# Patient Record
Sex: Male | Born: 1950
Health system: Southern US, Community
[De-identification: ages and names within clinical notes are randomized; demographics above are authoritative.]

## PROBLEM LIST (undated history)

## (undated) DIAGNOSIS — R001 Bradycardia, unspecified: Secondary | ICD-10-CM

## (undated) DIAGNOSIS — Z973 Presence of spectacles and contact lenses: Secondary | ICD-10-CM

## (undated) DIAGNOSIS — I10 Essential (primary) hypertension: Secondary | ICD-10-CM

## (undated) DIAGNOSIS — Z87828 Personal history of other (healed) physical injury and trauma: Secondary | ICD-10-CM

## (undated) DIAGNOSIS — Z951 Presence of aortocoronary bypass graft: Secondary | ICD-10-CM

## (undated) DIAGNOSIS — Z8249 Family history of ischemic heart disease and other diseases of the circulatory system: Secondary | ICD-10-CM

## (undated) DIAGNOSIS — I499 Cardiac arrhythmia, unspecified: Secondary | ICD-10-CM

## (undated) DIAGNOSIS — E7800 Pure hypercholesterolemia, unspecified: Secondary | ICD-10-CM

## (undated) DIAGNOSIS — Z974 Presence of external hearing-aid: Secondary | ICD-10-CM

## (undated) DIAGNOSIS — N201 Calculus of ureter: Secondary | ICD-10-CM

## (undated) DIAGNOSIS — N200 Calculus of kidney: Secondary | ICD-10-CM

## (undated) DIAGNOSIS — I2510 Atherosclerotic heart disease of native coronary artery without angina pectoris: Secondary | ICD-10-CM

## (undated) DIAGNOSIS — E119 Type 2 diabetes mellitus without complications: Secondary | ICD-10-CM

## (undated) DIAGNOSIS — Z87442 Personal history of urinary calculi: Secondary | ICD-10-CM

## (undated) HISTORY — DX: Atherosclerotic heart disease of native coronary artery without angina pectoris: I25.10

## (undated) HISTORY — PX: LAPAROSCOPIC APPENDECTOMY: SUR753

## (undated) HISTORY — DX: Family history of ischemic heart disease and other diseases of the circulatory system: Z82.49

## (undated) NOTE — *Deleted (*Deleted)
Called by central tele at 2003 becaseu PT had a run of about 20 VTAC. RN assessed pt.  Pt stated he felt no different.  Pt laying in bed comfortable.  Pt stated he had just rolled over a few seconds ago.

---
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
## 1999-10-20 ENCOUNTER — Ambulatory Visit (HOSPITAL_COMMUNITY): Admission: RE | Admit: 1999-10-20 | Discharge: 1999-10-20 | Payer: Self-pay | Admitting: Internal Medicine

## 2000-04-02 ENCOUNTER — Encounter: Payer: Self-pay | Admitting: *Deleted

## 2000-04-02 ENCOUNTER — Encounter: Admission: RE | Admit: 2000-04-02 | Discharge: 2000-04-02 | Payer: Self-pay | Admitting: *Deleted

## 2000-08-07 ENCOUNTER — Encounter: Admission: RE | Admit: 2000-08-07 | Discharge: 2000-08-07 | Payer: Self-pay

## 2001-06-19 ENCOUNTER — Ambulatory Visit (HOSPITAL_COMMUNITY): Admission: RE | Admit: 2001-06-19 | Discharge: 2001-06-19 | Payer: Self-pay | Admitting: Gastroenterology

## 2004-02-14 HISTORY — PX: UMBILICAL HERNIA REPAIR: SHX196

## 2005-02-13 HISTORY — PX: VARICOSE VEIN SURGERY: SHX832

## 2005-02-15 ENCOUNTER — Encounter: Admission: RE | Admit: 2005-02-15 | Discharge: 2005-02-15 | Payer: Self-pay | Admitting: Internal Medicine

## 2005-02-21 ENCOUNTER — Encounter: Admission: RE | Admit: 2005-02-21 | Discharge: 2005-02-21 | Payer: Self-pay | Admitting: Internal Medicine

## 2005-02-28 ENCOUNTER — Encounter: Admission: RE | Admit: 2005-02-28 | Discharge: 2005-02-28 | Payer: Self-pay | Admitting: Internal Medicine

## 2005-03-21 ENCOUNTER — Encounter: Admission: RE | Admit: 2005-03-21 | Discharge: 2005-03-21 | Payer: Self-pay | Admitting: Internal Medicine

## 2005-08-31 ENCOUNTER — Encounter: Admission: RE | Admit: 2005-08-31 | Discharge: 2005-08-31 | Payer: Self-pay | Admitting: Internal Medicine

## 2005-11-18 ENCOUNTER — Observation Stay (HOSPITAL_COMMUNITY): Admission: EM | Admit: 2005-11-18 | Discharge: 2005-11-18 | Payer: Self-pay | Admitting: Emergency Medicine

## 2005-11-18 ENCOUNTER — Encounter (INDEPENDENT_AMBULATORY_CARE_PROVIDER_SITE_OTHER): Payer: Self-pay | Admitting: Specialist

## 2007-03-16 ENCOUNTER — Emergency Department (HOSPITAL_COMMUNITY): Admission: EM | Admit: 2007-03-16 | Discharge: 2007-03-16 | Payer: Self-pay | Admitting: Emergency Medicine

## 2009-10-18 ENCOUNTER — Emergency Department (HOSPITAL_COMMUNITY): Admission: EM | Admit: 2009-10-18 | Discharge: 2009-10-18 | Payer: Self-pay | Admitting: Emergency Medicine
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
## 2010-07-01 NOTE — Procedures (Signed)
[place]  Patient:    JAXTON, CASALE Visit Number: [id number] MRN: [id number]          Service Type: END Location: ENDO Attending Physician:  Rich Brave Dictated by:   Florencia Reasons, M.D. Proc. Date: 06/19/01 Admit Date:  06/19/2001   CC:         Gaspar Garbe, M.D.   Procedure Report  PROCEDURE:  Colonoscopy.  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  FINDINGS:  Normal exam to the cecum.  DESCRIPTION OF PROCEDURE:  The nature, purpose, and risks of the procedure had been discussed with the patient, who provided written consent.  Sedation was fentanyl 100 mcg and Versed 10 mg IV without arrhythmias or desaturation. Digital exam showed a prolapsed internal hemorrhoid or external hemorrhoid, and a normal prostate.  The Olympus adult video colonoscope was advanced with some looping to the cecum, and pullback was then performed.  The quality of the prep was excellent, so it was felt that all areas were well-seen.  This was a normal examination.  No polyps, cancer, colitis, vascular malformations, or diverticulosis were noted.  No biopsies were obtained.  The patient tolerated the procedure well, and there were no apparent complications.  IMPRESSION:  Normal screening colonoscopy.  PLAN:  Consider follow-up colonoscopy in five years for screening purposes, versus sigmoidoscopic evaluation. Dictated by:   Florencia Reasons, M.D. Attending Physician:  Rich Brave DD:  06/19/01 TD:  06/20/01 Job: [id number]

## 2010-07-01 NOTE — Op Note (Signed)
Taylor [name], Taylor [name]              ACCOUNT NO.:  [id number]   MEDICAL RECORD NO.:  [id number]          PATIENT TYPE:  INP   LOCATION:  2550                         FACILITY:  [facility]   PHYSICIAN:  Cherylynn Ridges, M.D.    DATE OF BIRTH:  [birth year]   DATE OF PROCEDURE:  11/18/2005  DATE OF DISCHARGE:                                 OPERATIVE REPORT   PREOPERATIVE DIAGNOSIS:  Acute appendicitis.   POSTOPERATIVE DIAGNOSIS:  Acute appendicitis.   PROCEDURE:  Laparoscopic appendectomy.   SURGEON:  Marta Lamas. Lindie Spruce, M.D.   ANESTHESIA:  General endotracheal.   ESTIMATED BLOOD LOSS:  Less than 20 cubic centimeters.   COMPLICATIONS:  None.   CONDITION:  Stable.   FINDINGS:  Acute appendicitis without perforation and minimal separation.   INDICATIONS FOR OPERATION:  The patient is a 60 year old gentleman with  abdominal pain localized to the right lower quadrant and a CT showing acute  appendicitis.   OPERATION:  The patient was taken to the operating room and placed on the  table in the supine position.  After an adequate endotracheal anesthetic was  administered, he was prepped and draped in usual sterile manner, exposing  the midline and right upper quadrant.   The patient was noted to have a supraumbilical umbilical hernia.  A  longitudinal incision was made at the upper portion of the umbilicus and  taken down to the subcutaneous where the umbilical hernia defect was noted.  We dissected out this defect and then subsequently reduced back into the  preperitoneal plane.  We placed Kocher clamps upon the open area edges of  the hernia defect then bluntly dissected into the peritoneal cavity using  Kelly clamp.  We placed a pursestring suture of 0-Vicryl around the hernia  defect and then subsequently passed the Hassan cannula into the peritoneal  cavity.  Once the Roseanne Reno was freely in the peritoneal cavity, we insufflated  carbon dioxide gas into the peritoneal cavity up to maximal  pressure of 15  mmHg.  Once this was done, a right costal margin 5 mm cannula and a  suprapubic 12 mm cannula passed under direct vision into the peritoneal  cavity.  The patient was placed in Trendelenburg and the left-side was  tilted down and dissection begun.   The acutely inflamed and enlarged appendix was noted in the right lower  quadrant.  We were able to dissect it out at the base the cecum and free at  a window between the mesoappendix and the appendix using a dissector and  electrocautery.  We subsequently passed an Endo-GIA 3.5 mm closure stapler  along the base of the cecum and the appendix, transecting it.  We then used  a 2.5 mm closure Endo-GIA stapler across the mesoappendix, stapling it with  minimal bleeding.  We retrieved the appendix using an EndoCatch bag from the  suprapubic site with minimal difficulty.  We irrigated with less than a  liter of saline solution and found there to be adequate hemostasis at the  staple line and good apposition of tissues.  Once irrigated, we aspirated  all  fluid and gas from above the liver and pelvis, removed all cannulas, and  then closed the supraumbilical fascia site at the site hernia using the  pursestring suture of 0-Vicryl which was in place to secure the [facility]  cannula.  Once this was done, 0.25% Marcaine was injected all sites and the  skin was closed using running subcuticular stitch of 5-0 Vicryl.  A sterile  dressing was applied.  All needle counts, sponge counts, and instrument  counts were correct.      Cherylynn Ridges, M.D.  Electronically Signed     JOW/MEDQ  D:  11/18/2005  T:  11/19/2005  Job:  [id number]   cc:   Gaspar Garbe, M.D.

## 2010-11-03 LAB — I-STAT 8, (EC8 V) (CONVERTED LAB)
Acid-base deficit: 1
BUN: 24 — ABNORMAL HIGH
Bicarbonate: 24.9 — ABNORMAL HIGH
Chloride: 106
Glucose, Bld: 144 — ABNORMAL HIGH
HCT: 43
Hemoglobin: 14.6
Operator id: 151321
Potassium: 4.3
Sodium: 137
TCO2: 26
pCO2, Ven: 42.9 — ABNORMAL LOW
pH, Ven: 7.372 — ABNORMAL HIGH

## 2010-11-03 LAB — URINALYSIS, ROUTINE W REFLEX MICROSCOPIC
Bilirubin Urine: NEGATIVE
Glucose, UA: 100 — AB
Ketones, ur: NEGATIVE
Leukocytes, UA: NEGATIVE
Nitrite: NEGATIVE
Protein, ur: 30 — AB
Specific Gravity, Urine: 1.028
Urobilinogen, UA: 1
pH: 5

## 2010-11-03 LAB — URINE MICROSCOPIC-ADD ON

## 2010-11-03 LAB — POCT I-STAT CREATININE
Creatinine, Ser: 1.3
Operator id: 151321

## 2011-05-08 ENCOUNTER — Other Ambulatory Visit: Payer: Self-pay | Admitting: Internal Medicine

## 2011-05-08 DIAGNOSIS — I83811 Varicose veins of right lower extremities with pain: Secondary | ICD-10-CM

## 2011-05-23 ENCOUNTER — Ambulatory Visit
Admission: RE | Admit: 2011-05-23 | Discharge: 2011-05-23 | Disposition: A | Discharge status: Home / Self Care | Payer: BC Managed Care – PPO | Source: Ambulatory Visit | Attending: Internal Medicine | Admitting: Internal Medicine

## 2011-05-23 DIAGNOSIS — I83811 Varicose veins of right lower extremities with pain: Secondary | ICD-10-CM

## 2011-05-23 HISTORY — DX: Essential (primary) hypertension: I10

## 2011-05-23 HISTORY — DX: Pure hypercholesterolemia, unspecified: E78.00

## 2011-05-23 NOTE — Consult Note (Signed)
Reason for Consult:Right lower extremity swelling. Referring Physician: Rilee, Taylor [name] is an 61 y.o. male.  HPI: Known to our service from previous endovascular laser ablation of the refluxing left greater saphenous vein to treat symptomatic varicose veins, good response.  Now with increasing right lower extremity calf and ankle edema which increases over the course of the day.  No history of venous ulceration, DVT, thrombophlebitis, or PE.  No family history of varicose or spider veins.  Taylor [name] is using compression hose.  Taylor [name] works at [employer] full time, 8 hours day.  Not using pain medications.  There is minimal relief with leg elevation.  No current restrictions on activities of daily living or exercise.  Past Medical History  Diagnosis Date  . Diabetes mellitus   . Hypertension   . Hypercholesterolemia     Past Surgical History  Procedure Date  . Orchiectomy 1965  . Appendectomy   . Hernia repair   . Varicose vein surgery 2007    left endovascular laser ablation    Family History  Problem Relation Age of Onset  . Heart disease Other   . Stroke Other     Social History:  reports that Taylor [name] has never smoked. Taylor [name] has never used smokeless tobacco. Taylor [name] reports that Taylor [name] does not drink alcohol or use illicit drugs.  Allergies: No Known Allergies  Medications: I have reviewed the patient's current medications.  No results found for this or any previous visit (from the past 48 hour(s)).  US Venous Img Lower Unilateral Right  05/23/2011  *RADIOLOGY REPORT*  Clinical data:  Previous endovascular laser ablation of symptomatic left lower extremity varicose veins.  New right leg swelling.  RIGHTLOWER EXTREMITY VENOUS DOPPLER ULTRASOUND  Technique:  Gray-scale sonography with compression as well as color and duplex Doppler ultrasound were performed to evaluate the deep venous system from the level of the common femoral vein through the popliteal and proximal calf  veins. Standing gray-scale sonography and duplex Doppler evaluation of the superficial venous system with augmentation was performed.  Findings:  The lower extremity deep venous system demonstrates normal compressibility, phasicity, and augmentation. No evidence of DVT.  There is valvular incompetence and reflux in the greater saphenous vein   from the saphenofemoral junction to the distal thigh.  The vein is not particularly dilated however.  The short saphenous vein is mildly dilated but demonstrates no valvular incompetence or reflux.  There are a few prominent tributaries from the short saphenous vein.  Subcutaneous edema is noted in the lower calf.  IMPRESSION  1.  Valvular incompetence and reflux in the right greater saphenous vein above the knee. 2.  Normal deep venous system.  Original Report Authenticated By: Osa Craver, M.D.    Review of Systems  HENT: Positive for tinnitus.   Respiratory: Negative.   Gastrointestinal: Negative.   Neurological: Negative.    5'11" 210lbs   Temp   Min: 98.1 F (36.7 C)   Max: 98.1 F (36.7 C)   Pulse Rate   Min: 62    Max: 62    Resp   Min: 15    Max: 15    BP: Systolic   Min: ! 149 mmHg   Max: ! 149 mmHg   BP: Diastolic   Min: 75 mmHg   Max: 75 mmHg   SpO2   Min: 96 %   Max: 96 %       Physical Exam  Constitutional: Taylor [name] is oriented  to person, place, and time. Taylor [name] appears well-developed and well-nourished. No distress.  HENT:  Head: Normocephalic.  Neck: No thyromegaly present.  Cardiovascular: Intact distal pulses.   Pulses:      Dorsalis pedis pulses are 3+ on the right side.       Posterior tibial pulses are 3+ on the right side.  Respiratory: Effort normal. No stridor. No respiratory distress.  GI: Taylor [name] exhibits no distension.  Musculoskeletal:       Right ankle: Taylor [name] exhibits swelling. Taylor [name] exhibits no ecchymosis, no deformity and normal pulse. no tenderness.  Neurological: Taylor [name] is alert and oriented to person, place, and  time. Coordination normal.  Skin: Skin is warm and dry. No ecchymosis, no lesion and no rash noted. Taylor [name] is not diaphoretic. No cyanosis or erythema. No pallor. Nails show no clubbing.  Psychiatric: Taylor [name] has a normal mood and affect. His behavior is normal. Judgment and thought content normal.    Assessment/Plan: My impression is that the reflux in the right greater saphenous vein  represents early presentation of the same sort of issues Taylor [name] had on the left, mainly progressive valvular incompetence, reflux, and symptomatic varicose veins with activity restrictions. As Taylor [name] is having early symptomatology I think it would be medically appropriate to go ahead and   approach this with endovascular ablation to prevent further exacerbation of symptoms.  We reviewed the endovascular laser ablation technique, anticipated benefits, possible risks and side effects, and alternatives.  Taylor [name] seemed to understand, and was motivated to proceed.  Therefore, we can set this up at his convenience pending carrier approval.  In the meantime, I have given him a prescription for new thigh-high graduated compression hose.  Taylor [name],Taylor [name] 05/23/2011, 9:49 AM

## 2011-05-24 ENCOUNTER — Telehealth: Payer: Self-pay | Admitting: Emergency Medicine

## 2011-05-24 NOTE — Telephone Encounter (Signed)
LMOVM FOR PT TO CALL BACK RE: INS. RESPONSE ON VARICOSE VEINS TREATMENT.   05-25-11- PT CALLED BACK AT 9:12AM AND WE WILL SET UP F/U APPT FOR EARLY AUG. FOR F/U CONSERVATIVE TX FOR VEINS THEN FILE W/ INS.

## 2011-08-29 ENCOUNTER — Other Ambulatory Visit: Payer: Self-pay | Admitting: Interventional Radiology

## 2011-08-29 DIAGNOSIS — I83899 Varicose veins of unspecified lower extremities with other complications: Secondary | ICD-10-CM

## 2011-09-13 ENCOUNTER — Other Ambulatory Visit: Payer: Self-pay | Admitting: Gastroenterology

## 2011-09-27 ENCOUNTER — Telehealth: Payer: Self-pay | Admitting: Emergency Medicine

## 2011-09-27 NOTE — Telephone Encounter (Signed)
CALLED PT TO SET UP F/U CONSERVATIVE TX FOR VARICOSE VEINS AND PT STATED THAT HIS LEG HAS BEEN DOING A LOT BETTER SINCE HE HAS BEEN WEARING THE COMPRESSION STOCKING AND HAS LESS SWELLING.  HE WILL CONTACT us IN THE FUTURE IF NEEDED OR IF SYMPTOMS ARISE.

## 2012-06-16 ENCOUNTER — Ambulatory Visit (INDEPENDENT_AMBULATORY_CARE_PROVIDER_SITE_OTHER): Payer: BC Managed Care – PPO | Admitting: Family Medicine

## 2012-06-16 ENCOUNTER — Ambulatory Visit: Payer: BC Managed Care – PPO

## 2012-06-16 VITALS — BP 169/80 | HR 54 | Temp 97.9°F | Resp 18 | Ht 71.0 in | Wt 216.0 lb

## 2012-06-16 DIAGNOSIS — M25475 Effusion, left foot: Secondary | ICD-10-CM

## 2012-06-16 DIAGNOSIS — M25519 Pain in unspecified shoulder: Secondary | ICD-10-CM

## 2012-06-16 DIAGNOSIS — M25473 Effusion, unspecified ankle: Secondary | ICD-10-CM

## 2012-06-16 DIAGNOSIS — S9032XA Contusion of left foot, initial encounter: Secondary | ICD-10-CM

## 2012-06-16 DIAGNOSIS — M25572 Pain in left ankle and joints of left foot: Secondary | ICD-10-CM

## 2012-06-16 DIAGNOSIS — I10 Essential (primary) hypertension: Secondary | ICD-10-CM

## 2012-06-16 DIAGNOSIS — M25476 Effusion, unspecified foot: Secondary | ICD-10-CM

## 2012-06-16 NOTE — Addendum Note (Signed)
Addended by: Neva Seat, Kiera Hussey R on: 06/16/2012 11:11 AM   Modules accepted: Level of Service

## 2012-06-16 NOTE — Progress Notes (Signed)
  Subjective:    Patient ID: Taylor [name], male    DOB: 28-Sep-1950, 62 y.o.   MRN: [id number]  HPI Taylor [name] is a 62 y.o. male Hx of HTN, DM2 - BP usually controlled.   L foot pain - top of L foot, unknown injury.  May have bumped it or dropped something on it.  No hx of gout. More sore. No f/c, min redness on top. Able to WB, no attempted tx's.   SH: works at [employer] - walk 8 hrs per day on concrete.   Review of Systems  Constitutional: Negative for fever and chills.  Respiratory: Negative for chest tightness and shortness of breath.   Cardiovascular: Negative for chest pain.  Musculoskeletal: Positive for myalgias, joint swelling and arthralgias.  Skin: Positive for color change (bruising into 2-3rd toes 1 week ago. ).  Neurological: Negative for weakness.       Objective:   Physical Exam  Vitals reviewed. Constitutional: He appears well-developed and well-nourished. No distress.  Cardiovascular: Normal rate, regular rhythm, normal heart sounds and intact distal pulses.   Pulmonary/Chest: Effort normal and breath sounds normal.  Musculoskeletal:       Left foot: He exhibits tenderness and swelling. He exhibits normal range of motion and normal capillary refill.       Feet:  Skin: Skin is warm, dry and intact.     [facility] reading (PRIMARY) by  Dr. Neva Seat - dorsal foot slight sts, no apparent fx.      Assessment & Plan:  Taylor [name] is a 62 y.o. male  Pain in joint, ankle and foot, left - Plan: DG Foot Complete Left  Swelling of foot joint, left - Plan: DG Foot Complete Left  Contusion, foot, left, initial encounter  HTN (hypertension)  L foot pain, swelling over proximal 3-4th metatarsals for past week. DDX contusion, vs. Stress injury, but in discussion has not changed activity. tylenol otc prn, or low dose advil if needed.   Post op shoe for 10-14 days, then recheck if pain not resolved. Relative rest, oow today (scheduled out next 2 days).  rtc  precautions.   HTN - prior controlled.   See below.   Patient Instructions  Post op shoe (or stiff hiking boot/work boot) for next 2 weeks, then recheck if not resolved.  Limit activity on this foot as able next 2 weeks. Return to the clinic or go to the nearest emergency room if any of your symptoms worsen or new symptoms occur.  Keep a record of your blood pressures outside of the office and follow up with your primary provider if remaining above 140/90.

## 2012-06-16 NOTE — Patient Instructions (Addendum)
Post op shoe (or stiff hiking boot/work boot) for next 2 weeks, then recheck if not resolved.  Limit activity on this foot as able next 2 weeks. Return to the clinic or go to the nearest emergency room if any of your symptoms worsen or new symptoms occur.  Keep a record of your blood pressures outside of the office and follow up with your primary provider if remaining above 140/90.

## 2012-07-02 ENCOUNTER — Other Ambulatory Visit: Payer: Self-pay | Admitting: Podiatry

## 2012-07-02 DIAGNOSIS — D492 Neoplasm of unspecified behavior of bone, soft tissue, and skin: Secondary | ICD-10-CM

## 2012-07-03 LAB — CREATININE, SERUM: Creat: 1.05 mg/dL (ref 0.50–1.35)

## 2012-07-05 ENCOUNTER — Ambulatory Visit
Admission: RE | Admit: 2012-07-05 | Discharge: 2012-07-05 | Disposition: A | Discharge status: Home / Self Care | Payer: BC Managed Care – PPO | Source: Ambulatory Visit | Attending: Podiatry | Admitting: Podiatry

## 2012-07-05 DIAGNOSIS — D492 Neoplasm of unspecified behavior of bone, soft tissue, and skin: Secondary | ICD-10-CM

## 2012-07-05 MED ORDER — GADOBENATE DIMEGLUMINE 529 MG/ML IV SOLN
10.0000 mL | Freq: Once | INTRAVENOUS | Status: AC | PRN
  Administered 2012-07-05: 10 mL via INTRAVENOUS

## 2013-08-04 ENCOUNTER — Emergency Department (HOSPITAL_COMMUNITY)
Admission: EM | Admit: 2013-08-04 | Discharge: 2013-08-04 | Disposition: A | Discharge status: Home / Self Care | Payer: Worker's Compensation | Attending: Emergency Medicine | Admitting: Emergency Medicine

## 2013-08-04 ENCOUNTER — Encounter (HOSPITAL_COMMUNITY): Payer: Self-pay | Admitting: Emergency Medicine

## 2013-08-04 ENCOUNTER — Emergency Department (HOSPITAL_COMMUNITY): Payer: Worker's Compensation

## 2013-08-04 DIAGNOSIS — Z7982 Long term (current) use of aspirin: Secondary | ICD-10-CM | POA: Diagnosis not present

## 2013-08-04 DIAGNOSIS — E119 Type 2 diabetes mellitus without complications: Secondary | ICD-10-CM | POA: Diagnosis not present

## 2013-08-04 DIAGNOSIS — E7800 Pure hypercholesterolemia, unspecified: Secondary | ICD-10-CM | POA: Insufficient documentation

## 2013-08-04 DIAGNOSIS — S0003XA Contusion of scalp, initial encounter: Secondary | ICD-10-CM | POA: Insufficient documentation

## 2013-08-04 DIAGNOSIS — Y9289 Other specified places as the place of occurrence of the external cause: Secondary | ICD-10-CM | POA: Diagnosis not present

## 2013-08-04 DIAGNOSIS — Z79899 Other long term (current) drug therapy: Secondary | ICD-10-CM | POA: Diagnosis not present

## 2013-08-04 DIAGNOSIS — S1093XA Contusion of unspecified part of neck, initial encounter: Secondary | ICD-10-CM

## 2013-08-04 DIAGNOSIS — W1809XA Striking against other object with subsequent fall, initial encounter: Secondary | ICD-10-CM | POA: Diagnosis not present

## 2013-08-04 DIAGNOSIS — S06339A Contusion and laceration of cerebrum, unspecified, with loss of consciousness of unspecified duration, initial encounter: Secondary | ICD-10-CM | POA: Diagnosis not present

## 2013-08-04 DIAGNOSIS — S0083XA Contusion of other part of head, initial encounter: Secondary | ICD-10-CM | POA: Diagnosis not present

## 2013-08-04 DIAGNOSIS — S0990XA Unspecified injury of head, initial encounter: Secondary | ICD-10-CM | POA: Diagnosis present

## 2013-08-04 DIAGNOSIS — S06331A Contusion and laceration of cerebrum, unspecified, with loss of consciousness of 30 minutes or less, initial encounter: Secondary | ICD-10-CM

## 2013-08-04 DIAGNOSIS — I10 Essential (primary) hypertension: Secondary | ICD-10-CM | POA: Diagnosis not present

## 2013-08-04 DIAGNOSIS — Y990 Civilian activity done for income or pay: Secondary | ICD-10-CM | POA: Diagnosis not present

## 2013-08-04 LAB — CBC WITH DIFFERENTIAL/PLATELET
Basophils Absolute: 0 10*3/uL (ref 0.0–0.1)
Basophils Relative: 0 % (ref 0–1)
Eosinophils Absolute: 0.1 10*3/uL (ref 0.0–0.7)
Eosinophils Relative: 1 % (ref 0–5)
HCT: 38.9 % — ABNORMAL LOW (ref 39.0–52.0)
Hemoglobin: 13.5 g/dL (ref 13.0–17.0)
Lymphocytes Relative: 25 % (ref 12–46)
Lymphs Abs: 1.8 10*3/uL (ref 0.7–4.0)
MCH: 29.2 pg (ref 26.0–34.0)
MCHC: 34.7 g/dL (ref 30.0–36.0)
MCV: 84.2 fL (ref 78.0–100.0)
Monocytes Absolute: 0.6 10*3/uL (ref 0.1–1.0)
Monocytes Relative: 8 % (ref 3–12)
Neutro Abs: 4.7 10*3/uL (ref 1.7–7.7)
Neutrophils Relative %: 66 % (ref 43–77)
Platelets: 236 10*3/uL (ref 150–400)
RBC: 4.62 MIL/uL (ref 4.22–5.81)
RDW: 12.6 % (ref 11.5–15.5)
WBC: 7.2 10*3/uL (ref 4.0–10.5)

## 2013-08-04 LAB — BASIC METABOLIC PANEL
BUN: 25 mg/dL — ABNORMAL HIGH (ref 6–23)
CO2: 25 mEq/L (ref 19–32)
Calcium: 9.2 mg/dL (ref 8.4–10.5)
Chloride: 101 mEq/L (ref 96–112)
Creatinine, Ser: 1.08 mg/dL (ref 0.50–1.35)
GFR calc Af Amer: 82 mL/min — ABNORMAL LOW (ref >90)
GFR calc non Af Amer: 71 mL/min — ABNORMAL LOW (ref >90)
Glucose, Bld: 127 mg/dL — ABNORMAL HIGH (ref 70–99)
Potassium: 4.8 mEq/L (ref 3.7–5.3)
Sodium: 138 mEq/L (ref 137–147)

## 2013-08-04 MED ORDER — METHYLPREDNISOLONE 4 MG PO KIT: PACK | ORAL | Status: DC

## 2013-08-04 NOTE — ED Provider Notes (Signed)
CSN: [id number]     Arrival date & time 08/04/13  1700 History   First MD Initiated Contact with Patient 08/04/13 2030     Chief Complaint  Patient presents with  . Headache  . Concussion     (Consider location/radiation/quality/duration/timing/severity/associated sxs/prior Treatment) HPI Comments: Patient on baby aspirin daily -- presents 9 days after a head injury. Patient's was knocked to the floor after being struck with falling fencing panels. He struck the back of his head on a concrete floor and was knocked out for at least 10 minutes. Patient was evaluated by EMS and he refused transport. Patient had constant headache for several days after the injury. Headache became more intermittent and has not changed in severity. He has not had blurry vision, vomiting, weakness/numbness/tingling in his arms or his legs. No abdominal pain or chest pain. No treatments prior to arrival. Patient was seen by a doctor associated with his job today and was sent to the emergency department for imaging given persistent headaches. The onset of this condition was acute. The course is gradually improving but slowly. Aggravating factors: none. Alleviating factors: none.    The history is provided by the patient.    Past Medical History  Diagnosis Date  . Diabetes mellitus   . Hypertension   . Hypercholesterolemia    Past Surgical History  Procedure Laterality Date  . Orchiectomy  1965  . Appendectomy    . Hernia repair    . Varicose vein surgery  2007    left endovascular laser ablation   Family History  Problem Relation Age of Onset  . Heart disease Other   . Stroke Other   . Stroke Mother   . Heart disease Father   . Diabetes Daughter    History  Substance Use Topics  . Smoking status: Never Smoker   . Smokeless tobacco: Never Used  . Alcohol Use: No    Review of Systems  Constitutional: Negative for fatigue.  HENT: Negative for tinnitus.   Eyes: Negative for photophobia, pain and  visual disturbance.  Respiratory: Negative for shortness of breath.   Cardiovascular: Negative for chest pain.  Gastrointestinal: Negative for nausea and vomiting.  Musculoskeletal: Negative for back pain, gait problem and neck pain.  Skin: Negative for wound.  Neurological: Positive for headaches. Negative for dizziness, weakness, light-headedness and numbness.  Psychiatric/Behavioral: Negative for confusion and decreased concentration.      Allergies  Review of patient's allergies indicates no known allergies.  Home Medications   Prior to Admission medications   Medication Sig Start Date End Date Taking? Authorizing Provider  aspirin 81 MG tablet Take 81 mg by mouth every evening.    Yes Historical Provider, MD  atorvastatin (LIPITOR) 20 MG tablet Take 20 mg by mouth every evening.    Yes Historical Provider, MD  lisinopril (PRINIVIL,ZESTRIL) 20 MG tablet Take 20 mg by mouth every evening.    Yes Historical Provider, MD  sitaGLIPtan-metformin (JANUMET) 50-500 MG per tablet Take 1 tablet by mouth at bedtime.    Yes Historical Provider, MD   BP 154/70  Pulse 56  Temp(Src) 98 F (36.7 C)  Resp 14  SpO2 100%  Physical Exam  Nursing note and vitals reviewed. Constitutional: He is oriented to person, place, and time. He appears well-developed and well-nourished.  HENT:  Head: Normocephalic and atraumatic. Head is without raccoon's eyes and without Battle's sign.  Right Ear: Tympanic membrane, external ear and ear canal normal. No hemotympanum.  Left Ear: Tympanic  membrane, external ear and ear canal normal. No hemotympanum.  Nose: Nose normal. No nasal septal hematoma.  Mouth/Throat: Oropharynx is clear and moist.  Old periorbital contusion L face, no point tenderness.   Eyes: Conjunctivae, EOM and lids are normal. Pupils are equal, round, and reactive to light.  No visible hyphema  Neck: Normal range of motion. Neck supple.  Cardiovascular: Normal rate and regular rhythm.    Pulmonary/Chest: Effort normal and breath sounds normal.  Abdominal: Soft. There is no tenderness.  Musculoskeletal: Normal range of motion.       Cervical back: He exhibits normal range of motion, no tenderness and no bony tenderness.       Thoracic back: He exhibits no tenderness and no bony tenderness.       Lumbar back: He exhibits no tenderness and no bony tenderness.  Neurological: He is alert and oriented to person, place, and time. He has normal strength and normal reflexes. No cranial nerve deficit or sensory deficit. Coordination normal. GCS eye subscore is 4. GCS verbal subscore is 5. GCS motor subscore is 6.  Skin: Skin is warm and dry.  Psychiatric: He has a normal mood and affect.    ED Course  Procedures (including critical care time) Labs Review Labs Reviewed  CBC WITH DIFFERENTIAL - Abnormal; Notable for the following:    HCT 38.9 (*)    All other components within normal limits  BASIC METABOLIC PANEL - Abnormal; Notable for the following:    Glucose, Bld 127 (*)    BUN 25 (*)    GFR calc non Af Amer 71 (*)    GFR calc Af Amer 82 (*)    All other components within normal limits    Imaging Review Ct Head Wo Contrast  08/04/2013   CLINICAL DATA:  Fall 9 days ago.  Headache.  EXAM: CT HEAD WITHOUT CONTRAST  TECHNIQUE: Contiguous axial images were obtained from the base of the skull through the vertex without intravenous contrast.  COMPARISON:  None.  FINDINGS: Hemorrhagic contusions are present inferiorly in both frontal lobes. On the left, associated blood products measure 5.2 x 2.9 cm with surrounding vasogenic edema and effacement of sulci. On the right, the dominant hemorrhagic contusion measures approximately 1.8 x 1.1 cm, with a milder degree of vasogenic edema and sulcal effacement. This about 5 mm of right-to-left midline shift anteriorly.  The brainstem, cerebellum, cerebral peduncle is, thalami, basal ganglia appear unremarkable. Ventricular system unremarkable.   It is difficult to completely exclude the possibility of a small amount of extra-axial hemorrhage along the left frontal lobe given the degree of sulcal effacement.  On image 29 of series 2, a small hyperdensity along the left parietal lobe measures 0.6 by 0.4 cm, possibly a small cortical hemorrhagic contusion.  IMPRESSION: 1. Hemorrhagic contusions inferiorly in the frontal lobes, with the collection of left blood products measuring 5.2 x 2.9 cm. There is considerable associated effacement of the left frontal sulci and associated vasogenic edema. Difficulty to completely exclude a tiny amount of extra-axial hemorrhage along the left frontal lobe. 0.5 cm left-to-right midline shift anteriorly. 2. Small hyperdensity along the left parietal cortex on a single image, possibly representing a small hemorrhagic contusion.   Electronically Signed   By: Sherryl Barters M.D.   On: 08/04/2013 21:34     EKG Interpretation None      9:38 PM Patient seen and examined. Work-up initiated.    Vital signs reviewed and are as follows: Filed Vitals:  08/04/13 2002  BP: 154/70  Pulse: 56  Temp:   Resp: 14   Spoke with radiologist regarding CT.   Reviewed with Dr. Venora Maples. Reviewed with Dr. Saintclair Halsted who is going to review imaging. Patient stable in ED. He does not want any medication. Pt to call office tomorrow for appointment.   11:01 PM Dr. Saintclair Halsted agrees with discharge to home. Reccs Medrol dose pack. DM well-controlled. Pt informed and he agrees.  Discussed: no return to work until cleared by Dr. Saintclair Halsted, d/c medrol if CBG > 300, no ASA until cleared by Dr. Saintclair Halsted, PCP f/u as needed. No NSAIDs.   Patient counseled to return immediately if they have weakness in their arms or legs, slurred speech, trouble walking or talking, confusion, trouble with their balance, or if they have any other concerns. Patient verbalizes understanding and agrees with plan.   MDM   Final diagnoses:  Contusion of brain, unspecified  laterality, with loss of consciousness of 30 minutes or less, initial encounter   Patients with hemorrhagic contusion of brain, 9 days out from initial injury. Symptoms are not becoming acutely worse and are stable to slightly improving over this time frame. Patient has proven stability of bleed given this. Neurosurgery consultation obtained. Neurosurgery followup obtained. Patient counseled on signs and symptoms to return. He will have close neurosurgery followup as outpatient. Discussed recommendations as above.  No dangerous or life-threatening conditions suspected or identified by history, physical exam, and by work-up. No indications for hospitalization identified.     Carlisle Cater, PA-C 08/04/13 2305

## 2013-08-04 NOTE — ED Provider Notes (Signed)
Medical screening examination/treatment/procedure(s) were conducted as a shared visit with non-physician practitioner(s) and myself.  I personally evaluated the patient during the encounter.  Patient is 9 days out from his head injury.  His GCS is 15.  He is well appearing.  He has no weakness of his arms or legs.  He is alert and oriented x3.  No use of anticoagulants.  He is no longer taking his aspirin.  Patient has proven stability of his head injury.  Case was discussed with Dr. Saintclair Halsted by my physician assistant who personally reviewed the images    Ct Head Wo Contrast  08/04/2013   CLINICAL DATA:  Fall 9 days ago.  Headache.  EXAM: CT HEAD WITHOUT CONTRAST  TECHNIQUE: Contiguous axial images were obtained from the base of the skull through the vertex without intravenous contrast.  COMPARISON:  None.  FINDINGS: Hemorrhagic contusions are present inferiorly in both frontal lobes. On the left, associated blood products measure 5.2 x 2.9 cm with surrounding vasogenic edema and effacement of sulci. On the right, the dominant hemorrhagic contusion measures approximately 1.8 x 1.1 cm, with a milder degree of vasogenic edema and sulcal effacement. This about 5 mm of right-to-left midline shift anteriorly.  The brainstem, cerebellum, cerebral peduncle is, thalami, basal ganglia appear unremarkable. Ventricular system unremarkable.  It is difficult to completely exclude the possibility of a small amount of extra-axial hemorrhage along the left frontal lobe given the degree of sulcal effacement.  On image 29 of series 2, a small hyperdensity along the left parietal lobe measures 0.6 by 0.4 cm, possibly a small cortical hemorrhagic contusion.  IMPRESSION: 1. Hemorrhagic contusions inferiorly in the frontal lobes, with the collection of left blood products measuring 5.2 x 2.9 cm. There is considerable associated effacement of the left frontal sulci and associated vasogenic edema. Difficulty to completely exclude a tiny  amount of extra-axial hemorrhage along the left frontal lobe. 0.5 cm left-to-right midline shift anteriorly. 2. Small hyperdensity along the left parietal cortex on a single image, possibly representing a small hemorrhagic contusion.   Electronically Signed   By: Sherryl Barters M.D.   On: 08/04/2013 21:34  I personally reviewed the imaging tests through PACS system I reviewed available ER/hospitalization records through the [place], MD 08/04/13 2322

## 2013-08-04 NOTE — ED Notes (Signed)
Patient is alert and oriented x3.  He was given DC instructions and follow up visit instructions.  Patient gave verbal understanding.  He was DC ambulatory under his own power to home.  V/S stable.  He was not showing any signs of distress on DC 

## 2013-08-04 NOTE — ED Notes (Signed)
Pt has a small knot to the back of the head with a healed laceration. Pt has bilateral black eyes with the left being greater than the right.

## 2013-08-04 NOTE — ED Notes (Signed)
Pt was hit in head with concrete floor nine days ago, work related injury, told pt had to get CT of head in order to return. Pt c/o off and on headache since then. Denies blurred vision, n/v.

## 2013-08-04 NOTE — Discharge Instructions (Signed)
Please read and follow all provided instructions.  Your diagnoses today include:  1. Contusion of brain, unspecified laterality, with loss of consciousness of 30 minutes or less, initial encounter     Tests performed today include:  CT scan of head which shows significant bruising in the front of the brain  Blood counts and electrolytes  Vital signs. See below for your results today.   Medications prescribed:   Medrol dose pack - steroid medicine   It is best to take this medication in the morning to prevent sleeping problems. If you are diabetic, monitor your blood sugar closely and stop taking this medication if blood sugar is over 300. Take with food to prevent stomach upset.   Take any prescribed medications only as directed.  Home care instructions:  Follow any educational materials contained in this packet.  Do not take any medications containing aspirin until told to do so by Dr. Saintclair Halsted.  Do not return to work until you are cleared to do so by Dr. Saintclair Halsted.    Follow-up instructions: Please follow-up with Dr. Saintclair Halsted this week. Call tomorrow for an appointment.   Return instructions:  SEEK IMMEDIATE MEDICAL ATTENTION IF:  There is confusion or drowsiness (although children frequently become drowsy after injury).   You cannot awaken the injured person.   You have more than one episode of vomiting.   You notice dizziness or unsteadiness which is getting worse, or inability to walk.   You have convulsions or unconsciousness.   You experience severe, persistent headaches not relieved by Tylenol.  You cannot use arms or legs normally.   There are changes in pupil sizes. (This is the black center in the colored part of the eye)   There is clear or bloody discharge from the nose or ears.   You have change in speech, vision, swallowing, or understanding.   Localized weakness, numbness, tingling, or change in bowel or bladder control.  You have any other emergent  concerns.  Additional Information: You have had a head injury which is stable and does appear to require admission at this time, but will need close follow-up.   Your vital signs today were: BP 154/70   Pulse 56   Temp(Src) 98 F (36.7 C)   Resp 14   SpO2 100% If your blood pressure (BP) was elevated above 135/85 this visit, please have this repeated by your doctor within one month. --------------

## 2016-06-06 DIAGNOSIS — E7800 Pure hypercholesterolemia, unspecified: Secondary | ICD-10-CM | POA: Diagnosis not present

## 2016-06-06 DIAGNOSIS — E119 Type 2 diabetes mellitus without complications: Secondary | ICD-10-CM | POA: Diagnosis not present

## 2016-06-06 DIAGNOSIS — Z125 Encounter for screening for malignant neoplasm of prostate: Secondary | ICD-10-CM | POA: Diagnosis not present

## 2016-06-06 DIAGNOSIS — R8299 Other abnormal findings in urine: Secondary | ICD-10-CM | POA: Diagnosis not present

## 2016-06-06 DIAGNOSIS — I10 Essential (primary) hypertension: Secondary | ICD-10-CM | POA: Diagnosis not present

## 2016-06-13 DIAGNOSIS — N183 Chronic kidney disease, stage 3 (moderate): Secondary | ICD-10-CM | POA: Diagnosis not present

## 2016-06-13 DIAGNOSIS — E7800 Pure hypercholesterolemia, unspecified: Secondary | ICD-10-CM | POA: Diagnosis not present

## 2016-06-13 DIAGNOSIS — Z0000 Encounter for general adult medical examination without abnormal findings: Secondary | ICD-10-CM | POA: Diagnosis not present

## 2016-06-13 DIAGNOSIS — I10 Essential (primary) hypertension: Secondary | ICD-10-CM | POA: Diagnosis not present

## 2016-06-13 DIAGNOSIS — I8390 Asymptomatic varicose veins of unspecified lower extremity: Secondary | ICD-10-CM | POA: Diagnosis not present

## 2016-06-13 DIAGNOSIS — D692 Other nonthrombocytopenic purpura: Secondary | ICD-10-CM | POA: Diagnosis not present

## 2016-06-13 DIAGNOSIS — Z6830 Body mass index (BMI) 30.0-30.9, adult: Secondary | ICD-10-CM | POA: Diagnosis not present

## 2016-06-13 DIAGNOSIS — R808 Other proteinuria: Secondary | ICD-10-CM | POA: Diagnosis not present

## 2016-06-13 DIAGNOSIS — Z23 Encounter for immunization: Secondary | ICD-10-CM | POA: Diagnosis not present

## 2016-06-13 DIAGNOSIS — E1129 Type 2 diabetes mellitus with other diabetic kidney complication: Secondary | ICD-10-CM | POA: Diagnosis not present

## 2016-06-13 DIAGNOSIS — Z1389 Encounter for screening for other disorder: Secondary | ICD-10-CM | POA: Diagnosis not present

## 2016-06-13 DIAGNOSIS — D126 Benign neoplasm of colon, unspecified: Secondary | ICD-10-CM | POA: Diagnosis not present

## 2016-06-19 DIAGNOSIS — Z1212 Encounter for screening for malignant neoplasm of rectum: Secondary | ICD-10-CM | POA: Diagnosis not present

## 2016-06-26 DIAGNOSIS — M7662 Achilles tendinitis, left leg: Secondary | ICD-10-CM | POA: Diagnosis not present

## 2016-06-26 DIAGNOSIS — M25572 Pain in left ankle and joints of left foot: Secondary | ICD-10-CM | POA: Diagnosis not present

## 2016-09-05 DIAGNOSIS — Z86010 Personal history of colonic polyps: Secondary | ICD-10-CM | POA: Diagnosis not present

## 2016-11-24 DIAGNOSIS — E119 Type 2 diabetes mellitus without complications: Secondary | ICD-10-CM | POA: Diagnosis not present

## 2016-11-24 DIAGNOSIS — H5203 Hypermetropia, bilateral: Secondary | ICD-10-CM | POA: Diagnosis not present

## 2016-11-24 DIAGNOSIS — H2513 Age-related nuclear cataract, bilateral: Secondary | ICD-10-CM | POA: Diagnosis not present

## 2016-11-24 DIAGNOSIS — H524 Presbyopia: Secondary | ICD-10-CM | POA: Diagnosis not present

## 2016-12-13 DIAGNOSIS — N183 Chronic kidney disease, stage 3 (moderate): Secondary | ICD-10-CM | POA: Diagnosis not present

## 2016-12-13 DIAGNOSIS — E668 Other obesity: Secondary | ICD-10-CM | POA: Diagnosis not present

## 2016-12-13 DIAGNOSIS — D126 Benign neoplasm of colon, unspecified: Secondary | ICD-10-CM | POA: Diagnosis not present

## 2016-12-13 DIAGNOSIS — D692 Other nonthrombocytopenic purpura: Secondary | ICD-10-CM | POA: Diagnosis not present

## 2016-12-13 DIAGNOSIS — I10 Essential (primary) hypertension: Secondary | ICD-10-CM | POA: Diagnosis not present

## 2016-12-13 DIAGNOSIS — Z23 Encounter for immunization: Secondary | ICD-10-CM | POA: Diagnosis not present

## 2016-12-13 DIAGNOSIS — Z6830 Body mass index (BMI) 30.0-30.9, adult: Secondary | ICD-10-CM | POA: Diagnosis not present

## 2016-12-13 DIAGNOSIS — E7800 Pure hypercholesterolemia, unspecified: Secondary | ICD-10-CM | POA: Diagnosis not present

## 2016-12-13 DIAGNOSIS — E1129 Type 2 diabetes mellitus with other diabetic kidney complication: Secondary | ICD-10-CM | POA: Diagnosis not present

## 2016-12-13 DIAGNOSIS — R808 Other proteinuria: Secondary | ICD-10-CM | POA: Diagnosis not present

## 2016-12-13 DIAGNOSIS — I8390 Asymptomatic varicose veins of unspecified lower extremity: Secondary | ICD-10-CM | POA: Diagnosis not present

## 2017-01-19 DIAGNOSIS — R05 Cough: Secondary | ICD-10-CM | POA: Diagnosis not present

## 2017-01-19 DIAGNOSIS — R52 Pain, unspecified: Secondary | ICD-10-CM | POA: Diagnosis not present

## 2017-01-19 DIAGNOSIS — Z6831 Body mass index (BMI) 31.0-31.9, adult: Secondary | ICD-10-CM | POA: Diagnosis not present

## 2017-01-19 DIAGNOSIS — J069 Acute upper respiratory infection, unspecified: Secondary | ICD-10-CM | POA: Diagnosis not present

## 2017-01-25 DIAGNOSIS — L814 Other melanin hyperpigmentation: Secondary | ICD-10-CM | POA: Diagnosis not present

## 2017-01-25 DIAGNOSIS — L570 Actinic keratosis: Secondary | ICD-10-CM | POA: Diagnosis not present

## 2017-01-25 DIAGNOSIS — L821 Other seborrheic keratosis: Secondary | ICD-10-CM | POA: Diagnosis not present

## 2017-01-25 DIAGNOSIS — D225 Melanocytic nevi of trunk: Secondary | ICD-10-CM | POA: Diagnosis not present

## 2017-05-29 DIAGNOSIS — L570 Actinic keratosis: Secondary | ICD-10-CM | POA: Diagnosis not present

## 2017-05-29 DIAGNOSIS — M71342 Other bursal cyst, left hand: Secondary | ICD-10-CM | POA: Diagnosis not present

## 2017-06-13 DIAGNOSIS — R82998 Other abnormal findings in urine: Secondary | ICD-10-CM | POA: Diagnosis not present

## 2017-06-13 DIAGNOSIS — N183 Chronic kidney disease, stage 3 (moderate): Secondary | ICD-10-CM | POA: Diagnosis not present

## 2017-06-13 DIAGNOSIS — Z125 Encounter for screening for malignant neoplasm of prostate: Secondary | ICD-10-CM | POA: Diagnosis not present

## 2017-06-13 DIAGNOSIS — E1129 Type 2 diabetes mellitus with other diabetic kidney complication: Secondary | ICD-10-CM | POA: Diagnosis not present

## 2017-06-13 DIAGNOSIS — E7800 Pure hypercholesterolemia, unspecified: Secondary | ICD-10-CM | POA: Diagnosis not present

## 2017-06-20 DIAGNOSIS — Z0000 Encounter for general adult medical examination without abnormal findings: Secondary | ICD-10-CM | POA: Diagnosis not present

## 2017-06-20 DIAGNOSIS — Z6831 Body mass index (BMI) 31.0-31.9, adult: Secondary | ICD-10-CM | POA: Diagnosis not present

## 2017-06-20 DIAGNOSIS — Z1389 Encounter for screening for other disorder: Secondary | ICD-10-CM | POA: Diagnosis not present

## 2017-06-20 DIAGNOSIS — E1129 Type 2 diabetes mellitus with other diabetic kidney complication: Secondary | ICD-10-CM | POA: Diagnosis not present

## 2017-06-20 DIAGNOSIS — D126 Benign neoplasm of colon, unspecified: Secondary | ICD-10-CM | POA: Diagnosis not present

## 2017-06-20 DIAGNOSIS — N183 Chronic kidney disease, stage 3 (moderate): Secondary | ICD-10-CM | POA: Diagnosis not present

## 2017-06-20 DIAGNOSIS — E668 Other obesity: Secondary | ICD-10-CM | POA: Diagnosis not present

## 2017-06-20 DIAGNOSIS — I8390 Asymptomatic varicose veins of unspecified lower extremity: Secondary | ICD-10-CM | POA: Diagnosis not present

## 2017-06-20 DIAGNOSIS — E7800 Pure hypercholesterolemia, unspecified: Secondary | ICD-10-CM | POA: Diagnosis not present

## 2017-06-20 DIAGNOSIS — R809 Proteinuria, unspecified: Secondary | ICD-10-CM | POA: Diagnosis not present

## 2017-06-20 DIAGNOSIS — E1165 Type 2 diabetes mellitus with hyperglycemia: Secondary | ICD-10-CM | POA: Diagnosis not present

## 2017-06-20 DIAGNOSIS — I10 Essential (primary) hypertension: Secondary | ICD-10-CM | POA: Diagnosis not present

## 2017-06-27 DIAGNOSIS — Z1212 Encounter for screening for malignant neoplasm of rectum: Secondary | ICD-10-CM | POA: Diagnosis not present

## 2017-07-11 DIAGNOSIS — R2232 Localized swelling, mass and lump, left upper limb: Secondary | ICD-10-CM | POA: Diagnosis not present

## 2017-07-11 DIAGNOSIS — M79642 Pain in left hand: Secondary | ICD-10-CM | POA: Diagnosis not present

## 2017-07-23 DIAGNOSIS — R3129 Other microscopic hematuria: Secondary | ICD-10-CM | POA: Diagnosis not present

## 2017-07-23 DIAGNOSIS — R1084 Generalized abdominal pain: Secondary | ICD-10-CM | POA: Diagnosis not present

## 2017-07-24 ENCOUNTER — Encounter (HOSPITAL_COMMUNITY): Payer: Self-pay | Admitting: Emergency Medicine

## 2017-07-24 ENCOUNTER — Emergency Department (HOSPITAL_COMMUNITY): Payer: PPO

## 2017-07-24 ENCOUNTER — Emergency Department (HOSPITAL_COMMUNITY)
Admission: EM | Admit: 2017-07-24 | Discharge: 2017-07-24 | Disposition: A | Discharge status: Home / Self Care | Payer: PPO | Attending: Emergency Medicine | Admitting: Emergency Medicine

## 2017-07-24 DIAGNOSIS — Z7984 Long term (current) use of oral hypoglycemic drugs: Secondary | ICD-10-CM | POA: Insufficient documentation

## 2017-07-24 DIAGNOSIS — I493 Ventricular premature depolarization: Secondary | ICD-10-CM | POA: Insufficient documentation

## 2017-07-24 DIAGNOSIS — N132 Hydronephrosis with renal and ureteral calculous obstruction: Secondary | ICD-10-CM | POA: Diagnosis not present

## 2017-07-24 DIAGNOSIS — Z7982 Long term (current) use of aspirin: Secondary | ICD-10-CM | POA: Diagnosis not present

## 2017-07-24 DIAGNOSIS — R001 Bradycardia, unspecified: Secondary | ICD-10-CM | POA: Diagnosis not present

## 2017-07-24 DIAGNOSIS — Z79899 Other long term (current) drug therapy: Secondary | ICD-10-CM | POA: Diagnosis not present

## 2017-07-24 DIAGNOSIS — E119 Type 2 diabetes mellitus without complications: Secondary | ICD-10-CM | POA: Insufficient documentation

## 2017-07-24 DIAGNOSIS — N23 Unspecified renal colic: Secondary | ICD-10-CM | POA: Insufficient documentation

## 2017-07-24 DIAGNOSIS — R109 Unspecified abdominal pain: Secondary | ICD-10-CM | POA: Diagnosis not present

## 2017-07-24 DIAGNOSIS — I10 Essential (primary) hypertension: Secondary | ICD-10-CM | POA: Diagnosis not present

## 2017-07-24 DIAGNOSIS — R1032 Left lower quadrant pain: Secondary | ICD-10-CM | POA: Diagnosis present

## 2017-07-24 DIAGNOSIS — N368 Other specified disorders of urethra: Secondary | ICD-10-CM | POA: Diagnosis not present

## 2017-07-24 LAB — I-STAT CHEM 8, ED
BUN: 23 mg/dL — ABNORMAL HIGH (ref 6–20)
Calcium, Ion: 1.16 mmol/L (ref 1.15–1.40)
Chloride: 101 mmol/L (ref 101–111)
Creatinine, Ser: 1.3 mg/dL — ABNORMAL HIGH (ref 0.61–1.24)
Glucose, Bld: 198 mg/dL — ABNORMAL HIGH (ref 65–99)
HCT: 43 % (ref 39.0–52.0)
Hemoglobin: 14.6 g/dL (ref 13.0–17.0)
Potassium: 4.1 mmol/L (ref 3.5–5.1)
Sodium: 139 mmol/L (ref 135–145)
TCO2: 25 mmol/L (ref 22–32)

## 2017-07-24 LAB — URINALYSIS, ROUTINE W REFLEX MICROSCOPIC
Bacteria, UA: NONE SEEN
Bilirubin Urine: NEGATIVE
Glucose, UA: >=500 mg/dL — AB
Ketones, ur: NEGATIVE mg/dL
Leukocytes, UA: NEGATIVE
Nitrite: NEGATIVE
Protein, ur: NEGATIVE mg/dL
Specific Gravity, Urine: 1.01 (ref 1.005–1.030)
pH: 5 (ref 5.0–8.0)

## 2017-07-24 MED ORDER — OXYCODONE-ACETAMINOPHEN 5-325 MG PO TABS
1.0000 | ORAL_TABLET | Freq: Once | ORAL | Status: AC
  Administered 2017-07-24: 1 via ORAL
  Filled 2017-07-24: qty 1

## 2017-07-24 MED ORDER — TAMSULOSIN HCL 0.4 MG PO CAPS: 0.4000 mg | ORAL_CAPSULE | Freq: Every day | ORAL | 0 refills | Status: DC

## 2017-07-24 MED ORDER — HYDROMORPHONE HCL 2 MG PO TABS
2.0000 mg | ORAL_TABLET | ORAL | 0 refills | Status: DC | PRN
Start: 2017-07-24 — End: 2017-08-31

## 2017-07-24 MED ORDER — KETOROLAC TROMETHAMINE 60 MG/2ML IM SOLN
30.0000 mg | Freq: Once | INTRAMUSCULAR | Status: AC
  Administered 2017-07-24: 30 mg via INTRAMUSCULAR
  Filled 2017-07-24: qty 2

## 2017-07-24 NOTE — ED Provider Notes (Signed)
[facility] EMERGENCY DEPARTMENT Provider Note   CSN: [id number] Arrival date & time: 07/24/17  0235     History   Chief Complaint Chief Complaint  Patient presents with  . Flank Pain    HPI Taylor [name] is a 67 y.o. male.  His prior history of diabetes hypertension and kidney stones here with acute left flank pain radiating to his left lower quadrant since noon yesterday.  He passes multiple stones and usually can pass medicine but the pain was more severe 9/10 associated with nausea.  There is been no fever.  He is tried lemonade to see if he can home passing stones.  He saw Dr. Gilford Rile yesterday.  Patient is also bradycardic here and he states he was found to have that and had an EKG 2 weeks ago and has a follow-up appoint with cardiology knowing he has a lot of PVCs.  Denies any chest pain shortness of breath lightheadedness syncope.  The history is provided by the patient.  Flank Pain  This is a recurrent problem. The current episode started yesterday. The problem occurs constantly. The problem has not changed since onset.Associated symptoms include abdominal pain. Pertinent negatives include no chest pain, no headaches and no shortness of breath. Nothing aggravates the symptoms. Nothing relieves the symptoms. He has tried rest for the symptoms. The treatment provided no relief.    Past Medical History:  Diagnosis Date  . Diabetes mellitus   . Hypercholesterolemia   . Hypertension   . Renal disorder    Kidney Stones    There are no active problems to display for this patient.   Past Surgical History:  Procedure Laterality Date  . APPENDECTOMY    . HERNIA REPAIR    . ORCHIECTOMY  1965  . VARICOSE VEIN SURGERY  2007   left endovascular laser ablation        Home Medications    Prior to Admission medications   Medication Sig Start Date End Date Taking? Authorizing Provider  aspirin 81 MG tablet Take 81 mg by mouth every evening.      [provider]  atorvastatin (LIPITOR) 20 MG tablet Take 20 mg by mouth every evening.     [provider]  lisinopril (PRINIVIL,ZESTRIL) 20 MG tablet Take 20 mg by mouth every evening.     [provider]  methylPREDNISolone (MEDROL DOSEPAK) 4 MG tablet Medrol dose pack 08/04/13   Carlisle Cater, PA-C  sitaGLIPtan-metformin (JANUMET) 50-500 MG per tablet Take 1 tablet by mouth at bedtime.     [provider]    Family History Family History  Problem Relation Age of Onset  . Stroke Mother   . Heart disease Father   . Diabetes Daughter   . Heart disease Other   . Stroke Other     Social History Social History   Tobacco Use  . Smoking status: Never Smoker  . Smokeless tobacco: Never Used  Substance Use Topics  . Alcohol use: No  . Drug use: No     Allergies   Patient has no known allergies.   Review of Systems Review of Systems  Constitutional: Negative for fever.  HENT: Negative for sore throat.   Eyes: Negative for visual disturbance.  Respiratory: Negative for shortness of breath.   Cardiovascular: Negative for chest pain.  Gastrointestinal: Positive for abdominal pain.  Genitourinary: Positive for flank pain and hematuria. Negative for dysuria.  Musculoskeletal: Positive for back pain. Negative for neck pain.  Skin:  Negative for rash.  Neurological: Negative for dizziness, syncope, weakness and headaches.     Physical Exam Updated Vital Signs BP (!) 173/64 (BP Location: Right Arm)   Pulse (!) 36   Temp 97.9 F (36.6 C)   Resp 18   SpO2 98%   Physical Exam  Constitutional: He is oriented to person, place, and time. He appears well-developed and well-nourished.  HENT:  Head: Normocephalic and atraumatic.  Eyes: Conjunctivae are normal.  Neck: Neck supple.  Cardiovascular: Regular rhythm and normal pulses. Bradycardia present.  No murmur heard. Pulmonary/Chest: Effort normal and breath sounds normal. No respiratory  distress.  Abdominal: Soft. There is no tenderness.  Musculoskeletal: He exhibits no edema or deformity.  Neurological: He is alert and oriented to person, place, and time. He has normal strength. No sensory deficit. GCS eye subscore is 4. GCS verbal subscore is 5. GCS motor subscore is 6.  Skin: Skin is warm and dry.  Psychiatric: He has a normal mood and affect.  Nursing note and vitals reviewed.    ED Treatments / Results  Labs (all labs ordered are listed, but only abnormal results are displayed) Labs Reviewed  URINALYSIS, ROUTINE W REFLEX MICROSCOPIC - Abnormal; Notable for the following components:      Result Value   Color, Urine STRAW (*)    Glucose, UA >=500 (*)    Hgb urine dipstick MODERATE (*)    All other components within normal limits  I-STAT CHEM 8, ED - Abnormal; Notable for the following components:   BUN 23 (*)    Creatinine, Ser 1.30 (*)    Glucose, Bld 198 (*)    All other components within normal limits    EKG EKG Interpretation  Date/Time:  Tuesday July 24 2017 02:56:34 EDT Ventricular Rate:  77 PR Interval:  136 QRS Duration: 88 QT Interval:  410 QTC Calculation: 463 R Axis:   -7 Text Interpretation:  Sinus rhythm with frequent Premature ventricular complexes in a pattern of bigeminy Possible Left atrial enlargement Left ventricular hypertrophy with repolarization abnormality Abnormal ECG new PVCs from prior ecg 10/07 Confirmed by Aletta Edouard [id number]) on 07/24/2017 6:54:10 AM   Radiology Ct Renal Stone Study  Result Date: 07/24/2017 CLINICAL DATA:  Left back pain with nausea since yesterday morning. History of renal stones. EXAM: CT ABDOMEN AND PELVIS WITHOUT CONTRAST TECHNIQUE: Multidetector CT imaging of the abdomen and pelvis was performed following the standard protocol without IV contrast. COMPARISON:  06/26/2013 FINDINGS: Lower chest: Borderline cardiomegaly without pericardial effusion or thickening. No active pulmonary disease.  Hepatobiliary: No focal liver abnormality is seen. No gallstones, gallbladder wall thickening, or biliary dilatation. Pancreas: Unremarkable. No pancreatic ductal dilatation or surrounding inflammatory changes. Spleen: Normal in size without focal abnormality. Adrenals/Urinary Tract: Normal bilateral adrenal glands. Two nonobstructing left lower pole renal calculi are identified the larger of which measures 7 mm and the smaller more medial calculus measuring 3 mm. A 7 mm left pelvic brim ureteral calculus is identified causing mild to moderate left-sided hydroureteronephrosis. Additional distal left ureteral calculus is noted measuring 4 mm. Parapelvic cysts noted of the right renal collecting system. No hydroureteronephrosis or calculus is identified within the right ureter. Subtle calcification is seen in the lower pole the right kidney measuring up to 6 mm. Simple and complex renal cysts are identified in the upper pole the right kidney the larger of which is more simple in appearance measuring up to 3.9 cm. A homogeneous hyperdense exophytic cyst arising from the upper  pole the right kidney is also noted measuring 0.7 cm in diameter likely proteinaceous or hemorrhagic in etiology. Stomach/Bowel: Appendectomy.  No bowel obstruction or inflammation. Vascular/Lymphatic: Mild aortic atherosclerosis. No enlarged abdominal or pelvic lymph nodes. Reproductive: Prostate is unremarkable. Other: Right herniorrhaphy.  Left fat containing inguinal hernia. Musculoskeletal: Multilevel lumbar spondylosis with marked degenerative disc flattening L4-5 and L5-S1. No acute osseous abnormality. IMPRESSION: 1. Tandem mid and distal left ureteral calculi as above measuring 4 mm in the distal left ureter and 7 mm at the pelvic brim. This is causing mild to moderate left-sided hydroureteronephrosis. 2. Bilateral nonobstructing renal calculi. 3. Simple and complex renal cysts in the upper pole the right kidney, with interval increase in  size of simple cyst since the 2015 exam. The simple cyst previously measured 2.7 cm and currently measures 3.9 cm. The presumed hemorrhagic or proteinaceous cyst measured 0.8 cm and is stable. Parapelvic cysts re-demonstrated of the right kidney. Electronically Signed   By: Ashley Royalty M.D.   On: 07/24/2017 03:46    Procedures Procedures (including critical care time)  Medications Ordered in ED Medications  oxyCODONE-acetaminophen (PERCOCET/ROXICET) 5-325 MG per tablet 1 tablet (1 tablet Oral Given 07/24/17 0249)  ketorolac (TORADOL) injection 30 mg (30 mg Intramuscular Given 07/24/17 0539)     Initial Impression / Assessment and Plan / ED Course  I have reviewed the triage vital signs and the nursing notes.  Pertinent labs & imaging results that were available during my care of the patient were reviewed by me and considered in my medical decision making (see chart for details).  Clinical Course as of Jul 26 1530  Tue Jul 24, 2017  0706 Patient had lab work concerning for a slightly elevated creatinine and some hematuria.  His CAT scan showed 2 stones on the left side 7 mm distal and 4 mm mid ureter.  Showed some associated hydro-.  Patient was evaluated after an the triage area and states he was pain-free after receiving a shot of Toradol.  He would like to be discharged and has appropriate follow-up with urology.  He is asking for a prescription for some Flomax and some Dilaudid pills and will follow up with urology and cardiology is scheduled.  He understands indications for return.  I offered him to stay and get a more thorough work-up he would like to be discharged.   [MB]    Clinical Course User Index [MB] Hayden Rasmussen, MD     Final Clinical Impressions(s) / ED Diagnoses   Final diagnoses:  Ureteral colic  PVC's (premature ventricular contractions)  Bradycardia    ED Discharge Orders        Ordered    tamsulosin (FLOMAX) 0.4 MG CAPS capsule  Daily     07/24/17 0710     HYDROmorphone (DILAUDID) 2 MG tablet  Every 4 hours PRN     07/24/17 0710       Hayden Rasmussen, MD 07/25/17 1533

## 2017-07-24 NOTE — ED Notes (Signed)
Dr Butler at bedside.  

## 2017-07-24 NOTE — Discharge Instructions (Addendum)
Your evaluated in the emergency department for kidney stone pain.  You have 2 kidney stones on the left side.  You are feeling better and wished to be discharged so it would be important for you to follow-up with urology.  Please watch out for any fever or worsening pain that is not controlled by the medication.  You were also noted to have a slow heart rate with a lot of extra beats.  This was noted by your primary care doctor and it is important for you to follow-up with cardiology.  If you have any chest pain lightheadedness or fainting spells you should return to the emergency department

## 2017-07-24 NOTE — ED Triage Notes (Signed)
Pt reports L flank pain onset yesterday, pt states he thinks he has a kidney stone. Pain eased off but came back this AM. Denies N/V/D.

## 2017-07-24 NOTE — ED Provider Notes (Signed)
Patient placed in Quick Look pathway, seen and evaluated   Chief Complaint: Flank pain  HPI:   Patient complains of left flank pain.  He has a history of kidney stones.  States that this feels similar.  Was seen by his urologist earlier this morning, but states that his pain had been relieved, and so the urologist thought that the stone had passed.  He denies any associated fevers, chills, or vomiting.  He rates his pain as 9 out of 10.  It is located on the left flank.   ROS: Flank pain  Physical Exam:   Gen: No distress  Neuro: Awake and Alert  Skin: Warm    Focused Exam: no abdominal tenderness  Patient with flank pain.  Known history of kidney stones.  States that this feels similar.  Seen by urology today for the same.  Will check CT.  Will also get Chem-8 and urinalysis.  Of note, patient is noted to be bradycardic in the low 40s.  He denies any symptoms of dizziness or shortness of breath or syncope.  He states that he has been running in the high 30s and low 40s for the past several months.  His primary care doctor has referred him to a cardiologist.  Additionally, patient is noted to be significantly hypertensive, patient states that he took his blood pressure medicine at 10 PM this evening.  He states that this is abnormally high for him.  We will continue to monitor this as we treat the patient's pain.   Initiation of care has begun. The patient has been counseled on the process, plan, and necessity for staying for the completion/evaluation, and the remainder of the medical screening examination    Montine Circle, Hershal Coria 07/24/17 0255    Ezequiel Essex, MD 07/24/17 [phone]

## 2017-07-26 DIAGNOSIS — N201 Calculus of ureter: Secondary | ICD-10-CM | POA: Diagnosis not present

## 2017-07-31 ENCOUNTER — Other Ambulatory Visit: Payer: Self-pay | Admitting: Urology

## 2017-08-01 ENCOUNTER — Encounter (HOSPITAL_BASED_OUTPATIENT_CLINIC_OR_DEPARTMENT_OTHER): Payer: Self-pay | Admitting: *Deleted

## 2017-08-01 ENCOUNTER — Other Ambulatory Visit: Payer: Self-pay

## 2017-08-01 DIAGNOSIS — I498 Other specified cardiac arrhythmias: Secondary | ICD-10-CM

## 2017-08-01 HISTORY — DX: Other specified cardiac arrhythmias: I49.8

## 2017-08-01 NOTE — Progress Notes (Addendum)
Spoke w/ pt via phone for pre-op interview.  Npo after mn w/ exception clear liquids until 0900 (no cream/mlk products).  Arrive at 1300.  Current istat 8 and ekg , dated 07-24-2017, in chart and epic.  Will take flomax am dos w/ sips of water and if needed take dilaudid.  ADDENDUM:  Reviewed chart w/ dr Dossie Arbour, due to ekg dated 07-24-2017 with bigeminy, stated ok to proceed.

## 2017-08-06 ENCOUNTER — Ambulatory Visit (HOSPITAL_BASED_OUTPATIENT_CLINIC_OR_DEPARTMENT_OTHER): Payer: PPO | Admitting: Anesthesiology

## 2017-08-06 ENCOUNTER — Encounter (HOSPITAL_BASED_OUTPATIENT_CLINIC_OR_DEPARTMENT_OTHER): Payer: Self-pay | Admitting: *Deleted

## 2017-08-06 ENCOUNTER — Other Ambulatory Visit: Payer: Self-pay

## 2017-08-06 ENCOUNTER — Encounter (HOSPITAL_BASED_OUTPATIENT_CLINIC_OR_DEPARTMENT_OTHER)
Admission: RE | Disposition: A | Discharge status: Home / Self Care | Payer: Self-pay | Source: Ambulatory Visit | Attending: Urology

## 2017-08-06 ENCOUNTER — Ambulatory Visit (HOSPITAL_BASED_OUTPATIENT_CLINIC_OR_DEPARTMENT_OTHER)
Admission: RE | Admit: 2017-08-06 | Discharge: 2017-08-06 | Disposition: A | Discharge status: Home / Self Care | Payer: PPO | Source: Ambulatory Visit | Attending: Urology | Admitting: Urology

## 2017-08-06 DIAGNOSIS — Z7984 Long term (current) use of oral hypoglycemic drugs: Secondary | ICD-10-CM | POA: Diagnosis not present

## 2017-08-06 DIAGNOSIS — N201 Calculus of ureter: Secondary | ICD-10-CM | POA: Diagnosis not present

## 2017-08-06 DIAGNOSIS — E119 Type 2 diabetes mellitus without complications: Secondary | ICD-10-CM | POA: Diagnosis not present

## 2017-08-06 DIAGNOSIS — R001 Bradycardia, unspecified: Secondary | ICD-10-CM | POA: Insufficient documentation

## 2017-08-06 DIAGNOSIS — E7800 Pure hypercholesterolemia, unspecified: Secondary | ICD-10-CM | POA: Diagnosis not present

## 2017-08-06 DIAGNOSIS — N132 Hydronephrosis with renal and ureteral calculous obstruction: Secondary | ICD-10-CM | POA: Diagnosis not present

## 2017-08-06 DIAGNOSIS — Z8249 Family history of ischemic heart disease and other diseases of the circulatory system: Secondary | ICD-10-CM | POA: Diagnosis not present

## 2017-08-06 DIAGNOSIS — Z833 Family history of diabetes mellitus: Secondary | ICD-10-CM | POA: Diagnosis not present

## 2017-08-06 DIAGNOSIS — N1330 Unspecified hydronephrosis: Secondary | ICD-10-CM | POA: Diagnosis not present

## 2017-08-06 DIAGNOSIS — Z823 Family history of stroke: Secondary | ICD-10-CM | POA: Diagnosis not present

## 2017-08-06 DIAGNOSIS — Z9079 Acquired absence of other genital organ(s): Secondary | ICD-10-CM | POA: Insufficient documentation

## 2017-08-06 DIAGNOSIS — Z87442 Personal history of urinary calculi: Secondary | ICD-10-CM | POA: Insufficient documentation

## 2017-08-06 DIAGNOSIS — I10 Essential (primary) hypertension: Secondary | ICD-10-CM | POA: Diagnosis not present

## 2017-08-06 HISTORY — DX: Bradycardia, unspecified: R00.1

## 2017-08-06 HISTORY — DX: Calculus of ureter: N20.1

## 2017-08-06 HISTORY — DX: Calculus of kidney: N20.0

## 2017-08-06 HISTORY — DX: Cardiac arrhythmia, unspecified: I49.9

## 2017-08-06 HISTORY — DX: Presence of spectacles and contact lenses: Z97.3

## 2017-08-06 HISTORY — DX: Personal history of other (healed) physical injury and trauma: Z87.828

## 2017-08-06 HISTORY — DX: Type 2 diabetes mellitus without complications: E11.9

## 2017-08-06 HISTORY — DX: Personal history of urinary calculi: Z87.442

## 2017-08-06 HISTORY — PX: CYSTOSCOPY/RETROGRADE/URETEROSCOPY: SHX5316

## 2017-08-06 HISTORY — DX: Presence of external hearing-aid: Z97.4

## 2017-08-06 LAB — POCT I-STAT, CHEM 8
BUN: 29 mg/dL — ABNORMAL HIGH (ref 6–20)
Calcium, Ion: 1.24 mmol/L (ref 1.15–1.40)
Chloride: 107 mmol/L (ref 101–111)
Creatinine, Ser: 1.8 mg/dL — ABNORMAL HIGH (ref 0.61–1.24)
Glucose, Bld: 173 mg/dL — ABNORMAL HIGH (ref 65–99)
HCT: 40 % (ref 39.0–52.0)
Hemoglobin: 13.6 g/dL (ref 13.0–17.0)
Potassium: 4.4 mmol/L (ref 3.5–5.1)
Sodium: 141 mmol/L (ref 135–145)
TCO2: 21 mmol/L — ABNORMAL LOW (ref 22–32)

## 2017-08-06 LAB — GLUCOSE, CAPILLARY: Glucose-Capillary: 173 mg/dL — ABNORMAL HIGH (ref 65–99)

## 2017-08-06 SURGERY — CYSTOSCOPY/RETROGRADE/URETEROSCOPY
Anesthesia: General | Laterality: Left

## 2017-08-06 MED ORDER — DEXAMETHASONE SODIUM PHOSPHATE 10 MG/ML IJ SOLN
INTRAMUSCULAR | Status: AC
  Filled 2017-08-06: qty 1

## 2017-08-06 MED ORDER — MIDAZOLAM HCL 2 MG/2ML IJ SOLN
INTRAMUSCULAR | Status: DC | PRN
  Administered 2017-08-06 (×2): 1 mg via INTRAVENOUS

## 2017-08-06 MED ORDER — PROPOFOL 10 MG/ML IV BOLUS
INTRAVENOUS | Status: DC | PRN
  Administered 2017-08-06: 200 mg via INTRAVENOUS

## 2017-08-06 MED ORDER — CEFAZOLIN SODIUM-DEXTROSE 2-4 GM/100ML-% IV SOLN
2.0000 g | INTRAVENOUS | Status: AC
  Administered 2017-08-06: 2 g via INTRAVENOUS
  Filled 2017-08-06: qty 100

## 2017-08-06 MED ORDER — IOPAMIDOL (ISOVUE-370) INJECTION 76%
INTRAVENOUS | Status: DC | PRN
  Administered 2017-08-06: 10 mL

## 2017-08-06 MED ORDER — EPHEDRINE 5 MG/ML INJ
INTRAVENOUS | Status: AC
  Filled 2017-08-06: qty 10

## 2017-08-06 MED ORDER — LIDOCAINE 2% (20 MG/ML) 5 ML SYRINGE
INTRAMUSCULAR | Status: AC
  Filled 2017-08-06: qty 5

## 2017-08-06 MED ORDER — FENTANYL CITRATE (PF) 100 MCG/2ML IJ SOLN
INTRAMUSCULAR | Status: AC
  Filled 2017-08-06: qty 2

## 2017-08-06 MED ORDER — LACTATED RINGERS IV SOLN
INTRAVENOUS | Status: DC
  Administered 2017-08-06 (×2): via INTRAVENOUS
  Filled 2017-08-06: qty 1000

## 2017-08-06 MED ORDER — PROMETHAZINE HCL 25 MG/ML IJ SOLN
6.2500 mg | INTRAMUSCULAR | Status: DC | PRN
  Filled 2017-08-06: qty 1

## 2017-08-06 MED ORDER — ONDANSETRON HCL 4 MG/2ML IJ SOLN
INTRAMUSCULAR | Status: DC | PRN
  Administered 2017-08-06: 4 mg via INTRAVENOUS

## 2017-08-06 MED ORDER — ONDANSETRON HCL 4 MG/2ML IJ SOLN
INTRAMUSCULAR | Status: AC
  Filled 2017-08-06: qty 2

## 2017-08-06 MED ORDER — OXYCODONE HCL 5 MG PO TABS
5.0000 mg | ORAL_TABLET | Freq: Once | ORAL | Status: DC | PRN
  Filled 2017-08-06: qty 1

## 2017-08-06 MED ORDER — PHENAZOPYRIDINE HCL 100 MG PO TABS: 100.0000 mg | ORAL_TABLET | Freq: Three times a day (TID) | ORAL | 0 refills | Status: DC | PRN

## 2017-08-06 MED ORDER — LIDOCAINE 2% (20 MG/ML) 5 ML SYRINGE
INTRAMUSCULAR | Status: DC | PRN
  Administered 2017-08-06: 100 mg via INTRAVENOUS

## 2017-08-06 MED ORDER — DEXAMETHASONE SODIUM PHOSPHATE 4 MG/ML IJ SOLN
INTRAMUSCULAR | Status: DC | PRN
  Administered 2017-08-06: 10 mg via INTRAVENOUS

## 2017-08-06 MED ORDER — FENTANYL CITRATE (PF) 100 MCG/2ML IJ SOLN
INTRAMUSCULAR | Status: DC | PRN
  Administered 2017-08-06: 50 ug via INTRAVENOUS
  Administered 2017-08-06 (×2): 25 ug via INTRAVENOUS
  Administered 2017-08-06: 50 ug via INTRAVENOUS

## 2017-08-06 MED ORDER — EPHEDRINE SULFATE-NACL 50-0.9 MG/10ML-% IV SOSY
PREFILLED_SYRINGE | INTRAVENOUS | Status: DC | PRN
  Administered 2017-08-06 (×2): 10 mg via INTRAVENOUS

## 2017-08-06 MED ORDER — TAMSULOSIN HCL 0.4 MG PO CAPS: 0.4000 mg | ORAL_CAPSULE | Freq: Every day | ORAL | 0 refills | Status: DC

## 2017-08-06 MED ORDER — CEFAZOLIN SODIUM-DEXTROSE 2-4 GM/100ML-% IV SOLN
INTRAVENOUS | Status: AC
  Filled 2017-08-06: qty 100

## 2017-08-06 MED ORDER — MIDAZOLAM HCL 2 MG/2ML IJ SOLN
INTRAMUSCULAR | Status: AC
  Filled 2017-08-06: qty 2

## 2017-08-06 MED ORDER — SODIUM CHLORIDE 0.9 % IR SOLN
Status: DC | PRN
  Administered 2017-08-06: 3000 mL via INTRAVESICAL

## 2017-08-06 MED ORDER — PHENYLEPHRINE 40 MCG/ML (10ML) SYRINGE FOR IV PUSH (FOR BLOOD PRESSURE SUPPORT)
PREFILLED_SYRINGE | INTRAVENOUS | Status: AC
  Filled 2017-08-06: qty 10

## 2017-08-06 MED ORDER — HYDROMORPHONE HCL 1 MG/ML IJ SOLN
0.2500 mg | INTRAMUSCULAR | Status: DC | PRN
  Filled 2017-08-06: qty 0.5

## 2017-08-06 MED ORDER — PROPOFOL 10 MG/ML IV BOLUS
INTRAVENOUS | Status: AC
  Filled 2017-08-06: qty 40

## 2017-08-06 MED ORDER — OXYCODONE HCL 5 MG/5ML PO SOLN
5.0000 mg | Freq: Once | ORAL | Status: DC | PRN
Start: 2017-08-06 — End: 2017-08-06
  Filled 2017-08-06: qty 5

## 2017-08-06 MED ORDER — PHENYLEPHRINE 40 MCG/ML (10ML) SYRINGE FOR IV PUSH (FOR BLOOD PRESSURE SUPPORT)
PREFILLED_SYRINGE | INTRAVENOUS | Status: DC | PRN
  Administered 2017-08-06: 120 ug via INTRAVENOUS

## 2017-08-06 SURGICAL SUPPLY — 19 items
BAG DRAIN URO-CYSTO SKYTR STRL (DRAIN) ×4 IMPLANT
BASKET STONE 1.7 NGAGE (UROLOGICAL SUPPLIES) ×2 IMPLANT
CATH INTERMIT  6FR 70CM (CATHETERS) IMPLANT
CLOTH BEACON ORANGE TIMEOUT ST (SAFETY) ×2 IMPLANT
FIBER LASER TRAC TIP (UROLOGICAL SUPPLIES) ×2 IMPLANT
GLOVE BIO SURGEON STRL SZ8 (GLOVE) ×2 IMPLANT
GOWN STRL REUS W/TWL XL LVL3 (GOWN DISPOSABLE) ×2 IMPLANT
GUIDEWIRE ANG ZIPWIRE 038X150 (WIRE) ×2 IMPLANT
GUIDEWIRE STR DUAL SENSOR (WIRE) IMPLANT
INFUSOR MANOMETER BAG 3000ML (MISCELLANEOUS) ×2 IMPLANT
IV NS IRRIG 3000ML ARTHROMATIC (IV SOLUTION) ×2 IMPLANT
KIT TURNOVER CYSTO (KITS) ×2 IMPLANT
MANIFOLD NEPTUNE II (INSTRUMENTS) IMPLANT
NS IRRIG 500ML POUR BTL (IV SOLUTION) ×2 IMPLANT
PACK CYSTO (CUSTOM PROCEDURE TRAY) ×2 IMPLANT
STENT URET 6FRX26 CONTOUR (STENTS) ×2 IMPLANT
SYR 10ML LL (SYRINGE) ×2 IMPLANT
TUBE CONNECTING 12X1/4 (SUCTIONS) IMPLANT
TUBING UROLOGY SET (TUBING) ×2 IMPLANT

## 2017-08-06 NOTE — Anesthesia Preprocedure Evaluation (Signed)
Anesthesia Evaluation  Patient identified by MRN, date of birth, ID band Patient awake    Reviewed: Allergy & Precautions, NPO status , Patient's Chart, lab work & pertinent test results  Airway Mallampati: II  TM Distance: >3 FB Neck ROM: Full    Dental no notable dental hx.    Pulmonary neg pulmonary ROS,    Pulmonary exam normal breath sounds clear to auscultation       Cardiovascular hypertension, Pt. on medications negative cardio ROS Normal cardiovascular exam Rhythm:Regular Rate:Normal     Neuro/Psych negative neurological ROS  negative psych ROS   GI/Hepatic negative GI ROS, Neg liver ROS,   Endo/Other  negative endocrine ROSdiabetes, Type 2, Oral Hypoglycemic Agents  Renal/GU negative Renal ROS  negative genitourinary   Musculoskeletal negative musculoskeletal ROS (+)   Abdominal   Peds negative pediatric ROS (+)  Hematology negative hematology ROS (+)   Anesthesia Other Findings   Reproductive/Obstetrics negative OB ROS                             Anesthesia Physical Anesthesia Plan  ASA: II  Anesthesia Plan: General   Post-op Pain Management:    Induction: Intravenous  PONV Risk Score and Plan: 2 and Ondansetron and Midazolam  Airway Management Planned: LMA  Additional Equipment:   Intra-op Plan:   Post-operative Plan: Extubation in OR  Informed Consent: I have reviewed the patients History and Physical, chart, labs and discussed the procedure including the risks, benefits and alternatives for the proposed anesthesia with the patient or authorized representative who has indicated his/her understanding and acceptance.   Dental advisory given  Plan Discussed with: CRNA  Anesthesia Plan Comments:         Anesthesia Quick Evaluation

## 2017-08-06 NOTE — H&P (Signed)
Urology Admission H&P  Chief Complaint: left flank pain  History of Present Illness: Taylor [name] is a 67yo with a hx of left ureteral calculus who has failed medical expulsive therapy. He has persistent left flank pain. He denies any worsening LUTS currently  Past Medical History:  Diagnosis Date  . Bigeminal rhythm 08-01-2017  per pt asymptomatic and pcp has scheduled him for cardiology consult, appointment is made w/ dr harding 08-29-2017   per EKG 07-24-2017 in epic (per confirmation new since last ecg in 2007)  in care everywhere in epic ecg dated 2012 shows sinus brady rate 53 no pvc's  . Bradycardia   . History of contusion    ED visit 08-04-2013 , 9 days past injury ,  dx concussion w/ LOC, per CT inferior frontal lobes contusion hemarrhage-- residual headache (08-01-2017  per pt residual resolved)  . History of kidney stones   . Hypercholesterolemia   . Hypertension   . Nephrolithiasis    bilateral nonobstructive per CT 07-24-2017  . Type 2 diabetes mellitus ([place])    followed by pcp  . Ureteral calculi    left  . Wears glasses   . Wears hearing aid in both ears    Past Surgical History:  Procedure Laterality Date  . LAPAROSCOPIC APPENDECTOMY  11-18-2005    dr wyatt  [facility]  . ORCHIECTOMY  1965   unilateral -- per pt ruptured  . UMBILICAL HERNIA REPAIR  2006  . VARICOSE VEIN SURGERY  2007   left endovascular laser ablation    Home Medications:  Current Facility-Administered Medications  Medication Dose Route Frequency Provider Last Rate Last Dose  . ceFAZolin (ANCEF) IVPB 2g/100 mL premix  2 g Intravenous 30 min Pre-Op Alyson Ingles Candee Furbish, MD      . lactated ringers infusion   Intravenous Continuous Myrtie Soman, MD 50 mL/hr at 08/06/17 1354     Allergies: No Known Allergies  Family History  Problem Relation Age of Onset  . Stroke Mother   . Heart disease Father   . Diabetes Daughter   . Heart disease Other   . Stroke Other    Social History:  reports that he  has never smoked. He has never used smokeless tobacco. He reports that he does not drink alcohol or use drugs.  Review of Systems  Genitourinary: Positive for flank pain.  All other systems reviewed and are negative.   Physical Exam:  Vital signs in last 24 hours: Temp:  [98 F (36.7 C)] 98 F (36.7 C) (06/24 1311) Pulse Rate:  [87] 87 (06/24 1311) Resp:  [18] 18 (06/24 1311) BP: (158)/(65) 158/65 (06/24 1311) SpO2:  [98 %] 98 % (06/24 1311) Weight:  [92.3 kg (203 lb 6.4 oz)] 92.3 kg (203 lb 6.4 oz) (06/24 1311) Physical Exam  Constitutional: He is oriented to person, place, and time. He appears well-developed and well-nourished.  HENT:  Head: Normocephalic and atraumatic.  Eyes: Pupils are equal, round, and reactive to light. EOM are normal.  Neck: Normal range of motion. No thyromegaly present.  Cardiovascular: Normal rate and regular rhythm.  Respiratory: Effort normal. No respiratory distress.  GI: Soft. He exhibits no distension.  Musculoskeletal: Normal range of motion. He exhibits no edema.  Neurological: He is alert and oriented to person, place, and time.  Skin: Skin is warm and dry.  Psychiatric: He has a normal mood and affect. His behavior is normal. Judgment and thought content normal.    Laboratory Data:  Results for orders  placed or performed during the hospital encounter of 08/06/17 (from the past 24 hour(s))  I-STAT, chem 8     Status: Abnormal   Collection Time: 08/06/17  1:56 PM  Result Value Ref Range   Sodium 141 135 - 145 mmol/L   Potassium 4.4 3.5 - 5.1 mmol/L   Chloride 107 101 - 111 mmol/L   BUN 29 (H) 6 - 20 mg/dL   Creatinine, Ser 1.80 (H) 0.61 - 1.24 mg/dL   Glucose, Bld 173 (H) 65 - 99 mg/dL   Calcium, Ion 1.24 1.15 - 1.40 mmol/L   TCO2 21 (L) 22 - 32 mmol/L   Hemoglobin 13.6 13.0 - 17.0 g/dL   HCT 40.0 39.0 - 52.0 %   No results found for this or any previous visit (from the past 240 hour(s)). Creatinine: Recent Labs    08/06/17 1356   CREATININE 1.80*   Baseline Creatinine: 1  Impression/Assessment:  67yo with left ureteral calculus  Plan:  The risks/benefits/alternatives to left ureteroscopic stone extraction was explained to the patient and he understands and wishes to proceed with surgery  Nicolette Bang 08/06/2017, 2:45 PM

## 2017-08-06 NOTE — Op Note (Signed)
.  Preoperative diagnosis: Left ureteral stones  Postoperative diagnosis: Same  Procedure: 1 cystoscopy 2. Left retrograde pyelography 3.  Intraoperative fluoroscopy, under one hour, with interpretation 4.  Left ureteroscopic stone manipulation with laser lithotripsy 5.  Left 6 x 26 JJ stent placement  Attending: Rosie Fate  Anesthesia: General  Estimated blood loss: None  Drains: Left 6 x 26 JJ ureteral stent without tether  Specimens: stone for analysis  Antibiotics: ancef  Findings: 3 left distal ureteral stones. Moderate hydronephrosis. No masses/lesions in the bladder. Ureteral orifices in normal anatomic location.  Indications: Patient is a 67 year old male with a history of left ureteral stones and who has persistent left flank pain.  After discussing treatment options, he decided proceed with left ureteroscopic stone manipulation.  Procedure her in detail: The patient was brought to the operating room and a brief timeout was done to ensure correct patient, correct procedure, correct site.  General anesthesia was administered patient was placed in dorsal lithotomy position.  His genitalia was then prepped and draped in usual sterile fashion.  A rigid 58 French cystoscope was passed in the urethra and the bladder.  Bladder was inspected free masses or lesions.  the ureteral orifices were in the normal orthotopic locations.  a 6 french ureteral catheter was then instilled into the left ureteral orifice.  a gentle retrograde was obtained and findings noted above.  we then placed a zip wire through the ureteral catheter and advanced up to the renal pelvis.  we then removed the cystoscope and cannulated the left ureteral orifice with a semirigid ureteroscope. We encountered multiple stones in the distal ureter. using a 200 nm laser fiber and fragmented the stone into smaller pieces.  the pieces were then removed with a Ngage basket. once all stone fragments were removed we then  placed a 6 x 26 double-j ureteral stent over the original zip wire.  We then removed the wire and good coil was noted in the the renal pelvis under fluoroscopy and the bladder under direct vision.    the stone fragments were then removed from the bladder and sent for analysis.   the bladder was then drained and this concluded the procedure which was well tolerated by patient.  Complications: None  Condition: Stable, extubated, transferred to PACU  Plan: Patient is to be discharged home as to follow-up in one week for stent removal.

## 2017-08-06 NOTE — Anesthesia Procedure Notes (Signed)
Procedure Name: LMA Insertion Date/Time: 08/06/2017 3:12 PM Performed by: Lynda Rainwater, MD Pre-anesthesia Checklist: Patient identified, Emergency Drugs available, Suction available and Patient being monitored Patient Re-evaluated:Patient Re-evaluated prior to induction Oxygen Delivery Method: Circle system utilized Preoxygenation: Pre-oxygenation with 100% oxygen Induction Type: IV induction Ventilation: Mask ventilation without difficulty LMA: LMA inserted LMA Size: 4.0 Number of attempts: 1 Airway Equipment and Method: Bite block Placement Confirmation: positive ETCO2 Tube secured with: Tape Dental Injury: Teeth and Oropharynx as per pre-operative assessment

## 2017-08-06 NOTE — Discharge Instructions (Signed)
Ureteral Stent Implantation, Care After °Refer to this sheet in the next few weeks. These instructions provide you with information about caring for yourself after your procedure. Your health care provider may also give you more specific instructions. Your treatment has been planned according to current medical practices, but problems sometimes occur. Call your health care provider if you have any problems or questions after your procedure. °What can I expect after the procedure? °After the procedure, it is common to have: °· Nausea. °· Mild pain when you urinate. You may feel this pain in your lower back or lower abdomen. Pain should stop within a few minutes after you urinate. This may last for up to 1 week. °· A small amount of blood in your urine for several days. ° °Follow these instructions at home: ° °Medicines °· Take over-the-counter and prescription medicines only as told by your health care provider. °· If you were prescribed an antibiotic medicine, take it as told by your health care provider. Do not stop taking the antibiotic even if you start to feel better. °· Do not drive for 24 hours if you received a sedative. °· Do not drive or operate heavy machinery while taking prescription pain medicines. °Activity °· Return to your normal activities as told by your health care provider. Ask your health care provider what activities are safe for you. °· Do not lift anything that is heavier than 10 lb (4.5 kg). Follow this limit for 1 week after your procedure, or for as long as told by your health care provider. °General instructions °· Watch for any blood in your urine. Call your health care provider if the amount of blood in your urine increases. °· If you have a catheter: °? Follow instructions from your health care provider about taking care of your catheter and collection bag. °? Do not take baths, swim, or use a hot tub until your health care provider approves. °· Drink enough fluid to keep your urine  clear or pale yellow. °· Keep all follow-up visits as told by your health care provider. This is important. °Contact a health care provider if: °· You have pain that gets worse or does not get better with medicine, especially pain when you urinate. °· You have difficulty urinating. °· You feel nauseous or you vomit repeatedly during a period of more than 2 days after the procedure. °Get help right away if: °· Your urine is dark red or has blood clots in it. °· You are leaking urine (have incontinence). °· The end of the stent comes out of your urethra. °· You cannot urinate. °· You have sudden, sharp, or severe pain in your abdomen or lower back. °· You have a fever. °This information is not intended to replace advice given to you by your health care provider. Make sure you discuss any questions you have with your health care provider. °Document Released: 10/02/2012 Document Revised: 07/08/2015 Document Reviewed: 08/14/2014 °Elsevier Interactive Patient Education © 2018 [employer] ° ° °Post Anesthesia Home Care Instructions ° °Activity: °Get plenty of rest for the remainder of the day. A responsible individual must stay with you for 24 hours following the procedure.  °For the next 24 hours, DO NOT: °-Drive a car °-Operate machinery °-Drink alcoholic beverages °-Take any medication unless instructed by your physician °-Make any legal decisions or sign important papers. ° °Meals: °Start with liquid foods such as gelatin or soup. Progress to regular foods as tolerated. Avoid greasy, spicy, heavy foods. If nausea   and/or vomiting occur, drink only clear liquids until the nausea and/or vomiting subsides. Call your physician if vomiting continues. ° °Special Instructions/Symptoms: °Your throat may feel dry or sore from the anesthesia or the breathing tube placed in your throat during surgery. If this causes discomfort, gargle with warm salt water. The discomfort should disappear within 24 hours. ° °If you had a  scopolamine patch placed behind your ear for the management of post- operative nausea and/or vomiting: ° °1. The medication in the patch is effective for 72 hours, after which it should be removed.  Wrap patch in a tissue and discard in the trash. Wash hands thoroughly with soap and water. °2. You may remove the patch earlier than 72 hours if you experience unpleasant side effects which may include dry mouth, dizziness or visual disturbances. °3. Avoid touching the patch. Wash your hands with soap and water after contact with the patch. °  ° °

## 2017-08-06 NOTE — Transfer of Care (Signed)
  Last Vitals:  Vitals Value Taken Time  BP 133/58 08/06/2017  4:00 PM  Temp 36.9 C 08/06/2017  4:00 PM  Pulse 88 08/06/2017  4:02 PM  Resp 11 08/06/2017  4:02 PM  SpO2 98 % 08/06/2017  4:02 PM  Vitals shown include unvalidated device data.  Last Pain:  Vitals:   08/06/17 1323  TempSrc:   PainSc: 4       Patients Stated Pain Goal: 9 (08/06/17 1323)  Immediate Anesthesia Transfer of Care Note  Patient: Taylor [name]  Procedure(s) Performed: Procedure(s) (LRB): CYSTOSCOPY/RETROGRADE/URETEROSCOPY laser and stone basket extrection (Left)  Patient Location: PACU  Anesthesia Type: General  Level of Consciousness: awake, alert  and oriented  Airway & Oxygen Therapy: Patient Spontanous Breathing and Patient connected to nasal cannula oxygen  Post-op Assessment: Report given to PACU RN and Post -op Vital signs reviewed and stable  Post vital signs: Reviewed and stable  Complications: No apparent anesthesia complications

## 2017-08-07 ENCOUNTER — Encounter (HOSPITAL_BASED_OUTPATIENT_CLINIC_OR_DEPARTMENT_OTHER): Payer: Self-pay | Admitting: Urology

## 2017-08-07 NOTE — Anesthesia Postprocedure Evaluation (Signed)
Anesthesia Post Note  Patient: WYNN ALLDREDGE  Procedure(s) Performed: CYSTOSCOPY/RETROGRADE/URETEROSCOPY laser and stone basket extrection (Left )     Patient location during evaluation: PACU Anesthesia Type: General Level of consciousness: awake and alert Pain management: pain level controlled Vital Signs Assessment: post-procedure vital signs reviewed and stable Respiratory status: spontaneous breathing, nonlabored ventilation and respiratory function stable Cardiovascular status: blood pressure returned to baseline and stable Postop Assessment: no apparent nausea or vomiting Anesthetic complications: no    Last Vitals:  Vitals:   08/06/17 1630 08/06/17 1715  BP: (!) 168/70 (!) 159/98  Pulse: 83 80  Resp: 16 16  Temp:  36.6 C  SpO2: 96% 98%    Last Pain:  Vitals:   08/06/17 1700  TempSrc:   PainSc: 0-No pain                 Lynda Rainwater

## 2017-08-13 DIAGNOSIS — N201 Calculus of ureter: Secondary | ICD-10-CM | POA: Diagnosis not present

## 2017-08-13 DIAGNOSIS — I2510 Atherosclerotic heart disease of native coronary artery without angina pectoris: Secondary | ICD-10-CM

## 2017-08-13 HISTORY — DX: Atherosclerotic heart disease of native coronary artery without angina pectoris: I25.10

## 2017-08-15 DIAGNOSIS — R2232 Localized swelling, mass and lump, left upper limb: Secondary | ICD-10-CM | POA: Diagnosis not present

## 2017-08-29 ENCOUNTER — Encounter: Payer: Self-pay | Admitting: Cardiology

## 2017-08-29 ENCOUNTER — Ambulatory Visit: Payer: PPO | Admitting: Cardiology

## 2017-08-29 DIAGNOSIS — I493 Ventricular premature depolarization: Secondary | ICD-10-CM | POA: Diagnosis not present

## 2017-08-29 DIAGNOSIS — E785 Hyperlipidemia, unspecified: Secondary | ICD-10-CM

## 2017-08-29 DIAGNOSIS — I498 Other specified cardiac arrhythmias: Secondary | ICD-10-CM | POA: Insufficient documentation

## 2017-08-29 DIAGNOSIS — I499 Cardiac arrhythmia, unspecified: Secondary | ICD-10-CM | POA: Diagnosis not present

## 2017-08-29 DIAGNOSIS — R079 Chest pain, unspecified: Secondary | ICD-10-CM | POA: Diagnosis not present

## 2017-08-29 DIAGNOSIS — E1169 Type 2 diabetes mellitus with other specified complication: Secondary | ICD-10-CM

## 2017-08-29 DIAGNOSIS — Z8249 Family history of ischemic heart disease and other diseases of the circulatory system: Secondary | ICD-10-CM | POA: Diagnosis not present

## 2017-08-29 DIAGNOSIS — I10 Essential (primary) hypertension: Secondary | ICD-10-CM

## 2017-08-29 HISTORY — DX: Family history of ischemic heart disease and other diseases of the circulatory system: Z82.49

## 2017-08-29 NOTE — Patient Instructions (Addendum)
Medication Instructions:  Your physician recommends that you continue on your current medications as directed. Please refer to the Current Medication list given to you today.   Labwork: none  Testing/Procedures: Your physician has requested that you have an echocardiogram. Echocardiography is a painless test that uses sound waves to create images of your heart. It provides your doctor with information about the size and shape of your heart and how well your heart's chambers and valves are working. This procedure takes approximately one hour. There are no restrictions for this procedure.  Your physician has requested that you have en exercise stress myoview. For further information please visit [email or web address]. Please follow instruction sheet, as given.  Your physician has recommended that you wear a holter monitor. Holter monitors are medical devices that record the heart's electrical activity. Doctors most often use these monitors to diagnose arrhythmias. Arrhythmias are problems with the speed or rhythm of the heartbeat. The monitor is a small, portable device. You can wear one while you do your normal daily activities. This is usually used to diagnose what is causing palpitations/syncope (passing out).    Follow-Up: Your physician recommends that you schedule a follow-up appointment in: 2 weeks with Dr. Ellyn Hack - Scheduled 8/2 at 10:40am with Dr. Ellyn Hack.   Any Other Special Instructions Will Be Listed Below (If Applicable).     If you need a refill on your cardiac medications before your next appointment, please call your pharmacy.

## 2017-08-29 NOTE — Progress Notes (Signed)
PCP: Haywood Pao, MD  Clinic Note: Chief Complaint  Patient presents with  . New Patient (Initial Visit)    Bradycardia, PVCs    HPI: Taylor [name] is a 67 y.o. male who is being seen today for the evaluation of bradycardia with ventricular bigeminy at the request of Tisovec, Fransico Him, MD.  Taylor [name] was just seen last month by his PP - HR noted to be in 47s.  & has had times with HR in mid 54s.   He has a family history concerning for father had an MI and died at age 47.  Had 1 paternal uncle who died of CAD in his 31s and a second paternal uncle who also had CAD.  His mother had A. fib.  Recent Hospitalizations:   ER visit for ureteral colic July 25, 7339.  Presented with acute left-sided flank pain radiating to the lower quadrant.  Passed multiple stones.  EKG noted to have PVCs with bigeminy.  Denied any cardiac symptoms.  Studies Personally Reviewed - (if available, images/films reviewed: From Epic Chart or Care Everywhere)  EKG July 24, 2017: Sinus rhythm with frequent PVCs and bigeminy pattern rate 77 bpm.  Interval History: Taylor [name] presents here today to evaluate frequent PVCs and reportedly low heart rate.  He actually today has an EKG with a rate of 75 bpm but in ventricular bigeminy.  His pulse rate actually feels like it is in the high 30s.  He is usually very active works out of the gym doing the treadmill etc. for at least 2 miles and then does some upper body weights as well as some other calisthenics.  He does not have any feeling of chronotropic incompetence where he loses energy or lack of get up and go.  He tells me about 30 years ago he had an episode of near syncope where he fell backwards and was found to be bradycardic at that time.  As far as the PVCs, he says maybe feel his heart skipping every now and then but nothing is ever really bothered him.  The only cardiovascular issues at other than this is that he has had left leg venous ablation for  venous stasis/varicose veins.  We almost got to the end of the visit and then we talked about the last week or so he has been having an unusual symptom where he walked up the hill to a store just this past weekend and he felt a heavy burning sensation in his chest that nagged him for a few minutes.  He got to the store and stood around a little bit and then was able to walk around okay after that and was able to carry on.  But he never had that issue before and then since he has had at least 2 or 3 episodes not quite as dramatic but of this similar kind of chest discomfort with exertion.  Prior to this past week or so he is never had that.  He thought it may be related to his kidney stones.  He apparently is just lost 30 pounds dealing with kidney stones over the last several months.  He really otherwise has not noted exertional dyspnea except for these 3-4 episodes over the last couple weeks.  Otherwise he is been doing fine with his workouts.  He denies any PND, orthopnea and since he had a venous ablation and he did not really having any edema. No syncope or near syncope type symptoms.  No  TIA or amaurosis fugax.    No melena, hematochezia, epistaxis or  bruising.  He has not had any recent kidney stones since June and therefore has not had any further hematuria.   ROS: A comprehensive was performed. Review of Systems  Constitutional: Positive for weight loss (Poor p.o. intake for about 2 3 months dealing with nephrolithiasis.  Lost about 30 pounds.). Negative for chills, fever and malaise/fatigue.  HENT: Negative for congestion.   Respiratory: Negative for cough, shortness of breath and wheezing.   Gastrointestinal: Negative for abdominal pain, constipation and diarrhea.       Poor p.o. intake for about 2 3 months dealing with nephrolithiasis.  Lost about 30 pounds.  Genitourinary: Positive for flank pain (NotAny more flank pain since his lithotripsy kidney stone treatment).       He had flank  pain and lightheadedness dizziness, nausea with decreased appetite etc. as result of his recurrent nephrolithiasis.  Since he is finally had most of them removed, he is doing much better now.  Musculoskeletal: Negative for falls and joint pain.  Neurological: Positive for dizziness (A couple times and he is dealing with his kidney stones).  Psychiatric/Behavioral: Negative.   All other systems reviewed and are negative.   I have reviewed and (if needed) personally updated the patient's problem list, medications, allergies, past medical and surgical history, social and family history.   Past Medical History:  Diagnosis Date  . Bigeminal rhythm 08/01/2017   per EKG 07-24-2017 in epic (per confirmation new since last ecg in 2007)  in care everywhere in epic ecg dated 2012 shows sinus brady rate 53 no pvc's --per pt asymptomatic and   . Bradycardia   . Family history of premature coronary artery disease 08/29/2017   Father - MI 39. Paternal uncle x 2 -1 with MI & other with CABG in 40s  . History of contusion    ED visit 08-04-2013 , 9 days past injury ,  dx concussion w/ LOC, per CT inferior frontal lobes contusion hemarrhage-- residual headache (08-01-2017  per pt residual resolved)  . History of kidney stones   . Hypercholesterolemia   . Hypertension   . Nephrolithiasis    bilateral nonobstructive per CT 07-24-2017  . Type 2 diabetes mellitus ([place])    followed by pcp  . Ureteral calculi    left  . Wears glasses   . Wears hearing aid in both ears     Past Surgical History:  Procedure Laterality Date  . CYSTOSCOPY/RETROGRADE/URETEROSCOPY Left 08/06/2017   Procedure: CYSTOSCOPY/RETROGRADE/URETEROSCOPY laser and stone basket extrection;  Surgeon: Cleon Gustin, MD;  [facility];  Service: Urology;  Laterality: Left;  . LAPAROSCOPIC APPENDECTOMY  11-18-2005    dr wyatt  [facility]  . ORCHIECTOMY  1965   unilateral -- per pt ruptured  . UMBILICAL HERNIA REPAIR   2006  . VARICOSE VEIN SURGERY  2007   left endovascular laser ablation    No outpatient medications have been marked as taking for the 08/29/17 encounter (Office Visit) with Leonie Man, MD.    No Known Allergies  Social History   Tobacco Use  . Smoking status: Never Smoker  . Smokeless tobacco: Never Used  Substance Use Topics  . Alcohol use: No  . Drug use: No   Social History   Social History Narrative  . Not on file    family history includes Atrial fibrillation in his mother; CAD in his paternal uncle; Clotting disorder in his  father; Diabetes in his daughter; Heart attack in his paternal uncle; Heart attack (age of onset: 64) in his father; Heart disease in his other; Stroke in his mother and other.  Wt Readings from Last 3 Encounters:  08/31/17 209 lb (94.8 kg)  08/29/17 209 lb (94.8 kg)  08/06/17 203 lb 6.4 oz (92.3 kg)    PHYSICAL EXAM BP 140/70 (BP Location: Left Arm, Patient Position: Sitting, Cuff Size: Normal)   Pulse 75   Ht 5\' 11"  (1.803 m)   Wt 209 lb (94.8 kg)   BMI 29.15 kg/m  Physical Exam  Constitutional: He is oriented to person, place, and time. He appears well-developed and well-nourished. No distress.  Healthy-appearing.  Well-groomed.  HENT:  Head: Normocephalic and atraumatic.  Eyes: Pupils are equal, round, and reactive to light. Conjunctivae and EOM are normal.  Neck: Normal range of motion. Neck supple. No hepatojugular reflux and no JVD present. Carotid bruit is not present. No thyromegaly present.  Cardiovascular: S1 normal, S2 normal, intact distal pulses and normal pulses. A regularly irregular rhythm present. Frequent extrasystoles (He can actually palpate the PVC pulses, but heart rate would otherwise be judged as being high to mid 30s as opposed to what the EKG shows.) are present. Bradycardia present. Exam reveals no gallop and no friction rub.  No murmur heard. Pulmonary/Chest: Effort normal and breath sounds normal. No  respiratory distress. He has no wheezes. He has no rales.  Abdominal: Soft. Bowel sounds are normal. He exhibits no distension. There is no tenderness. There is no rebound.  Musculoskeletal: Normal range of motion. He exhibits no edema.  Lymphadenopathy:    He has no cervical adenopathy.  Neurological: He is alert and oriented to person, place, and time. No cranial nerve deficit.  Skin: Skin is warm and dry. No erythema.  No venous stasis changes  Psychiatric: He has a normal mood and affect. His behavior is normal. Judgment and thought content normal.  Vitals reviewed.    Adult ECG Report  Rate: 75 ;  Rhythm: sinus bradycardia, premature ventricular contractions (PVC) and Bigeminy pattern.  Left atrial enlargement.  LVH criteria.  Otherwise no ischemic ST and T wave changes.  Normal axis, intervals and durations.;   Narrative Interpretation: Actually this EKG is very similar to the EKG had in June with essentially bigeminy pattern.  Several EKGs have shown frequent PVCs.   Other studies Reviewed: Additional studies/ records that were reviewed today include:  Recent Labs:   usually followed by PCP Lab Results  Component Value Date   CREATININE 1.80 (H) 08/06/2017   BUN 29 (H) 08/06/2017   NA 141 08/06/2017   K 4.4 08/06/2017   CL 107 08/06/2017   CO2 25 08/04/2013    Jun 13, 2017: TC 127, TG 64, HDL 32, LDL 82.  A1c 7.4.  BUN/Cr 29/1.8.   ASSESSMENT / PLAN: Problem List Items Addressed This Visit    Ventricular bigeminy (Chronic)    He has lots of PVCs.  He was in ventricular bigeminy the entire time I saw him.  I am concerned about the amount of PVCs that he is having being somewhat pathologic.  This is also concerning and the fact that he has now had symptoms that are, concerning for angina.  Plan: For 48-hour Holter monitor to determine PVC burden, 2D echocardiogram to exclude structural normality, exercise Myoview stress test (treadmill portion to see PVC response to  exercise, imaging to evaluate for Ischemic CAD)  Relevant Orders   EKG 12-Lead (Completed)   ECHOCARDIOGRAM COMPLETE   HOLTER MONITOR - 24 HOUR   MYOCARDIAL PERFUSION IMAGING   Hyperlipidemia associated with type 2 diabetes mellitus (HCC) (Chronic)    He is on atorvastatin 20 mg daily.  Currently with an LDL of 82 he would be pretty much at goal for diabetes, but I am a bit concerned with his family history and now symptoms of exertional chest tightness that we need to be more aggressive.  We will await the results of stress test evaluation.      Relevant Orders   EKG 12-Lead (Completed)   ECHOCARDIOGRAM COMPLETE   HOLTER MONITOR - 24 HOUR   MYOCARDIAL PERFUSION IMAGING   Frequent unifocal PVCs (Chronic)    Frequent PVCs in a patient who has otherwise been pretty healthy but now having some concern of chest pain issues and a significant family history of CAD.  Plan per ventricular bigeminy.      Relevant Orders   EKG 12-Lead (Completed)   ECHOCARDIOGRAM COMPLETE   HOLTER MONITOR - 75 HOUR   MYOCARDIAL PERFUSION IMAGING   Family history of premature coronary artery disease (Chronic)    Pretty significant family history in addition to baseline risk factors of hypertension, hyperlipidemia, diabetes mellitus.  Heretofore he is done well with no notable symptoms, but now has had at least 3-4 episodes of chest pain. Plan: Exercise Myoview stress test.      Relevant Orders   EKG 12-Lead (Completed)   ECHOCARDIOGRAM COMPLETE   HOLTER MONITOR - 24 HOUR   MYOCARDIAL PERFUSION IMAGING   Essential hypertension (Chronic)    Borderline blood pressures a day.  Would like to have him on a beta-blocker but need to see his monitor in order to know the extent of PVCs first.  Continue ACE inhibitor for now.      Relevant Orders   EKG 12-Lead (Completed)   ECHOCARDIOGRAM COMPLETE   HOLTER MONITOR - 24 HOUR   MYOCARDIAL PERFUSION IMAGING   Chest pain with moderate risk for cardiac  etiology    He is usually very active with all his exercise, but the last 3 or 4 episodes of exertional chest discomfort is somewhat concerning in a diabetic with hypertension hyperlipidemia and significant family history.  At least moderate if not high risk for cardiac etiology.    Plan: Exercise Myoview Stress Test --> would like to see his exercise tolerance, symptoms and PVC response to exercise.  Would also like to see imaging as I am concerned that the amount of PVCs would lead to "abnormal treadmill read ".      Relevant Orders   EKG 12-Lead (Completed)   ECHOCARDIOGRAM COMPLETE   HOLTER MONITOR - 24 HOUR   MYOCARDIAL PERFUSION IMAGING      Current medicines are reviewed at length with the patient today.  (+/- concerns) none The following changes have been made:  See below  Patient Instructions  Medication Instructions:  Your physician recommends that you continue on your current medications as directed. Please refer to the Current Medication list given to you today.   Labwork: none  Testing/Procedures: Your physician has requested that you have an echocardiogram. Echocardiography is a painless test that uses sound waves to create images of your heart. It provides your doctor with information about the size and shape of your heart and how well your heart's chambers and valves are working. This procedure takes approximately one hour. There are no restrictions for this procedure.  Your physician has requested that you have en exercise stress myoview. For further information please visit [email or web address]. Please follow instruction sheet, as given.  Your physician has recommended that you wear a holter monitor. Holter monitors are medical devices that record the heart's electrical activity. Doctors most often use these monitors to diagnose arrhythmias. Arrhythmias are problems with the speed or rhythm of the heartbeat. The monitor is a small, portable device. You can wear one  while you do your normal daily activities. This is usually used to diagnose what is causing palpitations/syncope (passing out).    Follow-Up: Your physician recommends that you schedule a follow-up appointment in: 2 weeks with Dr. Ellyn Hack - Scheduled 8/2 at 10:40am with Dr. Ellyn Hack.   Any Other Special Instructions Will Be Listed Below (If Applicable).     If you need a refill on your cardiac medications before your next appointment, please call your pharmacy.    Studies Ordered:   Orders Placed This Encounter  Procedures  . HOLTER MONITOR - 24 HOUR  . MYOCARDIAL PERFUSION IMAGING  . EKG 12-Lead  . ECHOCARDIOGRAM COMPLETE      [name], M.D., M.S. Interventional Cardiologist   Pager # [phone] Phone # [phone] [address]. [place], [place] [address]   Thank you for choosing [facility] at [facility]!!

## 2017-08-30 ENCOUNTER — Telehealth (HOSPITAL_COMMUNITY): Payer: Self-pay

## 2017-08-30 DIAGNOSIS — Z4789 Encounter for other orthopedic aftercare: Secondary | ICD-10-CM | POA: Insufficient documentation

## 2017-08-30 NOTE — Telephone Encounter (Signed)
Encounter complete. 

## 2017-08-31 ENCOUNTER — Encounter: Payer: Self-pay | Admitting: Cardiology

## 2017-08-31 ENCOUNTER — Ambulatory Visit (HOSPITAL_BASED_OUTPATIENT_CLINIC_OR_DEPARTMENT_OTHER)
Admission: RE | Admit: 2017-08-31 | Discharge: 2017-08-31 | Disposition: A | Discharge status: Home / Self Care | Payer: PPO | Source: Ambulatory Visit | Attending: Cardiology | Admitting: Cardiology

## 2017-08-31 ENCOUNTER — Ambulatory Visit: Payer: Self-pay | Admitting: Cardiology

## 2017-08-31 ENCOUNTER — Ambulatory Visit (INDEPENDENT_AMBULATORY_CARE_PROVIDER_SITE_OTHER): Payer: PPO | Admitting: Cardiology

## 2017-08-31 VITALS — BP 158/62 | HR 46 | Ht 71.0 in | Wt 209.2 lb

## 2017-08-31 DIAGNOSIS — E8770 Fluid overload, unspecified: Secondary | ICD-10-CM | POA: Diagnosis not present

## 2017-08-31 DIAGNOSIS — I428 Other cardiomyopathies: Secondary | ICD-10-CM | POA: Diagnosis present

## 2017-08-31 DIAGNOSIS — R7989 Other specified abnormal findings of blood chemistry: Secondary | ICD-10-CM | POA: Diagnosis present

## 2017-08-31 DIAGNOSIS — E1169 Type 2 diabetes mellitus with other specified complication: Secondary | ICD-10-CM | POA: Diagnosis not present

## 2017-08-31 DIAGNOSIS — Z7982 Long term (current) use of aspirin: Secondary | ICD-10-CM | POA: Diagnosis not present

## 2017-08-31 DIAGNOSIS — I25110 Atherosclerotic heart disease of native coronary artery with unstable angina pectoris: Secondary | ICD-10-CM | POA: Diagnosis present

## 2017-08-31 DIAGNOSIS — I499 Cardiac arrhythmia, unspecified: Secondary | ICD-10-CM

## 2017-08-31 DIAGNOSIS — I10 Essential (primary) hypertension: Secondary | ICD-10-CM | POA: Diagnosis not present

## 2017-08-31 DIAGNOSIS — I493 Ventricular premature depolarization: Secondary | ICD-10-CM

## 2017-08-31 DIAGNOSIS — E1122 Type 2 diabetes mellitus with diabetic chronic kidney disease: Secondary | ICD-10-CM | POA: Diagnosis present

## 2017-08-31 DIAGNOSIS — E785 Hyperlipidemia, unspecified: Secondary | ICD-10-CM | POA: Diagnosis present

## 2017-08-31 DIAGNOSIS — I498 Other specified cardiac arrhythmias: Secondary | ICD-10-CM

## 2017-08-31 DIAGNOSIS — Z832 Family history of diseases of the blood and blood-forming organs and certain disorders involving the immune mechanism: Secondary | ICD-10-CM | POA: Diagnosis not present

## 2017-08-31 DIAGNOSIS — I472 Ventricular tachycardia: Secondary | ICD-10-CM | POA: Diagnosis present

## 2017-08-31 DIAGNOSIS — Z833 Family history of diabetes mellitus: Secondary | ICD-10-CM | POA: Diagnosis not present

## 2017-08-31 DIAGNOSIS — E119 Type 2 diabetes mellitus without complications: Secondary | ICD-10-CM | POA: Diagnosis not present

## 2017-08-31 DIAGNOSIS — Z974 Presence of external hearing-aid: Secondary | ICD-10-CM | POA: Diagnosis not present

## 2017-08-31 DIAGNOSIS — I371 Nonrheumatic pulmonary valve insufficiency: Secondary | ICD-10-CM | POA: Diagnosis not present

## 2017-08-31 DIAGNOSIS — Z973 Presence of spectacles and contact lenses: Secondary | ICD-10-CM | POA: Diagnosis not present

## 2017-08-31 DIAGNOSIS — Z79899 Other long term (current) drug therapy: Secondary | ICD-10-CM | POA: Diagnosis not present

## 2017-08-31 DIAGNOSIS — Z87442 Personal history of urinary calculi: Secondary | ICD-10-CM | POA: Diagnosis not present

## 2017-08-31 DIAGNOSIS — Z01810 Encounter for preprocedural cardiovascular examination: Secondary | ICD-10-CM | POA: Diagnosis not present

## 2017-08-31 DIAGNOSIS — I2510 Atherosclerotic heart disease of native coronary artery without angina pectoris: Secondary | ICD-10-CM | POA: Diagnosis not present

## 2017-08-31 DIAGNOSIS — J9811 Atelectasis: Secondary | ICD-10-CM | POA: Diagnosis not present

## 2017-08-31 DIAGNOSIS — Z9079 Acquired absence of other genital organ(s): Secondary | ICD-10-CM | POA: Diagnosis not present

## 2017-08-31 DIAGNOSIS — I129 Hypertensive chronic kidney disease with stage 1 through stage 4 chronic kidney disease, or unspecified chronic kidney disease: Secondary | ICD-10-CM | POA: Diagnosis present

## 2017-08-31 DIAGNOSIS — Z8249 Family history of ischemic heart disease and other diseases of the circulatory system: Secondary | ICD-10-CM | POA: Diagnosis not present

## 2017-08-31 DIAGNOSIS — R9439 Abnormal result of other cardiovascular function study: Secondary | ICD-10-CM | POA: Diagnosis present

## 2017-08-31 DIAGNOSIS — Z7984 Long term (current) use of oral hypoglycemic drugs: Secondary | ICD-10-CM | POA: Diagnosis not present

## 2017-08-31 DIAGNOSIS — R079 Chest pain, unspecified: Secondary | ICD-10-CM

## 2017-08-31 DIAGNOSIS — Z01812 Encounter for preprocedural laboratory examination: Secondary | ICD-10-CM | POA: Diagnosis not present

## 2017-08-31 DIAGNOSIS — R918 Other nonspecific abnormal finding of lung field: Secondary | ICD-10-CM | POA: Diagnosis not present

## 2017-08-31 DIAGNOSIS — I361 Nonrheumatic tricuspid (valve) insufficiency: Secondary | ICD-10-CM | POA: Diagnosis not present

## 2017-08-31 DIAGNOSIS — N182 Chronic kidney disease, stage 2 (mild): Secondary | ICD-10-CM | POA: Diagnosis present

## 2017-08-31 DIAGNOSIS — J984 Other disorders of lung: Secondary | ICD-10-CM | POA: Diagnosis not present

## 2017-08-31 DIAGNOSIS — Z823 Family history of stroke: Secondary | ICD-10-CM | POA: Diagnosis not present

## 2017-08-31 DIAGNOSIS — Z951 Presence of aortocoronary bypass graft: Secondary | ICD-10-CM | POA: Diagnosis not present

## 2017-08-31 DIAGNOSIS — I25119 Atherosclerotic heart disease of native coronary artery with unspecified angina pectoris: Secondary | ICD-10-CM | POA: Diagnosis not present

## 2017-08-31 DIAGNOSIS — E7800 Pure hypercholesterolemia, unspecified: Secondary | ICD-10-CM | POA: Diagnosis present

## 2017-08-31 DIAGNOSIS — N201 Calculus of ureter: Secondary | ICD-10-CM | POA: Diagnosis not present

## 2017-08-31 HISTORY — PX: NM MYOVIEW LTD: HXRAD82

## 2017-08-31 LAB — MYOCARDIAL PERFUSION IMAGING
Estimated workload: 7 METS
Exercise duration (min): 6 min
Exercise duration (sec): 1 s
MPHR: 153 {beats}/min
Peak HR: 142 {beats}/min
Percent HR: 92 %
RPE: 18
Rest HR: 53 {beats}/min
SDS: 7
SRS: 3
SSS: 10
TID: 1.33

## 2017-08-31 MED ORDER — METOPROLOL TARTRATE 25 MG PO TABS: 12.5000 mg | ORAL_TABLET | Freq: Two times a day (BID) | ORAL | 2 refills | Status: DC

## 2017-08-31 MED ORDER — TECHNETIUM TC 99M TETROFOSMIN IV KIT
10.8000 | PACK | Freq: Once | INTRAVENOUS | Status: AC | PRN
  Administered 2017-08-31: 10.8 via INTRAVENOUS
  Filled 2017-08-31: qty 11

## 2017-08-31 MED ORDER — TECHNETIUM TC 99M TETROFOSMIN IV KIT
31.2000 | PACK | Freq: Once | INTRAVENOUS | Status: AC | PRN
  Administered 2017-08-31: 31.2 via INTRAVENOUS
  Filled 2017-08-31: qty 32

## 2017-08-31 NOTE — Assessment & Plan Note (Signed)
He is on atorvastatin 20 mg daily.  Currently with an LDL of 82 he would be pretty much at goal for diabetes, but I am a bit concerned with his family history and now symptoms of exertional chest tightness that we need to be more aggressive.  We will await the results of stress test evaluation.

## 2017-08-31 NOTE — Progress Notes (Signed)
Cardiology Office Note:    Date:  08/31/2017   ID:  Taylor [name], DOB May 19, 1950, MRN [id number]  PCP:  Haywood Pao, MD  Cardiologist: Dr. Ellyn Hack  CC: abnormal stress test  History of Present Illness:    Taylor [name] is a 67 y.o. male who was recently seen on 7/17 by Dr. Ellyn Hack for ventricular bigeminy. He has a very significant FH of CAD (father died of MI at age 67, uncle died of CAD in his 44s, another uncle with premature CAD). He noted a concern of chest burning when walking up a hill, and Dr. Ellyn Hack ordered a treadmill nuclear test to be performed today to evaluate this.  Nuclear test today (noted below) high risk, both on ECG and imaging. He was brought back for an urgent visit today to review these findings. We discussed the results of his stress test and the recommendation for cardiac cath, and he is amenable to proceeding.  Denied any active symptoms. Understandably very concerned given his family history.  Past Medical History:  Diagnosis Date  . Bigeminal rhythm 08/01/2017   per EKG 07-24-2017 in epic (per confirmation new since last ecg in 2007)  in care everywhere in epic ecg dated 2012 shows sinus brady rate 53 no pvc's --per pt asymptomatic and   . Bradycardia   . Family history of premature coronary artery disease 08/29/2017   Father - MI 39. Paternal uncle x 2 -1 with MI & other with CABG in 31s  . History of contusion    ED visit 08-04-2013 , 9 days past injury ,  dx concussion w/ LOC, per CT inferior frontal lobes contusion hemarrhage-- residual headache (08-01-2017  per pt residual resolved)  . History of kidney stones   . Hypercholesterolemia   . Hypertension   . Nephrolithiasis    bilateral nonobstructive per CT 07-24-2017  . Type 2 diabetes mellitus ([place])    followed by pcp  . Ureteral calculi    left  . Wears glasses   . Wears hearing aid in both ears     Past Surgical History:  Procedure Laterality Date  .  CYSTOSCOPY/RETROGRADE/URETEROSCOPY Left 08/06/2017   Procedure: CYSTOSCOPY/RETROGRADE/URETEROSCOPY laser and stone basket extrection;  Surgeon: Cleon Gustin, MD;  Location: [facility];  Service: Urology;  Laterality: Left;  . LAPAROSCOPIC APPENDECTOMY  11-18-2005    dr wyatt  [facility]  . ORCHIECTOMY  1965   unilateral -- per pt ruptured  . UMBILICAL HERNIA REPAIR  2006  . VARICOSE VEIN SURGERY  2007   left endovascular laser ablation    Current Medications: Current Outpatient Medications on File Prior to Visit  Medication Sig  . aspirin 81 MG tablet Take 81 mg by mouth every evening.   Marland Kitchen atorvastatin (LIPITOR) 20 MG tablet Take 20 mg by mouth every evening.   Marland Kitchen lisinopril (PRINIVIL,ZESTRIL) 40 MG tablet Take 40 mg by mouth every evening.   . sitaGLIPtan-metformin (JANUMET) 50-500 MG per tablet Take 1 tablet by mouth at bedtime.    No current facility-administered medications on file prior to visit.      Allergies:   Patient has no known allergies.   Social History   Socioeconomic History  . Marital status: Married    Spouse name: Not on file  . Number of children: Not on file  . Years of education: Not on file  . Highest education level: Not on file  Occupational History  . Not on file  Social Needs  .  Financial resource strain: Not on file  . Food insecurity:    Worry: Not on file    Inability: Not on file  . Transportation needs:    Medical: Not on file    Non-medical: Not on file  Tobacco Use  . Smoking status: Never Smoker  . Smokeless tobacco: Never Used  Substance and Sexual Activity  . Alcohol use: No  . Drug use: No  . Sexual activity: Yes  Lifestyle  . Physical activity:    Days per week: Not on file    Minutes per session: Not on file  . Stress: Not on file  Relationships  . Social connections:    Talks on phone: Not on file    Gets together: Not on file    Attends religious service: Not on file    Active member of club or  organization: Not on file    Attends meetings of clubs or organizations: Not on file    Relationship status: Not on file  Other Topics Concern  . Not on file  Social History Narrative  . Not on file     Family History: The patient's family history includes Atrial fibrillation in his mother; CAD in his paternal uncle; Clotting disorder in his father; Diabetes in his daughter; Heart attack in his paternal uncle; Heart attack (age of onset: 33) in his father; Heart disease in his other; Stroke in his mother and other.  ROS:   Please see the history of present illness.  Additional pertinent ROS: Review of Systems  Constitutional: Positive for weight loss. Negative for chills and fever.       About 30 lbs with kidney stones  HENT: Negative for ear pain and hearing loss.   Eyes: Negative for double vision and pain.  Respiratory: Negative for shortness of breath.   Cardiovascular: Negative for chest pain, palpitations, orthopnea, claudication, leg swelling and PND.  Gastrointestinal: Negative for abdominal pain, blood in stool and melena.  Genitourinary: Negative for hematuria.       Resolving kidney stones  Musculoskeletal: Negative for falls.  Skin: Negative for rash.  Neurological: Negative for loss of consciousness.  Endo/Heme/Allergies: Does not bruise/bleed easily.   EKGs/Labs/Other Studies Reviewed:    The following studies were reviewed today: Stress test today  Blood pressure demonstrated a normal response to exercise.  Horizontal ST segment depression ST segment depression of 3 mm was noted during stress in the II, III, V5 and V6 leads, beginning at 2 minutes of stress, and returning to baseline after 5-9 minutes of recovery.  Defect 1: There is a medium defect of moderate severity present in the mid anterior, apical anterior, apical lateral and apex location.  This is a high risk study.  Findings consistent with ischemia.  The study was not gated therefore LVEF and  wall motion can't be evaluated.   This is a high risk study with medium size, moderate severity reversible defect in the mid and apical anterior, apical lateral walls and in the true apex consistent with ischemia in the LAD territory, however given significant arrhythmias, severe ST depressions and TID (transient ischemic dilatation) a multivessel disease is highly likely (SDS =7). The study was not gated therefore LVEF and wall motion can't be evaluated. Stress ECG showed significant arrhythmias including frequent PVCs in a pattern of bigeminy and one run of nsVT consisting of three beats. The results were sent to the referring doctor on 08/31/2017 at 11 am and the patient will be seen and evaluated  by a cardiologist this afternoon.   EKG:  EKG is not ordered today, as it was done with stress test (above)  Recent Labs: 08/06/2017: BUN 29; Creatinine, Ser 1.80; Hemoglobin 13.6; Potassium 4.4; Sodium 141  Recent Lipid Panel No results found for: CHOL, TRIG, HDL, CHOLHDL, VLDL, LDLCALC, LDLDIRECT  Physical Exam:    VS:  BP (!) 158/62   Pulse (!) 46   Ht 5\' 11"  (1.803 m)   Wt 209 lb 3.2 oz (94.9 kg)   SpO2 97%   BMI 29.18 kg/m     Wt Readings from Last 3 Encounters:  08/31/17 209 lb 3.2 oz (94.9 kg)  08/31/17 209 lb (94.8 kg)  08/29/17 209 lb (94.8 kg)     GEN: Well nourished, well developed in no acute distress HEENT: Normal NECK: No JVD; No carotid bruits LYMPHATICS: No lymphadenopathy CARDIAC: regular ventricular bigeminy, no murmurs, rubs, gallops. Radial and DP pulses 2+ bilaterally. RESPIRATORY:  Clear to auscultation without rales, wheezing or rhonchi  ABDOMEN: Soft, non-tender, non-distended MUSCULOSKELETAL:  No edema; No deformity  SKIN: Warm and dry NEUROLOGIC:  Alert and oriented x 3 PSYCHIATRIC:  Normal affect   ASSESSMENT:    1. Abnormal nuclear stress test   2. Pre-operative laboratory examination   3. Ventricular bigeminy    PLAN:    1. Abnormal treadmill  nuclear stress test: high risk study Will pursue cardiac cath. Risks and benefits of cardiac catheterization have been discussed with the patient.  These include bleeding, infection, kidney damage, stroke, heart attack, death.  The patient understands these risks and is willing to proceed.  Scheduled cath for Monday, 7/22 with Dr. Ellyn Hack. Instructions reviewed. Avoid strenuous activity given stress test findings. No planned surgeries or procedures, no contraindication to DAPT.  2. Ventricular bigeminy and frequent PVCs: stress test strips reviewed. Patient asymptomatic but had high burden of PVCs with occasional several beat runs of NSVT. Patient was asymptomatic. While his palpable pulse is slow in the 30s, this is due to his bigeminy. He augments appropriately with exercise and does not otherwise appear to have conduction disease. Will try to suppress PVCs with metoprolol BID, especially given concern that these may be ischemia driven.  Plan for follow up: cath on Monday 7/22. Already had echo, monitor, and follow up with Dr. Ellyn Hack scheduled.  Medication Adjustments/Labs and Tests Ordered: Current medicines are reviewed at length with the patient today.  Concerns regarding medicines are outlined above.  Orders Placed This Encounter  Procedures  . Basic metabolic panel  . CBC   Meds ordered this encounter  Medications  . metoprolol tartrate (LOPRESSOR) 25 MG tablet    Sig: Take 0.5 tablets (12.5 mg total) by mouth 2 (two) times daily.    Dispense:  60 tablet    Refill:  2    Patient Instructions  Medication Instructions: Start: Metoprolol 12.5 mg two times a day   If you need a refill on your cardiac medications before your next appointment, please call your pharmacy.   Labwork: Your physician recommends that you return for lab work in: Today CBC, BMP   Procedures/Testing: Your physician has requested that you have a cardiac catheterization. Cardiac catheterization is used to  diagnose and/or treat various heart conditions. Doctors may recommend this procedure for a number of different reasons. The most common reason is to evaluate chest pain. Chest pain can be a symptom of coronary artery disease (CAD), and cardiac catheterization can show whether plaque is narrowing or blocking your heart's  arteries. This procedure is also used to evaluate the valves, as well as measure the blood flow and oxygen levels in different parts of your heart. For further information please visit HugeFiesta.[email or web address]. Please follow instruction sheet, as given. [facility]  Follow-Up: Your physician wants you to keep your follow up appointment with Dr. Ellyn Hack   Special Instructions:      [facility] GROUP [facility] CARDIOVASCULAR DIVISION [facility] [address] [place] [address] Dept: [phone] Loc: [place]  08/31/2017  You are scheduled for a Cardiac Catheterization on Monday, July 22 with Dr. [name].  1. Please arrive at the [facility] (Main Entrance A) at [facility]: [address] [place], [place] [address] at 8:00 AM (This time is two hours before your procedure to ensure your preparation). Free valet parking service is available.   Special note: Every effort is made to have your procedure done on time. Please understand that emergencies sometimes delay scheduled procedures.  2. Diet: Do not eat solid foods after midnight.  The patient may have clear liquids until 5am upon the day of the procedure.  3. Labs:Today at [facility]  4. Medication instructions in preparation for your procedure:  Stop taking Janumet the morning of procedure and 48 hours after  On the morning of your procedure, take your Aspirin and any morning medicines NOT listed above.  You may use sips of water.  5. Plan for one night stay--bring personal belongings. 6. Bring a current list of your medications  and current insurance cards. 7. You MUST have a responsible person to drive you home. 8. Someone MUST be with you the first 24 hours after you arrive home or your discharge will be delayed. 9. Please wear clothes that are easy to get on and off and wear slip-on shoes.  Thank you for allowing [place] to care for you!   -- [place] [facility]    Thank you for choosing Heartcare at [facility]!!       Signed, Buford Dresser, MD PhD 08/31/2017 5:05 PM    [place]

## 2017-08-31 NOTE — Assessment & Plan Note (Signed)
He has lots of PVCs.  He was in ventricular bigeminy the entire time I saw him.  I am concerned about the amount of PVCs that he is having being somewhat pathologic.  This is also concerning and the fact that he has now had symptoms that are, concerning for angina.  Plan: For 48-hour Holter monitor to determine PVC burden, 2D echocardiogram to exclude structural normality, exercise Myoview stress test (treadmill portion to see PVC response to exercise, imaging to evaluate for Ischemic CAD)

## 2017-08-31 NOTE — Assessment & Plan Note (Signed)
Frequent PVCs in a patient who has otherwise been pretty healthy but now having some concern of chest pain issues and a significant family history of CAD.  Plan per ventricular bigeminy.

## 2017-08-31 NOTE — Assessment & Plan Note (Signed)
He is usually very active with all his exercise, but the last 3 or 4 episodes of exertional chest discomfort is somewhat concerning in a diabetic with hypertension hyperlipidemia and significant family history.  At least moderate if not high risk for cardiac etiology.    Plan: Exercise Myoview Stress Test --> would like to see his exercise tolerance, symptoms and PVC response to exercise.  Would also like to see imaging as I am concerned that the amount of PVCs would lead to "abnormal treadmill read ".

## 2017-08-31 NOTE — Patient Instructions (Addendum)
Medication Instructions: Start: Metoprolol 12.5 mg two times a day   If you need a refill on your cardiac medications before your next appointment, please call your pharmacy.   Labwork: Your physician recommends that you return for lab work in: Today CBC, BMP   Procedures/Testing: Your physician has requested that you have a cardiac catheterization. Cardiac catheterization is used to diagnose and/or treat various heart conditions. Doctors may recommend this procedure for a number of different reasons. The most common reason is to evaluate chest pain. Chest pain can be a symptom of coronary artery disease (CAD), and cardiac catheterization can show whether plaque is narrowing or blocking your heart's arteries. This procedure is also used to evaluate the valves, as well as measure the blood flow and oxygen levels in different parts of your heart. For further information please visit HugeFiesta.[email or web address]. Please follow instruction sheet, as given. [facility]  Follow-Up: Your physician wants you to keep your follow up appointment with Dr. Ellyn Hack   Special Instructions:      [facility] GROUP [facility] CARDIOVASCULAR DIVISION [facility] [address] [place] [address] Dept: [phone] Loc: [place]  08/31/2017  You are scheduled for a Cardiac Catheterization on Monday, July 22 with Dr. [name].  1. Please arrive at the [facility] (Main Entrance A) at [facility]: [address] [place], [place] [address] at 8:00 AM (This time is two hours before your procedure to ensure your preparation). Free valet parking service is available.   Special note: Every effort is made to have your procedure done on time. Please understand that emergencies sometimes delay scheduled procedures.  2. Diet: Do not eat solid foods after midnight.  The patient may have clear liquids until 5am upon the day of the  procedure.  3. Labs:Today at [facility]  4. Medication instructions in preparation for your procedure:  Stop taking Janumet the morning of procedure and 48 hours after  On the morning of your procedure, take your Aspirin and any morning medicines NOT listed above.  You may use sips of water.  5. Plan for one night stay--bring personal belongings. 6. Bring a current list of your medications and current insurance cards. 7. You MUST have a responsible person to drive you home. 8. Someone MUST be with you the first 24 hours after you arrive home or your discharge will be delayed. 9. Please wear clothes that are easy to get on and off and wear slip-on shoes.  Thank you for allowing [place] to care for you!   -- [place] [facility]    Thank you for choosing Heartcare at [facility]!!

## 2017-08-31 NOTE — H&P (View-Only) (Signed)
Cardiology Office Note:    Date:  08/31/2017   ID:  Taylor [name], DOB 1950/11/23, MRN [id number]  PCP:  Taylor Pao, MD  Cardiologist: Dr. Ellyn [name]  CC: abnormal stress test  History of Present Illness:    Taylor [name] is a 67 y.o. male who was recently seen on 7/17 by Dr. Ellyn [name] for ventricular bigeminy. He has a very significant FH of CAD (father died of MI at age 60, uncle died of CAD in his 95s, another uncle with premature CAD). He noted a concern of chest burning when walking up a hill, and Dr. Ellyn [name] ordered a treadmill nuclear test to be performed today to evaluate this.  Nuclear test today (noted below) high risk, both on ECG and imaging. He was brought back for an urgent visit today to review these findings. We discussed the results of his stress test and the recommendation for cardiac cath, and he is amenable to proceeding.  Denied any active symptoms. Understandably very concerned given his family history.  Past Medical History:  Diagnosis Date  . Bigeminal rhythm 08/01/2017   per EKG 07-24-2017 in epic (per confirmation new since last ecg in 2007)  in care everywhere in epic ecg dated 2012 shows sinus brady rate 53 no pvc's --per pt asymptomatic and   . Bradycardia   . Family history of premature coronary artery disease 08/29/2017   Father - MI 70. Paternal uncle x 2 -1 with MI & other with CABG in 49s  . History of contusion    ED visit 08-04-2013 , 9 days past injury ,  dx concussion w/ LOC, per CT inferior frontal lobes contusion hemarrhage-- residual headache (08-01-2017  per pt residual resolved)  . History of kidney stones   . Hypercholesterolemia   . Hypertension   . Nephrolithiasis    bilateral nonobstructive per CT 07-24-2017  . Type 2 diabetes mellitus ([place])    followed by pcp  . Ureteral calculi    left  . Wears glasses   . Wears hearing aid in both ears     Past Surgical History:  Procedure Laterality Date  .  CYSTOSCOPY/RETROGRADE/URETEROSCOPY Left 08/06/2017   Procedure: CYSTOSCOPY/RETROGRADE/URETEROSCOPY laser and stone basket extrection;  Surgeon: Taylor Gustin, MD;  [facility];  Service: Urology;  Laterality: Left;  . LAPAROSCOPIC APPENDECTOMY  11-18-2005    dr wyatt  [facility]  . ORCHIECTOMY  1965   unilateral -- per pt ruptured  . UMBILICAL HERNIA REPAIR  2006  . VARICOSE VEIN SURGERY  2007   left endovascular laser ablation    Current Medications: Current Outpatient Medications on File Prior to Visit  Medication Sig  . aspirin 81 MG tablet Take 81 mg by mouth every evening.   Marland Kitchen atorvastatin (LIPITOR) 20 MG tablet Take 20 mg by mouth every evening.   Marland Kitchen lisinopril (PRINIVIL,ZESTRIL) 40 MG tablet Take 40 mg by mouth every evening.   . sitaGLIPtan-metformin (JANUMET) 50-500 MG per tablet Take 1 tablet by mouth at bedtime.    No current facility-administered medications on file prior to visit.      Allergies:   Patient has no known allergies.   Social History   Socioeconomic History  . Marital status: Married    Spouse name: Not on file  . Number of children: Not on file  . Years of education: Not on file  . Highest education level: Not on file  Occupational History  . Not on file  Social Needs  .  Financial resource strain: Not on file  . Food insecurity:    Worry: Not on file    Inability: Not on file  . Transportation needs:    Medical: Not on file    Non-medical: Not on file  Tobacco Use  . Smoking status: Never Smoker  . Smokeless tobacco: Never Used  Substance and Sexual Activity  . Alcohol use: No  . Drug use: No  . Sexual activity: Yes  Lifestyle  . Physical activity:    Days per week: Not on file    Minutes per session: Not on file  . Stress: Not on file  Relationships  . Social connections:    Talks on phone: Not on file    Gets together: Not on file    Attends religious service: Not on file    Active member of club or  organization: Not on file    Attends meetings of clubs or organizations: Not on file    Relationship status: Not on file  Other Topics Concern  . Not on file  Social History Narrative  . Not on file     Family History: The patient's family history includes Atrial fibrillation in his mother; CAD in his paternal uncle; Clotting disorder in his father; Diabetes in his daughter; Heart attack in his paternal uncle; Heart attack (age of onset: 75) in his father; Heart disease in his other; Stroke in his mother and other.  ROS:   Please see the history of present illness.  Additional pertinent ROS: Review of Systems  Constitutional: Positive for weight loss. Negative for chills and fever.       About 30 lbs with kidney stones  HENT: Negative for ear pain and hearing loss.   Eyes: Negative for double vision and pain.  Respiratory: Negative for shortness of breath.   Cardiovascular: Negative for chest pain, palpitations, orthopnea, claudication, leg swelling and PND.  Gastrointestinal: Negative for abdominal pain, blood in stool and melena.  Genitourinary: Negative for hematuria.       Resolving kidney stones  Musculoskeletal: Negative for falls.  Skin: Negative for rash.  Neurological: Negative for loss of consciousness.  Endo/Heme/Allergies: Does not bruise/bleed easily.   EKGs/Labs/Other Studies Reviewed:    The following studies were reviewed today: Stress test today  Blood pressure demonstrated a normal response to exercise.  Horizontal ST segment depression ST segment depression of 3 mm was noted during stress in the II, III, V5 and V6 leads, beginning at 2 minutes of stress, and returning to baseline after 5-9 minutes of recovery.  Defect 1: There is a medium defect of moderate severity present in the mid anterior, apical anterior, apical lateral and apex location.  This is a high risk study.  Findings consistent with ischemia.  The study was not gated therefore LVEF and  wall motion can't be evaluated.   This is a high risk study with medium size, moderate severity reversible defect in the mid and apical anterior, apical lateral walls and in the true apex consistent with ischemia in the LAD territory, however given significant arrhythmias, severe ST depressions and TID (transient ischemic dilatation) a multivessel disease is highly likely (SDS =7). The study was not gated therefore LVEF and wall motion can't be evaluated. Stress ECG showed significant arrhythmias including frequent PVCs in a pattern of bigeminy and one run of nsVT consisting of three beats. The results were sent to the referring doctor on 08/31/2017 at 11 am and the patient will be seen and evaluated  by a cardiologist this afternoon.   EKG:  EKG is not ordered today, as it was done with stress test (above)  Recent Labs: 08/06/2017: BUN 29; Creatinine, Ser 1.80; Hemoglobin 13.6; Potassium 4.4; Sodium 141  Recent Lipid Panel No results found for: CHOL, TRIG, HDL, CHOLHDL, VLDL, LDLCALC, LDLDIRECT  Physical Exam:    VS:  BP (!) 158/62   Pulse (!) 46   Ht 5\' 11"  (1.803 m)   Wt 209 lb 3.2 oz (94.9 kg)   SpO2 97%   BMI 29.18 kg/m     Wt Readings from Last 3 Encounters:  08/31/17 209 lb 3.2 oz (94.9 kg)  08/31/17 209 lb (94.8 kg)  08/29/17 209 lb (94.8 kg)     GEN: Well nourished, well developed in no acute distress HEENT: Normal NECK: No JVD; No carotid bruits LYMPHATICS: No lymphadenopathy CARDIAC: regular ventricular bigeminy, no murmurs, rubs, gallops. Radial and DP pulses 2+ bilaterally. RESPIRATORY:  Clear to auscultation without rales, wheezing or rhonchi  ABDOMEN: Soft, non-tender, non-distended MUSCULOSKELETAL:  No edema; No deformity  SKIN: Warm and dry NEUROLOGIC:  Alert and oriented x 3 PSYCHIATRIC:  Normal affect   ASSESSMENT:    1. Abnormal nuclear stress test   2. Pre-operative laboratory examination   3. Ventricular bigeminy    PLAN:    1. Abnormal treadmill  nuclear stress test: high risk study Will pursue cardiac cath. Risks and benefits of cardiac catheterization have been discussed with the patient.  These include bleeding, infection, kidney damage, stroke, heart attack, death.  The patient understands these risks and is willing to proceed.  Scheduled cath for Monday, 7/22 with Dr. Ellyn [name]. Instructions reviewed. Avoid strenuous activity given stress test findings. No planned surgeries or procedures, no contraindication to DAPT.  2. Ventricular bigeminy and frequent PVCs: stress test strips reviewed. Patient asymptomatic but had high burden of PVCs with occasional several beat runs of NSVT. Patient was asymptomatic. While his palpable pulse is slow in the 30s, this is due to his bigeminy. He augments appropriately with exercise and does not otherwise appear to have conduction disease. Will try to suppress PVCs with metoprolol BID, especially given concern that these may be ischemia driven.  Plan for follow up: cath on Monday 7/22. Already had echo, monitor, and follow up with Dr. Ellyn [name] scheduled.  Medication Adjustments/Labs and Tests Ordered: Current medicines are reviewed at length with the patient today.  Concerns regarding medicines are outlined above.  Orders Placed This Encounter  Procedures  . Basic metabolic panel  . CBC   Meds ordered this encounter  Medications  . metoprolol tartrate (LOPRESSOR) 25 MG tablet    Sig: Take 0.5 tablets (12.5 mg total) by mouth 2 (two) times daily.    Dispense:  60 tablet    Refill:  2    Patient Instructions  Medication Instructions: Start: Metoprolol 12.5 mg two times a day   If you need a refill on your cardiac medications before your next appointment, please call your pharmacy.   Labwork: Your physician recommends that you return for lab work in: Today CBC, BMP   Procedures/Testing: Your physician has requested that you have a cardiac catheterization. Cardiac catheterization is used to  diagnose and/or treat various heart conditions. Doctors may recommend this procedure for a number of different reasons. The most common reason is to evaluate chest pain. Chest pain can be a symptom of coronary artery disease (CAD), and cardiac catheterization can show whether plaque is narrowing or blocking your heart's  arteries. This procedure is also used to evaluate the valves, as well as measure the blood flow and oxygen levels in different parts of your heart. For further information please visit [email or web address]. Please follow instruction sheet, as given. [facility]  Follow-Up: Your physician wants you to keep your follow up appointment with Dr. Ellyn [name]   Special Instructions:      [facility] CARDIOVASCULAR DIVISION [facility] [address] [place] [address] Dept: [phone] Loc: [place]  08/31/2017  You are scheduled for a Cardiac Catheterization on Monday, July 22 with Dr. [name].  1. Please arrive at the [facility] (Main Entrance A) at [facility]: [address] [place], [place] [address] at 8:00 AM (This time is two hours before your procedure to ensure your preparation). Free valet parking service is available.   Special note: Every effort is made to have your procedure done on time. Please understand that emergencies sometimes delay scheduled procedures.  2. Diet: Do not eat solid foods after midnight.  The patient may have clear liquids until 5am upon the day of the procedure.  3. Labs:Today at [facility]  4. Medication instructions in preparation for your procedure:  Stop taking Janumet the morning of procedure and 48 hours after  On the morning of your procedure, take your Aspirin and any morning medicines NOT listed above.  You may use sips of water.  5. Plan for one night stay--bring personal belongings. 6. Bring a current list of your medications  and current insurance cards. 7. You MUST have a responsible person to drive you home. 8. Someone MUST be with you the first 24 hours after you arrive home or your discharge will be delayed. 9. Please wear clothes that are easy to get on and off and wear slip-on shoes.  Thank you for allowing [place] to care for you!   -- [facility]    Thank you for choosing [facility] at [facility]!!       Signed, Buford Dresser, MD PhD 08/31/2017 5:05 PM    [place]

## 2017-08-31 NOTE — Assessment & Plan Note (Signed)
Borderline blood pressures a day.  Would like to have him on a beta-blocker but need to see his monitor in order to know the extent of PVCs first.  Continue ACE inhibitor for now.

## 2017-08-31 NOTE — Assessment & Plan Note (Addendum)
Pretty significant family history in addition to baseline risk factors of hypertension, hyperlipidemia, diabetes mellitus.  Heretofore he is done well with no notable symptoms, but now has had at least 3-4 episodes of chest pain. Plan: Exercise Myoview stress test.

## 2017-09-01 LAB — BASIC METABOLIC PANEL
BUN/Creatinine Ratio: 11 (ref 10–24)
BUN: 21 mg/dL (ref 8–27)
CO2: 23 mmol/L (ref 20–29)
Calcium: 9.4 mg/dL (ref 8.6–10.2)
Chloride: 104 mmol/L (ref 96–106)
Creatinine, Ser: 1.91 mg/dL — ABNORMAL HIGH (ref 0.76–1.27)
GFR calc Af Amer: 41 mL/min/{1.73_m2} — ABNORMAL LOW (ref >59)
GFR calc non Af Amer: 35 mL/min/{1.73_m2} — ABNORMAL LOW (ref >59)
Glucose: 110 mg/dL — ABNORMAL HIGH (ref 65–99)
Potassium: 5 mmol/L (ref 3.5–5.2)
Sodium: 142 mmol/L (ref 134–144)

## 2017-09-01 LAB — CBC
Hematocrit: 37.5 % (ref 37.5–51.0)
Hemoglobin: 12.5 g/dL — ABNORMAL LOW (ref 13.0–17.7)
MCH: 28.5 pg (ref 26.6–33.0)
MCHC: 33.3 g/dL (ref 31.5–35.7)
MCV: 86 fL (ref 79–97)
Platelets: 227 10*3/uL (ref 150–450)
RBC: 4.38 x10E6/uL (ref 4.14–5.80)
RDW: 13.2 % (ref 12.3–15.4)
WBC: 5.9 10*3/uL (ref 3.4–10.8)

## 2017-09-03 ENCOUNTER — Other Ambulatory Visit: Payer: Self-pay

## 2017-09-03 ENCOUNTER — Inpatient Hospital Stay (HOSPITAL_COMMUNITY)
Admission: RE | Admit: 2017-09-03 | Discharge: 2017-09-11 | DRG: 234 | Disposition: A | Discharge status: Home / Self Care | Payer: PPO | Source: Ambulatory Visit | Attending: Thoracic Surgery (Cardiothoracic Vascular Surgery) | Admitting: Thoracic Surgery (Cardiothoracic Vascular Surgery)

## 2017-09-03 ENCOUNTER — Encounter (HOSPITAL_COMMUNITY): Payer: Self-pay | Admitting: *Deleted

## 2017-09-03 ENCOUNTER — Inpatient Hospital Stay (HOSPITAL_COMMUNITY)
Admission: RE | Disposition: A | Discharge status: Home / Self Care | Payer: Self-pay | Source: Ambulatory Visit | Attending: Thoracic Surgery (Cardiothoracic Vascular Surgery)

## 2017-09-03 DIAGNOSIS — E785 Hyperlipidemia, unspecified: Secondary | ICD-10-CM | POA: Diagnosis present

## 2017-09-03 DIAGNOSIS — Z9079 Acquired absence of other genital organ(s): Secondary | ICD-10-CM | POA: Diagnosis not present

## 2017-09-03 DIAGNOSIS — E1122 Type 2 diabetes mellitus with diabetic chronic kidney disease: Secondary | ICD-10-CM | POA: Diagnosis present

## 2017-09-03 DIAGNOSIS — E8770 Fluid overload, unspecified: Secondary | ICD-10-CM | POA: Diagnosis not present

## 2017-09-03 DIAGNOSIS — Z823 Family history of stroke: Secondary | ICD-10-CM | POA: Diagnosis not present

## 2017-09-03 DIAGNOSIS — I361 Nonrheumatic tricuspid (valve) insufficiency: Secondary | ICD-10-CM | POA: Diagnosis not present

## 2017-09-03 DIAGNOSIS — Z951 Presence of aortocoronary bypass graft: Secondary | ICD-10-CM | POA: Diagnosis not present

## 2017-09-03 DIAGNOSIS — I472 Ventricular tachycardia: Secondary | ICD-10-CM | POA: Diagnosis present

## 2017-09-03 DIAGNOSIS — R7989 Other specified abnormal findings of blood chemistry: Secondary | ICD-10-CM | POA: Diagnosis present

## 2017-09-03 DIAGNOSIS — J9811 Atelectasis: Secondary | ICD-10-CM

## 2017-09-03 DIAGNOSIS — Z832 Family history of diseases of the blood and blood-forming organs and certain disorders involving the immune mechanism: Secondary | ICD-10-CM

## 2017-09-03 DIAGNOSIS — Z974 Presence of external hearing-aid: Secondary | ICD-10-CM | POA: Diagnosis not present

## 2017-09-03 DIAGNOSIS — I25110 Atherosclerotic heart disease of native coronary artery with unstable angina pectoris: Secondary | ICD-10-CM | POA: Diagnosis present

## 2017-09-03 DIAGNOSIS — Z79899 Other long term (current) drug therapy: Secondary | ICD-10-CM | POA: Diagnosis not present

## 2017-09-03 DIAGNOSIS — I25119 Atherosclerotic heart disease of native coronary artery with unspecified angina pectoris: Secondary | ICD-10-CM

## 2017-09-03 DIAGNOSIS — R9439 Abnormal result of other cardiovascular function study: Secondary | ICD-10-CM | POA: Diagnosis not present

## 2017-09-03 DIAGNOSIS — I2510 Atherosclerotic heart disease of native coronary artery without angina pectoris: Secondary | ICD-10-CM | POA: Diagnosis present

## 2017-09-03 DIAGNOSIS — Z7982 Long term (current) use of aspirin: Secondary | ICD-10-CM | POA: Diagnosis not present

## 2017-09-03 DIAGNOSIS — E7800 Pure hypercholesterolemia, unspecified: Secondary | ICD-10-CM | POA: Diagnosis present

## 2017-09-03 DIAGNOSIS — N182 Chronic kidney disease, stage 2 (mild): Secondary | ICD-10-CM | POA: Diagnosis present

## 2017-09-03 DIAGNOSIS — Z8249 Family history of ischemic heart disease and other diseases of the circulatory system: Secondary | ICD-10-CM

## 2017-09-03 DIAGNOSIS — Z973 Presence of spectacles and contact lenses: Secondary | ICD-10-CM

## 2017-09-03 DIAGNOSIS — Z7984 Long term (current) use of oral hypoglycemic drugs: Secondary | ICD-10-CM | POA: Diagnosis not present

## 2017-09-03 DIAGNOSIS — I493 Ventricular premature depolarization: Secondary | ICD-10-CM | POA: Diagnosis present

## 2017-09-03 DIAGNOSIS — Z87442 Personal history of urinary calculi: Secondary | ICD-10-CM | POA: Diagnosis not present

## 2017-09-03 DIAGNOSIS — I428 Other cardiomyopathies: Secondary | ICD-10-CM | POA: Diagnosis present

## 2017-09-03 DIAGNOSIS — Z833 Family history of diabetes mellitus: Secondary | ICD-10-CM | POA: Diagnosis not present

## 2017-09-03 DIAGNOSIS — I129 Hypertensive chronic kidney disease with stage 1 through stage 4 chronic kidney disease, or unspecified chronic kidney disease: Secondary | ICD-10-CM | POA: Diagnosis present

## 2017-09-03 DIAGNOSIS — E119 Type 2 diabetes mellitus without complications: Secondary | ICD-10-CM | POA: Diagnosis not present

## 2017-09-03 DIAGNOSIS — R079 Chest pain, unspecified: Secondary | ICD-10-CM

## 2017-09-03 DIAGNOSIS — Z01810 Encounter for preprocedural cardiovascular examination: Secondary | ICD-10-CM | POA: Diagnosis not present

## 2017-09-03 DIAGNOSIS — I10 Essential (primary) hypertension: Secondary | ICD-10-CM | POA: Diagnosis not present

## 2017-09-03 HISTORY — PX: LEFT HEART CATH AND CORONARY ANGIOGRAPHY: CATH118249

## 2017-09-03 HISTORY — DX: Presence of aortocoronary bypass graft: Z95.1

## 2017-09-03 LAB — BASIC METABOLIC PANEL
Anion gap: 8 (ref 5–15)
BUN: 24 mg/dL — ABNORMAL HIGH (ref 8–23)
CO2: 23 mmol/L (ref 22–32)
Calcium: 8.9 mg/dL (ref 8.9–10.3)
Chloride: 110 mmol/L (ref 98–111)
Creatinine, Ser: 1.71 mg/dL — ABNORMAL HIGH (ref 0.61–1.24)
GFR calc Af Amer: 46 mL/min — ABNORMAL LOW (ref >60)
GFR calc non Af Amer: 40 mL/min — ABNORMAL LOW (ref >60)
Glucose, Bld: 117 mg/dL — ABNORMAL HIGH (ref 70–99)
Potassium: 4.8 mmol/L (ref 3.5–5.1)
Sodium: 141 mmol/L (ref 135–145)

## 2017-09-03 LAB — GLUCOSE, CAPILLARY
Glucose-Capillary: 124 mg/dL — ABNORMAL HIGH (ref 70–99)
Glucose-Capillary: 111 mg/dL — ABNORMAL HIGH (ref 70–99)
Glucose-Capillary: 189 mg/dL — ABNORMAL HIGH (ref 70–99)
Glucose-Capillary: 154 mg/dL — ABNORMAL HIGH (ref 70–99)

## 2017-09-03 LAB — HEMOGLOBIN A1C
Hgb A1c MFr Bld: 7.7 % — ABNORMAL HIGH (ref 4.8–5.6)
Mean Plasma Glucose: 174.29 mg/dL

## 2017-09-03 SURGERY — LEFT HEART CATH AND CORONARY ANGIOGRAPHY
Anesthesia: LOCAL

## 2017-09-03 MED ORDER — ASPIRIN 81 MG PO CHEW: 81.0000 mg | CHEWABLE_TABLET | ORAL | Status: DC

## 2017-09-03 MED ORDER — LIDOCAINE HCL (PF) 1 % IJ SOLN
INTRAMUSCULAR | Status: DC | PRN
  Administered 2017-09-03: 2 mL via SUBCUTANEOUS

## 2017-09-03 MED ORDER — AMLODIPINE BESYLATE 5 MG PO TABS
5.0000 mg | ORAL_TABLET | Freq: Every day | ORAL | Status: DC
  Administered 2017-09-03 – 2017-09-05 (×3): 5 mg via ORAL
  Filled 2017-09-03 (×3): qty 1

## 2017-09-03 MED ORDER — LABETALOL HCL 5 MG/ML IV SOLN
INTRAVENOUS | Status: DC | PRN
  Administered 2017-09-03: 10 mg via INTRAVENOUS

## 2017-09-03 MED ORDER — ATORVASTATIN CALCIUM 80 MG PO TABS
80.0000 mg | ORAL_TABLET | Freq: Every day | ORAL | Status: DC
  Administered 2017-09-03 – 2017-09-10 (×7): 80 mg via ORAL
  Filled 2017-09-03 (×7): qty 1

## 2017-09-03 MED ORDER — INSULIN ASPART 100 UNIT/ML ~~LOC~~ SOLN: 0.0000 [IU] | Freq: Every day | SUBCUTANEOUS | Status: DC

## 2017-09-03 MED ORDER — SODIUM CHLORIDE 0.9% FLUSH: 3.0000 mL | INTRAVENOUS | Status: DC | PRN

## 2017-09-03 MED ORDER — MIDAZOLAM HCL 2 MG/2ML IJ SOLN
INTRAMUSCULAR | Status: DC | PRN
  Administered 2017-09-03: 2 mg via INTRAVENOUS

## 2017-09-03 MED ORDER — IOHEXOL 350 MG/ML SOLN
INTRAVENOUS | Status: DC | PRN
  Administered 2017-09-03: 66 mL via INTRA_ARTERIAL

## 2017-09-03 MED ORDER — SODIUM CHLORIDE 0.9 % IV SOLN
INTRAVENOUS | Status: AC
  Administered 2017-09-03: 15:00:00 via INTRAVENOUS

## 2017-09-03 MED ORDER — LABETALOL HCL 5 MG/ML IV SOLN
INTRAVENOUS | Status: AC
  Filled 2017-09-03: qty 4

## 2017-09-03 MED ORDER — SODIUM CHLORIDE 0.9 % WEIGHT BASED INFUSION: 1.0000 mL/kg/h | INTRAVENOUS | Status: DC

## 2017-09-03 MED ORDER — HEPARIN SODIUM (PORCINE) 1000 UNIT/ML IJ SOLN
INTRAMUSCULAR | Status: AC
  Filled 2017-09-03: qty 1

## 2017-09-03 MED ORDER — METOPROLOL TARTRATE 25 MG PO TABS
25.0000 mg | ORAL_TABLET | Freq: Two times a day (BID) | ORAL | Status: DC
  Administered 2017-09-03 – 2017-09-05 (×5): 25 mg via ORAL
  Filled 2017-09-03 (×6): qty 1

## 2017-09-03 MED ORDER — LABETALOL HCL 5 MG/ML IV SOLN
10.0000 mg | INTRAVENOUS | Status: DC | PRN
  Administered 2017-09-03: 10 mg via INTRAVENOUS

## 2017-09-03 MED ORDER — VERAPAMIL HCL 2.5 MG/ML IV SOLN
INTRAVENOUS | Status: DC | PRN
  Administered 2017-09-03: 10 mL via INTRA_ARTERIAL

## 2017-09-03 MED ORDER — HEPARIN (PORCINE) IN NACL 1000-0.9 UT/500ML-% IV SOLN
INTRAVENOUS | Status: AC
  Filled 2017-09-03: qty 1000

## 2017-09-03 MED ORDER — LIDOCAINE HCL (PF) 1 % IJ SOLN
INTRAMUSCULAR | Status: AC
  Filled 2017-09-03: qty 30

## 2017-09-03 MED ORDER — SODIUM CHLORIDE 0.9% FLUSH: 3.0000 mL | Freq: Two times a day (BID) | INTRAVENOUS | Status: DC

## 2017-09-03 MED ORDER — FENTANYL CITRATE (PF) 100 MCG/2ML IJ SOLN
INTRAMUSCULAR | Status: DC | PRN
  Administered 2017-09-03: 50 ug via INTRAVENOUS

## 2017-09-03 MED ORDER — HEPARIN (PORCINE) IN NACL 1000-0.9 UT/500ML-% IV SOLN
INTRAVENOUS | Status: DC | PRN
  Administered 2017-09-03: 500 mL

## 2017-09-03 MED ORDER — MIDAZOLAM HCL 2 MG/2ML IJ SOLN
INTRAMUSCULAR | Status: AC
  Filled 2017-09-03: qty 2

## 2017-09-03 MED ORDER — SODIUM CHLORIDE 0.9 % IV SOLN
250.0000 mL | INTRAVENOUS | Status: DC | PRN
  Administered 2017-09-06: 12:00:00 via INTRAVENOUS

## 2017-09-03 MED ORDER — VERAPAMIL HCL 2.5 MG/ML IV SOLN
INTRAVENOUS | Status: AC
  Filled 2017-09-03: qty 2

## 2017-09-03 MED ORDER — ONDANSETRON HCL 4 MG/2ML IJ SOLN: 4.0000 mg | Freq: Four times a day (QID) | INTRAMUSCULAR | Status: DC | PRN

## 2017-09-03 MED ORDER — HEPARIN (PORCINE) IN NACL 100-0.45 UNIT/ML-% IJ SOLN
1150.0000 [IU]/h | INTRAMUSCULAR | Status: AC
  Administered 2017-09-03: 1100 [IU]/h via INTRAVENOUS
  Administered 2017-09-04 – 2017-09-05 (×2): 1150 [IU]/h via INTRAVENOUS
  Filled 2017-09-03 (×5): qty 250

## 2017-09-03 MED ORDER — FENTANYL CITRATE (PF) 100 MCG/2ML IJ SOLN
INTRAMUSCULAR | Status: AC
  Filled 2017-09-03: qty 2

## 2017-09-03 MED ORDER — INSULIN ASPART 100 UNIT/ML ~~LOC~~ SOLN
0.0000 [IU] | Freq: Three times a day (TID) | SUBCUTANEOUS | Status: DC
  Administered 2017-09-03: 3 [IU] via SUBCUTANEOUS
  Administered 2017-09-04 (×2): 2 [IU] via SUBCUTANEOUS
  Administered 2017-09-04: 3 [IU] via SUBCUTANEOUS
  Administered 2017-09-05: 2 [IU] via SUBCUTANEOUS

## 2017-09-03 MED ORDER — SODIUM CHLORIDE 0.9 % WEIGHT BASED INFUSION
3.0000 mL/kg/h | INTRAVENOUS | Status: DC
  Administered 2017-09-03: 3 mL/kg/h via INTRAVENOUS

## 2017-09-03 MED ORDER — SODIUM CHLORIDE 0.9 % IV SOLN: 250.0000 mL | INTRAVENOUS | Status: DC | PRN

## 2017-09-03 MED ORDER — HEPARIN SODIUM (PORCINE) 1000 UNIT/ML IJ SOLN
INTRAMUSCULAR | Status: DC | PRN
  Administered 2017-09-03: 5000 [IU] via INTRAVENOUS

## 2017-09-03 MED ORDER — ASPIRIN EC 81 MG PO TBEC
81.0000 mg | DELAYED_RELEASE_TABLET | Freq: Every evening | ORAL | Status: DC
  Administered 2017-09-04 – 2017-09-05 (×2): 81 mg via ORAL
  Filled 2017-09-03 (×2): qty 1

## 2017-09-03 MED ORDER — SODIUM CHLORIDE 0.9% FLUSH
3.0000 mL | Freq: Two times a day (BID) | INTRAVENOUS | Status: DC
  Administered 2017-09-03 – 2017-09-05 (×3): 3 mL via INTRAVENOUS

## 2017-09-03 MED ORDER — METOPROLOL TARTRATE 25 MG PO TABS: 25.0000 mg | ORAL_TABLET | Freq: Two times a day (BID) | ORAL | Status: DC

## 2017-09-03 SURGICAL SUPPLY — 9 items
CATH OPTITORQUE TIG 4.0 5F (CATHETERS) ×2 IMPLANT
DEVICE RAD COMP TR BAND LRG (VASCULAR PRODUCTS) ×2 IMPLANT
GLIDESHEATH SLEND A-KIT 6F 22G (SHEATH) ×2 IMPLANT
GUIDEWIRE INQWIRE 1.5J.035X260 (WIRE) ×1 IMPLANT
INQWIRE 1.5J .035X260CM (WIRE) ×2
KIT HEART LEFT (KITS) ×2 IMPLANT
PACK CARDIAC CATHETERIZATION (CUSTOM PROCEDURE TRAY) ×2 IMPLANT
TRANSDUCER W/STOPCOCK (MISCELLANEOUS) ×2 IMPLANT
TUBING CIL FLEX 10 FLL-RA (TUBING) ×2 IMPLANT

## 2017-09-03 NOTE — Progress Notes (Signed)
ANTICOAGULATION CONSULT NOTE - Initial Consult  Pharmacy Consult for heparin Indication: chest pain/ACS  No Known Allergies  Patient Measurements: Height: 5\' 11"  (180.3 cm) Weight: 206 lb (93.4 kg) IBW/kg (Calculated) : 75.3 Heparin Dosing Weight: 93kg  Vital Signs: Temp: 97.7 F (36.5 C) (07/22 0750) BP: 172/73 (07/22 1310) Pulse Rate: 67 (07/22 1310)  Labs: Recent Labs    08/31/17 1558 09/03/17 1115  HGB 12.5*  --   HCT 37.5  --   PLT 227  --   CREATININE 1.91* 1.71*    Estimated Creatinine Clearance: 48.9 mL/min (A) (by C-G formula based on SCr of 1.71 mg/dL (H)).   Medical History: Past Medical History:  Diagnosis Date  . Bigeminal rhythm 08/01/2017   per EKG 07-24-2017 in epic (per confirmation new since last ecg in 2007)  in care everywhere in epic ecg dated 2012 shows sinus brady rate 53 no pvc's --per pt asymptomatic and   . Bradycardia   . Family history of premature coronary artery disease 08/29/2017   Father - MI 39. Paternal uncle x 2 -1 with MI & other with CABG in 32s  . History of contusion    ED visit 08-04-2013 , 9 days past injury ,  dx concussion w/ LOC, per CT inferior frontal lobes contusion hemarrhage-- residual headache (08-01-2017  per pt residual resolved)  . History of kidney stones   . Hypercholesterolemia   . Hypertension   . Nephrolithiasis    bilateral nonobstructive per CT 07-24-2017  . Type 2 diabetes mellitus ([place])    followed by pcp  . Ureteral calculi    left  . Wears glasses   . Wears hearing aid in both ears      Assessment: 89 yoM s/p cath lab for positive stress test found to have severe 3vCAD. Pharmacy consulted to start IV heparin while awaiting TCTS recommendations 8hr after sheath pull (~1250 per cath log).   Goal of Therapy:  Heparin level 0.3-0.7 units/ml Monitor platelets by anticoagulation protocol: Yes   Plan:  -Start IV heparin 1100 units/hr with no bolus at 2100 tonight -Check 6hr heparin level -Daily  heparin level, CBC -F/U TCTS plans  Arrie Senate, PharmD, BCPS Clinical Pharmacist [phone] Please check AMION for all [place] numbers 09/03/2017

## 2017-09-03 NOTE — Consult Note (Signed)
[address]       [place] [address]             [phone]          CARDIOTHORACIC SURGERY CONSULTATION REPORT  PCP is [name], Taylor Him, MD Referring Provider is Leonie Man, MD  Reason for consultation:  Severe 3-vessel CAD  HPI:  Patient is a 67 year old male with no previous history of coronary artery disease but risk factors notable for history of type 2 diabetes mellitus, hypertension, hyperlipidemia, and a strong family history of premature coronary artery disease who has been referred for surgical consultation to discuss treatment options for management of newly discovered severe three-vessel coronary artery disease.  The patient was recently seen by his primary care physician and pulse rate noted to be bradycardic.  Given his past medical history and strong family history he was referred for elective cardiology consultation.  EKGs revealed PVCs and ventricular bigeminy with normal heart rate that made his pulse feel bradycardic.  He denied any history of exertional chest pain or shortness of breath until recently when he experienced a single episode of substernal chest discomfort that occurred while he was walking up a hill to a store.  He was evaluated by Dr. Ellyn Hack and referred for stress test which was performed August 31, 2017 and felt to be high risk study with medium size defect of moderate severity in the mid anterior apical and apical lateral views consistent with ischemia.  There was h horizontal ST segment depression noted during stress.  The patient was brought in for elective diagnostic cardiac catheterization today by Dr. Ellyn Hack and found to have severe three-vessel coronary artery disease with preserved left ventricular systolic function.  Cardiothoracic surgical consultation was requested.  The patient is married and lives locally in [place] with his family.  He has 2 adult children and several grandchildren.  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  He reports no physical limitations and states that he has remained physically active until presently.  He reports only a single episode of substernal chest discomfort as noted previously which resolved within 2 to 3 minutes of rest.  He otherwise denies any symptoms of chest pain or chest discomfort either with activity or at rest.  He denies any history of exertional shortness of breath, PND, orthopnea, or lower extremity edema.  He reports occasional palpitation without dizzy spells or syncope.  Past Medical History:  Diagnosis Date  . Bigeminal rhythm 08/01/2017   per EKG 07-24-2017 in epic (per confirmation new since last ecg in 2007)  in care everywhere in epic ecg dated 2012 shows sinus brady rate 53 no pvc's --per pt asymptomatic and   . Bradycardia   . Family history of premature coronary artery disease    Father - MI 28. Paternal uncle x 2 -1 with MI & other with CABG in 71s  . History of contusion    ED visit 08-04-2013 , 9 days past injury ,  dx concussion w/ LOC, per CT inferior frontal lobes contusion hemarrhage-- residual headache (08-01-2017  per pt residual resolved)  . History of kidney stones   . Hypercholesterolemia   . Hypertension   . Nephrolithiasis    bilateral nonobstructive per CT 07-24-2017  . Type 2 diabetes mellitus ([place])    followed by pcp  . Ureteral calculi    left  . Wears glasses   . Wears  hearing aid in both ears     Past Surgical History:  Procedure Laterality Date  . CYSTOSCOPY/RETROGRADE/URETEROSCOPY Left 08/06/2017   Procedure: CYSTOSCOPY/RETROGRADE/URETEROSCOPY laser and stone basket extrection;  Surgeon: Cleon Gustin, MD;  Location: [facility];  Service: Urology;  Laterality: Left;  . LAPAROSCOPIC APPENDECTOMY  11-18-2005    dr wyatt  [facility]  . LEFT HEART CATH AND CORONARY ANGIOGRAPHY N/A 09/03/2017   Procedure: LEFT HEART CATH AND CORONARY ANGIOGRAPHY;   Surgeon: Leonie Man, MD;  Location: [facility];  Service: Cardiovascular;  Laterality: N/A;  . ORCHIECTOMY  1965   unilateral -- per pt ruptured  . UMBILICAL HERNIA REPAIR  2006  . VARICOSE VEIN SURGERY  2007   left endovascular laser ablation    Family History  Problem Relation Age of Onset  . Stroke Mother   . Atrial fibrillation Mother   . Clotting disorder Father   . Heart attack Father 33       Died from complications  . Diabetes Daughter   . Heart disease Other   . Stroke Other   . Heart attack Paternal Uncle        In his 31s  . CAD Paternal Uncle        Has had either bypass surgery or stents    Social History   Socioeconomic History  . Marital status: Married    Spouse name: Not on file  . Number of children: Not on file  . Years of education: Not on file  . Highest education level: Not on file  Occupational History  . Not on file  Social Needs  . Financial resource strain: Not on file  . Food insecurity:    Worry: Not on file    Inability: Not on file  . Transportation needs:    Medical: Not on file    Non-medical: Not on file  Tobacco Use  . Smoking status: Never Smoker  . Smokeless tobacco: Never Used  Substance and Sexual Activity  . Alcohol use: No  . Drug use: No  . Sexual activity: Yes  Lifestyle  . Physical activity:    Days per week: Not on file    Minutes per session: Not on file  . Stress: Not on file  Relationships  . Social connections:    Talks on phone: Not on file    Gets together: Not on file    Attends religious service: Not on file    Active member of club or organization: Not on file    Attends meetings of clubs or organizations: Not on file    Relationship status: Not on file  . Intimate partner violence:    Fear of current or ex partner: Not on file    Emotionally abused: Not on file    Physically abused: Not on file    Forced sexual activity: Not on file  Other Topics Concern  . Not on file  Social  History Narrative  . Not on file    Prior to Admission medications   Medication Sig Start Date End Date Taking? Authorizing Provider  aspirin 81 MG tablet Take 81 mg by mouth every evening.    Yes [provider]  atorvastatin (LIPITOR) 20 MG tablet Take 20 mg by mouth every evening.    Yes [provider]  lisinopril (PRINIVIL,ZESTRIL) 40 MG tablet Take 40 mg by mouth every evening.    Yes [provider]  metoprolol tartrate (LOPRESSOR) 25 MG tablet  Take 0.5 tablets (12.5 mg total) by mouth 2 (two) times daily. 08/31/17 11/29/17 Yes Buford Dresser, MD  Multiple Vitamin (MULTIVITAMIN) tablet Take 1 tablet by mouth daily.   Yes [provider]  sitaGLIPtan-metformin (JANUMET) 50-500 MG per tablet Take 1 tablet by mouth at bedtime.    Yes [provider]    Current Facility-Administered Medications  Medication Dose Route Frequency Provider Last Rate Last Dose  . 0.9 %  sodium chloride infusion   Intravenous Continuous Leonie Man, MD 125 mL/hr at 09/03/17 1438    . 0.9 %  sodium chloride infusion  250 mL Intravenous PRN Leonie Man, MD      . amLODipine (NORVASC) tablet 5 mg  5 mg Oral Daily Leonie Man, MD   5 mg at 09/03/17 1458  . [START ON 09/04/2017] aspirin EC tablet 81 mg  81 mg Oral QPM Leonie Man, MD      . atorvastatin (LIPITOR) tablet 80 mg  80 mg Oral q1800 Leonie Man, MD   80 mg at 09/03/17 1726  . heparin ADULT infusion 100 units/mL (25000 units/281m sodium chloride 0.45%)  1,100 Units/hr Intravenous Continuous BEinar Grad RPH      . insulin aspart (novoLOG) injection 0-15 Units  0-15 Units Subcutaneous TID WC HLeonie Man MD   3 Units at 09/03/17 1725  . insulin aspart (novoLOG) injection 0-5 Units  0-5 Units Subcutaneous QHS HLeonie Man MD      . metoprolol tartrate (LOPRESSOR) tablet 25 mg  25 mg Oral BID HLeonie Man MD   25 mg at 09/03/17 1726  . ondansetron (ZOFRAN)  injection 4 mg  4 mg Intravenous Q6H PRN HLeonie Man MD      . sodium chloride flush (NS) 0.9 % injection 3 mL  3 mL Intravenous Q12H HLeonie Man MD   3 mL at 09/03/17 1731  . sodium chloride flush (NS) 0.9 % injection 3 mL  3 mL Intravenous PRN HLeonie Man MD        No Known Allergies    Review of Systems:   General:  normal appetite, normal energy, no weight gain, no weight loss, no fever  Cardiac:  + single episode chest pain with exertion, no chest pain at rest, noSOB with no exertion, no resting SOB, no PND, no orthopnea, occasional palpitations, no arrhythmia, no atrial fibrillation, no LE edema, no dizzy spells, no syncope  Respiratory:  no shortness of breath, no home oxygen, no productive cough, no dry cough, n bronchitis, no wheezing, no hemoptysis, no asthma, no pain with inspiration or cough, no sleep apnea, no CPAP at night  GI:   no difficulty swallowing, no reflux, no frequent heartburn, no hiatal hernia, no abdominal pain, no constipation, no diarrhea, no hematochezia, no hematemesis, no melena  GU:   no dysuria,  no frequency, no urinary tract infection, no hematuria, no enlarged prostate, + kidney stones, no kidney disease  Vascular:  no pain suggestive of claudication, no pain in feet, no leg cramps, no varicose veins, no DVT, no non-healing foot ulcer  Neuro:   no stroke, no TIA's, no seizures, no headaches, no temporary blindness one eye,  no slurred speech, no peripheral neuropathy, no chronic pain, no instability of gait, no memory/cognitive dysfunction  Musculoskeletal: no arthritis , no joint swelling, no myalgias, no difficulty walking, normal mobility   Skin:   no rash, no itching, no skin infections, no pressure sores or  ulcerations  Psych:   no anxiety, no depression, no nervousness, no unusual recent stress  Eyes:   no blurry vision, no floaters, no recent vision changes, + wears glasses or contacts  ENT:   no hearing loss, no loose or painful  teeth, no dentures, last saw dentist within the past year  Hematologic:  no easy bruising, no abnormal bleeding, no clotting disorder, no frequent epistaxis  Endocrine:  + diabetes, occasionally checks CBG's at home     Physical Exam:   BP (!) 148/78   Pulse 63   Temp 98 F (36.7 C) (Oral)   Resp 14   Ht _0  (1.803 m)   Wt 206 lb (93.4 kg)   SpO2 96%   BMI 28.73 kg/m   General:    well-appearing  HEENT:  Unremarkable   Neck:   no JVD, no bruits, no adenopathy   Chest:   clear to auscultation, symmetrical breath sounds, no wheezes, no rhonchi   CV:   RRR, no  murmur   Abdomen:  soft, non-tender, no masses   Extremities:  warm, well-perfused, pulses palpable, no lower extremity edema  Rectal/GU  Deferred  Neuro:   Grossly non-focal and symmetrical throughout  Skin:   Clean and dry, no rashes, no breakdown  Diagnostic Tests:  Lab Results: No results for input(s): WBC, HGB, HCT, PLT in the last 72 hours. BMET:  Recent Labs    09/03/17 1115  NA 141  K 4.8  CL 110  CO2 23  GLUCOSE 117*  BUN 24*  CREATININE 1.71*  CALCIUM 8.9    CBG (last 3)  Recent Labs    09/03/17 0756 09/03/17 1326 09/03/17 1617  GLUCAP 124* 111* 189*   PT/INR:  No results for input(s): LABPROT, INR in the last 72 hours.  CXR:  N/A   LEFT HEART CATH AND CORONARY ANGIOGRAPHY  Conclusion     Mid RCA lesion is 70% stenosed.  Dist RCA lesion is 99% stenosed with 95% stenosed side branch in Post Atrio. 1st RPLB lesion is 70% stenosed. Ost RPDA lesion is 90% stenosed.  Ost LAD to Prox LAD lesion is 40% stenosed. Prox LAD lesion is 60% stenosed with 90% stenosed side branch in Ost 1st Diag.  Prox LAD to Mid LAD lesion is 95% stenosed with 70% stenosed side branch in Ost 2nd Diag.  2nd Diag lesion is 70% stenosed.  Prox Cx lesion is 80% stenosed. Prox Cx to Mid Cx lesion is 90% stenosed. Mid Cx lesion is 85% stenosed.  Acute Mrg lesion is 80% stenosed.  There is mild left  ventricular systolic dysfunction.  LV end diastolic pressure is normal.  The left ventricular ejection fraction is 45-50% by visual estimate.  There is no aortic valve stenosis.   Severe multivessel CAD involving all major coronary arteries.  With severe multivessel disease and worsening symptoms with a highly positive stress test, I feel is probably prudent for him to be admitted and placed on IV heparin.  We will consult CVTS for CABG consult.  We will increase his beta-blocker dose, convert from ACE inhibitor to Norvasc based on renal insufficiency.  Would likely start a similar back post cath.   Sliding scale insulin. Increase statin to 80 mg atorvastatin.   Recommend Aspirin 17m daily for SEVERE MULTI-VESSEL CAD.     DGlenetta Hew M.D., M.S. Interventional Cardiologist   Pager # [phone] # [phone] [address] 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 male who was recently seen on 7/17 by Dr. Ellyn Hack for ventricular bigeminy. He has a very significant FH of CAD (father died of MI at age 41, uncle died of CAD in his 12s, another uncle with premature CAD). He noted a concern of chest burning when walking up a hill, and Dr. Ellyn Hack ordered a treadmill nuclear test to be performed today to evaluate this.  Nuclear test today (noted below) high risk, both on ECG and imaging. He was brought back for an urgent visit on Friday 7/19 (with Dr. Harrell Gave) to review these findings. They discussed the results of his stress test and the recommendation for cardiac cath scheduled for today.  Time Out: Verified patient identification, verified procedure, site/side was marked,  verified correct patient position, special equipment/implants available, medications/allergies/relevent history reviewed, required imaging and test results available. Performed. Consent Signed.   Access:  * RIGHT Radial Artery: 6 Fr sheath -- Seldinger technique using Angiocath Micropuncture Kit ** 10 mL radial cocktail IA; 5000 Units IV Heparin   Left Heart Catheterization: 5Fr TIG 4.0 Catheter was advanced or exchanged over a J-wire under direct fluoroscopic guidance into the ascending aorta; Aortic Valve was crossed for LV Hemodynamics & brief LV Gram..  * Left & Right Coronary Artery Cineangiography: TIG 4.0 Catheter  * LV Hemodynamics (LV Gram): TIG 4.0 Catheter   - After completion of angiography, the catheter was removed completely out of the body over wire without complication.  RADIAL Sheath(s) removed in the CATH LAB with TR Band placed for hemostasis.   TR Band: 1250 Hours; 14 mL air  MEDICATIONS * SQ Lidocaine 21m * Radial Cocktail: 3 mg Verapamil in 10 mL NS * Isovue Contrast: 66 * Heparin: 5000 Units  Fluoro time: 3.1 minutes. Dose Area Product: 19.6 Gycm2. Cumulative Air Kerma: 428.9 mGy.   Estimated blood loss <50 mL.  During this procedure the patient was administered the following to achieve and maintain moderate conscious sedation: Versed 2 mg, Fentanyl 50 mcg, while the patient's heart rate, blood pressure, and oxygen saturation were continuously monitored. The period of conscious sedation was 28 minutes, of which I was present face-to-face 100% of this time.  Coronary Findings   Diagnostic  Dominance: Right  Left Main  Vessel is large.  Left Anterior Descending  Vessel is normal in caliber.  Ost LAD to Prox LAD lesion 40% stenosed  Ost LAD to Prox LAD lesion is 40% stenosed. The lesion is eccentric. The lesion is calcified.  Prox LAD lesion 60% stenosed with side branch in Ost 1st Diag 90% stenosed  Prox LAD lesion is 60% stenosed with 90% stenosed side  branch in Ost 1st Diag. The lesion is segmental and eccentric. The lesion is calcified.  Prox LAD to Mid LAD lesion 95% stenosed with side branch in Ost 2nd Diag 70% stenosed  Prox LAD to Mid LAD lesion is 95% stenosed with 70% stenosed side branch in Ost 2nd Diag. The lesion is located at the bifurcation, irregular and ulcerative. The lesion is moderately calcified.  First Diagonal Branch  Vessel is small in size.  Second Diagonal Branch  2nd Diag lesion 70% stenosed  2nd Diag lesion is 70% stenosed. The lesion is eccentric and smooth.  Third DEngineer, production  Vessel is small in size.  Left Circumflex  Vessel is large.  Prox Cx lesion 80% stenosed  Prox Cx lesion is 80% stenosed. The lesion is eccentric and smooth.  Prox Cx to Mid Cx lesion 90% stenosed  Prox Cx to Mid Cx lesion is 90% stenosed. The lesion is tubular, eccentric and irregular.  Mid Cx lesion 85% stenosed  Mid Cx lesion is 85% stenosed. The lesion is distal to major branch and focal.  First Obtuse Marginal Branch  Vessel is small in size.  Second Obtuse Marginal Branch  Vessel is moderate in size. There is severe diffuse disease in the vessel.  Lateral Second Obtuse Marginal Branch  Vessel is small in size.  Left Atrioventricular Groove Continuation  Vessel is small in size.  Right Coronary Artery  Vessel is large. Large dominant vessel that gives rise to a large RV marginal branch that becomes the distal PDA. The actual PDA is relatively short and diffusely diseased. There is extensive disease at the location of the small PDA and PL system.  Mid RCA lesion 70% stenosed  Mid RCA lesion is 70% stenosed. The lesion is located proximal to the major branch and irregular.  Dist RCA lesion 99% stenosed with side branch in Post Atrio 95% stenosed  Dist RCA lesion is 99% stenosed with 95% stenosed side branch in Post Atrio. The lesion is located at the bifurcation, irregular and ulcerative.  Acute Marginal Branch  Vessel is  moderate in size.  Acute Mrg lesion 80% stenosed  Acute Mrg lesion is 80% stenosed. The lesion is discrete.  Right Posterior Descending Artery  Ost RPDA lesion 90% stenosed  Ost RPDA lesion is 90% stenosed. The lesion is located at the bifurcation.  First Right Posterolateral  Vessel is moderate in size.  1st RPLB lesion 70% stenosed  1st RPLB lesion is 70% stenosed. The lesion is tubular, eccentric and smooth.  Second Right Posterolateral  Vessel is small in size.  Intervention   No interventions have been documented.  Wall Motion   Resting               Left Heart   Left Ventricle The left ventricular size is in the upper limits of normal. There is mild left ventricular systolic dysfunction. LV end diastolic pressure is normal. The left ventricular ejection fraction is 45-50% by visual estimate. There are LV function abnormalities due to global hypokinesis.  Aortic Valve There is no aortic valve stenosis. There is normal aortic valve motion.  Coronary Diagrams   Diagnostic Diagram       Implants    No implant documentation for this case.  MERGE Images   Show images for CARDIAC CATHETERIZATION   Link to Procedure Log   Procedure Log    Hemo Data    Most Recent Value  AO Systolic Pressure 696 mmHg  AO Diastolic Pressure 73 mmHg  AO Mean 295 mmHg  LV Systolic Pressure 284 mmHg  LV Diastolic Pressure 6 mmHg  LV EDP 12 mmHg  AOp Systolic Pressure 132 mmHg  AOp Diastolic Pressure 66 mmHg  AOp Mean Pressure 97 mmHg  LVp Systolic Pressure 440 mmHg  LVp Diastolic Pressure 6 mmHg  LVp EDP Pressure 15 mmHg      Impression:  I have personally reviewed results of the patient's recent nuclear stress test and diagnostic cardiac catheterization.  He has severe three-vessel coronary artery disease with preserved left ventricular systolic function.  He reports a single episode of substernal chest discomfort  that occurred recently during exertion and resolved within 2  to 3 minutes of rest.  He otherwise reports no significant symptoms of chest discomfort or shortness of breath.  I agree the patient would best be treated with surgical revascularization.   Plan:  I have reviewed the indications, risks, and potential benefits of coronary artery bypass grafting with the patient and his family this evening.  Alternative treatment strategies have been discussed, including the relative risks, benefits and long term prognosis associated with medical therapy, percutaneous coronary intervention, and surgical revascularization.  The patient understands and accepts all potential associated risks of surgery including but not limited to risk of death, stroke or other neurologic complication, myocardial infarction, congestive heart failure, respiratory failure, renal failure, bleeding requiring blood transfusion and/or reexploration, aortic dissection or other major vascular complication, arrhythmia, heart block or bradycardia requiring permanent pacemaker, pneumonia, pleural effusion, wound infection, pulmonary embolus or other thromboembolic complication, chronic pain or other delayed complications related to median sternotomy, or the late recurrence of symptomatic ischemic heart disease and/or congestive heart failure.  The importance of long term risk modification have been emphasized.  All questions answered.  We tentatively plan to proceed with surgery on Thursday, September 06, 2017.   I spent in excess of 120 minutes during the conduct of this hospital consultation and >50% of this time involved direct face-to-face encounter for counseling and/or coordination of the patient's care.    Valentina Gu. Roxy Manns, MD 09/03/2017 6:55 PM

## 2017-09-03 NOTE — Interval H&P Note (Signed)
History and Physical Interval Note:  09/03/2017 12:13 PM  Taylor [name]  has presented today for surgery, with the diagnosis of abn stress WITH Class II Angina.    The various methods of treatment have been discussed with the patient and family. After consideration of risks, benefits and other options for treatment, the patient has consented to  Procedure(s): LEFT HEART CATH AND CORONARY ANGIOGRAPHY (N/A) with possible PERCUTANEOUS CORONARY INTERVENTION as a surgical intervention .  The patient's history has been reviewed, patient examined, no change in status, stable for surgery.  I have reviewed the patient's chart and labs.  Questions were answered to the patient's satisfaction.    Cath Lab Visit (complete for each Cath Lab visit)  Clinical Evaluation Leading to the Procedure:   ACS: No.  Non-ACS:    Anginal Classification: CCS II  Anti-ischemic medical therapy: Minimal Therapy (1 class of medications)  Non-Invasive Test Results: High-risk stress test findings: cardiac mortality >3%/year  Prior CABG: No previous CABG   [name]

## 2017-09-04 ENCOUNTER — Inpatient Hospital Stay (HOSPITAL_COMMUNITY): Payer: PPO

## 2017-09-04 ENCOUNTER — Other Ambulatory Visit: Payer: Self-pay | Admitting: *Deleted

## 2017-09-04 DIAGNOSIS — I10 Essential (primary) hypertension: Secondary | ICD-10-CM

## 2017-09-04 DIAGNOSIS — I361 Nonrheumatic tricuspid (valve) insufficiency: Secondary | ICD-10-CM

## 2017-09-04 DIAGNOSIS — I25119 Atherosclerotic heart disease of native coronary artery with unspecified angina pectoris: Secondary | ICD-10-CM

## 2017-09-04 DIAGNOSIS — R9439 Abnormal result of other cardiovascular function study: Secondary | ICD-10-CM

## 2017-09-04 DIAGNOSIS — E119 Type 2 diabetes mellitus without complications: Secondary | ICD-10-CM

## 2017-09-04 DIAGNOSIS — I25110 Atherosclerotic heart disease of native coronary artery with unstable angina pectoris: Principal | ICD-10-CM

## 2017-09-04 DIAGNOSIS — Z01810 Encounter for preprocedural cardiovascular examination: Secondary | ICD-10-CM

## 2017-09-04 HISTORY — PX: TRANSTHORACIC ECHOCARDIOGRAM: SHX275

## 2017-09-04 LAB — PULMONARY FUNCTION TEST
DL/VA % pred: 92 %
DL/VA: 4.29 ml/min/mmHg/L
DLCO cor % pred: 66 %
DLCO cor: 22.31 ml/min/mmHg
DLCO unc % pred: 63 %
DLCO unc: 21.45 ml/min/mmHg
FEF 25-75 Post: 4.33 L/sec
FEF 25-75 Pre: 4.2 L/sec
FEF2575-%Change-Post: 3 %
FEF2575-%Pred-Post: 160 %
FEF2575-%Pred-Pre: 155 %
FEV1-%Change-Post: 1 %
FEV1-%Pred-Post: 97 %
FEV1-%Pred-Pre: 96 %
FEV1-Post: 3.39 L
FEV1-Pre: 3.35 L
FEV1FVC-%Change-Post: 1 %
FEV1FVC-%Pred-Pre: 115 %
FEV6-%Change-Post: 0 %
FEV6-%Pred-Post: 87 %
FEV6-%Pred-Pre: 87 %
FEV6-Post: 3.91 L
FEV6-Pre: 3.9 L
FEV6FVC-%Change-Post: 0 %
FEV6FVC-%Pred-Post: 105 %
FEV6FVC-%Pred-Pre: 105 %
FVC-%Change-Post: 0 %
FVC-%Pred-Post: 83 %
FVC-%Pred-Pre: 83 %
FVC-Post: 3.91 L
FVC-Pre: 3.91 L
Post FEV1/FVC ratio: 87 %
Post FEV6/FVC ratio: 100 %
Pre FEV1/FVC ratio: 86 %
Pre FEV6/FVC Ratio: 100 %
RV % pred: 58 %
RV: 1.43 L
TLC % pred: 76 %
TLC: 5.55 L

## 2017-09-04 LAB — URINALYSIS, MICROSCOPIC (REFLEX): Squamous Epithelial / LPF: NONE SEEN (ref 0–5)

## 2017-09-04 LAB — ECHOCARDIOGRAM COMPLETE
Height: 71 in
Weight: 3278.4 oz

## 2017-09-04 LAB — CBC
HCT: 40.3 % (ref 39.0–52.0)
Hemoglobin: 13.3 g/dL (ref 13.0–17.0)
MCH: 28.9 pg (ref 26.0–34.0)
MCHC: 33 g/dL (ref 30.0–36.0)
MCV: 87.6 fL (ref 78.0–100.0)
Platelets: 220 10*3/uL (ref 150–400)
RBC: 4.6 MIL/uL (ref 4.22–5.81)
RDW: 13.2 % (ref 11.5–15.5)
WBC: 6.4 10*3/uL (ref 4.0–10.5)

## 2017-09-04 LAB — PROTIME-INR
INR: 1
Prothrombin Time: 13.1 seconds (ref 11.4–15.2)

## 2017-09-04 LAB — URINALYSIS, ROUTINE W REFLEX MICROSCOPIC
Bilirubin Urine: NEGATIVE
Glucose, UA: NEGATIVE mg/dL
Ketones, ur: NEGATIVE mg/dL
Nitrite: NEGATIVE
Protein, ur: 30 mg/dL — AB
Specific Gravity, Urine: 1.01 (ref 1.005–1.030)
pH: 7 (ref 5.0–8.0)

## 2017-09-04 LAB — COMPREHENSIVE METABOLIC PANEL
ALT: 14 U/L (ref 0–44)
AST: 16 U/L (ref 15–41)
Albumin: 3.8 g/dL (ref 3.5–5.0)
Alkaline Phosphatase: 83 U/L (ref 38–126)
Anion gap: 7 (ref 5–15)
BUN: 22 mg/dL (ref 8–23)
CO2: 26 mmol/L (ref 22–32)
Calcium: 9.4 mg/dL (ref 8.9–10.3)
Chloride: 107 mmol/L (ref 98–111)
Creatinine, Ser: 1.69 mg/dL — ABNORMAL HIGH (ref 0.61–1.24)
GFR calc Af Amer: 47 mL/min — ABNORMAL LOW (ref >60)
GFR calc non Af Amer: 40 mL/min — ABNORMAL LOW (ref >60)
Glucose, Bld: 140 mg/dL — ABNORMAL HIGH (ref 70–99)
Potassium: 4.4 mmol/L (ref 3.5–5.1)
Sodium: 140 mmol/L (ref 135–145)
Total Bilirubin: 1.1 mg/dL (ref 0.3–1.2)
Total Protein: 6.9 g/dL (ref 6.5–8.1)

## 2017-09-04 LAB — HEMOGLOBIN A1C
Hgb A1c MFr Bld: 7.6 % — ABNORMAL HIGH (ref 4.8–5.6)
Mean Plasma Glucose: 171.42 mg/dL

## 2017-09-04 LAB — GLUCOSE, CAPILLARY
Glucose-Capillary: 137 mg/dL — ABNORMAL HIGH (ref 70–99)
Glucose-Capillary: 132 mg/dL — ABNORMAL HIGH (ref 70–99)
Glucose-Capillary: 193 mg/dL — ABNORMAL HIGH (ref 70–99)
Glucose-Capillary: 122 mg/dL — ABNORMAL HIGH (ref 70–99)

## 2017-09-04 LAB — TYPE AND SCREEN
ABO/RH(D): O POS
Antibody Screen: NEGATIVE

## 2017-09-04 LAB — PREALBUMIN: Prealbumin: 32 mg/dL (ref 18–38)

## 2017-09-04 LAB — HEPARIN LEVEL (UNFRACTIONATED): Heparin Unfractionated: 0.3 IU/mL (ref 0.30–0.70)

## 2017-09-04 LAB — ABO/RH: ABO/RH(D): O POS

## 2017-09-04 LAB — APTT: aPTT: 45 seconds — ABNORMAL HIGH (ref 24–36)

## 2017-09-04 MED ORDER — ALBUTEROL SULFATE (2.5 MG/3ML) 0.083% IN NEBU
2.5000 mg | INHALATION_SOLUTION | Freq: Once | RESPIRATORY_TRACT | Status: AC
  Administered 2017-09-04: 2.5 mg via RESPIRATORY_TRACT

## 2017-09-04 MED FILL — Labetalol HCl IV Soln Prefilled Syringe 20 MG/4ML (5 MG/ML): INTRAVENOUS | Qty: 4 | Status: AC

## 2017-09-04 NOTE — Progress Notes (Signed)
[address]       [place] [address]             [phone]     CARDIOTHORACIC SURGERY PROGRESS NOTE  1 Day Post-Op  S/P Procedure(s) (LRB): LEFT HEART CATH AND CORONARY ANGIOGRAPHY (N/A)  Subjective: No complaints  Objective: Vital signs in last 24 hours: Temp:  [98 F (36.7 C)-98.4 F (36.9 C)] 98.4 F (36.9 C) (07/23 0500) Pulse Rate:  [66-70] 70 (07/23 0500) Cardiac Rhythm: Normal sinus rhythm (07/23 0943) Resp:  [18] 18 (07/23 0500) BP: (130-176)/(51-82) 176/82 (07/23 0819) SpO2:  [95 %-96 %] 96 % (07/23 0500) Weight:  [204 lb 14.4 oz (92.9 kg)] 204 lb 14.4 oz (92.9 kg) (07/23 0500)  Physical Exam:  Rhythm:   Sinus w/ PVC's  Breath sounds: clear  Heart sounds:  RRR  Incisions:  n/a  Abdomen:  soft  Extremities:  warm   Intake/Output from previous day: 07/22 0701 - 07/23 0700 In: 1373.8 [P.O.:480; I.V.:893.8] Out: 875 [Urine:875] Intake/Output this shift: Total I/O In: 240 [P.O.:240] Out: 200 [Urine:200]  Lab Results: Recent Labs    09/04/17 0844  WBC 6.4  HGB 13.3  HCT 40.3  PLT 220   BMET:  Recent Labs    09/03/17 1115 09/04/17 1117  NA 141 140  K 4.8 4.4  CL 110 107  CO2 23 26  GLUCOSE 117* 140*  BUN 24* 22  CREATININE 1.71* 1.69*  CALCIUM 8.9 9.4    CBG (last 3)  Recent Labs    09/03/17 2132 09/04/17 0748 09/04/17 1124  GLUCAP 154* 137* 132*   PT/INR:   Recent Labs    09/04/17 1117  LABPROT 13.1  INR 1.00    CXR:  CHEST - 2 VIEW  COMPARISON:  Chest x-ray of November 20, 2006  FINDINGS: The lungs are adequately inflated. There is no focal infiltrate. There is no pleural effusion. The heart and pulmonary vascularity are normal. There is calcification in the wall of the aortic arch. The bony thorax exhibits no acute abnormality.  IMPRESSION: There is no acute cardiopulmonary abnormality.  Thoracic aortic atherosclerosis.   Electronically Signed   By: David  Martinique M.D.   On:  09/04/2017 13:21   Transthoracic Echocardiography  Patient:    Taylor [name], Cada MR #:       [id number] Study Date: 09/04/2017 Gender:     M Age:        79 Height:     180.3 cm Weight:     92.9 kg BSA:        2.18 m^2 Pt. Status: Room:       6E29C   ADMITTING    [name], MD  ATTENDING    [name], MD  ORDERING     [name], MD  REFERRING    [name], MD  PERFORMING   [facility], Inpatient  SONOGRAPHER  Mikki Santee  cc:  ------------------------------------------------------------------- LV EF: 40% -   45%  ------------------------------------------------------------------- Indications:      CAD of native vessels 414.01.  ------------------------------------------------------------------- History:   Risk factors:  Hypertension. Diabetes mellitus.  ------------------------------------------------------------------- Study Conclusions  - Left ventricle: The cavity size was moderately dilated. Systolic   function was mildly to moderately reduced. The estimated ejection   fraction was in the range of 40% to 45%. Diffuse hypokinesis. The   study is not technically sufficient to allow evaluation of LV   diastolic function. - Aortic valve: Transvalvular velocity  was within the normal range.   There was no stenosis. There was no regurgitation. - Mitral valve: Transvalvular velocity was within the normal range.   There was no evidence for stenosis. There was trivial   regurgitation. - Left atrium: The atrium was severely dilated. - Right ventricle: The cavity size was normal. Wall thickness was   normal. Systolic function was normal. - Atrial septum: No defect or patent foramen ovale was identified   by color flow Doppler. - Tricuspid valve: There was mild regurgitation. - Pulmonary arteries: Systolic pressure was mildly increased. PA   peak pressure: 44 mm Hg (S).  Impressions:  - Frequent PVCs may lead to underestimation of left  ventricular   systolic function.  ------------------------------------------------------------------- Study data:  No prior study was available for comparison.  Study status:  Routine.  Procedure:  The patient reported no pain pre or post test. Transthoracic echocardiography. Image quality was adequate.  Study completion:  There were no complications. Transthoracic echocardiography.  M-mode, complete 2D, spectral Doppler, and color Doppler.  Birthdate:  Patient birthdate: 03-29-1950.  Age:  Patient is 67 yr old.  Sex:  Gender: male. BMI: 28.6 kg/m^2.  Blood pressure:     176/82  Patient status: Inpatient.  Study date:  Study date: 09/04/2017. Study time: 10:15 AM.  Location:  [facility] laboratory.  -------------------------------------------------------------------  ------------------------------------------------------------------- Left ventricle:  The cavity size was moderately dilated. Systolic function was mildly to moderately reduced. The estimated ejection fraction was in the range of 40% to 45%. Diffuse hypokinesis. The study is not technically sufficient to allow evaluation of LV diastolic function.  ------------------------------------------------------------------- Aortic valve:   Trileaflet; mildly thickened, mildly calcified leaflets. Mobility was not restricted.  Doppler:  Transvalvular velocity was within the normal range. There was no stenosis. There was no regurgitation.  ------------------------------------------------------------------- Aorta:  Aortic root: The aortic root was normal in size.  ------------------------------------------------------------------- Mitral valve:   Structurally normal valve.   Mobility was not restricted.  Doppler:  Transvalvular velocity was within the normal range. There was no evidence for stenosis. There was trivial regurgitation.  ------------------------------------------------------------------- Left atrium:  The atrium  was severely dilated.  ------------------------------------------------------------------- Atrial septum:  No defect or patent foramen ovale was identified by color flow Doppler.  ------------------------------------------------------------------- Right ventricle:  The cavity size was normal. Wall thickness was normal. Systolic function was normal.  ------------------------------------------------------------------- Pulmonic valve:    Structurally normal valve.   Cusp separation was normal.  Doppler:  Transvalvular velocity was within the normal range. There was no evidence for stenosis. There was no regurgitation.  ------------------------------------------------------------------- Tricuspid valve:   Structurally normal valve.    Doppler: Transvalvular velocity was within the normal range. There was mild regurgitation.  ------------------------------------------------------------------- Pulmonary artery:   The main pulmonary artery was normal-sized. Systolic pressure was mildly increased.  ------------------------------------------------------------------- Right atrium:  The atrium was normal in size.  ------------------------------------------------------------------- Pericardium:  There was no pericardial effusion.  ------------------------------------------------------------------- Systemic veins: Inferior vena cava: Poorly visualized. The vessel was dilated. The respirophasic diameter changes were in the normal range (>= 50%), consistent with elevated central venous pressure.  ------------------------------------------------------------------- Measurements   Left ventricle                         Value        Reference  LV ID, ED, PLAX chordal        (H)     64.2  mm     43 - 52  LV  ID, ES, PLAX chordal        (H)     52.3  mm     23 - 38  LV fx shortening, PLAX chordal (L)     19    %      >=29  LV PW thickness, ED                    11.1  mm      ----------  IVS/LV PW ratio, ED                    0.75         <=1.3  Stroke volume, 2D                      93    ml     ----------  Stroke volume/bsa, 2D                  43    ml/m^2 ----------  LV e&', medial                          7.18  cm/s   ----------    Ventricular septum                     Value        Reference  IVS thickness, ED                      8.35  mm     ----------    LVOT                                   Value        Reference  LVOT ID, S                             25    mm     ----------  LVOT area                              4.91  cm^2   ----------  LVOT peak velocity, S                  84.9  cm/s   ----------  LVOT mean velocity, S                  51.1  cm/s   ----------  LVOT VTI, S                            19    cm     ----------    Aorta                                  Value        Reference  Aortic root ID, ED                     34    mm     ----------    Left atrium  Value        Reference  LA ID, A-P, ES                         45    mm     ----------  LA ID/bsa, A-P                         2.07  cm/m^2 <=2.2  LA volume, S                           118   ml     ----------  LA volume/bsa, S                       54.2  ml/m^2 ----------  LA volume, ES, 1-p A4C                 109   ml     ----------  LA volume/bsa, ES, 1-p A4C             50.1  ml/m^2 ----------  LA volume, ES, 1-p A2C                 117   ml     ----------  LA volume/bsa, ES, 1-p A2C             53.8  ml/m^2 ----------    Pulmonary arteries                     Value        Reference  PA pressure, S, DP             (H)     44    mm Hg  <=30    Tricuspid valve                        Value        Reference  Tricuspid regurg peak velocity         299   cm/s   ----------  Tricuspid peak RV-RA gradient          36    mm Hg  ----------    Right atrium                           Value        Reference  RA ID, S-I, ES, A4C            (H)     62.8  mm      34 - 49  RA area, ES, A4C                       16.1  cm^2   8.3 - 19.5  RA volume, ES, A/L                     34.4  ml     ----------  RA volume/bsa, ES, A/L                 15.8  ml/m^2 ----------    Systemic veins                         Value  Reference  Estimated CVP                          8     mm Hg  ----------    Right ventricle                        Value        Reference  TAPSE                                  14.1  mm     ----------  RV pressure, S, DP             (H)     44    mm Hg  <=30  RV s&', lateral, S                      11.7  cm/s   ----------  Legend: (L)  and  (H)  mark values outside specified reference range.  ------------------------------------------------------------------- Prepared and Electronically Authenticated by  Skeet Latch, MD 2019-07-23T13:32:36  Assessment/Plan: S/P Procedure(s) (LRB): LEFT HEART CATH AND CORONARY ANGIOGRAPHY (N/A)  For CABG on Thursday 7/25  Rexene Alberts, MD 09/04/2017 2:38 PM

## 2017-09-04 NOTE — Progress Notes (Signed)
Progress Note  Patient Name: Taylor [name] Date of Encounter: 09/04/2017  Primary Cardiologist: [name], MD   Subjective   Currently chest pain-free, no shortness of breath.  Awaiting CABG Thursday  Inpatient Medications    Scheduled Meds: . amLODipine  5 mg Oral Daily  . aspirin EC  81 mg Oral QPM  . atorvastatin  80 mg Oral q1800  . insulin aspart  0-15 Units Subcutaneous TID WC  . insulin aspart  0-5 Units Subcutaneous QHS  . metoprolol tartrate  25 mg Oral BID  . sodium chloride flush  3 mL Intravenous Q12H   Continuous Infusions: . sodium chloride    . heparin 1,100 Units/hr (09/04/17 0606)   PRN Meds: sodium chloride, ondansetron (ZOFRAN) IV, sodium chloride flush   Vital Signs    Vitals:   09/03/17 1725 09/03/17 2007 09/04/17 0500 09/04/17 0819  BP: (!) 148/78 (!) 130/51 (!) 155/73 (!) 176/82  Pulse:  66 70   Resp:  18 18   Temp:  98 F (36.7 C) 98.4 F (36.9 C)   TempSrc:  Oral Oral   SpO2:  95% 96%   Weight:   204 lb 14.4 oz (92.9 kg)   Height:        Intake/Output Summary (Last 24 hours) at 09/04/2017 0847 Last data filed at 09/04/2017 0606 Gross per 24 hour  Intake 1373.82 ml  Output 875 ml  Net 498.82 ml   Filed Weights   09/03/17 0750 09/04/17 0500  Weight: 206 lb (93.4 kg) 204 lb 14.4 oz (92.9 kg)    Telemetry    No adverse arrhythmias- Personally Reviewed  ECG    No new- Personally Reviewed  Physical Exam   GEN: No acute distress.   Neck: No JVD Cardiac: RRR, no murmurs, rubs, or gallops.  Cardiac cath site normal Respiratory: Clear to auscultation bilaterally. GI: Soft, nontender, non-distended  MS: No edema; No deformity. Neuro:  Nonfocal  Psych: Normal affect   Labs    Chemistry Recent Labs  Lab 08/31/17 1558 09/03/17 1115  NA 142 141  K 5.0 4.8  CL 104 110  CO2 23 23  GLUCOSE 110* 117*  BUN 21 24*  CREATININE 1.91* 1.71*  CALCIUM 9.4 8.9  GFRNONAA 35* 40*  GFRAA 41* 27*  ANIONGAP  --  8      Hematology Recent Labs  Lab 08/31/17 1558  WBC 5.9  RBC 4.38  HGB 12.5*  HCT 37.5  MCV 86  MCH 28.5  MCHC 33.3  RDW 13.2  PLT 227    Cardiac EnzymesNo results for input(s): TROPONINI in the last 168 hours. No results for input(s): TROPIPOC in the last 168 hours.   BNPNo results for input(s): BNP, PROBNP in the last 168 hours.   DDimer No results for input(s): DDIMER in the last 168 hours.   Radiology    No results found.  Cardiac Studies   Cardiac catheterization 09/03/2017-severe multivessel coronary artery disease with highly positive stress test. Diagnostic Diagram          Patient Profile     67 y.o. male with severe multivessel coronary artery disease awaiting CABG, Dr. Roxy Manns  Assessment & Plan    Severe coronary artery disease - Reviewed Dr. Guy Sandifer consult note.  Awaiting CABG Thursday.  Cardiac catheterization report reviewed.  Highly positive stress test.  Diabetes with hypertension hyperlipidemia and strong family history of premature coronary artery disease - Continue with current medication strategy.  Prevention.  Now on  high intensity statin dose.  PVC, ventricular bigeminy, occasional non-sustained ventricular tachycardia.  For questions or updates, please contact [place] Please consult [email or web address] for contact info under Cardiology/STEMI.      Signed, Candee Furbish, MD  09/04/2017, 8:47 AM

## 2017-09-04 NOTE — Progress Notes (Addendum)
Pre CABG Doppler prelim:  Right Carotid:Velocities in the right ICA are consistent with a 1-39% stenosis.  Left Carotid: Velocities in the left ICA are consistent with a 40-59% stenosis.    Right ABI: Resting right ankle-brachial index is within normal range. No evidence of significant right lower extremity arterial disease.  Left ABI: Resting left ankle-brachial index is within normal range. No evidence of significant left lower extremity arterial disease.    Bilateral Extremity: Doppler waveforms remain within normal limits with compression bilaterally for the radial arteries. Doppler waveforms remain within normal limits with compression bilaterally for the ulnar arteries.    Landry Mellow, RDMS, RVT

## 2017-09-04 NOTE — Progress Notes (Signed)
Order received by Dr Roxy Manns for pre-op ABG. Order stated "tomorrow morning" but was dated for today and for now. RT called RN to clarify when pt was going to surgery and RN stated it was Thursday. Order modified to reflect original order for ABG on Wednesday AM

## 2017-09-04 NOTE — Progress Notes (Signed)
Pt declined ambulation, sts he has had a busy morning and wanted to rest. Encouraged him to ambulate later after he rested. Discussed sternal precautions, IS (1750 mL), mobility, and d/c planning. Good reception, wife will be with him at d/c. Gave OHS booklet, care guide and Move in the Tube information. Also gave instruction on watching preop video. Pt went into trigeminy while talking in bed (had been in NSR). He also had a short run of VT. [place], ACSM 2:19 PM 09/04/2017

## 2017-09-04 NOTE — Progress Notes (Signed)
  Echocardiogram 2D Echocardiogram has been performed.  Jennette Dubin 09/04/2017, 11:13 AM

## 2017-09-04 NOTE — Progress Notes (Signed)
ANTICOAGULATION CONSULT NOTE - Follow Up Consult  Pharmacy Consult for heparin Indication: chest pain/ACS for CABG  No Known Allergies  Patient Measurements: Height: 5\' 11"  (180.3 cm) Weight: 204 lb 14.4 oz (92.9 kg) IBW/kg (Calculated) : 75.3 Heparin Dosing Weight: 93kg  Vital Signs: Temp: 98.4 F (36.9 C) (07/23 0500) Temp Source: Oral (07/23 0500) BP: 176/82 (07/23 0819) Pulse Rate: 70 (07/23 0500)  Labs: Recent Labs    09/03/17 1115 09/04/17 0844  HGB  --  13.3  HCT  --  40.3  PLT  --  220  HEPARINUNFRC  --  0.30  CREATININE 1.71*  --     Estimated Creatinine Clearance: 48.8 mL/min (A) (by C-G formula based on SCr of 1.71 mg/dL (H)).   Medical History: Past Medical History:  Diagnosis Date  . Bigeminal rhythm 08/01/2017   per EKG 07-24-2017 in epic (per confirmation new since last ecg in 2007)  in care everywhere in epic ecg dated 2012 shows sinus brady rate 53 no pvc's --per pt asymptomatic and   . Bradycardia   . Family history of premature coronary artery disease    Father - MI 69. Paternal uncle x 2 -1 with MI & other with CABG in 57s  . History of contusion    ED visit 08-04-2013 , 9 days past injury ,  dx concussion w/ LOC, per CT inferior frontal lobes contusion hemarrhage-- residual headache (08-01-2017  per pt residual resolved)  . History of kidney stones   . Hypercholesterolemia   . Hypertension   . Nephrolithiasis    bilateral nonobstructive per CT 07-24-2017  . Type 2 diabetes mellitus ([place])    followed by pcp  . Ureteral calculi    left  . Wears glasses   . Wears hearing aid in both ears      Assessment: 37 yoM s/p cath lab for positive stress test found to have severe 3vCAD. Pharmacy dosing IV heparin while awaiting CABG. HL this AM is low therapeutic at 0.3. H/H and plt wnl. No bleeding noted per RN  Goal of Therapy:  Heparin level 0.3-0.7 units/ml Monitor platelets by anticoagulation protocol: Yes   Plan:  -Increase heparin  slightly to 1150 units/hr -Daily heparin level, CBC -For CABG on thursday  Albertina Parr, PharmD., BCPS Clinical Pharmacist Clinical phone for 09/04/17 until 3:30pm: [phone] If after 3:30pm, please refer to [facility] for unit-specific pharmacist

## 2017-09-05 LAB — CBC
HCT: 39.8 % (ref 39.0–52.0)
Hemoglobin: 12.9 g/dL — ABNORMAL LOW (ref 13.0–17.0)
MCH: 28.5 pg (ref 26.0–34.0)
MCHC: 32.4 g/dL (ref 30.0–36.0)
MCV: 87.9 fL (ref 78.0–100.0)
Platelets: 212 10*3/uL (ref 150–400)
RBC: 4.53 MIL/uL (ref 4.22–5.81)
RDW: 13.2 % (ref 11.5–15.5)
WBC: 5.4 10*3/uL (ref 4.0–10.5)

## 2017-09-05 LAB — MRSA PCR SCREENING: MRSA by PCR: NEGATIVE

## 2017-09-05 LAB — BLOOD GAS, ARTERIAL
Acid-Base Excess: 0.6 mmol/L (ref 0.0–2.0)
Bicarbonate: 24.3 mmol/L (ref 20.0–28.0)
Drawn by: 44135
FIO2: 21
O2 Saturation: 95.8 %
Patient temperature: 98.6
pCO2 arterial: 36.9 mmHg (ref 32.0–48.0)
pH, Arterial: 7.435 (ref 7.350–7.450)
pO2, Arterial: 80.8 mmHg — ABNORMAL LOW (ref 83.0–108.0)

## 2017-09-05 LAB — GLUCOSE, CAPILLARY
Glucose-Capillary: 138 mg/dL — ABNORMAL HIGH (ref 70–99)
Glucose-Capillary: 107 mg/dL — ABNORMAL HIGH (ref 70–99)
Glucose-Capillary: 120 mg/dL — ABNORMAL HIGH (ref 70–99)
Glucose-Capillary: 129 mg/dL — ABNORMAL HIGH (ref 70–99)

## 2017-09-05 LAB — HEPARIN LEVEL (UNFRACTIONATED): Heparin Unfractionated: 0.42 IU/mL (ref 0.30–0.70)

## 2017-09-05 MED ORDER — SODIUM CHLORIDE 0.9 % IV SOLN
INTRAVENOUS | Status: AC
  Administered 2017-09-06: 1 [IU]/h via INTRAVENOUS
  Filled 2017-09-05: qty 1

## 2017-09-05 MED ORDER — POTASSIUM CHLORIDE 2 MEQ/ML IV SOLN
80.0000 meq | INTRAVENOUS | Status: DC
  Filled 2017-09-05: qty 40

## 2017-09-05 MED ORDER — TRANEXAMIC ACID (OHS) PUMP PRIME SOLUTION
2.0000 mg/kg | INTRAVENOUS | Status: DC
  Filled 2017-09-05: qty 1.83

## 2017-09-05 MED ORDER — SODIUM CHLORIDE 0.9 % IV SOLN
750.0000 mg | INTRAVENOUS | Status: AC
  Administered 2017-09-06: 750 mg via INTRAVENOUS
  Filled 2017-09-05: qty 750

## 2017-09-05 MED ORDER — VANCOMYCIN HCL 1000 MG IV SOLR
INTRAVENOUS | Status: AC
  Administered 2017-09-06: 1000 mL
  Filled 2017-09-05: qty 1000

## 2017-09-05 MED ORDER — SODIUM CHLORIDE 0.9 % IV SOLN: 0.2500 ug/kg/min | INTRAVENOUS | Status: DC

## 2017-09-05 MED ORDER — TRANEXAMIC ACID (OHS) BOLUS VIA INFUSION
15.0000 mg/kg | INTRAVENOUS | Status: AC
  Administered 2017-09-06: 1371 mg via INTRAVENOUS
  Filled 2017-09-05: qty 1371

## 2017-09-05 MED ORDER — CEFUROXIME SODIUM 1.5 G IV SOLR
1.5000 g | INTRAVENOUS | Status: AC
  Administered 2017-09-06: 1.5 g via INTRAVENOUS
  Filled 2017-09-05: qty 1.5

## 2017-09-05 MED ORDER — TRANEXAMIC ACID 1000 MG/10ML IV SOLN
1.5000 mg/kg/h | INTRAVENOUS | Status: AC
  Administered 2017-09-06: 1.5 mg/kg/h via INTRAVENOUS
  Filled 2017-09-05: qty 25

## 2017-09-05 MED ORDER — NITROGLYCERIN IN D5W 200-5 MCG/ML-% IV SOLN
2.0000 ug/min | INTRAVENOUS | Status: DC
  Filled 2017-09-05: qty 250

## 2017-09-05 MED ORDER — MILRINONE LACTATE IN DEXTROSE 20-5 MG/100ML-% IV SOLN
0.1250 ug/kg/min | INTRAVENOUS | Status: DC
  Filled 2017-09-05: qty 100

## 2017-09-05 MED ORDER — SODIUM CHLORIDE 0.9 % IV SOLN
30.0000 ug/min | INTRAVENOUS | Status: AC
  Administered 2017-09-06: 25 ug/min via INTRAVENOUS
  Filled 2017-09-05: qty 2

## 2017-09-05 MED ORDER — EPINEPHRINE PF 1 MG/ML IJ SOLN
0.0000 ug/min | INTRAVENOUS | Status: DC
  Filled 2017-09-05: qty 4

## 2017-09-05 MED ORDER — SODIUM CHLORIDE 0.9 % IV SOLN
INTRAVENOUS | Status: DC
  Filled 2017-09-05: qty 30

## 2017-09-05 MED ORDER — MAGNESIUM SULFATE 50 % IJ SOLN
40.0000 meq | INTRAMUSCULAR | Status: DC
  Filled 2017-09-05: qty 9.85

## 2017-09-05 MED ORDER — PLASMA-LYTE 148 IV SOLN
INTRAVENOUS | Status: DC
  Filled 2017-09-05: qty 2.5

## 2017-09-05 MED ORDER — DOPAMINE-DEXTROSE 3.2-5 MG/ML-% IV SOLN
0.0000 ug/kg/min | INTRAVENOUS | Status: DC
  Filled 2017-09-05: qty 250

## 2017-09-05 MED ORDER — TEMAZEPAM 15 MG PO CAPS
15.0000 mg | ORAL_CAPSULE | Freq: Once | ORAL | Status: AC | PRN
  Administered 2017-09-05: 15 mg via ORAL
  Filled 2017-09-05 (×2): qty 1

## 2017-09-05 MED ORDER — BISACODYL 5 MG PO TBEC
5.0000 mg | DELAYED_RELEASE_TABLET | Freq: Once | ORAL | Status: AC
  Administered 2017-09-05: 5 mg via ORAL
  Filled 2017-09-05: qty 1

## 2017-09-05 MED ORDER — CHLORHEXIDINE GLUCONATE 0.12 % MT SOLN
15.0000 mL | Freq: Once | OROMUCOSAL | Status: AC
  Administered 2017-09-06: 15 mL via OROMUCOSAL
  Filled 2017-09-05: qty 15

## 2017-09-05 MED ORDER — CHLORHEXIDINE GLUCONATE 4 % EX LIQD
60.0000 mL | Freq: Once | CUTANEOUS | Status: AC
  Administered 2017-09-06: 4 via TOPICAL
  Filled 2017-09-05: qty 60

## 2017-09-05 MED ORDER — CHLORHEXIDINE GLUCONATE 4 % EX LIQD
60.0000 mL | Freq: Once | CUTANEOUS | Status: AC
  Administered 2017-09-05: 4 via TOPICAL
  Filled 2017-09-05: qty 60

## 2017-09-05 MED ORDER — VANCOMYCIN HCL 10 G IV SOLR
1500.0000 mg | INTRAVENOUS | Status: AC
  Administered 2017-09-06: 1500 mg via INTRAVENOUS
  Filled 2017-09-05: qty 1500

## 2017-09-05 MED ORDER — DEXMEDETOMIDINE HCL IN NACL 400 MCG/100ML IV SOLN
0.1000 ug/kg/h | INTRAVENOUS | Status: AC
  Administered 2017-09-06: .3 ug/kg/h via INTRAVENOUS
  Filled 2017-09-05: qty 100

## 2017-09-05 MED ORDER — METOPROLOL TARTRATE 12.5 MG HALF TABLET
12.5000 mg | ORAL_TABLET | Freq: Once | ORAL | Status: AC
  Administered 2017-09-06: 12.5 mg via ORAL
  Filled 2017-09-05: qty 1

## 2017-09-05 NOTE — Progress Notes (Addendum)
      [address]       [place] [address]             [phone]     CARDIOTHORACIC SURGERY PROGRESS NOTE  2 Days Post-Op  S/P Procedure(s) (LRB): LEFT HEART CATH AND CORONARY ANGIOGRAPHY (N/A)  Subjective: No complaints  Objective: Vital signs in last 24 hours: Temp:  [97.9 F (36.6 C)-98.3 F (36.8 C)] 98.3 F (36.8 C) (07/24 1962) Pulse Rate:  [37-63] 53 (07/24 2297) Cardiac Rhythm: Normal sinus rhythm (07/24 0800) Resp:  [18] 18 (07/24 0608) BP: (159-171)/(59-82) 171/82 (07/24 0608) SpO2:  [95 %-96 %] 96 % (07/24 0608) Weight:  [201 lb 9.6 oz (91.4 kg)] 201 lb 9.6 oz (91.4 kg) (07/24 9892)  Physical Exam:  Rhythm:   sinus  Breath sounds: clear  Heart sounds:  RRR  Incisions:  n/a  Abdomen:  soft  Extremities:  warm   Intake/Output from previous day: 07/23 0701 - 07/24 0700 In: 1195.7 [P.O.:960; I.V.:235.7] Out: 200 [Urine:200] Intake/Output this shift: Total I/O In: 192.3 [P.O.:120; I.V.:72.3] Out: -   Lab Results: Recent Labs    09/04/17 0844 09/05/17 0352  WBC 6.4 5.4  HGB 13.3 12.9*  HCT 40.3 39.8  PLT 220 212   BMET:  Recent Labs    09/03/17 1115 09/04/17 1117  NA 141 140  K 4.8 4.4  CL 110 107  CO2 23 26  GLUCOSE 117* 140*  BUN 24* 22  CREATININE 1.71* 1.69*  CALCIUM 8.9 9.4    CBG (last 3)  Recent Labs    09/04/17 2144 09/05/17 0737 09/05/17 1150  GLUCAP 122* 138* 107*   PT/INR:   Recent Labs    09/04/17 1117  LABPROT 13.1  INR 1.00    CXR:  N/A  Assessment/Plan: S/P Procedure(s) (LRB): LEFT HEART CATH AND CORONARY ANGIOGRAPHY (N/A)  Clinically stable For CABG tomorrow D/C heparin at midnight tonight All questions answered  Rexene Alberts, MD 09/05/2017 1:58 PM

## 2017-09-05 NOTE — Progress Notes (Signed)
Progress Note  Patient Name: Taylor [name] Date of Encounter: 09/05/2017  Primary Cardiologist: [name], MD   Subjective   Overall doing well, no chest pain, no shortness of breath.  CABG Thursday.  Inpatient Medications    Scheduled Meds: . amLODipine  5 mg Oral Daily  . aspirin EC  81 mg Oral QPM  . atorvastatin  80 mg Oral q1800  . insulin aspart  0-15 Units Subcutaneous TID WC  . insulin aspart  0-5 Units Subcutaneous QHS  . metoprolol tartrate  25 mg Oral BID  . sodium chloride flush  3 mL Intravenous Q12H   Continuous Infusions: . sodium chloride    . heparin 1,150 Units/hr (09/05/17 0342)   PRN Meds: sodium chloride, ondansetron (ZOFRAN) IV, sodium chloride flush   Vital Signs    Vitals:   09/04/17 1506 09/04/17 2048 09/04/17 2232 09/05/17 0608  BP:  (!) 170/59 (!) 159/75 (!) 171/82  Pulse: (!) 37 63 62 (!) 53  Resp:  18  18  Temp: 97.9 F (36.6 C) 98 F (36.7 C)  98.3 F (36.8 C)  TempSrc: Oral Oral    SpO2: 96% 95%  96%  Weight:    201 lb 9.6 oz (91.4 kg)  Height:        Intake/Output Summary (Last 24 hours) at 09/05/2017 0909 Last data filed at 09/05/2017 0800 Gross per 24 hour  Intake 1365.16 ml  Output -  Net 1365.16 ml   Filed Weights   09/03/17 0750 09/04/17 0500 09/05/17 0608  Weight: 206 lb (93.4 kg) 204 lb 14.4 oz (92.9 kg) 201 lb 9.6 oz (91.4 kg)    Telemetry    Sinus bradycardia, occasional PVCs, occasional ventricular trigeminy- Personally Reviewed  ECG    No new- Personally Reviewed  Physical Exam   GEN: No acute distress.   Neck: No JVD Neuro:  Nonfocal  Psych: Normal affect   Labs    Chemistry Recent Labs  Lab 08/31/17 1558 09/03/17 1115 09/04/17 1117  NA 142 141 140  K 5.0 4.8 4.4  CL 104 110 107  CO2 23 23 26   GLUCOSE 110* 117* 140*  BUN 21 24* 22  CREATININE 1.91* 1.71* 1.69*  CALCIUM 9.4 8.9 9.4  PROT  --   --  6.9  ALBUMIN  --   --  3.8  AST  --   --  16  ALT  --   --  14  ALKPHOS  --    --  83  BILITOT  --   --  1.1  GFRNONAA 35* 40* 40*  GFRAA 41* 46* 26*  ANIONGAP  --  8 7     Hematology Recent Labs  Lab 08/31/17 1558 09/04/17 0844 09/05/17 0352  WBC 5.9 6.4 5.4  RBC 4.38 4.60 4.53  HGB 12.5* 13.3 12.9*  HCT 37.5 40.3 39.8  MCV 86 87.6 87.9  MCH 28.5 28.9 28.5  MCHC 33.3 33.0 32.4  RDW 13.2 13.2 13.2  PLT 227 220 212    Cardiac EnzymesNo results for input(s): TROPONINI in the last 168 hours. No results for input(s): TROPIPOC in the last 168 hours.   BNPNo results for input(s): BNP, PROBNP in the last 168 hours.   DDimer No results for input(s): DDIMER in the last 168 hours.   Radiology    Dg Chest 2 View  Result Date: 09/04/2017 CLINICAL DATA:  Preoperative examination prior to CABG. EXAM: CHEST - 2 VIEW COMPARISON:  Chest x-ray of November 20, 2006 FINDINGS: The lungs are adequately inflated. There is no focal infiltrate. There is no pleural effusion. The heart and pulmonary vascularity are normal. There is calcification in the wall of the aortic arch. The bony thorax exhibits no acute abnormality. IMPRESSION: There is no acute cardiopulmonary abnormality. Thoracic aortic atherosclerosis. Electronically Signed   By: David  Martinique M.D.   On: 09/04/2017 13:21    Cardiac Studies   Cardiac catheterization 09/03/2017-severe multivessel coronary artery disease with highly positive stress test. Diagnostic Diagram        - Left ventricle: The cavity size was moderately dilated. Systolic   function was mildly to moderately reduced. The estimated ejection   fraction was in the range of 40% to 45%. Diffuse hypokinesis. The   study is not technically sufficient to allow evaluation of LV   diastolic function. - Aortic valve: Transvalvular velocity was within the normal range.   There was no stenosis. There was no regurgitation. - Mitral valve: Transvalvular velocity was within the normal range.   There was no evidence for stenosis. There was trivial    regurgitation. - Left atrium: The atrium was severely dilated. - Right ventricle: The cavity size was normal. Wall thickness was   normal. Systolic function was normal. - Atrial septum: No defect or patent foramen ovale was identified   by color flow Doppler. - Tricuspid valve: There was mild regurgitation. - Pulmonary arteries: Systolic pressure was mildly increased. PA   peak pressure: 44 mm Hg (S).  Impressions:  - Frequent PVCs may lead to underestimation of left ventricular   systolic function.  Patient Profile     67 y.o. male severe coronary artery disease multivessel awaiting CABG on Thursday, Dr. Zenia Resides  Assessment & Plan    Severe coronary artery disease - CABG Thursday  Diabetes with hypertension and hyperlipidemia with strong family history of premature coronary artery disease -Secondary prevention.  High intensity statin dose.  Blood pressure currently mildly elevated however he is about to undergo bypass.  We will not start any new antihypertensives at this time given possibility of hypotension postoperatively.  PVCs -Ventricular trigeminy noted on telemetry.  Benign.  Has had brief nonsustained ventricular tachycardia in the past.  Continue metoprolol.  Mildly reduced ejection fraction -40 to 45%, may have been falsely reduced by frequent PVCs noted during echocardiogram study.  Mildly elevated creatinine -Improving.  Avoiding ACE inhibitor at this time.  For questions or updates, please contact [place] Please consult [email or web address] for contact info under Cardiology/STEMI.      Signed, Candee Furbish, MD  09/05/2017, 9:09 AM

## 2017-09-05 NOTE — Progress Notes (Signed)
ANTICOAGULATION CONSULT NOTE - Follow Up Consult  Pharmacy Consult for heparin Indication: chest pain/ACS for CABG  No Known Allergies  Patient Measurements: Height: 5\' 11"  (180.3 cm) Weight: 201 lb 9.6 oz (91.4 kg) IBW/kg (Calculated) : 75.3 Heparin Dosing Weight: 93kg  Vital Signs: Temp: 98.3 F (36.8 C) (07/24 0608) BP: 171/82 (07/24 0608) Pulse Rate: 53 (07/24 0608)  Labs: Recent Labs    09/03/17 1115 09/04/17 0844 09/04/17 1117 09/05/17 0352  HGB  --  13.3  --  12.9*  HCT  --  40.3  --  39.8  PLT  --  220  --  212  APTT  --   --  45*  --   LABPROT  --   --  13.1  --   INR  --   --  1.00  --   HEPARINUNFRC  --  0.30  --  0.42  CREATININE 1.71*  --  1.69*  --     Estimated Creatinine Clearance: 49 mL/min (A) (by C-G formula based on SCr of 1.69 mg/dL (H)).   Medical History: Past Medical History:  Diagnosis Date  . Bigeminal rhythm 08/01/2017   per EKG 07-24-2017 in epic (per confirmation new since last ecg in 2007)  in care everywhere in epic ecg dated 2012 shows sinus brady rate 53 no pvc's --per pt asymptomatic and   . Bradycardia   . Family history of premature coronary artery disease    Father - MI 65. Paternal uncle x 2 -1 with MI & other with CABG in 85s  . History of contusion    ED visit 08-04-2013 , 9 days past injury ,  dx concussion w/ LOC, per CT inferior frontal lobes contusion hemarrhage-- residual headache (08-01-2017  per pt residual resolved)  . History of kidney stones   . Hypercholesterolemia   . Hypertension   . Nephrolithiasis    bilateral nonobstructive per CT 07-24-2017  . Type 2 diabetes mellitus ([place])    followed by pcp  . Ureteral calculi    left  . Wears glasses   . Wears hearing aid in both ears      Assessment: 86 yoM s/p cath lab for positive stress test found to have severe 3vCAD. Pharmacy dosing IV heparin while awaiting CABG. HL this AM remains therapeutic at 0.42. H/H and plt stable.   Goal of Therapy:  Heparin  level 0.3-0.7 units/ml Monitor platelets by anticoagulation protocol: Yes   Plan:  -Continue heparin slightly at 1150 units/hr -Daily heparin level, CBC -For CABG on thursday  Albertina Parr, PharmD., BCPS Clinical Pharmacist Clinical phone for 09/04/17 until 3:30pm: [phone] If after 3:30pm, please refer to [facility] for unit-specific pharmacist

## 2017-09-05 NOTE — Consult Note (Signed)
[facility] CM Primary Care Navigator  09/05/2017  JATHNIEL SMELTZER 1950-11-10 [id number]   Met with patient, wife Lovey Newcomer) and family at the bedside to identify possible discharge needs. Patient reports that he was referred to cardiology by primary care provider for "low heart rate". Per MD note, he was brought back for an urgent visit to discuss the results of his stress test and the recommendation for cardiac catheterization which had led to this admission and eventually surgery.  Patient endorses Dr.Richard Tisovec with [place]  as hisprimary care provider.   [facility] [place] [facility].  Patientverbalized managing hisownmedications at [employer] out of the containers.   Patientreports that he has been driving prior tothis hospitalization. Hiswife willbe able toprovide transportation to hisdoctors'appointmentsafter discharge.  Patient lives with wife who will serve as his primary caregiver at home when discharge.   Disposition for discharge is still pending- for surgical intervention(CABG- coronary artery bypass graft in am).He plans to go home when ready.  Patient voiced understanding to call primary care provider's office whenhe returns home, for a post discharge follow-up appointment within a week or sooner if needed.Patient letter (with PCP's contact number) was provided as a reminder.  Explained to patient about [facility] CM services available for health management at home but patientindicated that he is capable and knowledgeable of managing his health issues at home with wife's assistance.  Patient voiced understanding to seekreferral to [facility] care managementfrom primary care providerif deemed necessary andappropriate foranyservices in thefuture.  [facility] care management information provided for future needs that he may  have.   Primary care provider's office is listed as providing transition of care (TOC) follow-up.    For additional questions please contact:  Edwena Felty A. Oral Hallgren, BSN, RN-BC [facility] PRIMARY CARE Navigator Cell: [phone]

## 2017-09-06 ENCOUNTER — Inpatient Hospital Stay (HOSPITAL_COMMUNITY): Payer: PPO

## 2017-09-06 ENCOUNTER — Inpatient Hospital Stay (HOSPITAL_COMMUNITY): Payer: Self-pay

## 2017-09-06 ENCOUNTER — Inpatient Hospital Stay (HOSPITAL_COMMUNITY): Payer: PPO | Admitting: Anesthesiology

## 2017-09-06 ENCOUNTER — Other Ambulatory Visit: Payer: Self-pay

## 2017-09-06 ENCOUNTER — Encounter (HOSPITAL_COMMUNITY): Payer: Self-pay | Admitting: Certified Registered"

## 2017-09-06 ENCOUNTER — Inpatient Hospital Stay (HOSPITAL_COMMUNITY)
Admission: RE | Disposition: A | Discharge status: Home / Self Care | Payer: Self-pay | Source: Ambulatory Visit | Attending: Thoracic Surgery (Cardiothoracic Vascular Surgery)

## 2017-09-06 ENCOUNTER — Inpatient Hospital Stay: Payer: Self-pay

## 2017-09-06 DIAGNOSIS — Z951 Presence of aortocoronary bypass graft: Secondary | ICD-10-CM

## 2017-09-06 DIAGNOSIS — I25110 Atherosclerotic heart disease of native coronary artery with unstable angina pectoris: Secondary | ICD-10-CM

## 2017-09-06 HISTORY — DX: Presence of aortocoronary bypass graft: Z95.1

## 2017-09-06 HISTORY — PX: CORONARY ARTERY BYPASS GRAFT: SHX141

## 2017-09-06 HISTORY — PX: TEE WITHOUT CARDIOVERSION: SHX5443

## 2017-09-06 LAB — POCT I-STAT, CHEM 8
BUN: 25 mg/dL — ABNORMAL HIGH (ref 8–23)
BUN: 22 mg/dL (ref 8–23)
BUN: 23 mg/dL (ref 8–23)
BUN: 22 mg/dL (ref 8–23)
BUN: 21 mg/dL (ref 8–23)
BUN: 22 mg/dL (ref 8–23)
Calcium, Ion: 1.21 mmol/L (ref 1.15–1.40)
Calcium, Ion: 0.99 mmol/L — ABNORMAL LOW (ref 1.15–1.40)
Calcium, Ion: 1.17 mmol/L (ref 1.15–1.40)
Calcium, Ion: 1 mmol/L — ABNORMAL LOW (ref 1.15–1.40)
Calcium, Ion: 1.02 mmol/L — ABNORMAL LOW (ref 1.15–1.40)
Calcium, Ion: 1.16 mmol/L (ref 1.15–1.40)
Chloride: 105 mmol/L (ref 98–111)
Chloride: 102 mmol/L (ref 98–111)
Chloride: 106 mmol/L (ref 98–111)
Chloride: 103 mmol/L (ref 98–111)
Chloride: 103 mmol/L (ref 98–111)
Chloride: 106 mmol/L (ref 98–111)
Creatinine, Ser: 1.6 mg/dL — ABNORMAL HIGH (ref 0.61–1.24)
Creatinine, Ser: 1.3 mg/dL — ABNORMAL HIGH (ref 0.61–1.24)
Creatinine, Ser: 1.5 mg/dL — ABNORMAL HIGH (ref 0.61–1.24)
Creatinine, Ser: 1.4 mg/dL — ABNORMAL HIGH (ref 0.61–1.24)
Creatinine, Ser: 1.4 mg/dL — ABNORMAL HIGH (ref 0.61–1.24)
Creatinine, Ser: 1.5 mg/dL — ABNORMAL HIGH (ref 0.61–1.24)
Glucose, Bld: 150 mg/dL — ABNORMAL HIGH (ref 70–99)
Glucose, Bld: 117 mg/dL — ABNORMAL HIGH (ref 70–99)
Glucose, Bld: 131 mg/dL — ABNORMAL HIGH (ref 70–99)
Glucose, Bld: 119 mg/dL — ABNORMAL HIGH (ref 70–99)
Glucose, Bld: 132 mg/dL — ABNORMAL HIGH (ref 70–99)
Glucose, Bld: 139 mg/dL — ABNORMAL HIGH (ref 70–99)
HCT: 35 % — ABNORMAL LOW (ref 39.0–52.0)
HCT: 26 % — ABNORMAL LOW (ref 39.0–52.0)
HCT: 31 % — ABNORMAL LOW (ref 39.0–52.0)
HCT: 26 % — ABNORMAL LOW (ref 39.0–52.0)
HCT: 26 % — ABNORMAL LOW (ref 39.0–52.0)
HCT: 30 % — ABNORMAL LOW (ref 39.0–52.0)
Hemoglobin: 11.9 g/dL — ABNORMAL LOW (ref 13.0–17.0)
Hemoglobin: 10.5 g/dL — ABNORMAL LOW (ref 13.0–17.0)
Hemoglobin: 8.8 g/dL — ABNORMAL LOW (ref 13.0–17.0)
Hemoglobin: 8.8 g/dL — ABNORMAL LOW (ref 13.0–17.0)
Hemoglobin: 8.8 g/dL — ABNORMAL LOW (ref 13.0–17.0)
Hemoglobin: 10.2 g/dL — ABNORMAL LOW (ref 13.0–17.0)
Potassium: 4.2 mmol/L (ref 3.5–5.1)
Potassium: 4.2 mmol/L (ref 3.5–5.1)
Potassium: 5.1 mmol/L (ref 3.5–5.1)
Potassium: 5.1 mmol/L (ref 3.5–5.1)
Potassium: 4.8 mmol/L (ref 3.5–5.1)
Potassium: 4.6 mmol/L (ref 3.5–5.1)
Sodium: 139 mmol/L (ref 135–145)
Sodium: 139 mmol/L (ref 135–145)
Sodium: 141 mmol/L (ref 135–145)
Sodium: 139 mmol/L (ref 135–145)
Sodium: 139 mmol/L (ref 135–145)
Sodium: 140 mmol/L (ref 135–145)
TCO2: 26 mmol/L (ref 22–32)
TCO2: 24 mmol/L (ref 22–32)
TCO2: 26 mmol/L (ref 22–32)
TCO2: 27 mmol/L (ref 22–32)
TCO2: 25 mmol/L (ref 22–32)
TCO2: 23 mmol/L (ref 22–32)

## 2017-09-06 LAB — POCT I-STAT 3, ART BLOOD GAS (G3+)
Acid-Base Excess: 2 mmol/L (ref 0.0–2.0)
Acid-base deficit: 2 mmol/L (ref 0.0–2.0)
Acid-base deficit: 5 mmol/L — ABNORMAL HIGH (ref 0.0–2.0)
Acid-base deficit: 3 mmol/L — ABNORMAL HIGH (ref 0.0–2.0)
Bicarbonate: 26.8 mmol/L (ref 20.0–28.0)
Bicarbonate: 23.4 mmol/L (ref 20.0–28.0)
Bicarbonate: 20.4 mmol/L (ref 20.0–28.0)
Bicarbonate: 23.2 mmol/L (ref 20.0–28.0)
O2 Saturation: 100 %
O2 Saturation: 97 %
O2 Saturation: 98 %
O2 Saturation: 99 %
Patient temperature: 36
Patient temperature: 37.5
Patient temperature: 36.6
TCO2: 28 mmol/L (ref 22–32)
TCO2: 25 mmol/L (ref 22–32)
TCO2: 22 mmol/L (ref 22–32)
TCO2: 25 mmol/L (ref 22–32)
pCO2 arterial: 42.5 mmHg (ref 32.0–48.0)
pCO2 arterial: 41.5 mmHg (ref 32.0–48.0)
pCO2 arterial: 37.2 mmHg (ref 32.0–48.0)
pCO2 arterial: 43 mmHg (ref 32.0–48.0)
pH, Arterial: 7.407 (ref 7.350–7.450)
pH, Arterial: 7.354 (ref 7.350–7.450)
pH, Arterial: 7.351 (ref 7.350–7.450)
pH, Arterial: 7.339 — ABNORMAL LOW (ref 7.350–7.450)
pO2, Arterial: 331 mmHg — ABNORMAL HIGH (ref 83.0–108.0)
pO2, Arterial: 95 mmHg (ref 83.0–108.0)
pO2, Arterial: 116 mmHg — ABNORMAL HIGH (ref 83.0–108.0)
pO2, Arterial: 136 mmHg — ABNORMAL HIGH (ref 83.0–108.0)

## 2017-09-06 LAB — GLUCOSE, CAPILLARY
Glucose-Capillary: 121 mg/dL — ABNORMAL HIGH (ref 70–99)
Glucose-Capillary: 104 mg/dL — ABNORMAL HIGH (ref 70–99)
Glucose-Capillary: 82 mg/dL (ref 70–99)
Glucose-Capillary: 135 mg/dL — ABNORMAL HIGH (ref 70–99)
Glucose-Capillary: 127 mg/dL — ABNORMAL HIGH (ref 70–99)
Glucose-Capillary: 129 mg/dL — ABNORMAL HIGH (ref 70–99)
Glucose-Capillary: 131 mg/dL — ABNORMAL HIGH (ref 70–99)
Glucose-Capillary: 167 mg/dL — ABNORMAL HIGH (ref 70–99)
Glucose-Capillary: 154 mg/dL — ABNORMAL HIGH (ref 70–99)

## 2017-09-06 LAB — POCT I-STAT 4, (NA,K, GLUC, HGB,HCT)
Glucose, Bld: 132 mg/dL — ABNORMAL HIGH (ref 70–99)
HCT: 26 % — ABNORMAL LOW (ref 39.0–52.0)
Hemoglobin: 8.8 g/dL — ABNORMAL LOW (ref 13.0–17.0)
Potassium: 4.2 mmol/L (ref 3.5–5.1)
Sodium: 141 mmol/L (ref 135–145)

## 2017-09-06 LAB — CBC
HCT: 37.1 % — ABNORMAL LOW (ref 39.0–52.0)
HCT: 30.5 % — ABNORMAL LOW (ref 39.0–52.0)
HCT: 31.7 % — ABNORMAL LOW (ref 39.0–52.0)
Hemoglobin: 12.1 g/dL — ABNORMAL LOW (ref 13.0–17.0)
Hemoglobin: 10.1 g/dL — ABNORMAL LOW (ref 13.0–17.0)
Hemoglobin: 10.3 g/dL — ABNORMAL LOW (ref 13.0–17.0)
MCH: 28.5 pg (ref 26.0–34.0)
MCH: 28.9 pg (ref 26.0–34.0)
MCH: 28.6 pg (ref 26.0–34.0)
MCHC: 32.6 g/dL (ref 30.0–36.0)
MCHC: 33.1 g/dL (ref 30.0–36.0)
MCHC: 32.5 g/dL (ref 30.0–36.0)
MCV: 87.5 fL (ref 78.0–100.0)
MCV: 87.4 fL (ref 78.0–100.0)
MCV: 88.1 fL (ref 78.0–100.0)
Platelets: 197 10*3/uL (ref 150–400)
Platelets: 140 10*3/uL — ABNORMAL LOW (ref 150–400)
Platelets: 163 10*3/uL (ref 150–400)
RBC: 4.24 MIL/uL (ref 4.22–5.81)
RBC: 3.49 MIL/uL — ABNORMAL LOW (ref 4.22–5.81)
RBC: 3.6 MIL/uL — ABNORMAL LOW (ref 4.22–5.81)
RDW: 13.3 % (ref 11.5–15.5)
RDW: 13.1 % (ref 11.5–15.5)
RDW: 13.2 % (ref 11.5–15.5)
WBC: 5.6 10*3/uL (ref 4.0–10.5)
WBC: 7.3 10*3/uL (ref 4.0–10.5)
WBC: 9.1 10*3/uL (ref 4.0–10.5)

## 2017-09-06 LAB — PROTIME-INR
INR: 1.26
Prothrombin Time: 15.7 seconds — ABNORMAL HIGH (ref 11.4–15.2)

## 2017-09-06 LAB — CREATININE, SERUM
Creatinine, Ser: 1.6 mg/dL — ABNORMAL HIGH (ref 0.61–1.24)
GFR calc Af Amer: 50 mL/min — ABNORMAL LOW (ref >60)
GFR calc non Af Amer: 43 mL/min — ABNORMAL LOW (ref >60)

## 2017-09-06 LAB — PLATELET COUNT: Platelets: 129 10*3/uL — ABNORMAL LOW (ref 150–400)

## 2017-09-06 LAB — APTT: aPTT: 30 seconds (ref 24–36)

## 2017-09-06 LAB — MAGNESIUM: Magnesium: 2.9 mg/dL — ABNORMAL HIGH (ref 1.7–2.4)

## 2017-09-06 LAB — HEMOGLOBIN AND HEMATOCRIT, BLOOD
HCT: 26.4 % — ABNORMAL LOW (ref 39.0–52.0)
Hemoglobin: 8.9 g/dL — ABNORMAL LOW (ref 13.0–17.0)

## 2017-09-06 SURGERY — CORONARY ARTERY BYPASS GRAFTING (CABG)
Anesthesia: General | Site: Chest

## 2017-09-06 MED ORDER — PHENYLEPHRINE HCL-NACL 20-0.9 MG/250ML-% IV SOLN
0.0000 ug/min | INTRAVENOUS | Status: DC
  Filled 2017-09-06: qty 250

## 2017-09-06 MED ORDER — MORPHINE SULFATE (PF) 2 MG/ML IV SOLN: 1.0000 mg | INTRAVENOUS | Status: DC | PRN

## 2017-09-06 MED ORDER — BISACODYL 10 MG RE SUPP: 10.0000 mg | Freq: Every day | RECTAL | Status: DC

## 2017-09-06 MED ORDER — CHLORHEXIDINE GLUCONATE 0.12 % MT SOLN
15.0000 mL | OROMUCOSAL | Status: AC
  Administered 2017-09-06: 15 mL via OROMUCOSAL

## 2017-09-06 MED ORDER — POTASSIUM CHLORIDE 10 MEQ/50ML IV SOLN: 10.0000 meq | INTRAVENOUS | Status: AC

## 2017-09-06 MED ORDER — LACTATED RINGERS IV SOLN: INTRAVENOUS | Status: DC

## 2017-09-06 MED ORDER — FENTANYL CITRATE (PF) 250 MCG/5ML IJ SOLN
INTRAMUSCULAR | Status: AC
  Filled 2017-09-06: qty 25

## 2017-09-06 MED ORDER — ROCURONIUM BROMIDE 10 MG/ML (PF) SYRINGE
PREFILLED_SYRINGE | INTRAVENOUS | Status: DC | PRN
  Administered 2017-09-06 (×3): 50 mg via INTRAVENOUS

## 2017-09-06 MED ORDER — HEMOSTATIC AGENTS (NO CHARGE) OPTIME
TOPICAL | Status: DC | PRN
  Administered 2017-09-06 (×2): 1 via TOPICAL

## 2017-09-06 MED ORDER — CHLORHEXIDINE GLUCONATE 0.12% ORAL RINSE (MEDLINE KIT): 15.0000 mL | Freq: Two times a day (BID) | OROMUCOSAL | Status: DC

## 2017-09-06 MED ORDER — SODIUM CHLORIDE 0.9 % IJ SOLN
INTRAMUSCULAR | Status: AC
  Filled 2017-09-06: qty 10

## 2017-09-06 MED ORDER — PROPOFOL 10 MG/ML IV BOLUS
INTRAVENOUS | Status: DC | PRN
  Administered 2017-09-06: 70 mg via INTRAVENOUS

## 2017-09-06 MED ORDER — FAMOTIDINE IN NACL 20-0.9 MG/50ML-% IV SOLN
20.0000 mg | Freq: Two times a day (BID) | INTRAVENOUS | Status: DC
  Administered 2017-09-06: 20 mg via INTRAVENOUS

## 2017-09-06 MED ORDER — TRAMADOL HCL 50 MG PO TABS
50.0000 mg | ORAL_TABLET | ORAL | Status: DC | PRN
  Administered 2017-09-08: 100 mg via ORAL
  Filled 2017-09-06: qty 2

## 2017-09-06 MED ORDER — LACTATED RINGERS IV SOLN
INTRAVENOUS | Status: DC | PRN
  Administered 2017-09-06 (×2): via INTRAVENOUS

## 2017-09-06 MED ORDER — MIDAZOLAM HCL 10 MG/2ML IJ SOLN
INTRAMUSCULAR | Status: AC
  Filled 2017-09-06: qty 2

## 2017-09-06 MED ORDER — METOPROLOL TARTRATE 5 MG/5ML IV SOLN: 2.5000 mg | INTRAVENOUS | Status: DC | PRN

## 2017-09-06 MED ORDER — LACTATED RINGERS IV SOLN
INTRAVENOUS | Status: DC | PRN
  Administered 2017-09-06: 07:00:00 via INTRAVENOUS

## 2017-09-06 MED ORDER — INSULIN REGULAR BOLUS VIA INFUSION
0.0000 [IU] | Freq: Three times a day (TID) | INTRAVENOUS | Status: DC
  Filled 2017-09-06: qty 10

## 2017-09-06 MED ORDER — ACETAMINOPHEN 500 MG PO TABS
1000.0000 mg | ORAL_TABLET | Freq: Four times a day (QID) | ORAL | Status: DC
  Administered 2017-09-06 – 2017-09-10 (×14): 1000 mg via ORAL
  Filled 2017-09-06 (×17): qty 2

## 2017-09-06 MED ORDER — MAGNESIUM SULFATE 4 GM/100ML IV SOLN
4.0000 g | Freq: Once | INTRAVENOUS | Status: AC
  Administered 2017-09-06: 4 g via INTRAVENOUS
  Filled 2017-09-06: qty 100

## 2017-09-06 MED ORDER — ARTIFICIAL TEARS OPHTHALMIC OINT
TOPICAL_OINTMENT | OPHTHALMIC | Status: DC | PRN
  Administered 2017-09-06: 1 via OPHTHALMIC

## 2017-09-06 MED ORDER — ORAL CARE MOUTH RINSE
15.0000 mL | OROMUCOSAL | Status: DC
  Administered 2017-09-06 (×2): 15 mL via OROMUCOSAL

## 2017-09-06 MED ORDER — SODIUM CHLORIDE 0.9% FLUSH: 3.0000 mL | INTRAVENOUS | Status: DC | PRN

## 2017-09-06 MED ORDER — HEPARIN SODIUM (PORCINE) 1000 UNIT/ML IJ SOLN
INTRAMUSCULAR | Status: DC | PRN
  Administered 2017-09-06: 3000 [IU] via INTRAVENOUS
  Administered 2017-09-06: 25000 [IU] via INTRAVENOUS

## 2017-09-06 MED ORDER — SODIUM CHLORIDE 0.9% FLUSH: 3.0000 mL | Freq: Two times a day (BID) | INTRAVENOUS | Status: DC

## 2017-09-06 MED ORDER — SODIUM CHLORIDE 0.9 % IV SOLN
1.5000 g | Freq: Two times a day (BID) | INTRAVENOUS | Status: AC
  Administered 2017-09-06 – 2017-09-08 (×4): 1.5 g via INTRAVENOUS
  Filled 2017-09-06 (×4): qty 1.5

## 2017-09-06 MED ORDER — 0.9 % SODIUM CHLORIDE (POUR BTL) OPTIME
TOPICAL | Status: DC | PRN
  Administered 2017-09-06: 5000 mL

## 2017-09-06 MED ORDER — MIDAZOLAM HCL 5 MG/5ML IJ SOLN
INTRAMUSCULAR | Status: DC | PRN
  Administered 2017-09-06 (×3): 2 mg via INTRAVENOUS
  Administered 2017-09-06: 4 mg via INTRAVENOUS

## 2017-09-06 MED ORDER — ACETAMINOPHEN 160 MG/5ML PO SOLN: 1000.0000 mg | Freq: Four times a day (QID) | ORAL | Status: DC

## 2017-09-06 MED ORDER — PANTOPRAZOLE SODIUM 40 MG PO TBEC
40.0000 mg | DELAYED_RELEASE_TABLET | Freq: Every day | ORAL | Status: DC
  Administered 2017-09-08 – 2017-09-11 (×4): 40 mg via ORAL
  Filled 2017-09-06 (×4): qty 1

## 2017-09-06 MED ORDER — LACTATED RINGERS IV SOLN
500.0000 mL | Freq: Once | INTRAVENOUS | Status: AC | PRN
  Administered 2017-09-06: 500 mL via INTRAVENOUS

## 2017-09-06 MED ORDER — PLASMA-LYTE 148 IV SOLN
INTRAVENOUS | Status: DC | PRN
  Administered 2017-09-06: 09:00:00 via INTRAVASCULAR

## 2017-09-06 MED ORDER — MIDAZOLAM HCL 2 MG/2ML IJ SOLN: 2.0000 mg | INTRAMUSCULAR | Status: DC | PRN

## 2017-09-06 MED ORDER — VANCOMYCIN HCL IN DEXTROSE 1-5 GM/200ML-% IV SOLN
1000.0000 mg | Freq: Once | INTRAVENOUS | Status: AC
  Administered 2017-09-06: 1000 mg via INTRAVENOUS
  Filled 2017-09-06: qty 200

## 2017-09-06 MED ORDER — ROCURONIUM BROMIDE 10 MG/ML (PF) SYRINGE
PREFILLED_SYRINGE | INTRAVENOUS | Status: AC
  Filled 2017-09-06: qty 20

## 2017-09-06 MED ORDER — ALBUMIN HUMAN 5 % IV SOLN
INTRAVENOUS | Status: DC | PRN
  Administered 2017-09-06: 12:00:00 via INTRAVENOUS

## 2017-09-06 MED ORDER — ALBUMIN HUMAN 5 % IV SOLN
250.0000 mL | INTRAVENOUS | Status: AC | PRN
  Administered 2017-09-06 (×3): 250 mL via INTRAVENOUS
  Filled 2017-09-06: qty 250

## 2017-09-06 MED ORDER — SODIUM CHLORIDE 0.9% FLUSH: 10.0000 mL | INTRAVENOUS | Status: DC | PRN

## 2017-09-06 MED ORDER — METOPROLOL TARTRATE 25 MG/10 ML ORAL SUSPENSION: 12.5000 mg | Freq: Two times a day (BID) | ORAL | Status: DC

## 2017-09-06 MED ORDER — ACETAMINOPHEN 160 MG/5ML PO SOLN: 650.0000 mg | Freq: Once | ORAL | Status: AC

## 2017-09-06 MED ORDER — SODIUM CHLORIDE 0.9 % IV SOLN
INTRAVENOUS | Status: DC
  Administered 2017-09-06 (×2): via INTRAVENOUS

## 2017-09-06 MED ORDER — SODIUM CHLORIDE 0.45 % IV SOLN
INTRAVENOUS | Status: DC | PRN
  Administered 2017-09-06: 13:00:00 via INTRAVENOUS

## 2017-09-06 MED ORDER — OXYCODONE HCL 5 MG PO TABS: 5.0000 mg | ORAL_TABLET | ORAL | Status: DC | PRN

## 2017-09-06 MED ORDER — ASPIRIN EC 325 MG PO TBEC
325.0000 mg | DELAYED_RELEASE_TABLET | Freq: Every day | ORAL | Status: DC
  Administered 2017-09-07 – 2017-09-11 (×5): 325 mg via ORAL
  Filled 2017-09-06 (×5): qty 1

## 2017-09-06 MED ORDER — BISACODYL 5 MG PO TBEC
10.0000 mg | DELAYED_RELEASE_TABLET | Freq: Every day | ORAL | Status: DC
  Administered 2017-09-07 – 2017-09-09 (×3): 10 mg via ORAL
  Filled 2017-09-06 (×4): qty 2

## 2017-09-06 MED ORDER — SODIUM CHLORIDE 0.9 % IV SOLN
INTRAVENOUS | Status: DC
  Filled 2017-09-06: qty 1

## 2017-09-06 MED ORDER — PROTAMINE SULFATE 10 MG/ML IV SOLN
INTRAVENOUS | Status: DC | PRN
  Administered 2017-09-06: 280 mg via INTRAVENOUS

## 2017-09-06 MED ORDER — SODIUM CHLORIDE 0.9% FLUSH
10.0000 mL | Freq: Two times a day (BID) | INTRAVENOUS | Status: DC
  Administered 2017-09-06 – 2017-09-08 (×6): 10 mL

## 2017-09-06 MED ORDER — SODIUM CHLORIDE 0.9 % IV SOLN
INTRAVENOUS | Status: AC
  Administered 2017-09-06: 13:00:00 via INTRAVENOUS

## 2017-09-06 MED ORDER — ASPIRIN 81 MG PO CHEW: 324.0000 mg | CHEWABLE_TABLET | Freq: Every day | ORAL | Status: DC

## 2017-09-06 MED ORDER — SODIUM CHLORIDE 0.9 % IV SOLN: 250.0000 mL | INTRAVENOUS | Status: DC

## 2017-09-06 MED ORDER — ACETAMINOPHEN 650 MG RE SUPP
650.0000 mg | Freq: Once | RECTAL | Status: AC
  Administered 2017-09-06: 650 mg via RECTAL

## 2017-09-06 MED ORDER — SODIUM CHLORIDE 0.9 % IV SOLN
INTRAVENOUS | Status: DC | PRN
  Administered 2017-09-06: 25 ug/min via INTRAVENOUS

## 2017-09-06 MED ORDER — ORAL CARE MOUTH RINSE
15.0000 mL | Freq: Two times a day (BID) | OROMUCOSAL | Status: DC
  Administered 2017-09-06 – 2017-09-10 (×7): 15 mL via OROMUCOSAL

## 2017-09-06 MED ORDER — ONDANSETRON HCL 4 MG/2ML IJ SOLN: 4.0000 mg | Freq: Four times a day (QID) | INTRAMUSCULAR | Status: DC | PRN

## 2017-09-06 MED ORDER — DOCUSATE SODIUM 100 MG PO CAPS
200.0000 mg | ORAL_CAPSULE | Freq: Every day | ORAL | Status: DC
  Administered 2017-09-07 – 2017-09-09 (×3): 200 mg via ORAL
  Filled 2017-09-06 (×4): qty 2

## 2017-09-06 MED ORDER — PROPOFOL 10 MG/ML IV BOLUS
INTRAVENOUS | Status: AC
  Filled 2017-09-06: qty 20

## 2017-09-06 MED ORDER — PHENYLEPHRINE 40 MCG/ML (10ML) SYRINGE FOR IV PUSH (FOR BLOOD PRESSURE SUPPORT)
PREFILLED_SYRINGE | INTRAVENOUS | Status: DC | PRN
  Administered 2017-09-06: 40 ug via INTRAVENOUS

## 2017-09-06 MED ORDER — PROTAMINE SULFATE 10 MG/ML IV SOLN
INTRAVENOUS | Status: AC
  Filled 2017-09-06: qty 50

## 2017-09-06 MED ORDER — DEXMEDETOMIDINE HCL IN NACL 400 MCG/100ML IV SOLN
0.0000 ug/kg/h | INTRAVENOUS | Status: DC
  Administered 2017-09-06: 0.1 ug/kg/h via INTRAVENOUS
  Filled 2017-09-06: qty 100

## 2017-09-06 MED ORDER — METOPROLOL TARTRATE 12.5 MG HALF TABLET
12.5000 mg | ORAL_TABLET | Freq: Two times a day (BID) | ORAL | Status: DC
  Administered 2017-09-07 – 2017-09-09 (×5): 12.5 mg via ORAL
  Filled 2017-09-06 (×5): qty 1

## 2017-09-06 MED ORDER — NITROGLYCERIN IN D5W 200-5 MCG/ML-% IV SOLN: 0.0000 ug/min | INTRAVENOUS | Status: DC

## 2017-09-06 MED ORDER — FENTANYL CITRATE (PF) 250 MCG/5ML IJ SOLN
INTRAMUSCULAR | Status: DC | PRN
  Administered 2017-09-06 (×2): 100 ug via INTRAVENOUS
  Administered 2017-09-06 (×2): 150 ug via INTRAVENOUS
  Administered 2017-09-06: 50 ug via INTRAVENOUS
  Administered 2017-09-06: 150 ug via INTRAVENOUS
  Administered 2017-09-06: 100 ug via INTRAVENOUS
  Administered 2017-09-06: 50 ug via INTRAVENOUS

## 2017-09-06 MED ORDER — CHLORHEXIDINE GLUCONATE CLOTH 2 % EX PADS
6.0000 | MEDICATED_PAD | Freq: Every day | CUTANEOUS | Status: DC
  Administered 2017-09-06 – 2017-09-09 (×4): 6 via TOPICAL

## 2017-09-06 SURGICAL SUPPLY — 101 items
APPLIER CLIP 9.375 SM OPEN (CLIP) ×3
BAG DECANTER FOR FLEXI CONT (MISCELLANEOUS) ×6 IMPLANT
BANDAGE ACE 4X5 VEL STRL LF (GAUZE/BANDAGES/DRESSINGS) ×3 IMPLANT
BANDAGE ACE 6X5 VEL STRL LF (GAUZE/BANDAGES/DRESSINGS) ×3 IMPLANT
BASKET HEART (ORDER IN 25'S) (MISCELLANEOUS) ×1
BASKET HEART (ORDER IN 25S) (MISCELLANEOUS) ×2 IMPLANT
BLADE CLIPPER SURG (BLADE) IMPLANT
BLADE STERNUM SYSTEM 6 (BLADE) ×3 IMPLANT
BLADE SURG 11 STRL SS (BLADE) ×3 IMPLANT
BNDG GAUZE ELAST 4 BULKY (GAUZE/BANDAGES/DRESSINGS) ×3 IMPLANT
CANISTER SUCT 3000ML PPV (MISCELLANEOUS) ×3 IMPLANT
CANNULA EZ GLIDE AORTIC 21FR (CANNULA) ×6 IMPLANT
CATH CPB KIT OWEN (MISCELLANEOUS) ×3 IMPLANT
CATH THORACIC 36FR (CATHETERS) ×3 IMPLANT
CLIP APPLIE 9.375 SM OPEN (CLIP) ×2 IMPLANT
CLIP FOGARTY SPRING 6M (CLIP) ×3 IMPLANT
CLIP VESOCCLUDE MED 24/CT (CLIP) IMPLANT
CLIP VESOCCLUDE SM WIDE 24/CT (CLIP) IMPLANT
CONN ST 1/4X3/8  BEN (MISCELLANEOUS) ×1
CONN ST 1/4X3/8 BEN (MISCELLANEOUS) ×2 IMPLANT
CRADLE DONUT ADULT HEAD (MISCELLANEOUS) ×3 IMPLANT
DRAIN CHANNEL 32F RND 10.7 FF (WOUND CARE) ×6 IMPLANT
DRAPE CARDIOVASCULAR INCISE (DRAPES) ×1
DRAPE INCISE IOBAN 66X45 STRL (DRAPES) ×3 IMPLANT
DRAPE SLUSH/WARMER DISC (DRAPES) ×3 IMPLANT
DRAPE SRG 135X102X78XABS (DRAPES) ×2 IMPLANT
DRSG AQUACEL AG ADV 3.5X14 (GAUZE/BANDAGES/DRESSINGS) ×3 IMPLANT
DRSG COVADERM 4X14 (GAUZE/BANDAGES/DRESSINGS) ×3 IMPLANT
ELECT BLADE 4.0 EZ CLEAN MEGAD (MISCELLANEOUS) ×3
ELECT REM PT RETURN 9FT ADLT (ELECTROSURGICAL) ×6
ELECTRODE BLDE 4.0 EZ CLN MEGD (MISCELLANEOUS) ×2 IMPLANT
ELECTRODE REM PT RTRN 9FT ADLT (ELECTROSURGICAL) ×4 IMPLANT
FELT TEFLON 1X6 (MISCELLANEOUS) ×6 IMPLANT
GAUZE SPONGE 4X4 12PLY STRL (GAUZE/BANDAGES/DRESSINGS) ×3 IMPLANT
GAUZE SPONGE 4X4 12PLY STRL LF (GAUZE/BANDAGES/DRESSINGS) ×3 IMPLANT
GLOVE BIO SURGEON STRL SZ 6.5 (GLOVE) ×15 IMPLANT
GLOVE ORTHO TXT STRL SZ7.5 (GLOVE) ×6 IMPLANT
GLOVE SURG SS PI 6.0 STRL IVOR (GLOVE) ×3 IMPLANT
GOWN STRL REUS W/ TWL LRG LVL3 (GOWN DISPOSABLE) ×14 IMPLANT
GOWN STRL REUS W/TWL LRG LVL3 (GOWN DISPOSABLE) ×14
HEMOSTAT POWDER SURGIFOAM 1G (HEMOSTASIS) ×9 IMPLANT
INSERT FOGARTY XLG (MISCELLANEOUS) ×3 IMPLANT
KIT BASIN OR (CUSTOM PROCEDURE TRAY) ×3 IMPLANT
KIT SUCTION CATH 14FR (SUCTIONS) ×9 IMPLANT
KIT TURNOVER KIT B (KITS) ×3 IMPLANT
KIT VASOVIEW HEMOPRO VH 3000 (KITS) ×3 IMPLANT
LEAD PACING MYOCARDI (MISCELLANEOUS) ×3 IMPLANT
MARKER GRAFT CORONARY BYPASS (MISCELLANEOUS) ×9 IMPLANT
NS IRRIG 1000ML POUR BTL (IV SOLUTION) ×18 IMPLANT
PACK E OPEN HEART (SUTURE) ×3 IMPLANT
PACK OPEN HEART (CUSTOM PROCEDURE TRAY) ×3 IMPLANT
PAD ARMBOARD 7.5X6 YLW CONV (MISCELLANEOUS) ×6 IMPLANT
PAD ELECT DEFIB RADIOL ZOLL (MISCELLANEOUS) ×3 IMPLANT
PENCIL BUTTON HOLSTER BLD 10FT (ELECTRODE) ×3 IMPLANT
PUNCH AORTIC ROTATE 4.0MM (MISCELLANEOUS) IMPLANT
PUNCH AORTIC ROTATE 4.5MM 8IN (MISCELLANEOUS) ×3 IMPLANT
PUNCH AORTIC ROTATE 5MM 8IN (MISCELLANEOUS) IMPLANT
SET CARDIOPLEGIA MPS 5001102 (MISCELLANEOUS) ×3 IMPLANT
SOLUTION ANTI FOG 6CC (MISCELLANEOUS) ×3 IMPLANT
SPONGE LAP 18X18 X RAY DECT (DISPOSABLE) IMPLANT
SPONGE LAP 4X18 RFD (DISPOSABLE) ×3 IMPLANT
SUT BONE WAX W31G (SUTURE) ×3 IMPLANT
SUT ETHIBOND X763 2 0 SH 1 (SUTURE) ×6 IMPLANT
SUT MNCRL AB 3-0 PS2 18 (SUTURE) ×6 IMPLANT
SUT MNCRL AB 4-0 PS2 18 (SUTURE) ×3 IMPLANT
SUT PDS AB 1 CTX 36 (SUTURE) ×6 IMPLANT
SUT PROLENE 2 0 SH DA (SUTURE) IMPLANT
SUT PROLENE 3 0 SH DA (SUTURE) ×3 IMPLANT
SUT PROLENE 3 0 SH1 36 (SUTURE) IMPLANT
SUT PROLENE 4 0 RB 1 (SUTURE)
SUT PROLENE 4 0 SH DA (SUTURE) ×3 IMPLANT
SUT PROLENE 4-0 RB1 .5 CRCL 36 (SUTURE) IMPLANT
SUT PROLENE 5 0 C 1 36 (SUTURE) IMPLANT
SUT PROLENE 6 0 C 1 30 (SUTURE) IMPLANT
SUT PROLENE 7.0 RB 3 (SUTURE) ×12 IMPLANT
SUT PROLENE 8 0 BV175 6 (SUTURE) ×3 IMPLANT
SUT PROLENE BLUE 7 0 (SUTURE) ×6 IMPLANT
SUT PROLENE POLY MONO (SUTURE) IMPLANT
SUT SILK  1 MH (SUTURE) ×1
SUT SILK 1 MH (SUTURE) ×2 IMPLANT
SUT STEEL 6MS V (SUTURE) IMPLANT
SUT STEEL STERNAL CCS#1 18IN (SUTURE) IMPLANT
SUT STEEL SZ 6 DBL 3X14 BALL (SUTURE) IMPLANT
SUT VIC AB 1 CTX 36 (SUTURE)
SUT VIC AB 1 CTX36XBRD ANBCTR (SUTURE) IMPLANT
SUT VIC AB 2-0 CT1 27 (SUTURE) ×2
SUT VIC AB 2-0 CT1 TAPERPNT 27 (SUTURE) ×2 IMPLANT
SUT VIC AB 2-0 CTX 27 (SUTURE) ×3 IMPLANT
SUT VIC AB 3-0 SH 27 (SUTURE)
SUT VIC AB 3-0 SH 27X BRD (SUTURE) IMPLANT
SUT VIC AB 3-0 X1 27 (SUTURE) IMPLANT
SUT VICRYL 4-0 PS2 18IN ABS (SUTURE) IMPLANT
SYSTEM SAHARA CHEST DRAIN ATS (WOUND CARE) ×3 IMPLANT
TAPE CLOTH SURG 4X10 WHT LF (GAUZE/BANDAGES/DRESSINGS) ×3 IMPLANT
TAPE PAPER 2X10 WHT MICROPORE (GAUZE/BANDAGES/DRESSINGS) ×3 IMPLANT
TOWEL GREEN STERILE (TOWEL DISPOSABLE) ×3 IMPLANT
TOWEL GREEN STERILE FF (TOWEL DISPOSABLE) ×3 IMPLANT
TRAY FOLEY SLVR 16FR TEMP STAT (SET/KITS/TRAYS/PACK) ×3 IMPLANT
TUBING INSUFFLATION (TUBING) ×3 IMPLANT
UNDERPAD 30X30 (UNDERPADS AND DIAPERS) ×3 IMPLANT
WATER STERILE IRR 1000ML POUR (IV SOLUTION) ×6 IMPLANT

## 2017-09-06 NOTE — Anesthesia Procedure Notes (Signed)
Procedure Name: Intubation Date/Time: 09/06/2017 8:01 AM Performed by: Imagene Riches, CRNA Pre-anesthesia Checklist: Patient identified, Emergency Drugs available, Suction available and Patient being monitored Patient Re-evaluated:Patient Re-evaluated prior to induction Oxygen Delivery Method: Circle System Utilized Preoxygenation: Pre-oxygenation with 100% oxygen Induction Type: IV induction Ventilation: Mask ventilation without difficulty and Oral airway inserted - appropriate to patient size Laryngoscope Size: Miller and 3 Grade View: Grade II Tube type: Oral Tube size: 8.0 mm Number of attempts: 1 Airway Equipment and Method: Stylet and Oral airway Placement Confirmation: ETT inserted through vocal cords under direct vision,  positive ETCO2 and breath sounds checked- equal and bilateral Secured at: 22 cm Tube secured with: Tape Dental Injury: Teeth and Oropharynx as per pre-operative assessment  Comments: Anterior airway + full dentition. View of posterior arytenoids.

## 2017-09-06 NOTE — Plan of Care (Signed)
  Problem: Education: Goal: Knowledge of General Education information will improve Description Including pain rating scale, medication(s)/side effects and non-pharmacologic comfort measures Outcome: Progressing   Problem: Clinical Measurements: Goal: Respiratory complications will improve Outcome: Progressing   Problem: Activity: Goal: Risk for activity intolerance will decrease Outcome: Progressing   Problem: Coping: Goal: Level of anxiety will decrease Outcome: Progressing   Problem: Elimination: Goal: Will not experience complications related to urinary retention Outcome: Progressing   Problem: Pain Managment: Goal: General experience of comfort will improve Outcome: Progressing   Problem: Cardiac: Goal: Will achieve and/or maintain hemodynamic stability Outcome: Progressing   Problem: Respiratory: Goal: Respiratory status will improve Outcome: Progressing

## 2017-09-06 NOTE — Anesthesia Procedure Notes (Signed)
Central Venous Catheter Insertion Performed by: Roberts Gaudy, MD, anesthesiologist Start/End7/25/2019 7:05 AM, 09/06/2017 7:15 AM Patient location: Pre-op. Preanesthetic checklist: patient identified, IV checked, site marked, risks and benefits discussed, surgical consent, monitors and equipment checked, pre-op evaluation, timeout performed and anesthesia consent Hand hygiene performed  and maximum sterile barriers used  PA cath was placed.Swan type:thermodilution Procedure performed without using ultrasound guided technique. Attempts: 1 Following insertion, line sutured, dressing applied and Biopatch. Post procedure assessment: blood return through all ports and free fluid flow  Patient tolerated the procedure well with no immediate complications.

## 2017-09-06 NOTE — Progress Notes (Signed)
  Echocardiogram Echocardiogram Transesophageal has been performed.  Taylor [name] 09/06/2017, 9:09 AM

## 2017-09-06 NOTE — Procedures (Signed)
Extubation Procedure Note  Patient Details:   Name: Taylor [name] DOB: August 12, 1950 MRN: [id number]   Airway Documentation: Parameters NIF -30 cmh20, VC 1.7 L/min, audible cuff leak extubated to 4 lpm [name] pt had good productive cough stated his name and where he was.  IS 600-750 cc x 10.  Will continue to monitor pt.   Vent end date: 09/06/17 Vent end time: 1748   Evaluation  O2 sats: stable throughout Complications: No apparent complications Patient did tolerate procedure well. Bilateral Breath Sounds: Clear   Yes  Ned Grace 09/06/2017, 5:55 PM

## 2017-09-06 NOTE — Anesthesia Procedure Notes (Signed)
Central Venous Catheter Insertion Performed by: Roberts Gaudy, MD, anesthesiologist Start/End7/25/2019 7:05 AM, 09/06/2017 7:15 AM Patient location: Pre-op. Preanesthetic checklist: patient identified, IV checked, site marked, risks and benefits discussed, surgical consent, monitors and equipment checked, pre-op evaluation, timeout performed and anesthesia consent Lidocaine 1% used for infiltration and patient sedated Hand hygiene performed  and maximum sterile barriers used  Catheter size: 9 Fr Sheath introducer Procedure performed using ultrasound guided technique. Ultrasound Notes:anatomy identified, needle tip was noted to be adjacent to the nerve/plexus identified, no ultrasound evidence of intravascular and/or intraneural injection and image(s) printed for medical record Attempts: 1 Following insertion, line sutured and dressing applied. Post procedure assessment: blood return through all ports, free fluid flow and no air  Patient tolerated the procedure well with no immediate complications.

## 2017-09-06 NOTE — OR Nursing (Signed)
11:35 - 45 minute call to SICU charge nurse

## 2017-09-06 NOTE — Transfer of Care (Signed)
Immediate Anesthesia Transfer of Care Note  Patient: ARBER WIEMERS  Procedure(s) Performed: CORONARY ARTERY BYPASS GRAFTING (CABG) x four, using left internal mammary artery and right leg greater saphenous vein harvested endoscopically (N/A Chest) TRANSESOPHAGEAL ECHOCARDIOGRAM (TEE) (N/A )  Patient Location: PACU  Anesthesia Type:General  Level of Consciousness: sedated and Patient remains intubated per anesthesia plan  Airway & Oxygen Therapy: Patient remains intubated per anesthesia plan and Patient placed on Ventilator (see vital sign flow sheet for setting)  Post-op Assessment: Report given to RN and Post -op Vital signs reviewed and stable  Post vital signs: Reviewed and stable  Last Vitals:  Vitals Value Taken Time  BP    Temp    Pulse 80 09/06/2017 12:58 PM  Resp 0 09/06/2017 12:58 PM  SpO2 96 % 09/06/2017 12:58 PM  Vitals shown include unvalidated device data.  Last Pain:  Vitals:   09/06/17 0500  TempSrc: Oral  PainSc:       Patients Stated Pain Goal: 0 (00/51/10 2111)  Complications: No apparent anesthesia complications

## 2017-09-06 NOTE — Op Note (Signed)
CARDIOTHORACIC SURGERY OPERATIVE NOTE  Date of Procedure: 09/06/2017  Preoperative Diagnosis: Severe 3-vessel Coronary Artery Disease  Postoperative Diagnosis: Same  Procedure:    Coronary Artery Bypass Grafting x 4   Left Internal Mammary Artery to Distal Left Anterior Descending Coronary Artery  Saphenous Vein Graft to Posterior Descending Coronary Artery  Sequential Sapheonous Vein Graft to Second Right Posterolateral Branch Coronary Artery  Saphenous Vein Graft to Second Obtuse Marginal Branch of Left Circumflex Coronary Artery  Endoscopic Vein Harvest from Right Thigh and Lower Leg  Surgeon: Valentina Gu. Roxy Manns, MD  Assistant: Nicholes Rough, PA-C  Anesthesia: Roberts Gaudy, MD  Operative Findings:  Normal left ventricular systolic function  Good quality left internal mammary artery conduit  Good quality saphenous vein conduit  Fair quality target vessels for grafting    BRIEF CLINICAL NOTE AND INDICATIONS FOR SURGERY  Patient is a 67 year old male with no previous history of coronary artery disease but risk factors notable for history of type 2 diabetes mellitus, hypertension, hyperlipidemia, and a strong family history of premature coronary artery disease who has been referred for surgical consultation to discuss treatment options for management of newly discovered severe three-vessel coronary artery disease.  The patient was recently seen by his primary care physician and pulse rate noted to be bradycardic.  Given his past medical history and strong family history he was referred for elective cardiology consultation.  EKGs revealed PVCs and ventricular bigeminy with normal heart rate that made his pulse feel bradycardic.  He denied any history of exertional chest pain or shortness of breath until recently when he experienced a single episode of substernal chest discomfort that occurred while he was walking up a hill to a store.  He was evaluated by Dr. Ellyn Hack and referred  for stress test which was performed August 31, 2017 and felt to be high risk study with medium size defect of moderate severity in the mid anterior apical and apical lateral views consistent with ischemia.  There was h horizontal ST segment depression noted during stress.  The patient was brought in for elective diagnostic cardiac catheterization today by Dr. Ellyn Hack and found to have severe three-vessel coronary artery disease with preserved left ventricular systolic function.  Cardiothoracic surgical consultation was requested.  The patient has been seen in consultation and counseled at length regarding the indications, risks and potential benefits of surgery.  All questions have been answered, and the patient provides full informed consent for the operation as described.   DETAILS OF THE OPERATIVE PROCEDURE  Preparation:  The patient is brought to the operating room on the above mentioned date and central monitoring was established by the anesthesia team including placement of Swan-Ganz catheter and radial arterial line. The patient is placed in the supine position on the operating table.  Intravenous antibiotics are administered. General endotracheal anesthesia is induced uneventfully. A Foley catheter is placed.  Baseline transesophageal echocardiogram was performed.  Findings were notable for normal LV systolic function.  The patient's chest, abdomen, both groins, and both lower extremities are prepared and draped in a sterile manner. A time out procedure is performed.   Surgical Approach and Conduit Harvest:  A median sternotomy incision was performed and the left internal mammary artery is dissected from the chest wall and prepared for bypass grafting. The left internal mammary artery is notably good quality conduit. Simultaneously, the greater saphenous vein is obtained from the patient's right thigh and leg using endoscopic vein harvest technique. The saphenous vein is notably good quality  conduit. After removal of the saphenous vein, the small surgical incisions in the lower extremity are closed with absorbable suture. Following systemic heparinization, the left internal mammary artery was transected distally noted to have excellent flow.   Extracorporeal Cardiopulmonary Bypass and Myocardial Protection:  The pericardium is opened. The ascending aorta is normal in appearance. The ascending aorta and the right atrium are cannulated for cardiopulmonary bypass.  Adequate heparinization is verified.     The entire pre-bypass portion of the operation was notable for stable hemodynamics.  Cardiopulmonary bypass was begun and the surface of the heart is inspected. Distal target vessels are selected for coronary artery bypass grafting. A cardioplegia cannula is placed in the ascending aorta.  A temperature probe was placed in the interventricular septum.  The patient is allowed to cool passively to [facility] systemic temperature.  The aortic cross clamp is applied and cold blood cardioplegia is delivered initially in an antegrade fashion through the aortic root.  Iced saline slush is applied for topical hypothermia.  The initial cardioplegic arrest is rapid with early diastolic arrest.  Repeat doses of cardioplegia are administered intermittently throughout the entire cross clamp portion of the operation through the aortic root and through subsequently placed vein grafts in order to maintain completely flat electrocardiogram and septal myocardial temperature below 15C.  Myocardial protection was felt to be excellent.   Coronary Artery Bypass Grafting:   The posterior descending branch of the right coronary artery was grafted using a reversed saphenous vein graft in an side-to-side fashion.  At the site of distal anastomosis the target vessel was good quality and measured approximately 2.0 mm in diameter.  The second right posterolateral branch of the distal right coronary artery was grafted in  and end-to-side fashion using a sequential saphenous vein graft off of the distal segment of the vein graft placed to the posterior descending coronary artery.  At the site of distal anastomosis the target vessel was good quality and measured approximately 1.5 mm in diameter.  The second obtuse marginal branch of the left circumflex coronary artery was grafted using a reversed saphenous vein graft in an end-to-side fashion.  At the site of distal anastomosis the target vessel was good quality and measured approximately 1.8 mm in diameter.  The distal left anterior coronary artery was grafted with the left internal mammary artery in an end-to-side fashion.  At the site of distal anastomosis the target vessel was fair quality and measured approximately 1.5 mm in diameter.  There was diffuse disease in the distal vessel through which only a 1.0 mm probe would pass.  All proximal vein graft anastomoses were placed directly to the ascending aorta prior to removal of the aortic cross clamp.  The septal myocardial temperature rose rapidly after reperfusion of the left internal mammary artery graft.  The aortic cross clamp was removed after a total cross clamp time of 68 minutes.   Procedure Completion:  All proximal and distal coronary anastomoses were inspected for hemostasis and appropriate graft orientation. Epicardial pacing wires are fixed to the right ventricular outflow tract and to the right atrial appendage. The patient is rewarmed to 37C temperature. The patient is weaned and disconnected from cardiopulmonary bypass.  The patient's rhythm at separation from bypass was sinus.  The patient was weaned from cardiopulmonary bypass without any inotropic support. Total cardiopulmonary bypass time for the operation was 88 minutes.  Followup transesophageal echocardiogram performed after separation from bypass revealed no changes from the preoperative exam.  The aortic  and venous cannula were removed  uneventfully. Protamine was administered to reverse the anticoagulation. The mediastinum and pleural space were inspected for hemostasis and irrigated with saline solution. The mediastinum and the left pleural space were drained using 3 chest tubes placed through separate stab incisions inferiorly.  The soft tissues anterior to the aorta were reapproximated loosely. The sternum is closed with double strength sternal wire. The soft tissues anterior to the sternum were closed in multiple layers and the skin is closed with a running subcuticular skin closure.  The post-bypass portion of the operation was notable for stable rhythm and hemodynamics.  No blood products were administered during the operation.   Disposition:  The patient tolerated the procedure well and is transported to the surgical intensive care in stable condition. There are no intraoperative complications. All sponge instrument and needle counts are verified correct at completion of the operation.    Valentina Gu. Roxy Manns MD 09/06/2017 12:20 PM

## 2017-09-06 NOTE — Progress Notes (Signed)
CTS  extubated and stable post CABG Labs satisfactory

## 2017-09-06 NOTE — Progress Notes (Signed)
      [address]       [place] [address]             [phone]     CARDIOTHORACIC SURGERY PROGRESS NOTE  Subjective: Taylor [name] has been scheduled for Procedure(s): LEFT HEART CATH AND CORONARY ANGIOGRAPHY (N/A) today.   Objective: Vital signs in last 24 hours: Temp:  [97.4 F (36.3 C)-98.6 F (37 C)] 98.6 F (37 C) (07/25 0500) Pulse Rate:  [53-99] 99 (07/25 0500) Cardiac Rhythm: Junctional rhythm (07/25 0343) Resp:  [20-23] 23 (07/25 0500) BP: (124-141)/(61-68) 137/68 (07/25 0500) SpO2:  [94 %-97 %] 97 % (07/25 0500) Weight:  [208 lb (94.3 kg)] 208 lb (94.3 kg) (07/25 0500)  Physical Exam: Unchanged from previously   Intake/Output from previous day: 07/24 0701 - 07/25 0700 In: 764.3 [P.O.:600; I.V.:164.3] Out: -  Intake/Output this shift: No intake/output data recorded.  Lab Results: Recent Labs    09/05/17 0352 09/06/17 0423  WBC 5.4 5.6  HGB 12.9* 12.1*  HCT 39.8 37.1*  PLT 212 197   BMET:  Recent Labs    09/03/17 1115 09/04/17 1117  NA 141 140  K 4.8 4.4  CL 110 107  CO2 23 26  GLUCOSE 117* 140*  BUN 24* 22  CREATININE 1.71* 1.69*  CALCIUM 8.9 9.4    CBG (last 3)  Recent Labs    09/05/17 1150 09/05/17 1612 09/05/17 2113  GLUCAP 107* 120* 129*   PT/INR:   Recent Labs    09/04/17 1117  LABPROT 13.1  INR 1.00    Assessment/Plan:   The various methods of treatment have been discussed with the patient. After consideration of the risks, benefits and treatment options the patient has consented to the planned procedure.   The patient has been seen and labs reviewed. There are no changes in the patient's condition to prevent proceeding with the planned procedure today.   Rexene Alberts, MD 09/06/2017 6:15 AM

## 2017-09-06 NOTE — OR Nursing (Signed)
12:10 - 20 minute call to SICU charge

## 2017-09-06 NOTE — Brief Op Note (Signed)
09/03/2017 - 09/06/2017  12:16 PM  PATIENT:  [name]  67 y.o. male  PRE-OPERATIVE DIAGNOSIS:  CAD  POST-OPERATIVE DIAGNOSIS:  CAD  PROCEDURE:  Procedure(s): CORONARY ARTERY BYPASS GRAFTING TIMES FOUR (CABG) x 4, using left internal mammary artery and right leg greater saphenous vein harvested endoscopically (N/A) TRANSESOPHAGEAL ECHOCARDIOGRAM (TEE) (N/A) LIMA to LAD SVG to OM2 SVG to PDA and RPLB  SURGEON:  Surgeon(s) and Role:    * Rexene Alberts, MD - Primary  PHYSICIAN ASSISTANT:  Nicholes Rough, PA-C  ANESTHESIA:   general  EBL:  605  BLOOD ADMINISTERED:none  DRAINS: ROUTINE   LOCAL MEDICATIONS USED:  NONE  SPECIMEN:  No Specimen  DISPOSITION OF SPECIMEN:  N/A  COUNTS:  YES  DICTATION: .Dragon Dictation  PLAN OF CARE: Admit to inpatient   PATIENT DISPOSITION:  ICU - intubated and hemodynamically stable.   Delay start of Pharmacological VTE agent (>24hrs) due to surgical blood loss or risk of bleeding: yes

## 2017-09-06 NOTE — Anesthesia Preprocedure Evaluation (Signed)
Anesthesia Evaluation  Patient identified by MRN, date of birth, ID band Patient awake    Reviewed: Allergy & Precautions, NPO status , Patient's Chart, lab work & pertinent test results  Airway Mallampati: II  TM Distance: >3 FB Neck ROM: Full    Dental  (+) Teeth Intact   Pulmonary    breath sounds clear to auscultation       Cardiovascular hypertension,  Rhythm:Regular Rate:Normal     Neuro/Psych    GI/Hepatic   Endo/Other  diabetes  Renal/GU      Musculoskeletal   Abdominal   Peds  Hematology   Anesthesia Other Findings   Reproductive/Obstetrics                             Anesthesia Physical Anesthesia Plan  ASA: III  Anesthesia Plan: General   Post-op Pain Management:    Induction: Intravenous  PONV Risk Score and Plan: Ondansetron and Dexamethasone  Airway Management Planned: Oral ETT  Additional Equipment: Arterial line, PA Cath, 3D TEE and Ultrasound Guidance Line Placement  Intra-op Plan:   Post-operative Plan: Post-operative intubation/ventilation  Informed Consent: I have reviewed the patients History and Physical, chart, labs and discussed the procedure including the risks, benefits and alternatives for the proposed anesthesia with the patient or authorized representative who has indicated his/her understanding and acceptance.     Plan Discussed with: CRNA and Anesthesiologist  Anesthesia Plan Comments:         Anesthesia Quick Evaluation

## 2017-09-06 NOTE — Anesthesia Procedure Notes (Signed)
Arterial Line Insertion Start/End7/25/2019 7:00 AM, 09/06/2017 7:10 AM Performed by: CRNA  Patient location: Pre-op. Preanesthetic checklist: patient identified, IV checked, site marked, risks and benefits discussed, surgical consent, monitors and equipment checked, pre-op evaluation and anesthesia consent Patient sedated Left, radial was placed Catheter size: 20 G Hand hygiene performed , maximum sterile barriers used  and Seldinger technique used  Attempts: 1 Procedure performed without using ultrasound guided technique. Following insertion, dressing applied and Biopatch. Post procedure assessment: normal

## 2017-09-07 ENCOUNTER — Encounter (HOSPITAL_COMMUNITY): Payer: Self-pay | Admitting: Thoracic Surgery (Cardiothoracic Vascular Surgery)

## 2017-09-07 ENCOUNTER — Inpatient Hospital Stay (HOSPITAL_COMMUNITY): Payer: PPO

## 2017-09-07 LAB — CBC
HCT: 28.1 % — ABNORMAL LOW (ref 39.0–52.0)
HCT: 29.5 % — ABNORMAL LOW (ref 39.0–52.0)
Hemoglobin: 9.3 g/dL — ABNORMAL LOW (ref 13.0–17.0)
Hemoglobin: 9.5 g/dL — ABNORMAL LOW (ref 13.0–17.0)
MCH: 29 pg (ref 26.0–34.0)
MCH: 28.4 pg (ref 26.0–34.0)
MCHC: 33.1 g/dL (ref 30.0–36.0)
MCHC: 32.2 g/dL (ref 30.0–36.0)
MCV: 87.5 fL (ref 78.0–100.0)
MCV: 88.3 fL (ref 78.0–100.0)
Platelets: 158 10*3/uL (ref 150–400)
Platelets: 155 10*3/uL (ref 150–400)
RBC: 3.21 MIL/uL — ABNORMAL LOW (ref 4.22–5.81)
RBC: 3.34 MIL/uL — ABNORMAL LOW (ref 4.22–5.81)
RDW: 13.3 % (ref 11.5–15.5)
RDW: 13.8 % (ref 11.5–15.5)
WBC: 7.8 10*3/uL (ref 4.0–10.5)
WBC: 7.2 10*3/uL (ref 4.0–10.5)

## 2017-09-07 LAB — BASIC METABOLIC PANEL
Anion gap: 7 (ref 5–15)
BUN: 19 mg/dL (ref 8–23)
CO2: 23 mmol/L (ref 22–32)
Calcium: 7.7 mg/dL — ABNORMAL LOW (ref 8.9–10.3)
Chloride: 110 mmol/L (ref 98–111)
Creatinine, Ser: 1.53 mg/dL — ABNORMAL HIGH (ref 0.61–1.24)
GFR calc Af Amer: 53 mL/min — ABNORMAL LOW (ref >60)
GFR calc non Af Amer: 45 mL/min — ABNORMAL LOW (ref >60)
Glucose, Bld: 106 mg/dL — ABNORMAL HIGH (ref 70–99)
Potassium: 4.3 mmol/L (ref 3.5–5.1)
Sodium: 140 mmol/L (ref 135–145)

## 2017-09-07 LAB — POCT I-STAT, CHEM 8
BUN: 21 mg/dL (ref 8–23)
Calcium, Ion: 1.11 mmol/L — ABNORMAL LOW (ref 1.15–1.40)
Chloride: 105 mmol/L (ref 98–111)
Creatinine, Ser: 1.5 mg/dL — ABNORMAL HIGH (ref 0.61–1.24)
Glucose, Bld: 166 mg/dL — ABNORMAL HIGH (ref 70–99)
HCT: 28 % — ABNORMAL LOW (ref 39.0–52.0)
Hemoglobin: 9.5 g/dL — ABNORMAL LOW (ref 13.0–17.0)
Potassium: 4.1 mmol/L (ref 3.5–5.1)
Sodium: 140 mmol/L (ref 135–145)
TCO2: 22 mmol/L (ref 22–32)

## 2017-09-07 LAB — GLUCOSE, CAPILLARY
Glucose-Capillary: 101 mg/dL — ABNORMAL HIGH (ref 70–99)
Glucose-Capillary: 102 mg/dL — ABNORMAL HIGH (ref 70–99)
Glucose-Capillary: 102 mg/dL — ABNORMAL HIGH (ref 70–99)
Glucose-Capillary: 103 mg/dL — ABNORMAL HIGH (ref 70–99)
Glucose-Capillary: 104 mg/dL — ABNORMAL HIGH (ref 70–99)
Glucose-Capillary: 105 mg/dL — ABNORMAL HIGH (ref 70–99)
Glucose-Capillary: 106 mg/dL — ABNORMAL HIGH (ref 70–99)
Glucose-Capillary: 107 mg/dL — ABNORMAL HIGH (ref 70–99)
Glucose-Capillary: 109 mg/dL — ABNORMAL HIGH (ref 70–99)
Glucose-Capillary: 111 mg/dL — ABNORMAL HIGH (ref 70–99)
Glucose-Capillary: 116 mg/dL — ABNORMAL HIGH (ref 70–99)
Glucose-Capillary: 117 mg/dL — ABNORMAL HIGH (ref 70–99)
Glucose-Capillary: 121 mg/dL — ABNORMAL HIGH (ref 70–99)
Glucose-Capillary: 124 mg/dL — ABNORMAL HIGH (ref 70–99)
Glucose-Capillary: 138 mg/dL — ABNORMAL HIGH (ref 70–99)
Glucose-Capillary: 186 mg/dL — ABNORMAL HIGH (ref 70–99)
Glucose-Capillary: 96 mg/dL (ref 70–99)

## 2017-09-07 LAB — MAGNESIUM
Magnesium: 2.3 mg/dL (ref 1.7–2.4)
Magnesium: 2.2 mg/dL (ref 1.7–2.4)

## 2017-09-07 LAB — CREATININE, SERUM
Creatinine, Ser: 1.59 mg/dL — ABNORMAL HIGH (ref 0.61–1.24)
GFR calc Af Amer: 50 mL/min — ABNORMAL LOW (ref >60)
GFR calc non Af Amer: 43 mL/min — ABNORMAL LOW (ref >60)

## 2017-09-07 MED ORDER — ENOXAPARIN SODIUM 30 MG/0.3ML ~~LOC~~ SOLN
30.0000 mg | Freq: Every day | SUBCUTANEOUS | Status: DC
  Administered 2017-09-08 – 2017-09-10 (×3): 30 mg via SUBCUTANEOUS
  Filled 2017-09-07 (×3): qty 0.3

## 2017-09-07 MED ORDER — INSULIN DETEMIR 100 UNIT/ML ~~LOC~~ SOLN
10.0000 [IU] | Freq: Every day | SUBCUTANEOUS | Status: DC
  Administered 2017-09-07 – 2017-09-09 (×3): 10 [IU] via SUBCUTANEOUS
  Filled 2017-09-07 (×3): qty 0.1

## 2017-09-07 MED ORDER — INSULIN ASPART 100 UNIT/ML ~~LOC~~ SOLN
0.0000 [IU] | SUBCUTANEOUS | Status: DC
  Administered 2017-09-07: 4 [IU] via SUBCUTANEOUS
  Administered 2017-09-07 – 2017-09-08 (×2): 2 [IU] via SUBCUTANEOUS
  Administered 2017-09-08: 4 [IU] via SUBCUTANEOUS
  Administered 2017-09-08: 2 [IU] via SUBCUTANEOUS

## 2017-09-07 NOTE — Progress Notes (Signed)
      [address]       [place] [address]             [phone]      POD # 1 CABG  Resting comfortably  BP (!) 131/48   Pulse 87   Temp 98 F (36.7 C) (Oral)   Resp (!) 21   Ht 5\' 11"  (1.803 m)   Wt 216 lb 4.8 oz (98.1 kg)   SpO2 97%   BMI 30.17 kg/m    Intake/Output Summary (Last 24 hours) at 09/07/2017 1710 Last data filed at 09/07/2017 1200 Gross per 24 hour  Intake 2580.94 ml  Output 1485 ml  Net 1095.94 ml   CBG well controlled  PM labs pending  Doing well POD # 1  Steven C. Roxan Hockey, MD [facility] [phone]

## 2017-09-07 NOTE — Anesthesia Postprocedure Evaluation (Signed)
Anesthesia Post Note  Patient: Taylor [name]  Procedure(s) Performed: CORONARY ARTERY BYPASS GRAFTING (CABG) x four, using left internal mammary artery and right leg greater saphenous vein harvested endoscopically (N/A Chest) TRANSESOPHAGEAL ECHOCARDIOGRAM (TEE) (N/A )     Patient location during evaluation: SICU Anesthesia Type: General Level of consciousness: awake and alert Pain management: pain level controlled Vital Signs Assessment: post-procedure vital signs reviewed and stable Respiratory status: spontaneous breathing, nonlabored ventilation, respiratory function stable and patient connected to nasal cannula oxygen Cardiovascular status: blood pressure returned to baseline and stable Postop Assessment: no apparent nausea or vomiting Anesthetic complications: no    Last Vitals:  Vitals:   09/07/17 1300 09/07/17 1400  BP: 124/69 (!) 131/48  Pulse: 87 87  Resp: (!) 21 (!) 21  Temp:    SpO2: 100% 97%    Last Pain:  Vitals:   09/07/17 1152  TempSrc: Oral  PainSc:                  [name]

## 2017-09-07 NOTE — Discharge Instructions (Signed)

## 2017-09-07 NOTE — Care Management Note (Signed)
Case Management Note Marvetta Gibbons RN,BSN [facility]22 Case Manager  [phone]  Patient Details  Name: Taylor [name] MRN: [id number] Date of Birth: [birth year]  Subjective/Objective:  Pt admitted with CAD severe 3VD, s/p CABG x4 on 09/06/17                 Action/Plan: PTA pt lived at home with wife, anticipate return home, CM to follow for transition of care needs  Expected Discharge Date:                  Expected Discharge Plan:  Home/Self Care  In-House Referral:     Discharge planning Services  CM Consult  Post Acute Care Choice:    Choice offered to:     DME Arranged:    DME Agency:     HH Arranged:    HH Agency:     Status of Service:  In process, will continue to follow  If discussed at Long Length of Stay Meetings, dates discussed:    Additional Comments:  Dawayne Patricia, RN 09/07/2017, 2:58 PM

## 2017-09-07 NOTE — Progress Notes (Addendum)
TCTS DAILY ICU PROGRESS NOTE                   [address]            [place] [address]          [phone]   1 Day Post-Op Procedure(s) (LRB): CORONARY ARTERY BYPASS GRAFTING (CABG) x four, using left internal mammary artery and right leg greater saphenous vein harvested endoscopically (N/A) TRANSESOPHAGEAL ECHOCARDIOGRAM (TEE) (N/A)  Total Length of Stay:  LOS: 4 days   Subjective: Feels okay this morning. He is looking forward to getting to the chair today.   Objective: Vital signs in last 24 hours: Temp:  [96.6 F (35.9 C)-99.7 F (37.6 C)] 98.4 F (36.9 C) (07/26 0700) Pulse Rate:  [79-89] 87 (07/26 0700) Cardiac Rhythm: Atrial paced (07/26 0400) Resp:  [10-28] 22 (07/26 0700) BP: (91-128)/(46-68) 112/63 (07/26 0700) SpO2:  [96 %-100 %] 98 % (07/26 0700) Arterial Line BP: (88-291)/(45-282) 291/282 (07/26 0700) FiO2 (%):  [40 %-50 %] 40 % (07/25 1719) Weight:  [98.1 kg (216 lb 4.8 oz)] 98.1 kg (216 lb 4.8 oz) (07/26 0500)  Filed Weights   09/04/17 0500 09/05/17 0608 09/07/17 0500  Weight: 92.9 kg (204 lb 14.4 oz) 91.4 kg (201 lb 9.6 oz) 98.1 kg (216 lb 4.8 oz)    Weight change:    Hemodynamic parameters for last 24 hours: PAP: (20-38)/(6-15) 28/9 CO:  [4 L/min-6.7 L/min] 6.7 L/min CI:  [1.9 L/min/m2-3.2 L/min/m2] 3.2 L/min/m2  Intake/Output from previous day: 07/25 0701 - 07/26 0700 In: 5837.8 [I.V.:4303; Blood:235; IV Piggyback:1299.8] Out: 3590 [Urine:2605; Blood:605; Chest Tube:380]  Intake/Output this shift: No intake/output data recorded.  Current Meds: Scheduled Meds: . acetaminophen  1,000 mg Oral Q6H  . aspirin EC  325 mg Oral Daily  . atorvastatin  80 mg Oral q1800  . bisacodyl  10 mg Oral Daily   Or  . bisacodyl  10 mg Rectal Daily  . Chlorhexidine Gluconate Cloth  6 each Topical Daily  . docusate sodium  200 mg Oral Daily  . [START ON 09/08/2017] enoxaparin (LOVENOX) injection  30 mg Subcutaneous QHS  . insulin aspart   0-24 Units Subcutaneous Q4H  . mouth rinse  15 mL Mouth Rinse BID  . metoprolol tartrate  12.5 mg Oral BID  . [START ON 09/08/2017] pantoprazole  40 mg Oral Daily  . sodium chloride flush  10-40 mL Intracatheter Q12H   Continuous Infusions: . sodium chloride    . albumin human 250 mL (09/06/17 2039)  . cefUROXime (ZINACEF)  IV Stopped (09/07/17 0434)  . lactated ringers    . lactated ringers    . phenylephrine (NEO-SYNEPHRINE) Adult infusion 10 mcg/min (09/07/17 0600)   PRN Meds:.albumin human, metoprolol tartrate, morphine injection, ondansetron (ZOFRAN) IV, oxyCODONE, sodium chloride flush, traMADol  General appearance: alert, cooperative and no distress Heart: regular rate and rhythm, S1, S2 normal, no murmur, click, rub or gallop Lungs: clear to auscultation bilaterally, diminished in the lower lobes Abdomen: soft, non-tender, few bowel sounds Extremities: upper and lower extremity edema Wound: clean and dry dressed with a sterile dressing  Lab Results: CBC: Recent Labs    09/06/17 1907 09/06/17 1908 09/07/17 0405  WBC 9.1  --  7.8  HGB 10.3* 10.2* 9.3*  HCT 31.7* 30.0* 28.1*  PLT 163  --  158   BMET:  Recent Labs    09/04/17 1117  09/06/17 1908 09/07/17 0405  NA 140   < >  140 140  K 4.4   < > 4.6 4.3  CL 107   < > 106 110  CO2 26  --   --  23  GLUCOSE 140*   < > 139* 106*  BUN 22   < > 22 19  CREATININE 1.69*   < > 1.50* 1.53*  CALCIUM 9.4  --   --  7.7*   < > = values in this interval not displayed.    CMET: Lab Results  Component Value Date   WBC 7.8 09/07/2017   HGB 9.3 (L) 09/07/2017   HCT 28.1 (L) 09/07/2017   PLT 158 09/07/2017   GLUCOSE 106 (H) 09/07/2017   ALT 14 09/04/2017   AST 16 09/04/2017   NA 140 09/07/2017   K 4.3 09/07/2017   CL 110 09/07/2017   CREATININE 1.53 (H) 09/07/2017   BUN 19 09/07/2017   CO2 23 09/07/2017   INR 1.26 09/06/2017   HGBA1C 7.6 (H) 09/04/2017      PT/INR:  Recent Labs    09/06/17 1310  LABPROT 15.7*   INR 1.26   Radiology: Dg Chest Port 1 View  Result Date: 09/07/2017 CLINICAL DATA:  CABG EXAM: PORTABLE CHEST 1 VIEW COMPARISON:  09/06/2017 FINDINGS: Endotracheal tube removed. NG tube removed. Swan-Ganz catheter tip right pulmonary artery. Mediastinal drain in place. Left chest tube in place. Negative for pneumothorax. Hypoventilation with progression of bibasilar atelectasis left greater than right. Small bilateral pleural effusions. IMPRESSION: Endotracheal tube and NG tube removed. Hypoventilation with progression of bibasilar airspace disease left greater than right. No pneumothorax. Electronically Signed   By: Franchot Gallo M.D.   On: 09/07/2017 07:19   Dg Chest Port 1 View  Result Date: 09/06/2017 CLINICAL DATA:  Status post CABG.  Intubated. EXAM: PORTABLE CHEST 1 VIEW COMPARISON:  09/04/2017. FINDINGS: Interval post CABG changes with mediastinal and left chest tubes. No pneumothorax. Right jugular Swan-Ganz catheter tip in the proximal right main pulmonary artery. Endotracheal tube in satisfactory position. Nasogastric tube tip and side hole in the proximal stomach. Poor inspiration with stable enlargement of the cardiac silhouette. Interval linear and patchy opacity at the left lung base and minimal linear density in the right mid lung zone. Thoracic spine degenerative changes. IMPRESSION: 1. Status post CABG without pneumothorax. 2. Left lower lobe atelectasis and minimal atelectasis in the right mid lung zone. 3. Stable cardiomegaly. Electronically Signed   By: Claudie Revering M.D.   On: 09/06/2017 13:23     Assessment/Plan: S/P Procedure(s) (LRB): CORONARY ARTERY BYPASS GRAFTING (CABG) x four, using left internal mammary artery and right leg greater saphenous vein harvested endoscopically (N/A) TRANSESOPHAGEAL ECHOCARDIOGRAM (TEE) (N/A)  1. CV-Atrial paced at 88bpm, underlying rhythm in the 70s with PVCs. BP stable with MAPs in the 70s. Remains on Neo, weaning as tolerated. Continue  ASA and Statin therapy. 2. Pulm-tolerating 3L [place] with good oxygen saturation. Chest tubes put out 380cc since surgery-keep for now. CXR reviewed and stable 3. Renal-creatinine 1.53, baseline appears to be 1.6. Great urine output. Fluid overloaded by 12 lbs. 4. H and H 9.3/28.1, expected acute blood loss anemia. 5. Platelets 158k, expected acute thrombocytopenia 6. Blood glucose level well controlled. Transition from Insulin gtt to SSI and Levemir once tolerating more oral intake. On Janumet at home, Hemoglobin A1C is 7.6 7. Lovenox for DVT prophylaxis.   Plan: Discontinue swan ganz catheter. Weaning Neo and can discontinue A-line once off. OOB to chair. Keep chest tubes for now due to output.  Work on pain control. Encouraged incentive spirometer use.      Elgie Collard 09/07/2017 7:53 AM     I have seen and examined the patient and agree with the assessment and plan as outlined.  Mobilize.  D/C lines.  Diuresis once BP stable off Neo.  D/C tubes later today or tomorrow, depending on output.  Routine early postop.    Rexene Alberts, MD 09/07/2017 9:16 AM

## 2017-09-07 NOTE — Discharge Summary (Signed)
[address]       [place] [address]             [phone]      Physician Discharge Summary  Patient ID: Taylor [name] MRN: [id number] DOB/AGE: 1950/05/12 67 y.o.  Admit date: 09/03/2017 Discharge date: 09/11/2017  Admission Diagnoses: Patient Active Problem List   Diagnosis Date Noted  . Coronary artery disease involving native coronary artery of native heart with unstable angina pectoris ([place]) 09/03/2017  . Abnormal nuclear stress test 08/31/2017  . Ventricular bigeminy 08/29/2017  . Essential hypertension 08/29/2017  . Hyperlipidemia associated with type 2 diabetes mellitus ([place]) 08/29/2017  . Family history of premature coronary artery disease 08/29/2017  . Frequent unifocal PVCs 08/29/2017  . Chest pain with moderate risk for cardiac etiology 08/29/2017   Discharge Diagnoses:  Principal Problem:   S/P CABG x 4 Active Problems:   Abnormal nuclear stress test   Coronary artery disease involving native coronary artery of native heart with unstable angina pectoris [facility])   Discharged Condition: good  HPI:    Patient is a 67 year old male with no previous history of coronary artery disease but risk factors notable for history of type 2 diabetes mellitus, hypertension, hyperlipidemia, and a strong family history of premature coronary artery disease who has been referred for surgical consultation to discuss treatment options for management of newly discovered severe three-vessel coronary artery disease.  The patient was recently seen by his primary care physician and pulse rate noted to be bradycardic.  Given his past medical history and strong family history he was referred for elective cardiology consultation.  EKGs revealed PVCs and ventricular bigeminy with normal heart rate that made his pulse feel bradycardic.  He denied any history of exertional chest pain or shortness of breath until recently when he experienced a single episode of substernal  chest discomfort that occurred while he was walking up a hill to a store.  He was evaluated by Dr. Ellyn Hack and referred for stress test which was performed August 31, 2017 and felt to be high risk study with medium size defect of moderate severity in the mid anterior apical and apical lateral views consistent with ischemia.  There was h horizontal ST segment depression noted during stress.  The patient was brought in for elective diagnostic cardiac catheterization today by Dr. Ellyn Hack and found to have severe three-vessel coronary artery disease with preserved left ventricular systolic function.  Cardiothoracic surgical consultation was requested.  The patient is married and lives locally in [place] with his family.  He has 2 adult children and several grandchildren.  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  He reports no physical limitations and states that he has remained physically active until presently.  He reports only a single episode of substernal chest discomfort as noted previously which resolved within 2 to 3 minutes of rest.  He otherwise denies any symptoms of chest pain or chest discomfort either with activity or at rest.  He denies any history of exertional shortness of breath, PND, orthopnea, or lower extremity edema.  He reports occasional palpitation without dizzy spells or syncope.   Hospital Course:   On 09/06/2017 Mr. Denicola underwent a coronary bypass grafting x4 with Dr. Roxy Manns.  He tolerated the procedure well and was transferred to the surgical ICU.  He was extubated timely manner.  Postop day 1 he remained atrial paced at 88 bpm with an  underlying rhythm in the 70s.  He remained on Neo-Synephrine which was being weaned as tolerated.  He was tolerating 3 L of nasal cannula for oxygen support.  We continued his chest tubes due to output.  His creatinine was elevated at 1.53 but this appears to be his baseline.  He remained fluid  overloaded and we initiated a diuretic regimen once he was weaned from his neo.  He was started on Lovenox for DVT prophylaxis.  We initiated SSI and Levemir once he was tolerating more of a diet.  His Swan-Ganz catheter was removed.  We worked on mobilization and assisting him to the chair. He was transferred to the telemetry unit late on 7/28. He got his epicardial pacing wires removed on 7/29 without difficultly. Today, he is ambulating with limited assistance, tolerating room air, his incisions are healing well, and he is ready for discharge home.  Consults: None  Significant Diagnostic Studies:   CLINICAL DATA:  CABG  EXAM: PORTABLE CHEST 1 VIEW  COMPARISON:  09/06/2017  FINDINGS: Endotracheal tube removed. NG tube removed. Swan-Ganz catheter tip right pulmonary artery. Mediastinal drain in place. Left chest tube in place.  Negative for pneumothorax. Hypoventilation with progression of bibasilar atelectasis left greater than right. Small bilateral pleural effusions.  IMPRESSION: Endotracheal tube and NG tube removed.  Hypoventilation with progression of bibasilar airspace disease left greater than right. No pneumothorax.   Electronically Signed   By: Franchot Gallo M.D.   On: 09/07/2017 07:19   Treatments:   CARDIOTHORACIC SURGERY OPERATIVE NOTE  Date of Procedure:    09/06/2017  Preoperative Diagnosis:      Severe 3-vessel Coronary Artery Disease  Postoperative Diagnosis:    Same  Procedure:        Coronary Artery Bypass Grafting x 4              Left Internal Mammary Artery to Distal Left Anterior Descending Coronary Artery             Saphenous Vein Graft to Posterior Descending Coronary Artery             Sequential Sapheonous Vein Graft to Second Right Posterolateral Branch Coronary Artery             Saphenous Vein Graft to Second Obtuse Marginal Branch of Left Circumflex Coronary Artery             Endoscopic Vein Harvest from Right Thigh  and Lower Leg  Surgeon:        Valentina Gu. Roxy Manns, MD  Assistant:       Nicholes Rough, PA-C  Anesthesia:    Roberts Gaudy, MD  Operative Findings: ? Normal left ventricular systolic function ? Good quality left internal mammary artery conduit ? Good quality saphenous vein conduit ? Fair quality target vessels for grafting    Discharge Exam: Blood pressure (!) 158/66, pulse 93, temperature 98.4 F (36.9 C), temperature source Oral, resp. rate (!) 26, height 5\' 11"  (1.803 m), weight 91.9 kg (202 lb 9.6 oz), SpO2 96 %.    General appearance: alert, cooperative and no distress Heart: regular rate and rhythm, S1, S2 normal, no murmur, click, rub or gallop Lungs: clear to auscultation bilaterally Abdomen: soft, non-tender; bowel sounds normal; no masses,  no organomegaly Extremities: extremities normal, atraumatic, no cyanosis or edema Wound: clean and dry    Disposition: Discharge disposition: 01-Home or Self Care        Allergies as of 09/11/2017   No Known Allergies  Medication List    STOP taking these medications   aspirin 81 MG tablet Replaced by:  aspirin 325 MG EC tablet     TAKE these medications   acetaminophen 500 MG tablet Commonly known as:  TYLENOL Take 2 tablets (1,000 mg total) by mouth every 6 (six) hours.   aspirin 325 MG EC tablet Take 1 tablet (325 mg total) by mouth daily. Replaces:  aspirin 81 MG tablet   atorvastatin 80 MG tablet Commonly known as:  LIPITOR Take 1 tablet (80 mg total) by mouth daily at 6 PM. What changed:    medication strength  how much to take  when to take this   lisinopril 5 MG tablet Commonly known as:  PRINIVIL,ZESTRIL Take 1 tablet (5 mg total) by mouth daily. What changed:    medication strength  how much to take  when to take this   metoprolol tartrate 25 MG tablet Commonly known as:  LOPRESSOR Take 1 tablet (25 mg total) by mouth 2 (two) times daily. What changed:  how much to take     multivitamin tablet Take 1 tablet by mouth daily.   oxyCODONE 5 MG immediate release tablet Commonly known as:  Oxy IR/ROXICODONE Take 1 tablet (5 mg total) by mouth every 6 (six) hours as needed for severe pain.   sitaGLIPtin-metformin 50-500 MG tablet Commonly known as:  JANUMET Take 1 tablet by mouth at bedtime.      Follow-up Information    Tisovec, Fransico Him, MD. Call in 1 day(s).   Specialty:  Internal Medicine Contact information: [address] [place] [address] [phone]        Leonie Man, MD Follow up.   Specialty:  Cardiology Why:  Your appointment is on 09/14/2017 at 10:40. Please bring your medication list. Contact information: [address] [place] [address] [phone]        Rexene Alberts, MD Follow up.   Specialty:  Cardiothoracic Surgery Why:  Your follow-up appointment is on 10/08/2017 at 2:30pm. Please arrive at 2pm for a chest xray located at [facility] which is on the first floor of our building.  Contact information: [address] [place] [address] [phone]          The patient has been discharged on:   1.Beta Blocker:  Yes [ x  ]                              No   [   ]                              If No, reason:  2.Ace Inhibitor/ARB: Yes [ x  ]                                     No  [    ]                                     If No, reason:  3.Statin:   Yes [ x  ]                  No  [   ]  If No, reason:  4.Ecasa:  Yes  [ x  ]                  No   [   ]                  If No, reason:    Signed: Elgie Collard 09/11/2017, 9:32 AM

## 2017-09-07 NOTE — Progress Notes (Signed)
Anesthesiology Follow up:  Awake and alert, neuro intact with minimal incisional pain with coughing and deep breathing.  VS: T-36.9 BP-115/46 HR- 80 (a-paced) RR- 22 O2 Sat 98% on 3L [place]  PA 32/11 CO/CI 6.7/3.2  BUN/Cr-19/1.53 K-4.3 glucose- 171 H/H- 9.3/28 Platelets- 158,000  Extubated 5 hours post-op.   67 year old male one day S/P CABG X 4 with preserved LV function. Stable post-op course, no apparent complications.  Taylor [name]

## 2017-09-08 ENCOUNTER — Inpatient Hospital Stay (HOSPITAL_COMMUNITY): Payer: PPO

## 2017-09-08 DIAGNOSIS — Z951 Presence of aortocoronary bypass graft: Secondary | ICD-10-CM

## 2017-09-08 LAB — GLUCOSE, CAPILLARY
Glucose-Capillary: 129 mg/dL — ABNORMAL HIGH (ref 70–99)
Glucose-Capillary: 166 mg/dL — ABNORMAL HIGH (ref 70–99)
Glucose-Capillary: 161 mg/dL — ABNORMAL HIGH (ref 70–99)
Glucose-Capillary: 136 mg/dL — ABNORMAL HIGH (ref 70–99)
Glucose-Capillary: 147 mg/dL — ABNORMAL HIGH (ref 70–99)

## 2017-09-08 LAB — BASIC METABOLIC PANEL
Anion gap: 7 (ref 5–15)
BUN: 18 mg/dL (ref 8–23)
CO2: 25 mmol/L (ref 22–32)
Calcium: 6.5 mg/dL — ABNORMAL LOW (ref 8.9–10.3)
Chloride: 109 mmol/L (ref 98–111)
Creatinine, Ser: 1.43 mg/dL — ABNORMAL HIGH (ref 0.61–1.24)
GFR calc Af Amer: 57 mL/min — ABNORMAL LOW (ref >60)
GFR calc non Af Amer: 49 mL/min — ABNORMAL LOW (ref >60)
Glucose, Bld: 131 mg/dL — ABNORMAL HIGH (ref 70–99)
Potassium: 5 mmol/L (ref 3.5–5.1)
Sodium: 141 mmol/L (ref 135–145)

## 2017-09-08 LAB — CBC
HCT: 28.5 % — ABNORMAL LOW (ref 39.0–52.0)
Hemoglobin: 9.3 g/dL — ABNORMAL LOW (ref 13.0–17.0)
MCH: 29 pg (ref 26.0–34.0)
MCHC: 32.6 g/dL (ref 30.0–36.0)
MCV: 88.8 fL (ref 78.0–100.0)
Platelets: 162 10*3/uL (ref 150–400)
RBC: 3.21 MIL/uL — ABNORMAL LOW (ref 4.22–5.81)
RDW: 14 % (ref 11.5–15.5)
WBC: 8.1 10*3/uL (ref 4.0–10.5)

## 2017-09-08 MED ORDER — FUROSEMIDE 10 MG/ML IJ SOLN
40.0000 mg | Freq: Once | INTRAMUSCULAR | Status: AC
  Administered 2017-09-08: 40 mg via INTRAVENOUS
  Filled 2017-09-08: qty 4

## 2017-09-08 MED ORDER — INSULIN ASPART 100 UNIT/ML ~~LOC~~ SOLN: 0.0000 [IU] | Freq: Every day | SUBCUTANEOUS | Status: DC

## 2017-09-08 MED ORDER — INSULIN ASPART 100 UNIT/ML ~~LOC~~ SOLN
0.0000 [IU] | Freq: Three times a day (TID) | SUBCUTANEOUS | Status: DC
  Administered 2017-09-08: 3 [IU] via SUBCUTANEOUS
  Administered 2017-09-08: 2 [IU] via SUBCUTANEOUS
  Administered 2017-09-09: 3 [IU] via SUBCUTANEOUS
  Administered 2017-09-09 (×2): 2 [IU] via SUBCUTANEOUS
  Administered 2017-09-10: 3 [IU] via SUBCUTANEOUS
  Administered 2017-09-10 – 2017-09-11 (×2): 2 [IU] via SUBCUTANEOUS

## 2017-09-08 NOTE — Plan of Care (Signed)

## 2017-09-08 NOTE — Progress Notes (Signed)
      [address]       [place] [address]             [phone]      Resting comfortably No complaints Good response to lasix earlier today SR with PVCs  Kimball Manske C. Roxan Hockey, MD [facility] [phone]

## 2017-09-08 NOTE — Progress Notes (Signed)
2 Days Post-Op Procedure(s) (LRB): CORONARY ARTERY BYPASS GRAFTING (CABG) x four, using left internal mammary artery and right leg greater saphenous vein harvested endoscopically (N/A) TRANSESOPHAGEAL ECHOCARDIOGRAM (TEE) (N/A) Subjective: No complaints at present  Objective: Vital signs in last 24 hours: Temp:  [98 F (36.7 C)-98.2 F (36.8 C)] 98.2 F (36.8 C) (07/27 0700) Pulse Rate:  [29-89] 48 (07/27 0800) Cardiac Rhythm: Normal sinus rhythm;Junctional rhythm (07/27 0800) Resp:  [21-31] 22 (07/27 0800) BP: (110-162)/(47-78) 153/47 (07/27 0800) SpO2:  [93 %-100 %] 97 % (07/27 0800) Arterial Line BP: (147-156)/(54-57) 147/54 (07/26 1200) Weight:  [215 lb 6.2 oz (97.7 kg)] 215 lb 6.2 oz (97.7 kg) (07/27 0600)  Hemodynamic parameters for last 24 hours:    Intake/Output from previous day: 07/26 0701 - 07/27 0700 In: 862.3 [P.O.:690; I.V.:51.9; IV Piggyback:120.4] Out: 1825 [Urine:1545; Chest Tube:280] Intake/Output this shift: Total I/O In: -  Out: 140 [Urine:110; Chest Tube:30]  General appearance: alert, cooperative and no distress Neurologic: intact Heart: regular rate and rhythm Lungs: diminished breath sounds bibasilar Abdomen: normal findings: soft, non-tender  Lab Results: Recent Labs    09/07/17 1656 09/07/17 1657 09/08/17 0352  WBC 7.2  --  8.1  HGB 9.5* 9.5* 9.3*  HCT 29.5* 28.0* 28.5*  PLT 155  --  162   BMET:  Recent Labs    09/07/17 0405  09/07/17 1657 09/08/17 0352  NA 140  --  140 141  K 4.3  --  4.1 5.0  CL 110  --  105 109  CO2 23  --   --  25  GLUCOSE 106*  --  166* 131*  BUN 19  --  21 18  CREATININE 1.53*   < > 1.50* 1.43*  CALCIUM 7.7*  --   --  6.5*   < > = values in this interval not displayed.    PT/INR:  Recent Labs    09/06/17 1310  LABPROT 15.7*  INR 1.26   ABG    Component Value Date/Time   PHART 7.339 (L) 09/06/2017 1902   HCO3 23.2 09/06/2017 1902   TCO2 22 09/07/2017 1657   ACIDBASEDEF 3.0 (H) 09/06/2017 1902    O2SAT 99.0 09/06/2017 1902   CBG (last 3)  Recent Labs    09/07/17 2314 09/08/17 0315 09/08/17 0752  GLUCAP 124* 129* 166*    Assessment/Plan: S/P Procedure(s) (LRB): CORONARY ARTERY BYPASS GRAFTING (CABG) x four, using left internal mammary artery and right leg greater saphenous vein harvested endoscopically (N/A) TRANSESOPHAGEAL ECHOCARDIOGRAM (TEE) (N/A) -POD # 2 CABG  CV- in SR with occasional bigeminy, pacer off   RESP- Continue IS  RENAL- off neo drip- will give IV lasix this AM  Creatinine still elevated but down from yesterday  ENDO- CBG well controlled change to AC/HS  Enoxaparin for DVT prophylaxis  Cardiac rehab     LOS: 5 days    Melrose Nakayama 09/08/2017

## 2017-09-09 ENCOUNTER — Inpatient Hospital Stay (HOSPITAL_COMMUNITY): Payer: PPO

## 2017-09-09 LAB — CBC
HCT: 28.7 % — ABNORMAL LOW (ref 39.0–52.0)
Hemoglobin: 9.2 g/dL — ABNORMAL LOW (ref 13.0–17.0)
MCH: 28.6 pg (ref 26.0–34.0)
MCHC: 32.1 g/dL (ref 30.0–36.0)
MCV: 89.1 fL (ref 78.0–100.0)
Platelets: 165 10*3/uL (ref 150–400)
RBC: 3.22 MIL/uL — ABNORMAL LOW (ref 4.22–5.81)
RDW: 14.1 % (ref 11.5–15.5)
WBC: 7.2 10*3/uL (ref 4.0–10.5)

## 2017-09-09 LAB — BASIC METABOLIC PANEL
Anion gap: 8 (ref 5–15)
BUN: 19 mg/dL (ref 8–23)
CO2: 26 mmol/L (ref 22–32)
Calcium: 8.2 mg/dL — ABNORMAL LOW (ref 8.9–10.3)
Chloride: 108 mmol/L (ref 98–111)
Creatinine, Ser: 1.37 mg/dL — ABNORMAL HIGH (ref 0.61–1.24)
GFR calc Af Amer: >60 mL/min (ref >60)
GFR calc non Af Amer: 52 mL/min — ABNORMAL LOW (ref >60)
Glucose, Bld: 144 mg/dL — ABNORMAL HIGH (ref 70–99)
Potassium: 3.6 mmol/L (ref 3.5–5.1)
Sodium: 142 mmol/L (ref 135–145)

## 2017-09-09 LAB — GLUCOSE, CAPILLARY
Glucose-Capillary: 130 mg/dL — ABNORMAL HIGH (ref 70–99)
Glucose-Capillary: 193 mg/dL — ABNORMAL HIGH (ref 70–99)
Glucose-Capillary: 185 mg/dL — ABNORMAL HIGH (ref 70–99)
Glucose-Capillary: 142 mg/dL — ABNORMAL HIGH (ref 70–99)
Glucose-Capillary: 139 mg/dL — ABNORMAL HIGH (ref 70–99)

## 2017-09-09 MED ORDER — SITAGLIPTIN PHOS-METFORMIN HCL 50-500 MG PO TABS: 1.0000 | ORAL_TABLET | Freq: Every day | ORAL | Status: DC

## 2017-09-09 MED ORDER — METOPROLOL TARTRATE 25 MG PO TABS
25.0000 mg | ORAL_TABLET | Freq: Two times a day (BID) | ORAL | Status: DC
  Administered 2017-09-09 – 2017-09-11 (×4): 25 mg via ORAL
  Filled 2017-09-09 (×4): qty 1

## 2017-09-09 MED ORDER — LINAGLIPTIN 5 MG PO TABS
5.0000 mg | ORAL_TABLET | Freq: Every day | ORAL | Status: DC
  Administered 2017-09-09 – 2017-09-10 (×2): 5 mg via ORAL
  Filled 2017-09-09 (×2): qty 1

## 2017-09-09 MED ORDER — FUROSEMIDE 40 MG PO TABS
40.0000 mg | ORAL_TABLET | Freq: Every day | ORAL | Status: DC
  Administered 2017-09-09 – 2017-09-11 (×3): 40 mg via ORAL
  Filled 2017-09-09 (×3): qty 1

## 2017-09-09 MED ORDER — METFORMIN HCL 500 MG PO TABS
500.0000 mg | ORAL_TABLET | Freq: Every day | ORAL | Status: DC
Start: 2017-09-09 — End: 2017-09-11
  Administered 2017-09-09 – 2017-09-10 (×2): 500 mg via ORAL
  Filled 2017-09-09 (×2): qty 1

## 2017-09-09 MED ORDER — ALUM & MAG HYDROXIDE-SIMETH 200-200-20 MG/5ML PO SUSP: 15.0000 mL | Freq: Four times a day (QID) | ORAL | Status: DC | PRN

## 2017-09-09 MED ORDER — MOVING RIGHT ALONG BOOK
Freq: Once | Status: DC
  Filled 2017-09-09: qty 1

## 2017-09-09 MED ORDER — MAGNESIUM HYDROXIDE 400 MG/5ML PO SUSP: 30.0000 mL | Freq: Every day | ORAL | Status: DC | PRN

## 2017-09-09 MED ORDER — INSULIN DETEMIR 100 UNIT/ML ~~LOC~~ SOLN
15.0000 [IU] | Freq: Every day | SUBCUTANEOUS | Status: DC
  Administered 2017-09-10 – 2017-09-11 (×2): 15 [IU] via SUBCUTANEOUS
  Filled 2017-09-09 (×2): qty 0.15

## 2017-09-09 MED ORDER — SODIUM CHLORIDE 0.9% FLUSH
3.0000 mL | INTRAVENOUS | Status: DC | PRN
  Administered 2017-09-10: 3 mL via INTRAVENOUS
  Filled 2017-09-09: qty 3

## 2017-09-09 MED ORDER — POTASSIUM CHLORIDE CRYS ER 20 MEQ PO TBCR
20.0000 meq | EXTENDED_RELEASE_TABLET | Freq: Two times a day (BID) | ORAL | Status: DC
  Administered 2017-09-09 – 2017-09-11 (×5): 20 meq via ORAL
  Filled 2017-09-09 (×5): qty 1

## 2017-09-09 MED ORDER — SODIUM CHLORIDE 0.9% FLUSH
3.0000 mL | Freq: Two times a day (BID) | INTRAVENOUS | Status: DC
  Administered 2017-09-09 – 2017-09-10 (×2): 3 mL via INTRAVENOUS

## 2017-09-09 MED ORDER — SODIUM CHLORIDE 0.9 % IV SOLN: 250.0000 mL | INTRAVENOUS | Status: DC | PRN

## 2017-09-09 NOTE — Progress Notes (Signed)
Spoke with RN, aware of order to d/c CVC.

## 2017-09-09 NOTE — Progress Notes (Signed)
3 Days Post-Op Procedure(s) (LRB): CORONARY ARTERY BYPASS GRAFTING (CABG) x four, using left internal mammary artery and right leg greater saphenous vein harvested endoscopically (N/A) TRANSESOPHAGEAL ECHOCARDIOGRAM (TEE) (N/A) Subjective: No complaints this AM  Objective: Vital signs in last 24 hours: Temp:  [98.1 F (36.7 C)-98.5 F (36.9 C)] 98.3 F (36.8 C) (07/28 0700) Pulse Rate:  [29-99] 87 (07/28 0800) Cardiac Rhythm: Normal sinus rhythm (07/28 0800) Resp:  [18-27] 18 (07/28 0800) BP: (104-154)/(48-85) 104/70 (07/28 0800) SpO2:  [83 %-97 %] 97 % (07/28 0800) Weight:  [209 lb 10.5 oz (95.1 kg)] 209 lb 10.5 oz (95.1 kg) (07/28 0500)  Hemodynamic parameters for last 24 hours:    Intake/Output from previous day: 07/27 0701 - 07/28 0700 In: 720 [P.O.:720] Out: 2195 [Urine:2165; Chest Tube:30] Intake/Output this shift: No intake/output data recorded.  General appearance: alert, cooperative and no distress Neurologic: intact Heart: irregularly irregular rhythm Lungs: diminished breath sounds bibasilar Abdomen: normal findings: soft, non-tender Wound: clean and dry  Lab Results: Recent Labs    09/08/17 0352 09/09/17 0607  WBC 8.1 7.2  HGB 9.3* 9.2*  HCT 28.5* 28.7*  PLT 162 165   BMET:  Recent Labs    09/08/17 0352 09/09/17 0607  NA 141 142  K 5.0 3.6  CL 109 108  CO2 25 26  GLUCOSE 131* 144*  BUN 18 19  CREATININE 1.43* 1.37*  CALCIUM 6.5* 8.2*    PT/INR:  Recent Labs    09/06/17 1310  LABPROT 15.7*  INR 1.26   ABG    Component Value Date/Time   PHART 7.339 (L) 09/06/2017 1902   HCO3 23.2 09/06/2017 1902   TCO2 22 09/07/2017 1657   ACIDBASEDEF 3.0 (H) 09/06/2017 1902   O2SAT 99.0 09/06/2017 1902   CBG (last 3)  Recent Labs    09/08/17 2106 09/09/17 0651 09/09/17 0753  GLUCAP 147* 130* 193*    Assessment/Plan: S/P Procedure(s) (LRB): CORONARY ARTERY BYPASS GRAFTING (CABG) x four, using left internal mammary artery and right leg  greater saphenous vein harvested endoscopically (N/A) TRANSESOPHAGEAL ECHOCARDIOGRAM (TEE) (N/A) Plan for transfer to step-down: see transfer orders  CV- SR with PVCs and PACs- will increase metoprolol  RESP- continue IS  RENAL- creatinine slightly better, diuresed well yesterday  PO lasix, supplement K  ENDO- CBG elevated will restart PO meds  Continue cardiac rehab   LOS: 6 days    Melrose Nakayama 09/09/2017

## 2017-09-09 NOTE — Progress Notes (Signed)
Patient received from 2H A&Ox4, VSS. Telemetry applied and CCMD notified. 

## 2017-09-10 ENCOUNTER — Telehealth: Payer: Self-pay | Admitting: Cardiology

## 2017-09-10 LAB — GLUCOSE, CAPILLARY
Glucose-Capillary: 129 mg/dL — ABNORMAL HIGH (ref 70–99)
Glucose-Capillary: 195 mg/dL — ABNORMAL HIGH (ref 70–99)
Glucose-Capillary: 101 mg/dL — ABNORMAL HIGH (ref 70–99)
Glucose-Capillary: 130 mg/dL — ABNORMAL HIGH (ref 70–99)

## 2017-09-10 LAB — BASIC METABOLIC PANEL
Anion gap: 8 (ref 5–15)
BUN: 22 mg/dL (ref 8–23)
CO2: 26 mmol/L (ref 22–32)
Calcium: 8.4 mg/dL — ABNORMAL LOW (ref 8.9–10.3)
Chloride: 108 mmol/L (ref 98–111)
Creatinine, Ser: 1.51 mg/dL — ABNORMAL HIGH (ref 0.61–1.24)
GFR calc Af Amer: 53 mL/min — ABNORMAL LOW (ref >60)
GFR calc non Af Amer: 46 mL/min — ABNORMAL LOW (ref >60)
Glucose, Bld: 139 mg/dL — ABNORMAL HIGH (ref 70–99)
Potassium: 3.8 mmol/L (ref 3.5–5.1)
Sodium: 142 mmol/L (ref 135–145)

## 2017-09-10 LAB — CBC
HCT: 28.5 % — ABNORMAL LOW (ref 39.0–52.0)
Hemoglobin: 9.3 g/dL — ABNORMAL LOW (ref 13.0–17.0)
MCH: 29 pg (ref 26.0–34.0)
MCHC: 32.6 g/dL (ref 30.0–36.0)
MCV: 88.8 fL (ref 78.0–100.0)
Platelets: 203 10*3/uL (ref 150–400)
RBC: 3.21 MIL/uL — ABNORMAL LOW (ref 4.22–5.81)
RDW: 14.1 % (ref 11.5–15.5)
WBC: 6.1 10*3/uL (ref 4.0–10.5)

## 2017-09-10 MED ORDER — LISINOPRIL 5 MG PO TABS
5.0000 mg | ORAL_TABLET | Freq: Every day | ORAL | Status: DC
  Administered 2017-09-10 – 2017-09-11 (×2): 5 mg via ORAL
  Filled 2017-09-10 (×2): qty 1

## 2017-09-10 MED FILL — Potassium Chloride Inj 2 mEq/ML: INTRAVENOUS | Qty: 40 | Status: AC

## 2017-09-10 MED FILL — Magnesium Sulfate Inj 50%: INTRAMUSCULAR | Qty: 10 | Status: AC

## 2017-09-10 MED FILL — Heparin Sodium (Porcine) Inj 1000 Unit/ML: INTRAMUSCULAR | Qty: 30 | Status: AC

## 2017-09-10 NOTE — Telephone Encounter (Signed)
New Message:      Pt states that he just had a triple bypass surgery. Pt states he doesn't believe he need a echo or a monitor done.

## 2017-09-10 NOTE — Care Management Important Message (Signed)
Important Message  Patient Details  Name: Taylor [name] MRN: [id number] Date of Birth: 1950/08/23   Medicare Important Message Given:  Yes    Elgar Scoggins P Tryton Bodi 09/10/2017, 2:30 PM

## 2017-09-10 NOTE — Progress Notes (Signed)
CARDIAC REHAB PHASE I   PRE:  Rate/Rhythm: 84 SR with occ PVC    BP: sitting 158/69    SaO2: 97 RA  MODE:  Ambulation: 660 ft   POST:  Rate/Rhythm: 131 ST with trig PVCs (increased from rest)    BP: sitting 177/88     SaO2: 97 RA   Pt moving well. No assist, steady, quick pace. Pts PVC burden substantially increased with walking at quick pace. He had increased SOB with distance, most likely due to PVCs and increased HR. To recliner. PVCs mostly subsided with rest. Encouraged more walking today, recliner, IS. Gave d/c video to watch. Will ed tomorrow with wife. [place], ACSM 09/10/2017 8:52 AM

## 2017-09-10 NOTE — Telephone Encounter (Signed)
Left message for patient, echo was done in the hospital. Echo and monitor orders canceled.

## 2017-09-10 NOTE — Progress Notes (Signed)
EPW pulled per protocol and as ordered. All ends intact slight resistance met with Rt atrial wires. Patient reminded to lie supine approximately one hour. bp 153/88 Nsr 79 on monitor Will continue to monitor patient. Javeah Loeza, Bettina Gavia RN

## 2017-09-10 NOTE — Progress Notes (Addendum)
      [address]       [place] [address]             [phone]      4 Days Post-Op Procedure(s) (LRB): CORONARY ARTERY BYPASS GRAFTING (CABG) x four, using left internal mammary artery and right leg greater saphenous vein harvested endoscopically (N/A) TRANSESOPHAGEAL ECHOCARDIOGRAM (TEE) (N/A) Subjective: No issues overnight. Pain is well controlled. He is ambulating in the halls. He is asking when he can go home.   Objective: Vital signs in last 24 hours: Temp:  [98.3 F (36.8 C)-99.1 F (37.3 C)] 98.3 F (36.8 C) (07/29 0418) Pulse Rate:  [30-93] 93 (07/28 2117) Cardiac Rhythm: Normal sinus rhythm (07/29 0700) Resp:  [19-25] 25 (07/28 1934) BP: (130-156)/(53-89) 155/71 (07/28 2117) SpO2:  [92 %-96 %] 94 % (07/28 1934) Weight:  [93.9 kg (207 lb)] 93.9 kg (207 lb) (07/29 0418)     Intake/Output from previous day: No intake/output data recorded. Intake/Output this shift: No intake/output data recorded.  General appearance: alert, cooperative and no distress Heart: regular rate and rhythm, S1, S2 normal, no murmur, click, rub or gallop Lungs: clear to auscultation bilaterally Abdomen: soft, non-tender; bowel sounds normal; no masses,  no organomegaly Extremities: extremities normal, atraumatic, no cyanosis or edema Wound: clean and dry  Lab Results: Recent Labs    09/09/17 0607 09/10/17 0334  WBC 7.2 6.1  HGB 9.2* 9.3*  HCT 28.7* 28.5*  PLT 165 203   BMET:  Recent Labs    09/09/17 0607 09/10/17 0334  NA 142 142  K 3.6 3.8  CL 108 108  CO2 26 26  GLUCOSE 144* 139*  BUN 19 22  CREATININE 1.37* 1.51*  CALCIUM 8.2* 8.4*    PT/INR: No results for input(s): LABPROT, INR in the last 72 hours. ABG    Component Value Date/Time   PHART 7.339 (L) 09/06/2017 1902   HCO3 23.2 09/06/2017 1902   TCO2 22 09/07/2017 1657   ACIDBASEDEF 3.0 (H) 09/06/2017 1902   O2SAT 99.0 09/06/2017 1902   CBG (last 3)  Recent Labs    09/09/17 1633  09/09/17 2053 09/10/17 0613  GLUCAP 142* 139* 129*    Assessment/Plan: S/P Procedure(s) (LRB): CORONARY ARTERY BYPASS GRAFTING (CABG) x four, using left internal mammary artery and right leg greater saphenous vein harvested endoscopically (N/A) TRANSESOPHAGEAL ECHOCARDIOGRAM (TEE) (N/A)  1. CV-NSR with occasional PVCs. Okay to discontinue EPW. BP climbing. Will restart low-dose lisinopril since creatinine is at baseline. Continue ASA, statin, and lopressor.  2. Pulm- tolerating room air with good oxygen saturation. Continue incentive spirometer. CXR yesterday showed no pneumothorax, and small left pleural effusion. Continue daily lasix. 3. Renal-creatinine 1.51, electrolytes okay.  4. H and H is 9.3/28.5, platelets 203 5. Endo-blood glucose well controlled on metformin, tradjenta, and insulin  Plan: Remove EPW. Monitor rhythm closely. Work on ambulation. Encouraged incentive spirometer. Start low-dose lisinopril and titrate accordingly. Likely home tomorrow if he continued to progress.     LOS: 7 days    Elgie Collard 09/10/2017 Patient seen and examined, agree with above Creatinine up slightly to where it was a few days ago.- Recheck in [place]. Roxan Hockey, MD [facility] [phone]

## 2017-09-10 NOTE — Progress Notes (Signed)
Progress Note  Patient Name: Taylor [name] Date of Encounter: 09/10/2017  Primary Cardiologist:   [name], MD   Subjective   He feels good and is ready to go home.   Inpatient Medications    Scheduled Meds: . acetaminophen  1,000 mg Oral Q6H  . aspirin EC  325 mg Oral Daily  . atorvastatin  80 mg Oral q1800  . bisacodyl  10 mg Oral Daily   Or  . bisacodyl  10 mg Rectal Daily  . docusate sodium  200 mg Oral Daily  . enoxaparin (LOVENOX) injection  30 mg Subcutaneous QHS  . furosemide  40 mg Oral Daily  . insulin aspart  0-15 Units Subcutaneous TID WC  . insulin aspart  0-5 Units Subcutaneous QHS  . insulin detemir  15 Units Subcutaneous Daily  . metFORMIN  500 mg Oral QHS   And  . linagliptin  5 mg Oral QHS  . lisinopril  5 mg Oral Daily  . mouth rinse  15 mL Mouth Rinse BID  . metoprolol tartrate  25 mg Oral BID  . moving right along book   Does not apply Once  . pantoprazole  40 mg Oral Daily  . potassium chloride  20 mEq Oral BID  . sodium chloride flush  3 mL Intravenous Q12H   Continuous Infusions: . sodium chloride     PRN Meds: sodium chloride, alum & mag hydroxide-simeth, magnesium hydroxide, ondansetron (ZOFRAN) IV, oxyCODONE, sodium chloride flush, traMADol   Vital Signs    Vitals:   09/09/17 1549 09/09/17 1934 09/09/17 2117 09/10/17 0418  BP: (!) 156/89 (!) 154/68 (!) 155/71   Pulse: 90 (!) 37 93   Resp: (!) 25 (!) 25    Temp: 98.3 F (36.8 C) 99.1 F (37.3 C)  98.3 F (36.8 C)  TempSrc: Oral Oral  Oral  SpO2: 95% 94%    Weight:    207 lb (93.9 kg)  Height:       No intake or output data in the 24 hours ending 09/10/17 0850 Filed Weights   09/08/17 0600 09/09/17 0500 09/10/17 0418  Weight: 215 lb 6.2 oz (97.7 kg) 209 lb 10.5 oz (95.1 kg) 207 lb (93.9 kg)    Telemetry    NSR, PVCs - Personally Reviewed  ECG    NA - Personally Reviewed  Physical Exam   GEN: No acute distress.   Neck: No  JVD Cardiac: RRR, no murmurs,  rubs, or gallops.  Respiratory: Clear  to auscultation bilaterally. GI: Soft, nontender, non-distended  MS: No  edema; No deformity. Neuro:  Nonfocal  Psych: Normal affect   Labs    Chemistry Recent Labs  Lab 09/04/17 1117  09/08/17 0352 09/09/17 0607 09/10/17 0334  NA 140   < > 141 142 142  K 4.4   < > 5.0 3.6 3.8  CL 107   < > 109 108 108  CO2 26   < > 25 26 26   GLUCOSE 140*   < > 131* 144* 139*  BUN 22   < > 18 19 22   CREATININE 1.69*   < > 1.43* 1.37* 1.51*  CALCIUM 9.4   < > 6.5* 8.2* 8.4*  PROT 6.9  --   --   --   --   ALBUMIN 3.8  --   --   --   --   AST 16  --   --   --   --   ALT 14  --   --   --   --  ALKPHOS 83  --   --   --   --   BILITOT 1.1  --   --   --   --   GFRNONAA 40*   < > 49* 52* 46*  GFRAA 47*   < > 57* >60 53*  ANIONGAP 7   < > 7 8 8    < > = values in this interval not displayed.     Hematology Recent Labs  Lab 09/08/17 0352 09/09/17 0607 09/10/17 0334  WBC 8.1 7.2 6.1  RBC 3.21* 3.22* 3.21*  HGB 9.3* 9.2* 9.3*  HCT 28.5* 28.7* 28.5*  MCV 88.8 89.1 88.8  MCH 29.0 28.6 29.0  MCHC 32.6 32.1 32.6  RDW 14.0 14.1 14.1  PLT 162 165 203    Cardiac EnzymesNo results for input(s): TROPONINI in the last 168 hours. No results for input(s): TROPIPOC in the last 168 hours.   BNPNo results for input(s): BNP, PROBNP in the last 168 hours.   DDimer No results for input(s): DDIMER in the last 168 hours.   Radiology    Dg Chest Port 1 View  Result Date: 09/09/2017 CLINICAL DATA:  Follow-up atelectasis EXAM: PORTABLE CHEST 1 VIEW COMPARISON:  09/08/2017 FINDINGS: Cardiac shadow remains enlarged. Postsurgical changes are again seen. Right jugular sheath remains in place. Mediastinal drain and left thoracostomy catheter have been removed in the interval. No pneumothorax is seen. Minimal blunting of left costophrenic angle remains with mild atelectatic changes. IMPRESSION: Stable changes in the left base. No pneumothorax following chest tube removal.  Electronically Signed   By: Inez Catalina M.D.   On: 09/09/2017 07:38    Cardiac Studies   TEE  09/06/17  Result status: Final result   Aortic valve: The valve is trileaflet. Mild valve thickening present. No stenosis. No regurgitation.  Mitral valve: Non-specific thickening.  Right ventricle: Normal cavity size, wall thickness and ejection fraction.  Tricuspid valve: Trace regurgitation.  Left atrium: No spontaneous echo contrast.  Septum: No Patent Foramen Ovale present.  Left atrium: Patent foramen ovale not present.  Pulmonic valve: The pulmonic valve appeared structurally normal. The leaflets appear to open normally. There was trace pulmonic insufficiency.     Patient Profile     67 y.o. male found this admission to have 3 vessel CAD and now status post CABG.  Probably home tomorrow.    Assessment & Plan    DM:  Continue current therapy.    CABG:  See below.  PVCs:  Will titrate beta blocker as an outpatient  CARDIOMYOPATHY:  EF is mildly reduced at 40 - 45%.  Will continue to titrate beta blocker and ACE inhibitor as an outpatient.   CKD II:  We will follow labs closely with follow up creat this week.     CARDIOLOGY RECOMMENDATIONS:  Discharge is anticipated in the next 48 hours. Recommendations for medications and follow up:  Discharge Medications: Continue medications as they are currently listed in the [facility]. Exceptions to the above:  No changes.  Will titrate beta blocker and ACE as an outpatient  Follow Up: The patient's Primary Cardiologist is [name], MD   Follow up in the office in 1 week(s).  He has follow up scheduled with Dr. Ellyn Hack on 8/2 and I would like him to keep this.  Message sent to Dr. Ellyn Hack to follow up creat and BP.   Signed,  Minus Breeding, MD  8:51 AM 09/10/2017  [facility]   For questions or updates, please contact [facility] Please consult [email or web address] for contact info under Cardiology/STEMI.     Signed, Minus Breeding, MD  09/10/2017, 8:50 AM

## 2017-09-10 NOTE — Progress Notes (Signed)
Patient ambulated in hallway with family. Yolette Hastings Jessup RN  

## 2017-09-11 LAB — BASIC METABOLIC PANEL
Anion gap: 8 (ref 5–15)
BUN: 21 mg/dL (ref 8–23)
CO2: 28 mmol/L (ref 22–32)
Calcium: 9 mg/dL (ref 8.9–10.3)
Chloride: 109 mmol/L (ref 98–111)
Creatinine, Ser: 1.53 mg/dL — ABNORMAL HIGH (ref 0.61–1.24)
GFR calc Af Amer: 53 mL/min — ABNORMAL LOW (ref >60)
GFR calc non Af Amer: 45 mL/min — ABNORMAL LOW (ref >60)
Glucose, Bld: 141 mg/dL — ABNORMAL HIGH (ref 70–99)
Potassium: 4.6 mmol/L (ref 3.5–5.1)
Sodium: 145 mmol/L (ref 135–145)

## 2017-09-11 LAB — GLUCOSE, CAPILLARY: Glucose-Capillary: 129 mg/dL — ABNORMAL HIGH (ref 70–99)

## 2017-09-11 MED ORDER — ACETAMINOPHEN 500 MG PO TABS: 1000.0000 mg | ORAL_TABLET | Freq: Four times a day (QID) | ORAL | 0 refills | Status: DC

## 2017-09-11 MED ORDER — ASPIRIN 325 MG PO TBEC: 325.0000 mg | DELAYED_RELEASE_TABLET | Freq: Every day | ORAL | 0 refills | Status: DC

## 2017-09-11 MED ORDER — ATORVASTATIN CALCIUM 80 MG PO TABS: 80.0000 mg | ORAL_TABLET | Freq: Every day | ORAL | 1 refills | Status: DC

## 2017-09-11 MED ORDER — OXYCODONE HCL 5 MG PO TABS: 5.0000 mg | ORAL_TABLET | Freq: Four times a day (QID) | ORAL | 0 refills | Status: DC | PRN

## 2017-09-11 MED ORDER — METOPROLOL TARTRATE 25 MG PO TABS: 25.0000 mg | ORAL_TABLET | Freq: Two times a day (BID) | ORAL | 1 refills | Status: DC

## 2017-09-11 MED ORDER — LISINOPRIL 5 MG PO TABS: 5.0000 mg | ORAL_TABLET | Freq: Every day | ORAL | 1 refills | Status: DC

## 2017-09-11 NOTE — Progress Notes (Signed)
09/11/2017 12:19 PM Discharge AVS meds taken today and those due this evening reviewed.  Follow-up appointments and when to call md reviewed.  D/C IV and TELE.  Questions and concerns addressed.   D/C home per orders. Carney Corners

## 2017-09-11 NOTE — Progress Notes (Signed)
      [address]       [place] [address]             [phone]      5 Days Post-Op Procedure(s) (LRB): CORONARY ARTERY BYPASS GRAFTING (CABG) x four, using left internal mammary artery and right leg greater saphenous vein harvested endoscopically (N/A) TRANSESOPHAGEAL ECHOCARDIOGRAM (TEE) (N/A) Subjective: Feels good this morning. No issues yesterday.   Objective: Vital signs in last 24 hours: Temp:  [98.5 F (36.9 C)-98.6 F (37 C)] 98.5 F (36.9 C) (07/30 0636) Pulse Rate:  [47-106] 83 (07/30 0636) Cardiac Rhythm: Normal sinus rhythm (07/29 2120) Resp:  [18-25] 20 (07/30 0636) BP: (134-177)/(57-93) 136/93 (07/30 0636) SpO2:  [94 %-97 %] 97 % (07/30 0636) Weight:  [91.9 kg (202 lb 9.6 oz)] 91.9 kg (202 lb 9.6 oz) (07/30 0636)     Intake/Output from previous day: 07/29 0701 - 07/30 0700 In: 600 [P.O.:600] Out: -  Intake/Output this shift: No intake/output data recorded.  General appearance: alert, cooperative and no distress Heart: regular rate and rhythm, S1, S2 normal, no murmur, click, rub or gallop Lungs: clear to auscultation bilaterally Abdomen: soft, non-tender; bowel sounds normal; no masses,  no organomegaly Extremities: extremities normal, atraumatic, no cyanosis or edema Wound: clean and dry  Lab Results: Recent Labs    09/09/17 0607 09/10/17 0334  WBC 7.2 6.1  HGB 9.2* 9.3*  HCT 28.7* 28.5*  PLT 165 203   BMET:  Recent Labs    09/10/17 0334 09/11/17 0359  NA 142 145  K 3.8 4.6  CL 108 109  CO2 26 28  GLUCOSE 139* 141*  BUN 22 21  CREATININE 1.51* 1.53*  CALCIUM 8.4* 9.0    PT/INR: No results for input(s): LABPROT, INR in the last 72 hours. ABG    Component Value Date/Time   PHART 7.339 (L) 09/06/2017 1902   HCO3 23.2 09/06/2017 1902   TCO2 22 09/07/2017 1657   ACIDBASEDEF 3.0 (H) 09/06/2017 1902   O2SAT 99.0 09/06/2017 1902   CBG (last 3)  Recent Labs    09/10/17 1610 09/10/17 2124 09/11/17 0618    GLUCAP 101* 130* 129*    Assessment/Plan: S/P Procedure(s) (LRB): CORONARY ARTERY BYPASS GRAFTING (CABG) x four, using left internal mammary artery and right leg greater saphenous vein harvested endoscopically (N/A) TRANSESOPHAGEAL ECHOCARDIOGRAM (TEE) (N/A)  1. CV-NSR 80s-90s with PVCs.  Discontinued EPW. BP better today. Continue low-dose lisinopril since creatinine is at baseline. Continue ASA, statin, and lopressor.  2. Pulm- tolerating room air with good oxygen saturation. Continue incentive spirometer. CXR 7/28 showed no pneumothorax, and small left pleural effusion.  3. Renal-creatinine 1.53, electrolytes okay. Weight is down to baseline, will discontinue lasix on discharge 4. H and H is 9.3/28.5, platelets 203 5. Endo-blood glucose well controlled on metformin, tradjenta, and insulin  Plan: discharge later this morning. Cardiology provided recs and plans to follow creatinine outpatient.    LOS: 8 days    Elgie Collard 09/11/2017

## 2017-09-11 NOTE — Progress Notes (Signed)
09/11/2017 1000 Chest tube sutures D/C'd.  t tolerated well. Carney Corners

## 2017-09-11 NOTE — Progress Notes (Signed)
Ed completed with pt and wife. Good reception although sounds like he needs to watch his diet/DM more. Will refer to [facility].  [id number] Yves Dill CES, ACSM 10:06 AM 09/11/2017

## 2017-09-12 ENCOUNTER — Telehealth (HOSPITAL_COMMUNITY): Payer: Self-pay

## 2017-09-12 NOTE — Telephone Encounter (Signed)
Pt insurance is active and benefits verified through [employer]. Co-pay $10.00, DED $0.00/$0.00 met, out of pocket $3,100.00/$160.00 met, co-insurance 0%. No pre-authorization. Shawnda/[employer], 09/12/17 @ 3:22PM, SMO#[id number]  Will contact patient to see if he is interested in the Cardiac Rehab Program. If interested, patient will need to complete follow up appt. Once completed, patient will be contacted for scheduling upon review by the RN Navigator.

## 2017-09-12 NOTE — Telephone Encounter (Signed)
Called patient to see if he is interested in the Cardiac Rehab Program. Patient expressed interest. Explained scheduling process and went over insurance, patient verbalized understanding. Will contact patient for scheduling once f/u has been completed.  °

## 2017-09-14 ENCOUNTER — Ambulatory Visit (INDEPENDENT_AMBULATORY_CARE_PROVIDER_SITE_OTHER): Payer: PPO | Admitting: Cardiology

## 2017-09-14 ENCOUNTER — Telehealth: Payer: Self-pay

## 2017-09-14 ENCOUNTER — Encounter: Payer: Self-pay | Admitting: Cardiology

## 2017-09-14 VITALS — BP 128/65 | HR 71 | Ht 71.0 in | Wt 203.8 lb

## 2017-09-14 DIAGNOSIS — I255 Ischemic cardiomyopathy: Secondary | ICD-10-CM | POA: Diagnosis not present

## 2017-09-14 DIAGNOSIS — Z951 Presence of aortocoronary bypass graft: Secondary | ICD-10-CM | POA: Diagnosis not present

## 2017-09-14 DIAGNOSIS — E785 Hyperlipidemia, unspecified: Secondary | ICD-10-CM

## 2017-09-14 DIAGNOSIS — I10 Essential (primary) hypertension: Secondary | ICD-10-CM | POA: Diagnosis not present

## 2017-09-14 DIAGNOSIS — I25119 Atherosclerotic heart disease of native coronary artery with unspecified angina pectoris: Secondary | ICD-10-CM | POA: Diagnosis not present

## 2017-09-14 DIAGNOSIS — I498 Other specified cardiac arrhythmias: Secondary | ICD-10-CM

## 2017-09-14 DIAGNOSIS — I499 Cardiac arrhythmia, unspecified: Secondary | ICD-10-CM | POA: Diagnosis not present

## 2017-09-14 DIAGNOSIS — E1169 Type 2 diabetes mellitus with other specified complication: Secondary | ICD-10-CM | POA: Diagnosis not present

## 2017-09-14 DIAGNOSIS — I493 Ventricular premature depolarization: Secondary | ICD-10-CM | POA: Diagnosis not present

## 2017-09-14 MED ORDER — METOPROLOL SUCCINATE ER 50 MG PO TB24: 50.0000 mg | ORAL_TABLET | Freq: Every day | ORAL | 3 refills | Status: DC

## 2017-09-14 NOTE — Patient Instructions (Signed)
Your physician has recommended you make the following change in your medication:  -- STOP metoprolol tartrate when you finish current bottle -- START metoprolol succinate 50mg  daily  Your physician has requested that you have an echocardiogram in 3 months @ [address]. Echocardiography is a painless test that uses sound waves to create images of your heart. It provides your doctor with information about the size and shape of your heart and how well your heart's chambers and valves are working. This procedure takes approximately one hour. There are no restrictions for this procedure.  Your physician recommends that you schedule a follow-up appointment in: 3-4 months with Dr. Ellyn Hack - after echo

## 2017-09-14 NOTE — Telephone Encounter (Signed)
Taylor [name] called and stated that he had a small amount of drainage coming from his sternal incision.  He was discharged from the hospital s/p CABG x 1 week.  He stated that he first noticed it last night and placed a gauze bandage over it.  He said that since this morning he has not had anymore drainage.  The color was blood-tinged.  There was no odor or smell, and the incision looked good.  Pink, no redness.  He verbalized no fever.  I asked if he had any trauma to the area, and he stated no.  I advised him to keep watching it over the weekend, did not sound like the area was infected.  I advised if he had any changes in the site he was to call the office.  I also told him to give the office a call on Monday to give [place] a update.  He acknowledged receipt.  Will continue to monitor.

## 2017-09-14 NOTE — Progress Notes (Signed)
PCP: Haywood Pao, MD  Clinic Note: Chief Complaint  Patient presents with  . Hospitalization Follow-up    POST-CABG  . Coronary Artery Disease    HPI: Taylor [name] is a 67 y.o. male with a PMH below who presents today for POST CABG follow-up. Taylor [name] is a 67 y.o. male who was initially seen on 08/29/17 for the evaluation of Bradycardia with long-lasting Ventricular Bigeminy at the request of Tisovec, Fransico Him, MD.   He has a family history concerning for father had an MI and died at age 42. Had 1 paternal uncle who died of CAD in his 69s and a second paternal uncle who also had CAD. His mother had A. fib.  Taylor [name] last seen for initial consultation & noted an episode of prolonged exertional SSCP in addition to fatigue. He had actually just passed several kidney stones & EKG noted Ventricular Bigeminy.  On further questioning, he may have "been slowing down" of late, but until the one episode of CP, he had not had any angina type symptoms. -- He was evaluated with a Myoview ST that was read as HIGH risk by EKG & with Anterior Ischemia, so he was seen on the day of his stress test by Dr. Harrell Gave (at my request) & was referred for Cardiac Catheterization.  Studies Personally Reviewed - (if available, images/films reviewed: From Epic Chart or Care Everywhere)  MYOVIEW 08/31/17: HIGH RISK - with  in tmedium sized, moderate severity Reversible perfusion e apdefect in  the mid and apical anterior, apical lateral walls andhe truex consistent with ischemia in the LAD territory,  however given significant arrhythmias, severe ST depressions and TID (transient ischemic dilatation) a multivessel disease is highly likely (SDS =7). FINDINGS ARE C/W ISCHEMIA.  Blood pressure demonstrated a normal response to exercise.  Horizontal ST segment depression ST segment depression of 3 mm was noted during stress in the II, III, V5 and V6 leads, beginning at 2 minutes of stress, and  returning to baseline after 5-9 minutes of recovery.   Stress ECG showed significant arrhythmias including frequent PVCs in a pattern of bigeminy and one run of nsVT consisting of three beats.  The study was not gated therefore LVEF and wall motion can't be evaluated.   THIS IS A HIGH RISK STUDY WITH MEDIUM SIZE, MODERATE SEVERITY REVERSIBLE DEFECT    Cardiac Catheterization 09/03/17: mRCA 70%, dRCA 99%-95% into RPAV & 90% ostRPDA 95%. RPL1 70%, distal RVAM (tandem PDA) - 85%. Ost-prox LAD 40% -pLAD 60&90% @ D1 ->p-mLAD 95% @ D2 with 70% D2. pCx 80%, p-mCx 90%-> mCx 85%.  EF 45-50%, global HK. -->> referred for CABG    Transthoracic Echo 09/04/2017 (pre-CABG): EF 40-45%, diffuse HK.  Difficult to assess due to frequent PVCs. Severe LA dilation. Mildly increased PAP.  CABG 7/25 (Dr. Roxy Manns): LIMA-LAD, SVG-OM2, SeqSVG-rPDA-RPL2  Intra-OP TEE: Normal AoV, MV & TV.  Normal RV.  Recent Hospitalizations:   Hospitalized following Cath on 7/22 - CABG 7/25 & d/c on 7/30.  Interval History:  Taylor [name] presents today for his first post hospital follow-up after Recent CABG. His postop course in the hospital was unremarkable, and he was discharged on postop day 5.  He has been doing relatively well at home since discharge, and has started back walking up to about 1/4 mile twice daily.  He states that his energy level has actually improved.  He says that  he has not had any resting or exertional chest pain  or pressure.  He has not had any dyspnea.  He just has expected soreness. He denies any PND, orthopnea or edema.  He really did not have much in the way of postop edema.  He has not had any rapid irregular heartbeats or palpitations.  No dizziness, weakness or syncope/near syncope. No TIA/amaurosis fugax symptoms.  No claudication.  He is extremely thankful that he had his condition diagnosed and treated before having any untoward event.  Although his EF is reduced, he certainly does not have any CHF  symptoms.   He has noted some drainage from his chest tube sites and tenderness center of his sternotomy scar.   there has not been any pus or any tissue redness.   He denies any fevers or chills.  ROS: A comprehensive was performed. Review of Systems  Constitutional: Negative for malaise/fatigue ( gradually regaining energy).  HENT: Negative for congestion and nosebleeds.   Respiratory: Negative for cough, shortness of breath and wheezing.   Gastrointestinal: Negative for blood in stool and melena.  Genitourinary: Negative for hematuria.  Musculoskeletal: Negative for joint pain.       Expected chest wall discomfort  Neurological: Positive for dizziness (Some positional).  Psychiatric/Behavioral: Negative.   All other systems reviewed and are negative.    I have reviewed and (if needed) personally updated the patient's problem list, medications, allergies, past medical and surgical history, social and family history.   Past Medical History:  Diagnosis Date  . Bigeminal rhythm 08/01/2017   per EKG 07-24-2017 in epic (per confirmation new since last ecg in 2007)  in care everywhere in epic ecg dated 2012 shows sinus brady rate 53 no pvc's --per pt asymptomatic and   . Bradycardia   . Coronary artery disease involving native coronary artery 08/2017   Abnormal Myoview 08/2017 (Abnormal EKG - Severe ST depressions, PVCs & nsVT). TID noted.  Moderate LAD territory Ischemic Perfusion defect --> Cath with extensive RCA (RPDA & RPAV), Cx & LAD disease --> CABG x 4  . Family history of premature coronary artery disease    Father - MI 62. Paternal uncle x 2 -1 with MI & other with CABG in 62s  . History of contusion    ED visit 08-04-2013 , 9 days past injury ,  dx concussion w/ LOC, per CT inferior frontal lobes contusion hemarrhage-- residual headache (08-01-2017  per pt residual resolved)  . History of kidney stones   . Hypercholesterolemia   . Hypertension   . Nephrolithiasis     bilateral nonobstructive per CT 07-24-2017  . S/P CABG x 4 09/06/2017   LIMA to LAD, SVG to OM2, Sequential SVG to PDA and RPL2, EVH via right thigh and leg  . Type 2 diabetes mellitus ([place])    followed by pcp  . Ureteral calculi    left  . Wears glasses   . Wears hearing aid in both ears     Past Surgical History:  Procedure Laterality Date  . CORONARY ARTERY BYPASS GRAFT N/A 09/06/2017   Procedure: CORONARY ARTERY BYPASS GRAFTING (CABG) x 4, using left internal mammary artery and right leg greater saphenous vein harvested endoscopically;  Surgeon: Rexene Alberts, MD;  Location[facility];  Service: Open Heart Surgery;; LIMA-LAD, SVG-OM2, SeqSVG-RPDA-RPL2  . CYSTOSCOPY/RETROGRADE/URETEROSCOPY Left 08/06/2017   Procedure: CYSTOSCOPY/RETROGRADE/URETEROSCOPY laser and stone basket extrection;  Surgeon: Cleon Gustin, MD;  Location: [facility];  Service: Urology;  Laterality: Left;  . LAPAROSCOPIC APPENDECTOMY  11-18-2005  dr wyatt  [facility]  . LEFT HEART CATH AND CORONARY ANGIOGRAPHY N/A 09/03/2017   Procedure: LEFT HEART CATH AND CORONARY ANGIOGRAPHY;  Surgeon: Leonie Man, MD;  Location: [facility];  Service: Cardiovascular;; mRCA 70%, dRCA 99%-95% into RPAV & 90% ostRPDA 95%. RPL1 70%, distal RVAM (tandem PDA) - 85%. Ost-prox LAD 40% -pLAD 60&90% @ D1 ->p-mLAD 95% @ D2 with 70% D2. pCx 80%, p-mCx 90%-> mCx 85%.  EF 45-50%, global HK. -->> referred for CABG  . NM MYOVIEW LTD  08/31/2017   HIGH RISK: Combination Imaging & EKG:  with  in tmedium sized, moderate severity Reversible perfusion e apdefect in  the mid and apical anterior, apical lateral walls - FINDINGS ARE C/W ISCHEMIA in the LAD territory,  however given significant arrhythmias, severe ST depressions and TID =  MV CAD is highly likely (SDS =7).   . ORCHIECTOMY  1965   unilateral -- per pt ruptured  . TEE WITHOUT CARDIOVERSION N/A 09/06/2017   Procedure: TRANSESOPHAGEAL ECHOCARDIOGRAM (TEE);  INTRA-OP.   Surgeon: Rexene Alberts, MD;  Location: [facility] OR;  Service: Open Heart Surgery:;; Normal AoV, MV & TV.  Normal RV.  Marland Kitchen TRANSTHORACIC ECHOCARDIOGRAM  09/04/2017   (pre-CABG): EF 40-45%, diffuse HK.  Difficult to assess due to frequent PVCs. Severe LA dilation. Mildly increased PAP.  Marland Kitchen UMBILICAL HERNIA REPAIR  2006  . VARICOSE VEIN SURGERY  2007   left endovascular laser ablation    Current Meds  Medication Sig  . aspirin EC 325 MG EC tablet Take 1 tablet (325 mg total) by mouth daily.  Marland Kitchen atorvastatin (LIPITOR) 80 MG tablet Take 1 tablet (80 mg total) by mouth daily at 6 PM.  . lisinopril (PRINIVIL,ZESTRIL) 5 MG tablet Take 1 tablet (5 mg total) by mouth daily.  . Multiple Vitamin (MULTIVITAMIN) tablet Take 1 tablet by mouth daily.  . sitaGLIPtan-metformin (JANUMET) 50-500 MG per tablet Take 1 tablet by mouth at bedtime.   . [DISCONTINUED] acetaminophen (TYLENOL) 500 MG tablet Take 2 tablets (1,000 mg total) by mouth every 6 (six) hours.  . [DISCONTINUED] metoprolol tartrate (LOPRESSOR) 25 MG tablet Take 1 tablet (25 mg total) by mouth 2 (two) times daily.  . [DISCONTINUED] oxyCODONE (OXY IR/ROXICODONE) 5 MG immediate release tablet Take 1 tablet (5 mg total) by mouth every 6 (six) hours as needed for severe pain.    No Known Allergies  Social History   Tobacco Use  . Smoking status: Never Smoker  . Smokeless tobacco: Never Used  Substance Use Topics  . Alcohol use: No  . Drug use: No   Social History   Social History Narrative   married and lives locally in [place] with his family.     He has 2 adult children and several grandchildren.     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    Family History family history includes Atrial fibrillation in his mother; CAD in his paternal uncle; Clotting disorder in his father; Diabetes in his daughter; Heart attack in his paternal uncle; Heart attack (age of onset: 57) in his father;  Heart disease in his other; Stroke in his mother and other.  Wt Readings from Last 3 Encounters:  09/14/17 203 lb 12.8 oz (92.4 kg)  09/11/17 202 lb 9.6 oz (91.9 kg)  08/31/17 209 lb (94.8 kg)    PHYSICAL EXAM BP 128/65   Pulse 71   Ht 5\' 11"  (1.803 m)   Wt  203 lb 12.8 oz (92.4 kg)   BMI 28.42 kg/m  Physical Exam  Constitutional: He is oriented to person, place, and time. He appears well-developed and well-nourished. No distress.  Very healthy-appearing.  Well-groomed.  Does not look like he is one week postop  HENT:  Head: Normocephalic and atraumatic.  Mouth/Throat: No oropharyngeal exudate.  Neck: Normal range of motion. Neck supple. No hepatojugular reflux and no JVD present. Carotid bruit is not present.  Cardiovascular: Normal rate, regular rhythm, normal heart sounds and intact distal pulses. Frequent extrasystoles are present. PMI is not displaced. Exam reveals no gallop and no friction rub.  No murmur heard. Pulmonary/Chest: Effort normal and breath sounds normal. No respiratory distress. He has no wheezes. He has no rales. He exhibits tenderness (Normal postop).  Central & Left chest tube site with gauze dressing mildly saturated with serosanguinous fluid - no redness or warmth. There is a similar dressing at the middle of the Sternotomy scar - also with no warmth.  Abdominal: Soft. Bowel sounds are normal. He exhibits no distension. There is no tenderness. There is no rebound.  No HSM  Musculoskeletal: Normal range of motion. He exhibits no edema.  Neurological: He is alert and oriented to person, place, and time.  Psychiatric: He has a normal mood and affect. His behavior is normal. Judgment and thought content normal.    Adult ECG Report  Rate: 71 ;  Rhythm: normal sinus rhythm and left atrial enlargement.  Lateral TWI - cannot exclude ischemia.;   Narrative Interpretation: relatively normal EKG.   Other studies Reviewed: Additional studies/ records that were  reviewed today include:  Recent Labs:   Lab Results  Component Value Date   CREATININE 1.53 (H) 09/11/2017   BUN 21 09/11/2017   NA 145 09/11/2017   K 4.6 09/11/2017   CL 109 09/11/2017   CO2 28 09/11/2017   No results found for: CHOL, HDL, LDLCALC, LDLDIRECT, TRIG, CHOLHDL CBC Latest Ref Rng & Units 09/10/2017 09/09/2017 09/08/2017  WBC 4.0 - 10.5 K/uL 6.1 7.2 8.1  Hemoglobin 13.0 - 17.0 g/dL 9.3(L) 9.2(L) 9.3(L)  Hematocrit 39.0 - 52.0 % 28.5(L) 28.7(L) 28.5(L)  Platelets 150 - 400 K/uL 203 165 162    ASSESSMENT / PLAN: Problem List Items Addressed This Visit    Coronary artery disease involving native coronary artery of native heart with angina pectoris (HCC) (Chronic)    He only had one anginal episode that I can tell, he has not had any now.  Multivessel disease on For CABG. He is now on low-dose lisinopril along with Lopressor which I am converting to Toprol.  Currently on HIGH dose ASA - anticipate reducing to 81 mg aft iswer CVTS follow-up.  High dose statin -- adjust based upon follow-up lipids.      Relevant Medications   metoprolol succinate (TOPROL-XL) 50 MG 24 hr tablet   Essential hypertension (Chronic)    BP well within normal limits on current dose of Lisinopril & Lopressor. We will see what his BP response to conversion to Toprol will be.  Anticipate further titration of Lisinopril.      Relevant Medications   metoprolol succinate (TOPROL-XL) 50 MG 24 hr tablet   Frequent unifocal PVCs (Chronic)    Less notable.  Convert from Lopressor to Toprol. I suspect that a lot of this was ischemic mediated.        Relevant Medications   metoprolol succinate (TOPROL-XL) 50 MG 24 hr tablet   Hyperlipidemia associated with type  2 diabetes mellitus (HCC) (Chronic)    Most recent LDL reading was 82 pre-CABG. Now on 80 mg atorvastatin -- will need f/u labs in ~3-4 months. Anticipate reducing dose in LDL down to target (optimal < 50).      Relevant Medications    metoprolol succinate (TOPROL-XL) 50 MG 24 hr tablet   Ischemic cardiomyopathy - pre-CABG ECHO: 40-45% (Chronic)    Reduced LVEF on Echo pre-CABG, but no overt CHF symptoms. Would hope that his EF will improve with revascularization.  On ACE-I & Beta Blocker -- convert from Lopressor to Toprol.   Anticipate titration of ACE-I up to 10 mg Lisinopril.  Currently euvolemic --> no diuretic requirement.  FOLLOW-UP ECHO IN ~3 MONTHS TO REASSESS POST-REVASCULARIZATION      Relevant Medications   metoprolol succinate (TOPROL-XL) 50 MG 24 hr tablet   Other Relevant Orders   ECHOCARDIOGRAM COMPLETE   S/P CABG x 4 - Primary   Relevant Orders   EKG 12-Lead   ECHOCARDIOGRAM COMPLETE   Ventricular bigeminy (Chronic)    He has PVCs on exam and on EKG/telemetry.  But did not have bigeminy today.    Is now on Lopressor 25 mg twice daily.  We will convert to Toprol 50 mg daily end of this current bottle of medications.      Relevant Medications   metoprolol succinate (TOPROL-XL) 50 MG 24 hr tablet     RECOMMEND THAT HE CALLS CVTS OFFICE TO LET THEM KNOWN OF WOUND "OOZING".  I spent a total of 40 minutes with the patient and chart review. >  50% of the time was spent in direct patient consultation.   Current medicines are reviewed at length with the patient today.  (+/- concerns) N/A The following changes have been made:  N/A  Patient Instructions  Your physician has recommended you make the following change in your medication:  -- STOP metoprolol tartrate when you finish current bottle -- START metoprolol succinate 50mg  daily  Your physician has requested that you have an echocardiogram in 3 months @ [address]. Echocardiography is a painless test that uses sound waves to create images of your heart. It provides your doctor with information about the size and shape of your heart and how well your heart's chambers and valves are working. This procedure takes  approximately one hour. There are no restrictions for this procedure.  Your physician recommends that you schedule a follow-up appointment in: 3-4 months with Dr. Ellyn Hack - after echo   Studies Ordered:   Orders Placed This Encounter  Procedures  . EKG 12-Lead  . ECHOCARDIOGRAM COMPLETE      [name], M.D., M.S. Interventional Cardiologist   Pager # [phone] Phone # [phone] [address]. [place], [place] [address]   Thank you for choosing [facility] at [facility]!!

## 2017-09-16 ENCOUNTER — Encounter: Payer: Self-pay | Admitting: Cardiology

## 2017-09-16 NOTE — Assessment & Plan Note (Signed)
BP well within normal limits on current dose of Lisinopril & Lopressor. We will see what his BP response to conversion to Toprol will be.  Anticipate further titration of Lisinopril.

## 2017-09-16 NOTE — Assessment & Plan Note (Signed)
Less notable.  Convert from Lopressor to Toprol. I suspect that a lot of this was ischemic mediated.

## 2017-09-16 NOTE — Assessment & Plan Note (Signed)
He has PVCs on exam and on EKG/telemetry.  But did not have bigeminy today.    Is now on Lopressor 25 mg twice daily.  We will convert to Toprol 50 mg daily end of this current bottle of medications.

## 2017-09-16 NOTE — Assessment & Plan Note (Signed)
Most recent LDL reading was 82 pre-CABG. Now on 80 mg atorvastatin -- will need f/u labs in ~3-4 months. Anticipate reducing dose in LDL down to target (optimal < 50).

## 2017-09-16 NOTE — Assessment & Plan Note (Addendum)
He only had one anginal episode that I can tell, he has not had any now.  Multivessel disease on For CABG. He is now on low-dose lisinopril along with Lopressor which I am converting to Toprol.  Currently on HIGH dose ASA - anticipate reducing to 81 mg aft iswer CVTS follow-up.  High dose statin -- adjust based upon follow-up lipids.

## 2017-09-16 NOTE — Assessment & Plan Note (Addendum)
Reduced LVEF on Echo pre-CABG, but no overt CHF symptoms. Would hope that his EF will improve with revascularization.  On ACE-I & Beta Blocker -- convert from Lopressor to Toprol.   Anticipate titration of ACE-I up to 10 mg Lisinopril.  Currently euvolemic --> no diuretic requirement.  FOLLOW-UP ECHO IN ~3 MONTHS TO REASSESS POST-REVASCULARIZATION

## 2017-09-17 ENCOUNTER — Telehealth: Payer: Self-pay

## 2017-09-17 ENCOUNTER — Telehealth (HOSPITAL_COMMUNITY): Payer: Self-pay

## 2017-09-17 NOTE — Telephone Encounter (Signed)
Mr. Taylor [name] contacted the office and left a voicemail message stating that his incision looked great this morning/ over the weekend.  He called last week concerned about drainage coming from his sternal incision.  He is s/p CABG x4 with Dr. Roxy Manns on 09/06/17.  He stated that it looked great no drainage, swelling, or redness.  Will continue to monitor.

## 2017-09-17 NOTE — Telephone Encounter (Signed)
Called to schedule orientation for patient on 10/25/17 at 8:00am. Patient will attend the 8:15am exc class. Mailed packet.

## 2017-09-18 DIAGNOSIS — E1129 Type 2 diabetes mellitus with other diabetic kidney complication: Secondary | ICD-10-CM | POA: Diagnosis not present

## 2017-09-18 DIAGNOSIS — E7800 Pure hypercholesterolemia, unspecified: Secondary | ICD-10-CM | POA: Diagnosis not present

## 2017-09-18 DIAGNOSIS — E1165 Type 2 diabetes mellitus with hyperglycemia: Secondary | ICD-10-CM | POA: Diagnosis not present

## 2017-09-18 DIAGNOSIS — N183 Chronic kidney disease, stage 3 (moderate): Secondary | ICD-10-CM | POA: Diagnosis not present

## 2017-09-18 DIAGNOSIS — I10 Essential (primary) hypertension: Secondary | ICD-10-CM | POA: Diagnosis not present

## 2017-09-18 DIAGNOSIS — R808 Other proteinuria: Secondary | ICD-10-CM | POA: Diagnosis not present

## 2017-09-18 DIAGNOSIS — E668 Other obesity: Secondary | ICD-10-CM | POA: Diagnosis not present

## 2017-09-18 DIAGNOSIS — Z6828 Body mass index (BMI) 28.0-28.9, adult: Secondary | ICD-10-CM | POA: Diagnosis not present

## 2017-09-18 DIAGNOSIS — I25810 Atherosclerosis of coronary artery bypass graft(s) without angina pectoris: Secondary | ICD-10-CM | POA: Diagnosis not present

## 2017-10-03 ENCOUNTER — Other Ambulatory Visit: Payer: Self-pay | Admitting: Physician Assistant

## 2017-10-04 ENCOUNTER — Other Ambulatory Visit: Payer: Self-pay | Admitting: Thoracic Surgery (Cardiothoracic Vascular Surgery)

## 2017-10-04 DIAGNOSIS — N201 Calculus of ureter: Secondary | ICD-10-CM | POA: Diagnosis not present

## 2017-10-04 DIAGNOSIS — Z951 Presence of aortocoronary bypass graft: Secondary | ICD-10-CM

## 2017-10-08 ENCOUNTER — Ambulatory Visit (INDEPENDENT_AMBULATORY_CARE_PROVIDER_SITE_OTHER): Payer: Self-pay | Admitting: Physician Assistant

## 2017-10-08 ENCOUNTER — Ambulatory Visit
Admission: RE | Admit: 2017-10-08 | Discharge: 2017-10-08 | Disposition: A | Discharge status: Home / Self Care | Payer: PPO | Source: Ambulatory Visit | Attending: Thoracic Surgery (Cardiothoracic Vascular Surgery) | Admitting: Thoracic Surgery (Cardiothoracic Vascular Surgery)

## 2017-10-08 VITALS — BP 150/64 | HR 36 | Resp 20 | Ht 71.0 in | Wt 203.0 lb

## 2017-10-08 DIAGNOSIS — I25119 Atherosclerotic heart disease of native coronary artery with unspecified angina pectoris: Secondary | ICD-10-CM

## 2017-10-08 DIAGNOSIS — Z951 Presence of aortocoronary bypass graft: Secondary | ICD-10-CM

## 2017-10-08 MED ORDER — ASPIRIN 325 MG PO TBEC: 325.0000 mg | DELAYED_RELEASE_TABLET | Freq: Every day | ORAL | 1 refills | Status: DC

## 2017-10-08 MED ORDER — LISINOPRIL 10 MG PO TABS: 10.0000 mg | ORAL_TABLET | Freq: Every day | ORAL | 1 refills | Status: DC

## 2017-10-08 NOTE — Patient Instructions (Addendum)
Make every effort to keep your diabetes under very tight control.  Follow up closely with your primary care physician or endocrinologist and strive to keep their hemoglobin A1c levels as low as possible, preferably near or below 6.0.  The long term benefits of strict control of diabetes are far reaching and critically important for your overall health and survival.  You may continue to gradually increase your physical activity as tolerated.  Refrain from any heavy lifting or strenuous use of your arms and shoulders until at least 8 weeks from the time of your surgery, and avoid activities that cause increased pain in your chest on the side of your surgical incision.  Otherwise you may continue to increase activities without any particular limitations.  Increase the intensity and duration of physical activity gradually.

## 2017-10-08 NOTE — Progress Notes (Signed)
  HPI:  Patient returns for routine postoperative follow-up having undergone a CABG x 4 on 09/06/2017 by Dr. Roxy Manns. Since hospital discharge the patient reports he is walking about 1/2 mile per day. He denies shortness of breath, dizziness, or chest pain.   Current Outpatient Medications  Medication Sig Dispense Refill  . aspirin EC 325 MG EC tablet Take 1 tablet (325 mg total) by mouth daily. 30 tablet 0  . atorvastatin (LIPITOR) 80 MG tablet Take 1 tablet (80 mg total) by mouth daily at 6 PM. 30 tablet 1  . lisinopril (PRINIVIL,ZESTRIL) 5 MG tablet Take 1 tablet (5 mg total) by mouth daily. 30 tablet 1  . metoprolol succinate (TOPROL-XL) 50 MG 24 hr tablet Take 1 tablet (50 mg total) by mouth daily. Take with or immediately following a meal. 90 tablet 3  . Multiple Vitamin (MULTIVITAMIN) tablet Take 1 tablet by mouth daily.    . sitaGLIPtan-metformin (JANUMET) 50-500 MG per tablet Take 1 tablet by mouth at bedtime.     Vital Signs:BP 150/64 HR 36, RR 20 Oxygenation 97% on room air.   Physical Exam: CV-Bradycardic Pulmonary-Clear to auscultation bilaterally Extremities-No Le edema Wounds-Clean and dry. Eschar removed from mid proximal sternal wound. Eschar distal right EVH wound. No sign of infection.  Diagnostic Tests: CLINICAL DATA:  S/P CABG 09/06/17, CAD, HTN, DM, there are no current chest complaints (patient going to tcts now)  EXAM: CHEST - 2 VIEW  COMPARISON:  09/09/2017  FINDINGS: Lungs are clear. Improved aeration of the left lung base since prior study.  Heart size and mediastinal contours are within normal limits. Previous CABG. Aortic Atherosclerosis (ICD10-170.0).  No effusion.  Previous median sternotomy.  Visualized bones unremarkable.  IMPRESSION: No acute cardiopulmonary disease post CABG.   Electronically Signed   By: Lucrezia Europe M.D.   On: 10/08/2017 14:08  Impression and Plan: Overall, Mr. Lingard is recovering well from coronary artery  bypass grafting surgery. He has already seen Dr. Ellyn Hack in follow up on 09/16/2017. Metoprolol was going to be changed to Toprol XL 50 mg daily. I spoke with Dr.Harding as his heart rate is in the 30's;although, he asymptomatic. As discussed with Dr. Ellyn Hack, Metoprolol is to be stopped and Lisinopril will be increased to 10 mg daily. Patient will have follow up echo in the fall. He was instructed he may NOT drive until bradycardia has been evaluated and cleared by cardiology.  He is to continue with sternal precautions (I.e. No lifting more than 10 pounds for the next 3-4 weeks). He was encouraged to participate in cardiac rehab, which he is agreeable but again, he is not to participate until seen and cleared by cardiology (bradycardia). I will contact cardiology for an appointment within the next week, hopefully. He will return to see Dr. Roxy Manns in about 4 weeks.    Nani Skillern, PA-C [facility] [phone]

## 2017-10-10 ENCOUNTER — Encounter: Payer: Self-pay | Admitting: Physician Assistant

## 2017-10-10 ENCOUNTER — Ambulatory Visit: Payer: PPO | Admitting: Physician Assistant

## 2017-10-10 VITALS — BP 142/78 | HR 69 | Ht 71.0 in | Wt 202.8 lb

## 2017-10-10 DIAGNOSIS — I498 Other specified cardiac arrhythmias: Secondary | ICD-10-CM

## 2017-10-10 DIAGNOSIS — I499 Cardiac arrhythmia, unspecified: Secondary | ICD-10-CM

## 2017-10-10 DIAGNOSIS — I25810 Atherosclerosis of coronary artery bypass graft(s) without angina pectoris: Secondary | ICD-10-CM

## 2017-10-10 DIAGNOSIS — R001 Bradycardia, unspecified: Secondary | ICD-10-CM

## 2017-10-10 DIAGNOSIS — E785 Hyperlipidemia, unspecified: Secondary | ICD-10-CM

## 2017-10-10 DIAGNOSIS — I10 Essential (primary) hypertension: Secondary | ICD-10-CM | POA: Diagnosis not present

## 2017-10-10 MED ORDER — METOPROLOL SUCCINATE ER 25 MG PO TB24: 25.0000 mg | ORAL_TABLET | Freq: Every day | ORAL | 3 refills | Status: DC

## 2017-10-10 NOTE — Patient Instructions (Addendum)
Medication Instructions:  Your physician has recommended you make the following change in your medication:  START Metoprolol Succinate 25mg  daily. An Rx has been sent to you pharmacy   Labwork: None ordered  Testing/Procedures: Your physician has recommended that you wear an event monitor. Event monitors are medical devices that record the heart's electrical activity. Doctors most often [place] these monitors to diagnose arrhythmias. Arrhythmias are problems with the speed or rhythm of the heartbeat. The monitor is a small, portable device. You can wear one while you do your normal daily activities. This is usually used to diagnose what is causing palpitations/syncope (passing out).  Echocardiogram already scheduled  Follow-Up: Follow up as planned with Dr. Ellyn Hack  Any Other Special Instructions Will Be Listed Below (If Applicable).     If you need a refill on your cardiac medications before your next appointment, please call your pharmacy.

## 2017-10-10 NOTE — Progress Notes (Signed)
Cardiology Office Note    Date:  10/12/2017   ID:  Taylor [name], DOB 1950-12-04, MRN [id number]  PCP:  Haywood Pao, MD  Cardiologist:  Dr. Ellyn Hack   Chief Complaint  Patient presents with  . Follow-up    seen for Dr. Ellyn Hack.     History of Present Illness:  Taylor [name] is a 67 y.o. male with PMH of hypertension, hyperlipidemia, history of bradycardia, ventricular bigeminy and CAD s/p CABG.  Patient was originally referred to cardiology service in July 2019 for evaluation of bradycardia and ventricular bigeminy.  He has a strong family history of CAD.  Due to episodes of prolonged exertional chest pain, he was evaluated with a Myoview which came back high risk concerning for anterior ischemia.  He eventually underwent cardiac catheterization on 09/01/2017 which showed multivessel disease.  Echocardiogram obtained on 09/04/2017 showed EF 40 to 45%, diffuse hypokinesis, severe LAE.  He was referred for CABG and underwent LIMA to LAD, SVG to OM 2, sequential SVG to RPDA and RPL2 by Dr. Roxy Manns on 09/06/2017.  Postop, patient was seen by Dr. Ellyn Hack on 09/14/2017, he was recovering slowly and begin to build up exercise tolerance.  His Lopressor was switched to Toprol-XL.  Patient presents back to cardiology office for follow-up.  He was recently seen by cardiothoracic surgery and noted to have a heart rate of 36. No EKG was obtained per patient.  Interestingly he was completely asymptomatic that day without any dizziness, blurred vision or feeling of passing out.  His Toprol-XL 50 mg daily was discontinued and lisinopril was increased to 10 mg daily.  Given the asymptomatic nature, my suspicion is he was in ventricular bigeminy at the time therefore was unable to pick up every other heartbeat, thus causing his heart rate to be falsely low.  I recommend to reinitiate his Toprol-XL at 25 mg daily for now.  I will obtain a 30-day event monitor to see if he does have any recurrence of prolonged  bigeminy.  I am hesitant to increase lisinopril at this time.  He is due for repeat echocardiogram in November to evaluate his ejection fraction before follow-up with Dr. Ellyn Hack.  Otherwise he does not have any lower extremity edema, orthopnea or PND.  He is walking up to half a mile per day.  He is okay to resume cardiac rehab at this time.    Past Medical History:  Diagnosis Date  . Bigeminal rhythm 08/01/2017   per EKG 07-24-2017 in epic (per confirmation new since last ecg in 2007)  in care everywhere in epic ecg dated 2012 shows sinus brady rate 53 no pvc's --per pt asymptomatic and   . Bradycardia   . Coronary artery disease involving native coronary artery 08/2017   Abnormal Myoview 08/2017 (Abnormal EKG - Severe ST depressions, PVCs & nsVT). TID noted.  Moderate LAD territory Ischemic Perfusion defect --> Cath with extensive RCA (RPDA & RPAV), Cx & LAD disease --> CABG x 4  . Family history of premature coronary artery disease    Father - MI 58. Paternal uncle x 2 -1 with MI & other with CABG in 11s  . History of contusion    ED visit 08-04-2013 , 9 days past injury ,  dx concussion w/ LOC, per CT inferior frontal lobes contusion hemarrhage-- residual headache (08-01-2017  per pt residual resolved)  . History of kidney stones   . Hypercholesterolemia   . Hypertension   . Nephrolithiasis  bilateral nonobstructive per CT 07-24-2017  . S/P CABG x 4 09/06/2017   LIMA to LAD, SVG to OM2, Sequential SVG to PDA and RPL2, EVH via right thigh and leg  . Type 2 diabetes mellitus ([place])    followed by pcp  . Ureteral calculi    left  . Wears glasses   . Wears hearing aid in both ears     Past Surgical History:  Procedure Laterality Date  . CORONARY ARTERY BYPASS GRAFT N/A 09/06/2017   Procedure: CORONARY ARTERY BYPASS GRAFTING (CABG) x 4, using left internal mammary artery and right leg greater saphenous vein harvested endoscopically;  Surgeon: Rexene Alberts, MD;  Location: [facility] OR;   Service: Open Heart Surgery;; LIMA-LAD, SVG-OM2, SeqSVG-RPDA-RPL2  . CYSTOSCOPY/RETROGRADE/URETEROSCOPY Left 08/06/2017   Procedure: CYSTOSCOPY/RETROGRADE/URETEROSCOPY laser and stone basket extrection;  Surgeon: Cleon Gustin, MD;  Location: [facility];  Service: Urology;  Laterality: Left;  . LAPAROSCOPIC APPENDECTOMY  11-18-2005    dr wyatt  [facility]  . LEFT HEART CATH AND CORONARY ANGIOGRAPHY N/A 09/03/2017   Procedure: LEFT HEART CATH AND CORONARY ANGIOGRAPHY;  Surgeon: Leonie Man, MD;  Location: [facility];  Service: Cardiovascular;; mRCA 70%, dRCA 99%-95% into RPAV & 90% ostRPDA 95%. RPL1 70%, distal RVAM (tandem PDA) - 85%. Ost-prox LAD 40% -pLAD 60&90% @ D1 ->p-mLAD 95% @ D2 with 70% D2. pCx 80%, p-mCx 90%-> mCx 85%.  EF 45-50%, global HK. -->> referred for CABG  . NM MYOVIEW LTD  08/31/2017   HIGH RISK: Combination Imaging & EKG:  with  in tmedium sized, moderate severity Reversible perfusion e apdefect in  the mid and apical anterior, apical lateral walls - FINDINGS ARE C/W ISCHEMIA in the LAD territory,  however given significant arrhythmias, severe ST depressions and TID =  MV CAD is highly likely (SDS =7).   . ORCHIECTOMY  1965   unilateral -- per pt ruptured  . TEE WITHOUT CARDIOVERSION N/A 09/06/2017   Procedure: TRANSESOPHAGEAL ECHOCARDIOGRAM (TEE);  INTRA-OP.  Surgeon: Rexene Alberts, MD;  Location: [facility] OR;  Service: Open Heart Surgery:;; Normal AoV, MV & TV.  Normal RV.  Marland Kitchen TRANSTHORACIC ECHOCARDIOGRAM  09/04/2017   (pre-CABG): EF 40-45%, diffuse HK.  Difficult to assess due to frequent PVCs. Severe LA dilation. Mildly increased PAP.  Marland Kitchen UMBILICAL HERNIA REPAIR  2006  . VARICOSE VEIN SURGERY  2007   left endovascular laser ablation    Current Medications: Outpatient Medications Prior to Visit  Medication Sig Dispense Refill  . aspirin 325 MG EC tablet Take 1 tablet (325 mg total) by mouth daily. 30 tablet 1  . atorvastatin (LIPITOR) 80 MG tablet  Take 1 tablet (80 mg total) by mouth daily at 6 PM. 30 tablet 1  . lisinopril (PRINIVIL,ZESTRIL) 10 MG tablet Take 1 tablet (10 mg total) by mouth daily. 30 tablet 1  . Multiple Vitamin (MULTIVITAMIN) tablet Take 1 tablet by mouth daily.    . sitaGLIPtan-metformin (JANUMET) 50-500 MG per tablet Take 1 tablet by mouth 2 (two) times daily with a meal.      No facility-administered medications prior to visit.      Allergies:   Patient has no known allergies.   Social History   Socioeconomic History  . Marital status: Married    Spouse name: Not on file  . Number of children: Not on file  . Years of education: Not on file  . Highest education level: Not on file  Occupational History  .  Not on file  Social Needs  . Financial resource strain: Not on file  . Food insecurity:    Worry: Not on file    Inability: Not on file  . Transportation needs:    Medical: Not on file    Non-medical: Not on file  Tobacco Use  . Smoking status: Never Smoker  . Smokeless tobacco: Never Used  Substance and Sexual Activity  . Alcohol use: No  . Drug use: No  . Sexual activity: Yes  Lifestyle  . Physical activity:    Days per week: Not on file    Minutes per session: Not on file  . Stress: Not on file  Relationships  . Social connections:    Talks on phone: Not on file    Gets together: Not on file    Attends religious service: Not on file    Active member of club or organization: Not on file    Attends meetings of clubs or organizations: Not on file    Relationship status: Not on file  Other Topics Concern  . Not on file  Social History Narrative   married and lives locally in [place] with his family.     He has 2 adult children and several grandchildren.     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      Family History:  The patient's family history includes Atrial fibrillation in his mother; CAD in his paternal uncle; Clotting  disorder in his father; Diabetes in his daughter; Heart attack in his paternal uncle; Heart attack (age of onset: 35) in his father; Heart disease in his other; Stroke in his mother and other.   ROS:   Please see the history of present illness.    ROS All other systems reviewed and are negative.   PHYSICAL EXAM:   VS:  BP (!) 142/78   Pulse 69   Ht 5\' 11"  (1.803 m)   Wt 202 lb 12.8 oz (92 kg)   BMI 28.28 kg/m    GEN: Well nourished, well developed, in no acute distress  HEENT: normal  Neck: no JVD, carotid bruits, or masses Cardiac: RRR; no murmurs, rubs, or gallops,no edema  Respiratory:  clear to auscultation bilaterally, normal work of breathing GI: soft, nontender, nondistended, + BS MS: no deformity or atrophy  Skin: warm and dry, no rash Neuro:  Alert and Oriented x 3, Strength and sensation are intact Psych: euthymic mood, full affect  Wt Readings from Last 3 Encounters:  10/10/17 202 lb 12.8 oz (92 kg)  10/08/17 203 lb (92.1 kg)  09/14/17 203 lb 12.8 oz (92.4 kg)      Studies/Labs Reviewed:   EKG:  EKG is ordered today.  The ekg ordered today demonstrates normal sinus rhythm, T wave inversion in inferolateral leads  Recent Labs: 09/04/2017: ALT 14 09/07/2017: Magnesium 2.2 09/10/2017: Hemoglobin 9.3; Platelets 203 09/11/2017: BUN 21; Creatinine, Ser 1.53; Potassium 4.6; Sodium 145   Lipid Panel No results found for: CHOL, TRIG, HDL, CHOLHDL, VLDL, LDLCALC, LDLDIRECT  Additional studies/ records that were reviewed today include:   Echo 09/04/2017 LV EF: 40% -   45% Study Conclusions  - Left ventricle: The cavity size was moderately dilated. Systolic   function was mildly to moderately reduced. The estimated ejection   fraction was in the range of 40% to 45%. Diffuse hypokinesis. The   study is not technically sufficient to allow evaluation of LV   diastolic  function. - Aortic valve: Transvalvular velocity was within the normal range.   There was no  stenosis. There was no regurgitation. - Mitral valve: Transvalvular velocity was within the normal range.   There was no evidence for stenosis. There was trivial   regurgitation. - Left atrium: The atrium was severely dilated. - Right ventricle: The cavity size was normal. Wall thickness was   normal. Systolic function was normal. - Atrial septum: No defect or patent foramen ovale was identified   by color flow Doppler. - Tricuspid valve: There was mild regurgitation. - Pulmonary arteries: Systolic pressure was mildly increased. PA   peak pressure: 44 mm Hg (S).  Impressions:  - Frequent PVCs may lead to underestimation of left ventricular   systolic function.    ASSESSMENT:    1. Bradycardia   2. Essential hypertension   3. Hyperlipidemia, unspecified hyperlipidemia type   4. Ventricular bigeminy   5. Coronary artery disease involving coronary bypass graft of native heart without angina pectoris      PLAN:  In order of problems listed above:  1. Bradycardia: Heart rate was in the 30s recently during his cardiothoracic surgery office visit.  According to patient, no EKG was obtained.  Interestingly, he was completely asymptomatic at the time.  My suspicion is he was in ventricular bigeminy again.  Due to concern of true bradycardia, he is 50 mg Toprol-XL has been discontinued.  However I would recommend restarting Toprol-XL at 25 mg daily.  I would recommend obtaining a 30-day event monitor to look for significant ventricular ectopy  2. Hypertension: Blood pressure mildly elevated, add Toprol-XL 25 mg daily  3. Ischemic cardia myopathy: EF 40 to 45% prior to bypass surgery.  Continue lisinopril, add back Toprol-XL.  Repeat echocardiogram in 2 months before his next visit with Dr. Ellyn Hack  4. CAD s/p CABG: Denies significant chest pain.  Continue 325 mg aspirin  5. Hyperlipidemia: Continue Lipitor 80 mg daily.  Need to repeat fasting lipid panel and LFT annually, will defer  to primary care provider    Medication Adjustments/Labs and Tests Ordered: Current medicines are reviewed at length with the patient today.  Concerns regarding medicines are outlined above.  Medication changes, Labs and Tests ordered today are listed in the Patient Instructions below. Patient Instructions  Medication Instructions:  Your physician has recommended you make the following change in your medication:  START Metoprolol Succinate 25mg  daily. An Rx has been sent to you pharmacy   Labwork: None ordered  Testing/Procedures: Your physician has recommended that you wear an event monitor. Event monitors are medical devices that record the heart's electrical activity. Doctors most often [place] these monitors to diagnose arrhythmias. Arrhythmias are problems with the speed or rhythm of the heartbeat. The monitor is a small, portable device. You can wear one while you do your normal daily activities. This is usually used to diagnose what is causing palpitations/syncope (passing out).  Echocardiogram already scheduled  Follow-Up: Follow up as planned with Dr. Ellyn Hack  Any Other Special Instructions Will Be Listed Below (If Applicable).     If you need a refill on your cardiac medications before your next appointment, please call your pharmacy.      Hilbert Corrigan, [place]  10/12/2017 11:28 PM    [address], [address] [place] [address], [place]  [address] Phone: [phone]; Fax: [phone]

## 2017-10-12 ENCOUNTER — Encounter: Payer: Self-pay | Admitting: Physician Assistant

## 2017-10-12 ENCOUNTER — Ambulatory Visit (INDEPENDENT_AMBULATORY_CARE_PROVIDER_SITE_OTHER): Payer: PPO

## 2017-10-12 DIAGNOSIS — R001 Bradycardia, unspecified: Secondary | ICD-10-CM

## 2017-10-17 ENCOUNTER — Telehealth (HOSPITAL_COMMUNITY): Payer: Self-pay

## 2017-10-19 ENCOUNTER — Telehealth: Payer: Self-pay | Admitting: Cardiology

## 2017-10-19 NOTE — Telephone Encounter (Signed)
New Message: ° ° ° °Patient is returning a call °

## 2017-10-19 NOTE — Telephone Encounter (Signed)
LMTCB concerning [employer] monitor report received from 10/18/17 @ 19:57 EDT. HR 159, new onset AF. Reviewed by DOD Dr. Sallyanne Kuster who reports monitor appears to be new onset AF and patient needs appointment with MD or APP.   Scheduled patient for MD OV on 9/9 @ 2:40pm

## 2017-10-19 NOTE — Telephone Encounter (Signed)
Patient returned call. Spoke with him about what he was doing yesterday around 8pm when monitor report triggered new onset AF. He was not at home but something sort of agitated him - he had a car pull out in front of him while trying to leave gas station. He felt his pulse speed up. He reports the episodes lasted maybe 2 minutes max. He feels normal now. Notified patient that his monitor was reviewed by DOD and appears to be AF. Notified patient he will need to be seen and he has been scheduled 9/9 @ 2:40pm with Dr. Ellyn Hack. He will be able to come to this appointment.

## 2017-10-19 NOTE — Telephone Encounter (Signed)
Addressed in another telephone encounter.

## 2017-10-22 ENCOUNTER — Ambulatory Visit: Payer: PPO | Admitting: Cardiology

## 2017-10-22 ENCOUNTER — Encounter: Payer: Self-pay | Admitting: Cardiology

## 2017-10-22 VITALS — BP 134/78 | HR 56 | Ht 71.0 in | Wt 204.6 lb

## 2017-10-22 DIAGNOSIS — I498 Other specified cardiac arrhythmias: Secondary | ICD-10-CM

## 2017-10-22 DIAGNOSIS — I10 Essential (primary) hypertension: Secondary | ICD-10-CM

## 2017-10-22 DIAGNOSIS — E785 Hyperlipidemia, unspecified: Secondary | ICD-10-CM | POA: Diagnosis not present

## 2017-10-22 DIAGNOSIS — I4891 Unspecified atrial fibrillation: Secondary | ICD-10-CM

## 2017-10-22 DIAGNOSIS — I2510 Atherosclerotic heart disease of native coronary artery without angina pectoris: Secondary | ICD-10-CM | POA: Diagnosis not present

## 2017-10-22 DIAGNOSIS — E1169 Type 2 diabetes mellitus with other specified complication: Secondary | ICD-10-CM | POA: Diagnosis not present

## 2017-10-22 DIAGNOSIS — I255 Ischemic cardiomyopathy: Secondary | ICD-10-CM

## 2017-10-22 DIAGNOSIS — I499 Cardiac arrhythmia, unspecified: Secondary | ICD-10-CM | POA: Diagnosis not present

## 2017-10-22 DIAGNOSIS — I480 Paroxysmal atrial fibrillation: Secondary | ICD-10-CM | POA: Insufficient documentation

## 2017-10-22 MED ORDER — RIVAROXABAN 20 MG PO TABS: 20.0000 mg | ORAL_TABLET | Freq: Every day | ORAL | 6 refills | Status: DC

## 2017-10-22 MED ORDER — METOPROLOL TARTRATE 25 MG PO TABS: 25.0000 mg | ORAL_TABLET | ORAL | 6 refills | Status: DC | PRN

## 2017-10-22 NOTE — Telephone Encounter (Signed)
Cardiac Rehab Medication Review by a Pharmacist  Does the patient  feel that his/her medications are working for him/her?  yes  Has the patient been experiencing any side effects to the medications prescribed?  no  Does the patient measure his/her own blood pressure or blood glucose at home?  yes , occasionally   Does the patient have any problems obtaining medications due to transportation or finances?   no  Understanding of regimen: good Understanding of indications: good Potential of compliance: good    Pharmacist comments: Patient has a good understanding of his medication regimen. He does not routinely monitor his blood pressure and blood glucose.    Harrietta Guardian, PharmD PGY1 Pharmacy Resident 10/22/2017    1:31 PM

## 2017-10-22 NOTE — Patient Instructions (Addendum)
MEDICATION CHANGES  START XARELTO 20 MG  ONE TABLET  AT DINNER TIME ( BIGGEST MEAL OF THE DAY.   MAY TAKE METOPROLOL TARTRATE 25 MG  FOR PROLONG INCREASE HEART RATE  OR PALPATIONS    KEEP SCHEDULE APPOINTMENT TO HAVE ECHO DONE IN NOV 2019.    Your physician recommends that you schedule a follow-up appointment in NOV 2019 [place].   If you need a refill on your cardiac medications before your next appointment, please call your pharmacy.

## 2017-10-22 NOTE — Progress Notes (Signed)
PCP: Haywood Pao, MD  Clinic Note: Chief Complaint  Patient presents with  . Follow-up    Monitor results  . Atrial Fibrillation    New diagnosis on monitor  . Coronary Artery Disease    Recent CABG  . Cardiomyopathy    Ischemic, no heart failure    HPI: Taylor [name] is a 67 y.o. male with a PMH of recently diagnosed multivessel CAD-CABG who presents today for heart monitor follow-up with an episode of atrial fibrillation.  Taylor [name] is a 67 y.o. male who was initially seen on 08/29/17 for the evaluation of Bradycardia with long-lasting Ventricular Bigeminy at the request of Tisovec, Fransico Him, MD.    He has a family history concerning for father had an MI and died at age 1. Had 1 paternal uncle who died of CAD in his 31s and a second paternal uncle who also had CAD. His mother had A. fib. -Because of at least one episode of substernal chest pressure and fatigue along with frequent PVCs and bigeminy pattern, he was referred for stress test read as high risk with anterior ischemia that led to cardiac catheterization noted below.  This identified multivessel disease and he was referred for CABG.  Taylor [name] was seen back on August 2 and was doing well post CABG.  I try to titrate up his beta-blocker, however he was then seen a couple weeks later in surgery clinic and was noted to be bradycardic with rates in the 30s.  He was then seen urgently by Almyra Deforest, PA on 10/10/2017.  It was felt that he actually may have been having ventricular bigeminy.  He was placed on a monitor and beta-blocker was restarted at original dose.  Of note, he was a symptom medic of all this.  Studies Personally Reviewed - (if available, images/films reviewed: From Epic Chart or Care Everywhere)  Currently has monitor in place, however there was a interim finding on MONITOR- interim incident report -- Afib RVR 159 bpm.  R .ecent Hospitalizations:   Hospitalized following Cath on 7/22 - CABG  7/25 & d/c on 7/30.  Interval History:  Taylor [name] presents today for work in follow-up to discuss the finding of A. fib on monitor.  He actually notes on last Thursday that he was sitting in his car after getting gas and felt his heart rate all of a sudden go fast but it only lasted a few minutes but he definitely felt a going fast.  He has not felt like anything similar to this before.  He denies any chest tightness pressure associated with it.  He also has not really had any palpitations since then.  No angina with rest or exertion.  Back walking routinely.  He walks about a half a mile a day in the morning without difficulty and is doing other exercises well.  Is hoping to get back to routine activity soon.  Chest soreness seems to have improved.  No CHF symptoms of PND, orthopnea or edema.  No syncope/near syncope or TIA/amaurosis fugax. No melena, hematochezia or hematuria.  ROS: A comprehensive was performed. Review of Systems  Constitutional: Negative for malaise/fatigue ( gradually regaining energy).  HENT: Negative for congestion and nosebleeds.   Respiratory: Negative for cough, shortness of breath and wheezing.   Cardiovascular: Positive for palpitations (Per HPI).  Gastrointestinal: Negative for blood in stool and melena.  Genitourinary: Negative for hematuria.  Musculoskeletal: Negative for joint pain.  Expected chest wall discomfort  Neurological: Positive for dizziness (Improving).  Psychiatric/Behavioral: Negative.  The patient is not nervous/anxious.   All other systems reviewed and are negative.  I have reviewed and (if needed) personally updated the patient's problem list, medications, allergies, past medical and surgical history, social and family history.   Past Medical History:  Diagnosis Date  . Bigeminal rhythm 08/01/2017   per EKG 07-24-2017 in epic (per confirmation new since last ecg in 2007)  in care everywhere in epic ecg dated 2012 shows sinus brady rate 53  no pvc's --per pt asymptomatic and   . Bradycardia   . Coronary artery disease involving native coronary artery 08/2017   Abnormal Myoview 08/2017 (Abnormal EKG - Severe ST depressions, PVCs & nsVT). TID noted.  Moderate LAD territory Ischemic Perfusion defect --> Cath with extensive RCA (RPDA & RPAV), Cx & LAD disease --> CABG x 4  . Family history of premature coronary artery disease    Father - MI 22. Paternal uncle x 2 -1 with MI & other with CABG in 32s  . History of contusion    ED visit 08-04-2013 , 9 days past injury ,  dx concussion w/ LOC, per CT inferior frontal lobes contusion hemarrhage-- residual headache (08-01-2017  per pt residual resolved)  . History of kidney stones   . Hypercholesterolemia   . Hypertension   . Nephrolithiasis    bilateral nonobstructive per CT 07-24-2017  . S/P CABG x 4 09/06/2017   LIMA to LAD, SVG to OM2, Sequential SVG to PDA and RPL2, EVH via right thigh and leg  . Type 2 diabetes mellitus ([place])    followed by pcp  . Ureteral calculi    left  . Wears glasses   . Wears hearing aid in both ears     Past Surgical History:  Procedure Laterality Date  . CORONARY ARTERY BYPASS GRAFT N/A 09/06/2017   Procedure: CORONARY ARTERY BYPASS GRAFTING (CABG) x 4, using left internal mammary artery and right leg greater saphenous vein harvested endoscopically;  Surgeon: Rexene Alberts, MD;  Location: [facility] OR;  Service: Open Heart Surgery;; LIMA-LAD, SVG-OM2, SeqSVG-RPDA-RPL2  . CYSTOSCOPY/RETROGRADE/URETEROSCOPY Left 08/06/2017   Procedure: CYSTOSCOPY/RETROGRADE/URETEROSCOPY laser and stone basket extrection;  Surgeon: Cleon Gustin, MD;  Location: [facility];  Service: Urology;  Laterality: Left;  . LAPAROSCOPIC APPENDECTOMY  11-18-2005    dr wyatt  [facility]  . LEFT HEART CATH AND CORONARY ANGIOGRAPHY N/A 09/03/2017   Procedure: LEFT HEART CATH AND CORONARY ANGIOGRAPHY;  Surgeon: Leonie Man, MD;  Location: [facility];  Service:  Cardiovascular;; mRCA 70%, dRCA 99%-95% into RPAV & 90% ostRPDA 95%. RPL1 70%, distal RVAM (tandem PDA) - 85%. Ost-prox LAD 40% -pLAD 60&90% @ D1 ->p-mLAD 95% @ D2 with 70% D2. pCx 80%, p-mCx 90%-> mCx 85%.  EF 45-50%, global HK. -->> referred for CABG  . NM MYOVIEW LTD  08/31/2017   HIGH RISK: Combination Imaging & EKG:  with  in tmedium sized, moderate severity Reversible perfusion e apdefect in  the mid and apical anterior, apical lateral walls - FINDINGS ARE C/W ISCHEMIA in the LAD territory,  however given significant arrhythmias, severe ST depressions and TID =  MV CAD is highly likely (SDS =7).   . ORCHIECTOMY  1965   unilateral -- per pt ruptured  . TEE WITHOUT CARDIOVERSION N/A 09/06/2017   Procedure: TRANSESOPHAGEAL ECHOCARDIOGRAM (TEE);  INTRA-OP.  Surgeon: Rexene Alberts, MD;  Location: [facility];  Service:  Open Heart Surgery:;; Normal AoV, MV & TV.  Normal RV.  Marland Kitchen TRANSTHORACIC ECHOCARDIOGRAM  09/04/2017   (pre-CABG): EF 40-45%, diffuse HK.  Difficult to assess due to frequent PVCs. Severe LA dilation. Mildly increased PAP.  Marland Kitchen UMBILICAL HERNIA REPAIR  2006  . VARICOSE VEIN SURGERY  2007   left endovascular laser ablation    Cardiac Catheterization 09/03/17: mRCA 70%, dRCA 99%-95% into RPAV & 90% ostRPDA 95%. RPL1 70%, distal RVAM (tandem PDA) - 85%. Ost-prox LAD 40% -pLAD 60&90% @ D1 ->p-mLAD 95% @ D2 with 70% D2. pCx 80%, p-mCx 90%-> mCx 85%.  EF 45-50%, global HK. -->> referred for CABG 7/25 (Dr. Roxy Manns): LIMA-LAD, SVG-OM2, SeqSVG-rPDA-RPL2    Current Meds  Medication Sig  . aspirin 325 MG EC tablet Take 1 tablet (325 mg total) by mouth daily.  Marland Kitchen atorvastatin (LIPITOR) 80 MG tablet Take 1 tablet (80 mg total) by mouth daily at 6 PM.  . lisinopril (PRINIVIL,ZESTRIL) 10 MG tablet Take 1 tablet (10 mg total) by mouth daily.  . metoprolol succinate (TOPROL XL) 25 MG 24 hr tablet Take 1 tablet (25 mg total) by mouth daily.  . Multiple Vitamin (MULTIVITAMIN) tablet Take 1 tablet by mouth  daily.  . sitaGLIPtan-metformin (JANUMET) 50-500 MG per tablet Take 1 tablet by mouth 2 (two) times daily with a meal.     No Known Allergies  Social History   Tobacco Use  . Smoking status: Never Smoker  . Smokeless tobacco: Never Used  Substance Use Topics  . Alcohol use: No  . Drug use: No   Social History   Social History Narrative   married and lives locally in [place] with his family.     He has 2 adult children and several grandchildren.     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    Family History family history includes Atrial fibrillation in his mother; CAD in his paternal uncle; Clotting disorder in his father; Diabetes in his daughter; Heart attack in his paternal uncle; Heart attack (age of onset: 69) in his father; Heart disease in his other; Stroke in his mother and other.  Wt Readings from Last 3 Encounters:  10/22/17 204 lb 9.6 oz (92.8 kg)  10/10/17 202 lb 12.8 oz (92 kg)  10/08/17 203 lb (92.1 kg)    PHYSICAL EXAM BP 134/78   Pulse (!) 56   Ht 5\' 11"  (1.803 m)   Wt 204 lb 9.6 oz (92.8 kg)   BMI 28.54 kg/m  Physical Exam  Constitutional: He is oriented to person, place, and time. He appears well-developed and well-nourished. No distress.  Healthy-appearing.  Well-groomed.  HENT:  Head: Normocephalic and atraumatic.  Neck: Normal range of motion. Neck supple. No hepatojugular reflux and no JVD present. Carotid bruit is not present.  Cardiovascular: Normal rate, regular rhythm, normal heart sounds and intact distal pulses. Frequent extrasystoles are present. PMI is not displaced. Exam reveals no gallop and no friction rub.  No murmur heard. Pulmonary/Chest: Effort normal and breath sounds normal. No respiratory distress. He has no wheezes. He has no rales. He exhibits no tenderness (Normal postop).  Central & Left chest tube site with gauze dressing mildly saturated with serosanguinous fluid - no  redness or warmth. There is a similar dressing at the middle of the Sternotomy scar - also with no warmth.  Abdominal: Soft. Bowel sounds are normal. He exhibits no distension. There is no tenderness. There is no rebound.  No HSM  Musculoskeletal: Normal range of motion. He exhibits no edema.  Neurological: He is alert and oriented to person, place, and time.  Psychiatric: He has a normal mood and affect. His behavior is normal. Judgment and thought content normal.  Vitals reviewed.   Adult ECG Report  Rate: 56 ;  Rhythm: sinus bradycardia, premature ventricular contractions (PVC) and left ventricular enlargement: Lateral TWI - cannot exclude ischemia.;   Narrative Interpretation: otherwise normal EKG.   Other studies Reviewed: Additional studies/ records that were reviewed today include:  Recent Labs:   Lab Results  Component Value Date   CREATININE 1.53 (H) 09/11/2017   BUN 21 09/11/2017   NA 145 09/11/2017   K 4.6 09/11/2017   CL 109 09/11/2017   CO2 28 09/11/2017   No results found for: CHOL, HDL, LDLCALC, LDLDIRECT, TRIG, CHOLHDL CBC Latest Ref Rng & Units 09/10/2017 09/09/2017 09/08/2017  WBC 4.0 - 10.5 K/uL 6.1 7.2 8.1  Hemoglobin 13.0 - 17.0 g/dL 9.3(L) 9.2(L) 9.3(L)  Hematocrit 39.0 - 52.0 % 28.5(L) 28.7(L) 28.5(L)  Platelets 150 - 400 K/uL 203 165 162     ASSESSMENT / PLAN: Problem List Items Addressed This Visit    Coronary artery disease involving native coronary artery of native heart without angina pectoris (Chronic)    No further anginal episodes post CABG.  Multivessel disease noted and now doing well post CABG with exception of a short run of A. fib on monitor.  On aspirin which we are going to reduce to 81 mg and stop shortly thereafter because of Xarelto. On beta-blocker, ACE inhibitor and statin. No nitroglycerin requirement.      Relevant Medications   rivaroxaban (XARELTO) 20 MG TABS tablet   metoprolol tartrate (LOPRESSOR) 25 MG tablet   Essential  hypertension (Chronic)   Relevant Medications   rivaroxaban (XARELTO) 20 MG TABS tablet   metoprolol tartrate (LOPRESSOR) 25 MG tablet   Hyperlipidemia associated with type 2 diabetes mellitus (HCC) (Chronic)    Most recent LAD pre-CABG was 82.  Is on high-dose atorvastatin.  Will need to recheck in roughly November timeframe.  Target LDL now is less than 70 closer to 50. No myalgias.      Relevant Medications   rivaroxaban (XARELTO) 20 MG TABS tablet   metoprolol tartrate (LOPRESSOR) 25 MG tablet   Ischemic cardiomyopathy - pre-CABG ECHO: 40-45% (Chronic)    Reduced EF, hopefully will improve post CABG.  We can recheck an echocardiogram 3 months post CABG. On ACE inhibitor and and now low-dose beta-blocker.  Since we had reduced his beta-blocker dose we may build titrate up back up the ACE inhibitor, but for now we will continue current dosing. He is euvolemic with no heart failure symptoms.  We therefore not require diuretic.      Relevant Medications   rivaroxaban (XARELTO) 20 MG TABS tablet   metoprolol tartrate (LOPRESSOR) 25 MG tablet   New onset atrial fibrillation (HCC) - Primary (Chronic)    So far only one episode documented A. fib on monitor.  This patients CHA2DS2-VASc Score and unadjusted Ischemic Stroke Rate (% per year) is equal to 4.8 % stroke rate/year from a score of 4 Above score calculated as 1 point each if present [CHF, HTN, DM, Vascular=MI/PAD/Aortic Plaque, Age if 65-74, or Male]; 2 points each if present [Age > 75, or Stroke/TIA/TE]   On beta-blocker now with controlled rate.  Will need to see how frequent his episodes are noted determine if he may need antiarrhythmic.  We will give 25 mg metoprolol tartrate to use PRN longer-lasting palpitations either A. fib or PVCs.  We discussed the importance of anticoagulation: Start Xarelto 20 mg daily.      Relevant Medications   rivaroxaban (XARELTO) 20 MG TABS tablet   metoprolol tartrate (LOPRESSOR) 25 MG  tablet   Other Relevant Orders   EKG 12-Lead   Ventricular bigeminy (Chronic)    Still wearing monitor.  I suspect this is the source of the bradycardia noted during CT surgery follow-up. Ultimately, we would need to have him back on beta-blocker so he is restarted now on Toprol.  However will hold off on titrating up further as his resting heart rate now is 56.      Relevant Medications   rivaroxaban (XARELTO) 20 MG TABS tablet   metoprolol tartrate (LOPRESSOR) 25 MG tablet   Other Relevant Orders   EKG 12-Lead      I spent a total of 40 minutes with the patient and chart review. >  50% of the time was spent in direct patient consultation.   Current medicines are reviewed at length with the patient today.  (+/- concerns) N/A The following changes have been made:  N/A  Patient Instructions  MEDICATION CHANGES  START XARELTO 20 MG  ONE TABLET  AT DINNER TIME ( BIGGEST MEAL OF THE DAY.   MAY TAKE METOPROLOL TARTRATE 25 MG  FOR PROLONG INCREASE HEART RATE  OR PALPATIONS    KEEP SCHEDULE APPOINTMENT TO HAVE ECHO DONE IN NOV 2019.    Your physician recommends that you schedule a follow-up appointment in NOV 2019 [place].   If you need a refill on your cardiac medications before your next appointment, please call your pharmacy.   Studies Ordered:   Orders Placed This Encounter  Procedures  . EKG 12-Lead      [name], M.D., M.S. Interventional Cardiologist    Pager # [phone] Phone # [phone] [address] [place], [place] [address]   Thank you for choosing [facility] at [facility]!!

## 2017-10-23 ENCOUNTER — Telehealth: Payer: Self-pay | Admitting: Cardiology

## 2017-10-23 ENCOUNTER — Encounter: Payer: Self-pay | Admitting: Cardiology

## 2017-10-23 NOTE — Assessment & Plan Note (Signed)
No further anginal episodes post CABG.  Multivessel disease noted and now doing well post CABG with exception of a short run of A. fib on monitor.  On aspirin which we are going to reduce to 81 mg and stop shortly thereafter because of Xarelto. On beta-blocker, ACE inhibitor and statin. No nitroglycerin requirement.

## 2017-10-23 NOTE — Assessment & Plan Note (Addendum)
So far only one episode documented A. fib on monitor.  This patients CHA2DS2-VASc Score and unadjusted Ischemic Stroke Rate (% per year) is equal to 4.8 % stroke rate/year from a score of 4 Above score calculated as 1 point each if present [CHF, HTN, DM, Vascular=MI/PAD/Aortic Plaque, Age if 65-74, or Male]; 2 points each if present [Age > 75, or Stroke/TIA/TE]   On beta-blocker now with controlled rate.  Will need to see how frequent his episodes are noted determine if he may need antiarrhythmic.  We will give 25 mg metoprolol tartrate to use PRN longer-lasting palpitations either A. fib or PVCs.  We discussed the importance of anticoagulation: Start Xarelto 20 mg daily.

## 2017-10-23 NOTE — Assessment & Plan Note (Signed)
Reduced EF, hopefully will improve post CABG.  We can recheck an echocardiogram 3 months post CABG. On ACE inhibitor and and now low-dose beta-blocker.  Since we had reduced his beta-blocker dose we may build titrate up back up the ACE inhibitor, but for now we will continue current dosing. He is euvolemic with no heart failure symptoms.  We therefore not require diuretic.

## 2017-10-23 NOTE — Assessment & Plan Note (Signed)
Still wearing monitor.  I suspect this is the source of the bradycardia noted during CT surgery follow-up. Ultimately, we would need to have him back on beta-blocker so he is restarted now on Toprol.  However will hold off on titrating up further as his resting heart rate now is 56.

## 2017-10-23 NOTE — Telephone Encounter (Signed)
Returned call to patient, states he was prescribed Xarelto at [place] yesterday and when he got home he realized he was still on 325mg  Aspirin.   He wanted to verify that he needed to stay on this dose instead of decreasing to 81 mg.     Hx: CABG 7/25, started on Xarelto yesterday for Afib.   Advised to continue at current and would verify with Dr. Ellyn Hack.

## 2017-10-23 NOTE — Assessment & Plan Note (Signed)
Most recent LAD pre-CABG was 82.  Is on high-dose atorvastatin.  Will need to recheck in roughly November timeframe.  Target LDL now is less than 70 closer to 50. No myalgias.

## 2017-10-23 NOTE — Telephone Encounter (Signed)
New Message:    Pt c/o medication issue:  1. Name of Medication: rivaroxaban (XARELTO) 20 MG TABS tablet  2. How are you currently taking this medication (dosage and times per day)?  Take 1 tablet (20 mg total) by mouth daily with supper.  Patient has questions about his medication

## 2017-10-24 NOTE — Telephone Encounter (Signed)
Reduce to 81 mg ASA.  Taylor Hew, MD

## 2017-10-25 ENCOUNTER — Encounter (HOSPITAL_COMMUNITY)
Admission: RE | Admit: 2017-10-25 | Discharge: 2017-10-25 | Disposition: A | Discharge status: Home / Self Care | Payer: PPO | Source: Ambulatory Visit | Attending: Cardiology | Admitting: Cardiology

## 2017-10-25 VITALS — BP 110/70 | HR 63 | Ht 71.0 in | Wt 205.9 lb

## 2017-10-25 DIAGNOSIS — Z87442 Personal history of urinary calculi: Secondary | ICD-10-CM | POA: Diagnosis not present

## 2017-10-25 DIAGNOSIS — Z7984 Long term (current) use of oral hypoglycemic drugs: Secondary | ICD-10-CM | POA: Diagnosis not present

## 2017-10-25 DIAGNOSIS — I10 Essential (primary) hypertension: Secondary | ICD-10-CM | POA: Diagnosis not present

## 2017-10-25 DIAGNOSIS — Z951 Presence of aortocoronary bypass graft: Secondary | ICD-10-CM | POA: Diagnosis not present

## 2017-10-25 DIAGNOSIS — Z79899 Other long term (current) drug therapy: Secondary | ICD-10-CM | POA: Diagnosis not present

## 2017-10-25 DIAGNOSIS — Z7901 Long term (current) use of anticoagulants: Secondary | ICD-10-CM | POA: Diagnosis not present

## 2017-10-25 DIAGNOSIS — E119 Type 2 diabetes mellitus without complications: Secondary | ICD-10-CM | POA: Diagnosis not present

## 2017-10-25 DIAGNOSIS — E7800 Pure hypercholesterolemia, unspecified: Secondary | ICD-10-CM | POA: Insufficient documentation

## 2017-10-25 DIAGNOSIS — I2510 Atherosclerotic heart disease of native coronary artery without angina pectoris: Secondary | ICD-10-CM | POA: Insufficient documentation

## 2017-10-25 DIAGNOSIS — Z7982 Long term (current) use of aspirin: Secondary | ICD-10-CM | POA: Insufficient documentation

## 2017-10-25 DIAGNOSIS — Z8249 Family history of ischemic heart disease and other diseases of the circulatory system: Secondary | ICD-10-CM | POA: Insufficient documentation

## 2017-10-25 MED ORDER — ASPIRIN EC 81 MG PO TBEC: 81.0000 mg | DELAYED_RELEASE_TABLET | Freq: Every day | ORAL | 3 refills | Status: AC

## 2017-10-25 NOTE — Telephone Encounter (Signed)
Patient made aware and verbalized understanding.  Med list updated

## 2017-10-25 NOTE — Progress Notes (Signed)
Taylor [name] 67 y.o. male DOB: [birth year] MRN: [id number]      Nutrition Note  No diagnosis found. Past Medical History:  Diagnosis Date  . Bigeminal rhythm 08/01/2017   per EKG 07-24-2017 in epic (per confirmation new since last ecg in 2007)  in care everywhere in epic ecg dated 2012 shows sinus brady rate 53 no pvc's --per pt asymptomatic and   . Bradycardia   . Coronary artery disease involving native coronary artery 08/2017   Abnormal Myoview 08/2017 (Abnormal EKG - Severe ST depressions, PVCs & nsVT). TID noted.  Moderate LAD territory Ischemic Perfusion defect --> Cath with extensive RCA (RPDA & RPAV), Cx & LAD disease --> CABG x 4  . Family history of premature coronary artery disease    Father - MI 50. Paternal uncle x 2 -1 with MI & other with CABG in 66s  . History of contusion    ED visit 08-04-2013 , 9 days past injury ,  dx concussion w/ LOC, per CT inferior frontal lobes contusion hemarrhage-- residual headache (08-01-2017  per pt residual resolved)  . History of kidney stones   . Hypercholesterolemia   . Hypertension   . Nephrolithiasis    bilateral nonobstructive per CT 07-24-2017  . S/P CABG x 4 09/06/2017   LIMA to LAD, SVG to OM2, Sequential SVG to PDA and RPL2, EVH via right thigh and leg  . Type 2 diabetes mellitus ([place])    followed by pcp  . Ureteral calculi    left  . Wears glasses   . Wears hearing aid in both ears    Meds reviewed.   Current Outpatient Medications (Endocrine & Metabolic):  .  sitaGLIPtan-metformin (JANUMET) 50-500 MG per tablet, Take 1 tablet by mouth 2 (two) times daily with a meal.   Current Outpatient Medications (Cardiovascular):  .  atorvastatin (LIPITOR) 80 MG tablet, Take 1 tablet (80 mg total) by mouth daily at 6 PM. .  lisinopril (PRINIVIL,ZESTRIL) 10 MG tablet, Take 1 tablet (10 mg total) by mouth daily. .  metoprolol succinate (TOPROL XL) 25 MG 24 hr tablet, Take 1 tablet (25 mg total) by mouth daily. .  metoprolol tartrate  (LOPRESSOR) 25 MG tablet, Take 1 tablet (25 mg total) by mouth as needed. TAKE FOR PROLONG INCREASE HEART RATE OR PALPATIONS   Current Outpatient Medications (Analgesics):  .  aspirin 325 MG EC tablet, Take 1 tablet (325 mg total) by mouth daily.  Current Outpatient Medications (Hematological):  .  rivaroxaban (XARELTO) 20 MG TABS tablet, Take 1 tablet (20 mg total) by mouth daily with supper.  Current Outpatient Medications (Other):  Marland Kitchen  Multiple Vitamin (MULTIVITAMIN) tablet, Take 1 tablet by mouth daily.   HT: Ht Readings from Last 1 Encounters:  10/22/17 5\' 11"  (1.803 m)    WT: Wt Readings from Last 5 Encounters:  10/22/17 204 lb 9.6 oz (92.8 kg)  10/10/17 202 lb 12.8 oz (92 kg)  10/08/17 203 lb (92.1 kg)  09/14/17 203 lb 12.8 oz (92.4 kg)  09/11/17 202 lb 9.6 oz (91.9 kg)     There is no height or weight on file to calculate BMI.   Current tobacco use? No  Labs:  Lipid Panel  No results found for: CHOL, TRIG, HDL, CHOLHDL, VLDL, LDLCALC, LDLDIRECT  Lab Results  Component Value Date   HGBA1C 7.6 (H) 09/04/2017   CBG (last 3)  No results for input(s): GLUCAP in the last 72 hours.  Nutrition Note Spoke with pt. Nutrition  plan and goals reviewed with pt. Pt is following Step 2 of the Therapeutic Lifestyle Changes diet. Pt has no desire for weight change at this time. Discussed with patient that even a small percentage of weight change could impact blood glucose levels. Heart healthy tips reviewed (label reading, how to build a healthy plate, portion sizes, eating frequently across the day, decreasing frequency of eating out). Pt was resistant to dietitian recommendations today, will utilize nutrition education and motivational interviewing to help draw light between patients behaviors currently and patients goals. Pt is diabetic. Last A1c indicates blood glucose not well-controlled. This Probation officer went over Diabetes Education test results. Pt checks CBG's rarely, 1-2 times a  week. Fasting CBG's reportedly 130-150's mg/dL. Recommended pt check CBG's more regularly, to help increase awareness of blood glucose levels. Per discussion, pt does not use canned/convenience foods often. Pt rarely adds salt to food. Pt eats out frequently, ~4x a week, reports that he does not cook and has little interest in learning now. Pt expressed understanding of the information reviewed. Pt aware of nutrition education classes offered and plans on attending nutrition classes.  Nutrition Diagnosis ? Food-and nutrition-related knowledge deficit related to lack of exposure to information as related to diagnosis of: ? CVD ? DM   Nutrition Intervention ? Pt's individual nutrition plan and goals reviewed with pt.  Nutrition Goal(s):   ? Pt to identify and limit food sources of saturated fat, trans fat, refined carbohydrates and sodium ? Improved blood glucose control as evidenced by pt's A1c trending from 7.6 toward less than 7.0. ? Pt able to name foods that affect blood glucose.   Plan:  ? Pt to attend nutrition classes ? Nutrition I ? Nutrition II ? Portion Distortion  ? Diabetes Blitz ? Diabetes Q & Ae determined ? Will provide client-centered nutrition education as part of interdisciplinary care ? Monitor and evaluate progress toward nutrition goal with team.   Laurina Bustle, MS, RD, LDN 10/25/2017 8:32 AM

## 2017-10-25 NOTE — Progress Notes (Signed)
Cardiac Individual Treatment Plan  Patient Details  Name: Taylor [name] MRN: [id number] Date of Birth: 01-Dec-1950 Referring Provider:     CARDIAC REHAB PHASE II ORIENTATION from 10/25/2017 in [facility]  Referring Provider  Leonie Man MD       Initial Encounter Date:    CARDIAC REHAB PHASE II ORIENTATION from 10/25/2017 in [facility]  Date  10/25/17      Visit Diagnosis: S/P CABG x 4 09/06/17  Patient's Home Medications on Admission:  Current Outpatient Medications:  .  aspirin 325 MG EC tablet, Take 1 tablet (325 mg total) by mouth daily., Disp: 30 tablet, Rfl: 1 .  atorvastatin (LIPITOR) 80 MG tablet, Take 1 tablet (80 mg total) by mouth daily at 6 PM., Disp: 30 tablet, Rfl: 1 .  lisinopril (PRINIVIL,ZESTRIL) 10 MG tablet, Take 1 tablet (10 mg total) by mouth daily., Disp: 30 tablet, Rfl: 1 .  metoprolol succinate (TOPROL XL) 25 MG 24 hr tablet, Take 1 tablet (25 mg total) by mouth daily., Disp: 90 tablet, Rfl: 3 .  metoprolol tartrate (LOPRESSOR) 25 MG tablet, Take 1 tablet (25 mg total) by mouth as needed. TAKE FOR PROLONG INCREASE HEART RATE OR PALPATIONS, Disp: 20 tablet, Rfl: 6 .  Multiple Vitamin (MULTIVITAMIN) tablet, Take 1 tablet by mouth daily., Disp: , Rfl:  .  rivaroxaban (XARELTO) 20 MG TABS tablet, Take 1 tablet (20 mg total) by mouth daily with supper., Disp: 30 tablet, Rfl: 6 .  sitaGLIPtan-metformin (JANUMET) 50-500 MG per tablet, Take 1 tablet by mouth 2 (two) times daily with a meal. , Disp: , Rfl:   Past Medical History: Past Medical History:  Diagnosis Date  . Bigeminal rhythm 08/01/2017   per EKG 07-24-2017 in epic (per confirmation new since last ecg in 2007)  in care everywhere in epic ecg dated 2012 shows sinus brady rate 53 no pvc's --per pt asymptomatic and   . Bradycardia   . Coronary artery disease involving native coronary artery 08/2017   Abnormal Myoview 08/2017 (Abnormal EKG -  Severe ST depressions, PVCs & nsVT). TID noted.  Moderate LAD territory Ischemic Perfusion defect --> Cath with extensive RCA (RPDA & RPAV), Cx & LAD disease --> CABG x 4  . Family history of premature coronary artery disease    Father - MI 23. Paternal uncle x 2 -1 with MI & other with CABG in 28s  . History of contusion    ED visit 08-04-2013 , 9 days past injury ,  dx concussion w/ LOC, per CT inferior frontal lobes contusion hemarrhage-- residual headache (08-01-2017  per pt residual resolved)  . History of kidney stones   . Hypercholesterolemia   . Hypertension   . Nephrolithiasis    bilateral nonobstructive per CT 07-24-2017  . S/P CABG x 4 09/06/2017   LIMA to LAD, SVG to OM2, Sequential SVG to PDA and RPL2, EVH via right thigh and leg  . Type 2 diabetes mellitus ([place])    followed by pcp  . Ureteral calculi    left  . Wears glasses   . Wears hearing aid in both ears     Tobacco Use: Social History   Tobacco Use  Smoking Status Never Smoker  Smokeless Tobacco Never Used    Labs: Recent Review Flowsheet Data    Labs for ITP Cardiac and Pulmonary Rehab Latest Ref Rng & Units 09/06/2017 09/06/2017 09/06/2017 09/06/2017 09/07/2017   Hemoglobin A1c 4.8 -  5.6 % - - - - -   PHART 7.350 - 7.450 7.354 7.351 7.339(L) - -   PCO2ART 32.0 - 48.0 mmHg 41.5 37.2 43.0 - -   HCO3 20.0 - 28.0 mmol/L 23.4 20.4 23.2 - -   TCO2 22 - 32 mmol/L 25 22 25 23 22    ACIDBASEDEF 0.0 - 2.0 mmol/L 2.0 5.0(H) 3.0(H) - -   O2SAT % 97.0 98.0 99.0 - -      Capillary Blood Glucose: Lab Results  Component Value Date   GLUCAP 129 (H) 09/11/2017   GLUCAP 130 (H) 09/10/2017   GLUCAP 101 (H) 09/10/2017   GLUCAP 195 (H) 09/10/2017   GLUCAP 129 (H) 09/10/2017     Exercise Target Goals: Exercise Program Goal: Individual exercise prescription set using results from initial 6 min walk test and THRR while considering  patient's activity barriers and safety.   Exercise Prescription Goal: Initial exercise  prescription builds to 30-45 minutes a day of aerobic activity, 2-3 days per week.  Home exercise guidelines will be given to patient during program as part of exercise prescription that the participant will acknowledge.  Activity Barriers & Risk Stratification: Activity Barriers & Cardiac Risk Stratification - 10/25/17 1145      Activity Barriers & Cardiac Risk Stratification   Activity Barriers  None    Cardiac Risk Stratification  High       6 Minute Walk: 6 Minute Walk    Row Name 10/25/17 1144         6 Minute Walk   Phase  Initial     Distance  1710 feet     Walk Time  6 minutes     # of Rest Breaks  0     MPH  3.24     METS  3.77     RPE  11     Perceived Dyspnea   0     VO2 Peak  13.2     Symptoms  No     Resting HR  63 bpm     Resting BP  110/70     Resting Oxygen Saturation   97 %     Exercise Oxygen Saturation  during 6 min walk  97 %     Max Ex. HR  89 bpm     Max Ex. BP  148/60     2 Minute Post BP  124/72        Oxygen Initial Assessment:   Oxygen Re-Evaluation:   Oxygen Discharge (Final Oxygen Re-Evaluation):   Initial Exercise Prescription: Initial Exercise Prescription - 10/25/17 1100      Date of Initial Exercise RX and Referring Provider   Date  10/25/17    Referring Provider  Leonie Man MD     Expected Discharge Date  01/18/18      Treadmill   MPH  3    Grade  1    Minutes  10    METs  3.71      Bike   Level  1.2    Minutes  10    METs  3.38      NuStep   Level  2    SPM  75    Minutes  10    METs  2.8      Prescription Details   Frequency (times per week)  3x    Duration  Progress to 30 minutes of continuous aerobic without signs/symptoms of physical distress  Intensity   THRR 40-80% of Max Heartrate  61-122    Ratings of Perceived Exertion  11-13    Perceived Dyspnea  0-4      Progression   Progression  Continue progressive overload as per policy without signs/symptoms or physical distress.       Resistance Training   Training Prescription  Yes    Weight  3lbs    Reps  10-15       Perform Capillary Blood Glucose checks as needed.  Exercise Prescription Changes:   Exercise Comments:   Exercise Goals and Review: Exercise Goals    Row Name 10/25/17 1145             Exercise Goals   Increase Physical Activity  Yes       Intervention  Develop an individualized exercise prescription for aerobic and resistive training based on initial evaluation findings, risk stratification, comorbidities and participant's personal goals.;Provide advice, education, support and counseling about physical activity/exercise needs.       Expected Outcomes  Short Term: Attend rehab on a regular basis to increase amount of physical activity.;Long Term: Add in home exercise to make exercise part of routine and to increase amount of physical activity.;Long Term: Exercising regularly at least 3-5 days a week.       Increase Strength and Stamina  Yes       Intervention  Develop an individualized exercise prescription for aerobic and resistive training based on initial evaluation findings, risk stratification, comorbidities and participant's personal goals.;Provide advice, education, support and counseling about physical activity/exercise needs.       Expected Outcomes  Short Term: Increase workloads from initial exercise prescription for resistance, speed, and METs.;Short Term: Perform resistance training exercises routinely during rehab and add in resistance training at home;Long Term: Improve cardiorespiratory fitness, muscular endurance and strength as measured by increased METs and functional capacity (6MWT)       Able to understand and use rate of perceived exertion (RPE) scale  Yes       Intervention  Provide education and explanation on how to use RPE scale       Expected Outcomes  Short Term: Able to use RPE daily in rehab to express subjective intensity level;Long Term:  Able to use RPE to guide  intensity level when exercising independently       Knowledge and understanding of Target Heart Rate Range (THRR)  Yes       Intervention  Provide education and explanation of THRR including how the numbers were predicted and where they are located for reference       Expected Outcomes  Short Term: Able to state/look up THRR;Long Term: Able to use THRR to govern intensity when exercising independently;Short Term: Able to use daily as guideline for intensity in rehab       Able to check pulse independently  Yes       Intervention  Review the importance of being able to check your own pulse for safety during independent exercise;Provide education and demonstration on how to check pulse in carotid and radial arteries.       Expected Outcomes  Short Term: Able to explain why pulse checking is important during independent exercise;Long Term: Able to check pulse independently and accurately       Understanding of Exercise Prescription  Yes       Intervention  Provide education, explanation, and written materials on patient's individual exercise prescription       Expected Outcomes  Short Term: Able to explain program exercise prescription;Long Term: Able to explain home exercise prescription to exercise independently       Improve claudication pain toleration; Improve walking ability  Yes       Intervention  Participate in PAD/SET Rehab 2-3 days a week, walking at home as part of exercise prescription;Attend education sessions to aid in risk factor modification and understanding of disease process       Expected Outcomes  Short Term: Improve walking distance/time to onset of claudication pain;Long Term: Improve score of PAD questionnaires;Long Term: Improve walking ability and toleration to claudication          Exercise Goals Re-Evaluation :   Discharge Exercise Prescription (Final Exercise Prescription Changes):   Nutrition:  Target Goals: Understanding of nutrition guidelines, daily intake of  sodium 1500mg , cholesterol 200mg , calories 30% from fat and 7% or less from saturated fats, daily to have 5 or more servings of fruits and vegetables.  Biometrics: Pre Biometrics - 10/25/17 1145      Pre Biometrics   Height  5\' 11"  (1.803 m)    Weight  93.4 kg    Waist Circumference  42 inches    Hip Circumference  40 inches    Waist to Hip Ratio  1.05 %    BMI (Calculated)  28.73    Triceps Skinfold  16 mm    % Body Fat  28.8 %    Grip Strength  43 kg    Flexibility  10.5 in    Single Leg Stand  2.47 seconds        Nutrition Therapy Plan and Nutrition Goals: Nutrition Therapy & Goals - 10/25/17 0838      Nutrition Therapy   Diet  carb modified, heart healthy      Personal Nutrition Goals   Nutrition Goal  Pt to identify and limit food sources of saturated fat, trans fat, refined carbohydrates and sodium    Personal Goal #2  Improved blood glucose control as evidenced by pt's A1c trending from 7.6 toward less than 7.0.    Personal Goal #3  Pt able to name foods that affect blood glucose      Intervention Plan   Intervention  Prescribe, educate and counsel regarding individualized specific dietary modifications aiming towards targeted core components such as weight, hypertension, lipid management, diabetes, heart failure and other comorbidities.    Expected Outcomes  Short Term Goal: Understand basic principles of dietary content, such as calories, fat, sodium, cholesterol and nutrients.;Long Term Goal: Adherence to prescribed nutrition plan.       Nutrition Assessments: Nutrition Assessments - 10/25/17 0839      MEDFICTS Scores   Pre Score  32       Nutrition Goals Re-Evaluation:   Nutrition Goals Re-Evaluation:   Nutrition Goals Discharge (Final Nutrition Goals Re-Evaluation):   Psychosocial: Target Goals: Acknowledge presence or absence of significant depression and/or stress, maximize coping skills, provide positive support system. Participant is able to  verbalize types and ability to use techniques and skills needed for reducing stress and depression.  Initial Review & Psychosocial Screening: Initial Psych Review & Screening - 10/25/17 1233      Initial Review   Current issues with  None Identified      Family Dynamics   Good Support System?  Yes   Calen has his wife and children for support     Barriers   Psychosocial barriers to participate in program  There are no identifiable barriers  or psychosocial needs.      Screening Interventions   Interventions  Encouraged to exercise       Quality of Life Scores: Quality of Life - 10/25/17 1141      Quality of Life   Select  Quality of Life      Quality of Life Scores   Health/Function Pre  27.07 %    Socioeconomic Pre  26.86 %    Psych/Spiritual Pre  24.93 %    Family Pre  27.6 %    GLOBAL Pre  26.66 %      Scores of 19 and below usually indicate a poorer quality of life in these areas.  A difference of  2-3 points is a clinically meaningful difference.  A difference of 2-3 points in the total score of the Quality of Life Index has been associated with significant improvement in overall quality of life, self-image, physical symptoms, and general health in studies assessing change in quality of life.  PHQ-9: Recent Review Flowsheet Data    There is no flowsheet data to display.     Interpretation of Total Score  Total Score Depression Severity:  1-4 = Minimal depression, 5-9 = Mild depression, 10-14 = Moderate depression, 15-19 = Moderately severe depression, 20-27 = Severe depression   Psychosocial Evaluation and Intervention:   Psychosocial Re-Evaluation:   Psychosocial Discharge (Final Psychosocial Re-Evaluation):   Vocational Rehabilitation: Provide vocational rehab assistance to qualifying candidates.   Vocational Rehab Evaluation & Intervention: Vocational Rehab - 10/25/17 1235      Initial Vocational Rehab Evaluation & Intervention   Assessment shows  need for Vocational Rehabilitation  No       Education: Education Goals: Education classes will be provided on a weekly basis, covering required topics. Participant will state understanding/return demonstration of topics presented.  Learning Barriers/Preferences: Learning Barriers/Preferences - 10/25/17 1141      Learning Barriers/Preferences   Learning Barriers  Sight    Learning Preferences  Verbal Instruction;Individual Instruction;Pictoral       Education Topics: Count Your Pulse:  -Group instruction provided by verbal instruction, demonstration, patient participation and written materials to support subject.  Instructors address importance of being able to find your pulse and how to count your pulse when at home without a heart monitor.  Patients get hands on experience counting their pulse with staff help and individually.   Heart Attack, Angina, and Risk Factor Modification:  -Group instruction provided by verbal instruction, video, and written materials to support subject.  Instructors address signs and symptoms of angina and heart attacks.    Also discuss risk factors for heart disease and how to make changes to improve heart health risk factors.   Functional Fitness:  -Group instruction provided by verbal instruction, demonstration, patient participation, and written materials to support subject.  Instructors address safety measures for doing things around the house.  Discuss how to get up and down off the floor, how to pick things up properly, how to safely get out of a chair without assistance, and balance training.   Meditation and Mindfulness:  -Group instruction provided by verbal instruction, patient participation, and written materials to support subject.  Instructor addresses importance of mindfulness and meditation practice to help reduce stress and improve awareness.  Instructor also leads participants through a meditation exercise.    Stretching for Flexibility  and Mobility:  -Group instruction provided by verbal instruction, patient participation, and written materials to support subject.  Instructors lead participants through series of stretches that  are designed to increase flexibility thus improving mobility.  These stretches are additional exercise for major muscle groups that are typically performed during regular warm up and cool down.   Hands Only CPR:  -Group verbal, video, and participation provides a basic overview of AHA guidelines for community CPR. Role-play of emergencies allow participants the opportunity to practice calling for help and chest compression technique with discussion of AED use.   Hypertension: -Group verbal and written instruction that provides a basic overview of hypertension including the most recent diagnostic guidelines, risk factor reduction with self-care instructions and medication management.    Nutrition I class: Heart Healthy Eating:  -Group instruction provided by PowerPoint slides, verbal discussion, and written materials to support subject matter. The instructor gives an explanation and review of the Therapeutic Lifestyle Changes diet recommendations, which includes a discussion on lipid goals, dietary fat, sodium, fiber, plant stanol/sterol esters, sugar, and the components of a well-balanced, healthy diet.   Nutrition II class: Lifestyle Skills:  -Group instruction provided by PowerPoint slides, verbal discussion, and written materials to support subject matter. The instructor gives an explanation and review of label reading, grocery shopping for heart health, heart healthy recipe modifications, and ways to make healthier choices when eating out.   Diabetes Question & Answer:  -Group instruction provided by PowerPoint slides, verbal discussion, and written materials to support subject matter. The instructor gives an explanation and review of diabetes co-morbidities, pre- and post-prandial blood glucose  goals, pre-exercise blood glucose goals, signs, symptoms, and treatment of hypoglycemia and hyperglycemia, and foot care basics.   Diabetes Blitz:  -Group instruction provided by PowerPoint slides, verbal discussion, and written materials to support subject matter. The instructor gives an explanation and review of the physiology behind type 1 and type 2 diabetes, diabetes medications and rational behind using different medications, pre- and post-prandial blood glucose recommendations and Hemoglobin A1c goals, diabetes diet, and exercise including blood glucose guidelines for exercising safely.    Portion Distortion:  -Group instruction provided by PowerPoint slides, verbal discussion, written materials, and food models to support subject matter. The instructor gives an explanation of serving size versus portion size, changes in portions sizes over the last 20 years, and what consists of a serving from each food group.   Stress Management:  -Group instruction provided by verbal instruction, video, and written materials to support subject matter.  Instructors review role of stress in heart disease and how to cope with stress positively.     Exercising on Your Own:  -Group instruction provided by verbal instruction, power point, and written materials to support subject.  Instructors discuss benefits of exercise, components of exercise, frequency and intensity of exercise, and end points for exercise.  Also discuss use of nitroglycerin and activating EMS.  Review options of places to exercise outside of rehab.  Review guidelines for sex with heart disease.   Cardiac Drugs I:  -Group instruction provided by verbal instruction and written materials to support subject.  Instructor reviews cardiac drug classes: antiplatelets, anticoagulants, beta blockers, and statins.  Instructor discusses reasons, side effects, and lifestyle considerations for each drug class.   Cardiac Drugs II:  -Group  instruction provided by verbal instruction and written materials to support subject.  Instructor reviews cardiac drug classes: angiotensin converting enzyme inhibitors (ACE-I), angiotensin II receptor blockers (ARBs), nitrates, and calcium channel blockers.  Instructor discusses reasons, side effects, and lifestyle considerations for each drug class.   Anatomy and Physiology of the Circulatory System:  Group verbal  and written instruction and models provide basic cardiac anatomy and physiology, with the coronary electrical and arterial systems. Review of: AMI, Angina, Valve disease, Heart Failure, Peripheral Artery Disease, Cardiac Arrhythmia, Pacemakers, and the ICD.   Other Education:  -Group or individual verbal, written, or video instructions that support the educational goals of the cardiac rehab program.   Holiday Eating Survival Tips:  -Group instruction provided by PowerPoint slides, verbal discussion, and written materials to support subject matter. The instructor gives patients tips, tricks, and techniques to help them not only survive but enjoy the holidays despite the onslaught of food that accompanies the holidays.   Knowledge Questionnaire Score: Knowledge Questionnaire Score - 10/25/17 1139      Knowledge Questionnaire Score   Pre Score  21/24       Core Components/Risk Factors/Patient Goals at Admission: Personal Goals and Risk Factors at Admission - 10/25/17 1143      Core Components/Risk Factors/Patient Goals on Admission    Weight Management  Yes;Weight Loss;Obesity    Intervention  Weight Management: Develop a combined nutrition and exercise program designed to reach desired caloric intake, while maintaining appropriate intake of nutrient and fiber, sodium and fats, and appropriate energy expenditure required for the weight goal.;Weight Management: Provide education and appropriate resources to help participant work on and attain dietary goals.;Weight  Management/Obesity: Establish reasonable short term and long term weight goals.;Obesity: Provide education and appropriate resources to help participant work on and attain dietary goals.    Admit Weight  205 lb 14.6 oz (93.4 kg)    Goal Weight: Short Term  200 lb (90.7 kg)    Goal Weight: Long Term  190 lb (86.2 kg)    Expected Outcomes  Short Term: Continue to assess and modify interventions until short term weight is achieved;Long Term: Adherence to nutrition and physical activity/exercise program aimed toward attainment of established weight goal;Weight Loss: Understanding of general recommendations for a balanced deficit meal plan, which promotes 1-2 lb weight loss per week and includes a negative energy balance of [id number] kcal/d;Understanding recommendations for meals to include 15-35% energy as protein, 25-35% energy from fat, 35-60% energy from carbohydrates, less than 200mg  of dietary cholesterol, 20-35 gm of total fiber daily;Understanding of distribution of calorie intake throughout the day with the consumption of 4-5 meals/snacks    Diabetes  Yes    Intervention  Provide education about signs/symptoms and action to take for hypo/hyperglycemia.;Provide education about proper nutrition, including hydration, and aerobic/resistive exercise prescription along with prescribed medications to achieve blood glucose in normal ranges: Fasting glucose 65-99 mg/dL    Expected Outcomes  Long Term: Attainment of HbA1C < 7%.;Short Term: Participant verbalizes understanding of the signs/symptoms and immediate care of hyper/hypoglycemia, proper foot care and importance of medication, aerobic/resistive exercise and nutrition plan for blood glucose control.    Hypertension  Yes    Intervention  Provide education on lifestyle modifcations including regular physical activity/exercise, weight management, moderate sodium restriction and increased consumption of fresh fruit, vegetables, and low fat dairy, alcohol  moderation, and smoking cessation.;Monitor prescription use compliance.    Expected Outcomes  Short Term: Continued assessment and intervention until BP is < 140/14mm HG in hypertensive participants. < 130/75mm HG in hypertensive participants with diabetes, heart failure or chronic kidney disease.;Long Term: Maintenance of blood pressure at goal levels.    Lipids  Yes    Intervention  Provide education and support for participant on nutrition & aerobic/resistive exercise along with prescribed medications to achieve LDL 70mg ,  HDL >40mg .    Expected Outcomes  Short Term: Participant states understanding of desired cholesterol values and is compliant with medications prescribed. Participant is following exercise prescription and nutrition guidelines.;Long Term: Cholesterol controlled with medications as prescribed, with individualized exercise RX and with personalized nutrition plan. Value goals: LDL < 70mg , HDL > 40 mg.       Core Components/Risk Factors/Patient Goals Review:    Core Components/Risk Factors/Patient Goals at Discharge (Final Review):    ITP Comments: ITP Comments    Row Name 10/25/17 1135           ITP Comments  Dr. Fransico Him, Medical Director           Comments: Wenceslao attended orientation from [phone] to [phone] to review rules and guidelines for program. Completed 6 minute walk test, Intitial ITP, and exercise prescription.  VSS. Telemetry-Sinus Rhythm with an inverted t wave frequent PVC's this has been previously documented. Intermittent trigeminal and bigeminal PVC's noted.  Asymptomatic. Will fax exercise flow sheets to Dr. Allison Quarry office for review with today's ECG tracings.Barnet Pall, RN,BSN 10/25/2017 12:44 PM

## 2017-10-27 ENCOUNTER — Other Ambulatory Visit: Payer: Self-pay

## 2017-10-27 ENCOUNTER — Emergency Department (HOSPITAL_COMMUNITY)
Admission: EM | Admit: 2017-10-27 | Discharge: 2017-10-27 | Disposition: A | Discharge status: Home / Self Care | Payer: PPO | Attending: Emergency Medicine | Admitting: Emergency Medicine

## 2017-10-27 ENCOUNTER — Telehealth: Payer: Self-pay | Admitting: Physician Assistant

## 2017-10-27 DIAGNOSIS — Z7982 Long term (current) use of aspirin: Secondary | ICD-10-CM | POA: Diagnosis not present

## 2017-10-27 DIAGNOSIS — Z79899 Other long term (current) drug therapy: Secondary | ICD-10-CM | POA: Insufficient documentation

## 2017-10-27 DIAGNOSIS — I2510 Atherosclerotic heart disease of native coronary artery without angina pectoris: Secondary | ICD-10-CM | POA: Diagnosis not present

## 2017-10-27 DIAGNOSIS — E119 Type 2 diabetes mellitus without complications: Secondary | ICD-10-CM | POA: Diagnosis not present

## 2017-10-27 DIAGNOSIS — I10 Essential (primary) hypertension: Secondary | ICD-10-CM | POA: Insufficient documentation

## 2017-10-27 DIAGNOSIS — R040 Epistaxis: Secondary | ICD-10-CM

## 2017-10-27 DIAGNOSIS — Z951 Presence of aortocoronary bypass graft: Secondary | ICD-10-CM | POA: Insufficient documentation

## 2017-10-27 MED ORDER — TRANEXAMIC ACID 1000 MG/10ML IV SOLN
500.0000 mg | Freq: Once | INTRAVENOUS | Status: AC
  Administered 2017-10-27: 500 mg via TOPICAL
  Filled 2017-10-27: qty 10

## 2017-10-27 NOTE — Discharge Instructions (Addendum)
If your nose starts bleeding again, soak a cotton ball or gauze with Afrin and apply it to the nare with pressure. Hold off on your blood thinner until you follow-up with your cardiologist. Schedule appointment with ENT as needed for continued nosebleeding or for further evaluation. Return to the emergency room if you develop nosebleed that lasts after applying pressure for more than 20 minutes. Return if you feel weak, dizzy, lightheaded, or have any new or concerning symptoms.

## 2017-10-27 NOTE — ED Triage Notes (Signed)
Nose started bleeding 700 am.  On xarelto since Wednesday. Bleeding controlled w/ pressure.

## 2017-10-27 NOTE — ED Notes (Signed)
EDP at bedside. Bleeding continues.

## 2017-10-27 NOTE — Telephone Encounter (Signed)
Paged by answering service. Patient has constant "pouring" blood from R nostril since this morning. Recent CABG 7/25 and newly diagnosed afib. Currently takes Xarelto 20mg  and ASA 81mg  qd. No other cardiac symptoms.  Discussed with Dr. Harrell Gave. Advised patient to apply pressure. If bleeding does not stop he can use OTC Afrin Nasal spray. Still ongoing bleeding >> advised to go to ER for packing. Patient voice understanding and agree with plan. Meanwhile advised to continue ASA 81mg  and hold Xeralto (Last dose PM of 9/13). Call us back Monday to discuss further direction from Dr. Ellyn Hack.

## 2017-10-27 NOTE — ED Provider Notes (Signed)
[facility] EMERGENCY DEPARTMENT Provider Note   CSN: [id number] Arrival date & time: 10/27/17  1610     History   Chief Complaint Chief Complaint  Patient presents with  . Epistaxis    HPI Taylor [name] is a 67 y.o. male presenting for evaluation of nosebleed.  Patient states at 7:00 this morning, he started to bleed from his right nare unprovoked.  Since then, he has had persistent bleeding.  He tried Afrin 3 times without improvement of his symptoms.  Patient was started on a blood thinner on Wednesday for a fib.  He was also taking a full dose aspirin.  He called his cardiologist, who told him to take a baby aspirin.  He has had continued bleeding.  He denies trauma to the nose.  He reports frequent nosebleeds as a child, has had none as adult.  He denies dizziness, lightheadedness, or feeling faint.  He denies chest pain, shortness of breath, abdominal pain.  HPI  Past Medical History:  Diagnosis Date  . Bigeminal rhythm 08/01/2017   per EKG 07-24-2017 in epic (per confirmation new since last ecg in 2007)  in care everywhere in epic ecg dated 2012 shows sinus brady rate 53 no pvc's --per pt asymptomatic and   . Bradycardia   . Coronary artery disease involving native coronary artery 08/2017   Abnormal Myoview 08/2017 (Abnormal EKG - Severe ST depressions, PVCs & nsVT). TID noted.  Moderate LAD territory Ischemic Perfusion defect --> Cath with extensive RCA (RPDA & RPAV), Cx & LAD disease --> CABG x 4  . Family history of premature coronary artery disease    Father - MI 62. Paternal uncle x 2 -1 with MI & other with CABG in 84s  . History of contusion    ED visit 08-04-2013 , 9 days past injury ,  dx concussion w/ LOC, per CT inferior frontal lobes contusion hemarrhage-- residual headache (08-01-2017  per pt residual resolved)  . History of kidney stones   . Hypercholesterolemia   . Hypertension   . Nephrolithiasis    bilateral nonobstructive per CT  07-24-2017  . S/P CABG x 4 09/06/2017   LIMA to LAD, SVG to OM2, Sequential SVG to PDA and RPL2, EVH via right thigh and leg  . Type 2 diabetes mellitus ([place])    followed by pcp  . Ureteral calculi    left  . Wears glasses   . Wears hearing aid in both ears     Patient Active Problem List   Diagnosis Date Noted  . New onset atrial fibrillation ([place]) 10/22/2017  . Ischemic cardiomyopathy - pre-CABG ECHO: 40-45% 09/14/2017  . S/P CABG x 4 09/06/2017  . Coronary artery disease involving native coronary artery of native heart without angina pectoris 09/03/2017  . Ventricular bigeminy 08/29/2017  . Essential hypertension 08/29/2017  . Hyperlipidemia associated with type 2 diabetes mellitus ([place]) 08/29/2017  . Family history of premature coronary artery disease 08/29/2017  . Frequent unifocal PVCs 08/29/2017    Past Surgical History:  Procedure Laterality Date  . CORONARY ARTERY BYPASS GRAFT N/A 09/06/2017   Procedure: CORONARY ARTERY BYPASS GRAFTING (CABG) x 4, using left internal mammary artery and right leg greater saphenous vein harvested endoscopically;  Surgeon: Rexene Alberts, MD;  Location: [facility] OR;  Service: Open Heart Surgery;; LIMA-LAD, SVG-OM2, SeqSVG-RPDA-RPL2  . CYSTOSCOPY/RETROGRADE/URETEROSCOPY Left 08/06/2017   Procedure: CYSTOSCOPY/RETROGRADE/URETEROSCOPY laser and stone basket extrection;  Surgeon: Cleon Gustin, MD;  Location: [facility];  Service: Urology;  Laterality: Left;  . LAPAROSCOPIC APPENDECTOMY  11-18-2005    dr wyatt  [facility]  . LEFT HEART CATH AND CORONARY ANGIOGRAPHY N/A 09/03/2017   Procedure: LEFT HEART CATH AND CORONARY ANGIOGRAPHY;  Surgeon: Leonie Man, MD;  Location: [facility];  Service: Cardiovascular;; mRCA 70%, dRCA 99%-95% into RPAV & 90% ostRPDA 95%. RPL1 70%, distal RVAM (tandem PDA) - 85%. Ost-prox LAD 40% -pLAD 60&90% @ D1 ->p-mLAD 95% @ D2 with 70% D2. pCx 80%, p-mCx 90%-> mCx 85%.  EF 45-50%, global HK. -->>  referred for CABG  . NM MYOVIEW LTD  08/31/2017   HIGH RISK: Combination Imaging & EKG:  with  in tmedium sized, moderate severity Reversible perfusion e apdefect in  the mid and apical anterior, apical lateral walls - FINDINGS ARE C/W ISCHEMIA in the LAD territory,  however given significant arrhythmias, severe ST depressions and TID =  MV CAD is highly likely (SDS =7).   . ORCHIECTOMY  1965   unilateral -- per pt ruptured  . TEE WITHOUT CARDIOVERSION N/A 09/06/2017   Procedure: TRANSESOPHAGEAL ECHOCARDIOGRAM (TEE);  INTRA-OP.  Surgeon: Rexene Alberts, MD;  Location: [facility] OR;  Service: Open Heart Surgery:;; Normal AoV, MV & TV.  Normal RV.  Marland Kitchen TRANSTHORACIC ECHOCARDIOGRAM  09/04/2017   (pre-CABG): EF 40-45%, diffuse HK.  Difficult to assess due to frequent PVCs. Severe LA dilation. Mildly increased PAP.  Marland Kitchen UMBILICAL HERNIA REPAIR  2006  . VARICOSE VEIN SURGERY  2007   left endovascular laser ablation        Home Medications    Prior to Admission medications   Medication Sig Start Date End Date Taking? Authorizing Provider  aspirin EC 81 MG tablet Take 1 tablet (81 mg total) by mouth daily. 10/25/17   Leonie Man, MD  atorvastatin (LIPITOR) 80 MG tablet Take 1 tablet (80 mg total) by mouth daily at 6 PM. 09/11/17   Elgie Collard, PA-C  lisinopril (PRINIVIL,ZESTRIL) 10 MG tablet Take 1 tablet (10 mg total) by mouth daily. 10/08/17   Nani Skillern, PA-C  metoprolol succinate (TOPROL XL) 25 MG 24 hr tablet Take 1 tablet (25 mg total) by mouth daily. 10/10/17   Almyra Deforest, PA  metoprolol tartrate (LOPRESSOR) 25 MG tablet Take 1 tablet (25 mg total) by mouth as needed. TAKE FOR PROLONG INCREASE HEART RATE OR PALPATIONS 10/22/17 01/20/18  Leonie Man, MD  Multiple Vitamin (MULTIVITAMIN) tablet Take 1 tablet by mouth daily.    [provider]  rivaroxaban (XARELTO) 20 MG TABS tablet Take 1 tablet (20 mg total) by mouth daily with supper. 10/22/17   Leonie Man, MD    sitaGLIPtan-metformin (JANUMET) 50-500 MG per tablet Take 1 tablet by mouth 2 (two) times daily with a meal.     [provider]    Family History Family History  Problem Relation Age of Onset  . Stroke Mother   . Atrial fibrillation Mother   . Clotting disorder Father   . Heart attack Father 86       Died from complications  . Diabetes Daughter   . Heart disease Other   . Stroke Other   . Heart attack Paternal Uncle        In his 32s  . CAD Paternal Uncle        Has had either bypass surgery or stents    Social History Social History   Tobacco Use  . Smoking status: Never Smoker  .  Smokeless tobacco: Never Used  Substance Use Topics  . Alcohol use: No  . Drug use: No     Allergies   Patient has no known allergies.   Review of Systems Review of Systems  HENT: Positive for nosebleeds. Negative for congestion and sore throat.   Respiratory: Negative for cough and shortness of breath.   Skin: Negative for pallor.  Neurological: Negative for dizziness, numbness and headaches.     Physical Exam Updated Vital Signs BP 139/87   Pulse 78   Temp 98.1 F (36.7 C) (Oral)   Resp 16   Ht 5\' 11"  (1.803 m)   Wt 90.7 kg   SpO2 98%   BMI 27.89 kg/m   Physical Exam  Constitutional: He is oriented to person, place, and time. He appears well-developed and well-nourished. No distress.  Resting comfortably in bed with rag pressed to his nose.  HENT:  Head: Normocephalic and atraumatic.  Nose: Epistaxis is observed.  Bleeding from the right nare.  No obvious source of bleeding, appears posterior.  No noted in the OP.  Airway intact.  No bleeding from the left nare  Eyes: Pupils are equal, round, and reactive to light. Conjunctivae and EOM are normal.  Neck: Normal range of motion. Neck supple.  Cardiovascular: Normal rate, regular rhythm and intact distal pulses.  Pulmonary/Chest: Effort normal and breath sounds normal. No respiratory distress. He has no  wheezes.  Abdominal: Soft. He exhibits no distension. There is no tenderness.  Musculoskeletal: Normal range of motion.  Neurological: He is alert and oriented to person, place, and time.  Skin: Skin is warm and dry. Capillary refill takes less than 2 seconds. No pallor.  Psychiatric: He has a normal mood and affect.  Nursing note and vitals reviewed.    ED Treatments / Results  Labs (all labs ordered are listed, but only abnormal results are displayed) Labs Reviewed - No data to display  EKG None  Radiology No results found.  Procedures .Epistaxis Management Date/Time: 10/27/2017 10:36 PM Performed by: Franchot Heidelberg, PA-C Authorized by: Franchot Heidelberg, PA-C   Consent:    Consent obtained:  Verbal   Consent given by:  Patient   Risks discussed:  Bleeding, nasal injury and pain Procedure details:    Treatment site:  R posterior   Treatment method:  Nasal balloon and nasal tampon   Treatment complexity:  Extensive   Treatment episode: initial   Post-procedure details:    Assessment:  Bleeding stopped   Patient tolerance of procedure:  Tolerated well, no immediate complications   (including critical care time)  Medications Ordered in ED Medications  tranexamic acid (CYKLOKAPRON) injection 500 mg (500 mg Topical Given 10/27/17 1954)     Initial Impression / Assessment and Plan / ED Course  I have reviewed the triage vital signs and the nursing notes.  Pertinent labs & imaging results that were available during my care of the patient were reviewed by me and considered in my medical decision making (see chart for details).     Patient presenting for evaluation of nosebleed.  Physical exam reassuring, afebrile not tachycardic.  Doubt significant blood loss.  However, patient with continued bleed, despite pressure and Afrin at home.  Will try TXA soaked gauze and reassess.  On reassessment, bleeding improved.  Patient went to sit up from the bed, and started  bleeding again.  New TXA soaked gauze given.  On reassessment, bleeding has stopped.  We will plan to discharge patient.  Patient  started having minimal oozing again.  Rapid rhino placed as described above.  Initially soaked in lidocaine.  Rapid rhino inserted and inflated.  Bleeding stopped.  Discussed follow-up with ENT for removal.  Pt to follow-up with cardiology regarding blood thinner.  At this time, patient present for discharge.  Return precautions given.  Patient states he understands agrees plan.  Final Clinical Impressions(s) / ED Diagnoses   Final diagnoses:  Epistaxis    ED Discharge Orders    None       Franchot Heidelberg, PA-C 10/27/17 2242    Lennice Sites, DO 10/27/17 2334

## 2017-10-27 NOTE — ED Triage Notes (Signed)
Pt used afrin with no relief.

## 2017-10-28 NOTE — Telephone Encounter (Signed)
Agree with plan -- this would be the appropriate thing to do in the future -- after 24 hr, Xarelto is out of his Sx.  Can restart after 48-72 hours depending on amount of bleeding & cause.  [name]

## 2017-10-29 ENCOUNTER — Telehealth: Payer: Self-pay | Admitting: Cardiology

## 2017-10-29 NOTE — Telephone Encounter (Signed)
Hold off on restarting until seen by ENT.  [name], MD

## 2017-10-29 NOTE — Telephone Encounter (Signed)
Returned call to patient-patient states he started having a nose bleed on Saturday that he was unable to stop-called on call provider and was instructed to hold Xarelto and proceed to ER if unable to stop bleeding.    Patient went to ER and now has nasal packing.  He is suppose to follow up with ENT in the next couple of days to have this removed.  He is calling to see when he needs to restart his Xarelto.     Per chart review: Leonie Man, MD  9:24 PM  Note    Agree with plan -- this would be the appropriate thing to do in the future -- after 24 hr, Xarelto is out of his Sx.  Can restart after 48-72 hours depending on amount of bleeding & cause.  [name]       Advised would route to Md to review and advise on restarting Xarelto.  He was instructed to have packing removed by ENT after 4-5 days (Wednesday or Thursday).

## 2017-10-29 NOTE — Telephone Encounter (Signed)
Pt c/o medication issue:  1. Name of Medication: Xarelto  2. How are you currently taking this medication (dosage and times per day)? Now not taking  3. Are you having a reaction (difficulty breathing--STAT)? no  4. What is your medication issue? Patient stated that he started bleeding on Saturday and went to the ER at 2:30 pm that Saturday. Dr. On call told him to stop taking that medication.

## 2017-10-30 ENCOUNTER — Other Ambulatory Visit: Payer: Self-pay | Admitting: Physician Assistant

## 2017-10-30 ENCOUNTER — Telehealth: Payer: Self-pay | Admitting: Cardiology

## 2017-10-30 NOTE — Telephone Encounter (Signed)
No note needed/kw °

## 2017-10-30 NOTE — Telephone Encounter (Signed)
Patient aware, ENT appt tomorrow.   Will call with update

## 2017-10-30 NOTE — Telephone Encounter (Signed)
Returned call to pt he has already stopped xarelto and informed him to call PCP and see if there is anything that they want to do. Pt states that he is having black stools. He did have a nose bleed Saturday informed him that is is probably from that but to call PCP just to make sure and see if there is anything else that needs to be done

## 2017-10-30 NOTE — Telephone Encounter (Signed)
°  Patient returning call to nurse.  Patient also states he has a new issue; blood in stool today. Today was his first bowel movement since Saturday.

## 2017-10-31 DIAGNOSIS — R040 Epistaxis: Secondary | ICD-10-CM | POA: Diagnosis not present

## 2017-11-02 ENCOUNTER — Ambulatory Visit (HOSPITAL_COMMUNITY): Payer: Self-pay

## 2017-11-02 ENCOUNTER — Encounter (HOSPITAL_COMMUNITY): Payer: PPO

## 2017-11-03 ENCOUNTER — Other Ambulatory Visit: Payer: Self-pay | Admitting: Physician Assistant

## 2017-11-05 ENCOUNTER — Encounter (HOSPITAL_COMMUNITY): Payer: Self-pay

## 2017-11-05 ENCOUNTER — Ambulatory Visit (HOSPITAL_COMMUNITY): Payer: Self-pay

## 2017-11-05 ENCOUNTER — Encounter (HOSPITAL_COMMUNITY)
Admission: RE | Admit: 2017-11-05 | Discharge: 2017-11-05 | Disposition: A | Discharge status: Home / Self Care | Payer: PPO | Source: Ambulatory Visit | Attending: Cardiology | Admitting: Cardiology

## 2017-11-05 DIAGNOSIS — Z951 Presence of aortocoronary bypass graft: Secondary | ICD-10-CM

## 2017-11-05 MED ORDER — ATORVASTATIN CALCIUM 80 MG PO TABS: 80.0000 mg | ORAL_TABLET | Freq: Every day | ORAL | 3 refills | Status: DC

## 2017-11-05 NOTE — Progress Notes (Signed)
Pt started cardiac rehab today.  Pt tolerated light exercise without difficulty. VSS, telemetry-sinus rhythm, frequent PVC bigeminal at times with couplets,  asymptomatic.  Medication list reconciled. Pt denies barriers to medicaiton compliance.  PSYCHOSOCIAL ASSESSMENT:  PHQ-0. Marland Kitchen Pt exhibits positive coping skills, hopeful outlook with supportive family. No psychosocial needs identified at this time, no psychosocial interventions necessary.    Pt oriented to exercise equipment and routine.    Understanding verbalized.  Andi Hence, RN, BSN Cardiac Pulmonary Rehab

## 2017-11-07 ENCOUNTER — Encounter (HOSPITAL_COMMUNITY)
Admission: RE | Admit: 2017-11-07 | Discharge: 2017-11-07 | Disposition: A | Discharge status: Home / Self Care | Payer: PPO | Source: Ambulatory Visit | Attending: Cardiology | Admitting: Cardiology

## 2017-11-07 ENCOUNTER — Ambulatory Visit (HOSPITAL_COMMUNITY): Payer: Self-pay

## 2017-11-07 DIAGNOSIS — Z951 Presence of aortocoronary bypass graft: Secondary | ICD-10-CM

## 2017-11-09 ENCOUNTER — Encounter (HOSPITAL_COMMUNITY)
Admission: RE | Admit: 2017-11-09 | Discharge: 2017-11-09 | Disposition: A | Discharge status: Home / Self Care | Payer: PPO | Source: Ambulatory Visit | Attending: Cardiology | Admitting: Cardiology

## 2017-11-09 ENCOUNTER — Ambulatory Visit (HOSPITAL_COMMUNITY): Payer: Self-pay

## 2017-11-09 DIAGNOSIS — Z951 Presence of aortocoronary bypass graft: Secondary | ICD-10-CM

## 2017-11-12 ENCOUNTER — Ambulatory Visit (INDEPENDENT_AMBULATORY_CARE_PROVIDER_SITE_OTHER): Payer: Self-pay | Admitting: Thoracic Surgery (Cardiothoracic Vascular Surgery)

## 2017-11-12 ENCOUNTER — Ambulatory Visit (HOSPITAL_COMMUNITY): Payer: Self-pay

## 2017-11-12 ENCOUNTER — Encounter: Payer: Self-pay | Admitting: Thoracic Surgery (Cardiothoracic Vascular Surgery)

## 2017-11-12 ENCOUNTER — Encounter (HOSPITAL_COMMUNITY)
Admission: RE | Admit: 2017-11-12 | Discharge: 2017-11-12 | Disposition: A | Discharge status: Home / Self Care | Payer: PPO | Source: Ambulatory Visit | Attending: Cardiology | Admitting: Cardiology

## 2017-11-12 ENCOUNTER — Other Ambulatory Visit: Payer: Self-pay

## 2017-11-12 VITALS — BP 140/72 | HR 59 | Resp 18 | Ht 71.0 in | Wt 209.8 lb

## 2017-11-12 DIAGNOSIS — Z951 Presence of aortocoronary bypass graft: Secondary | ICD-10-CM

## 2017-11-12 NOTE — Progress Notes (Signed)
[address]       [place] [address]             [phone]     CARDIOTHORACIC SURGERY OFFICE NOTE  Referring Provider is Leonie Man, MD PCP is Tisovec, Fransico Him, MD   HPI:  Patient is a 67 year old male with coronary artery disease, type 2 diabetes mellitus, hypertension, and hyperlipidemia who returns to the office today for routine follow-up status post coronary artery bypass grafting x4 on September 06, 2017 for severe three-vessel coronary artery disease with abnormal stress test and a single episode of substernal chest discomfort at rest suspicious for angina pectoris.  His early postoperative recovery was uneventful and he was last seen here in our office on as October 08, 2017 at which time he was doing well.  Since then he has been seen on several occasions with Dr. Ellyn Hack at [facility].  He recently underwent a 30-day event monitor and reportedly was found to have a brief episode of atrial fibrillation.  He was started on oral Xarelto and within a few days want up in the emergency room with severe epistaxis.  Xarelto was held and aspirin decreased to 81 mg daily.  He has now restarted taking Xarelto.  He otherwise reports that he is doing well.  He is walking more than a mile every day and he is actively participating in the cardiac rehab program.  He has no symptoms of exertional chest pain or chest tightness.  He has no shortness of breath.  Overall he seems to be recovering remarkably well from the surgery.    Current Outpatient Medications  Medication Sig Dispense Refill  . aspirin EC 81 MG tablet Take 1 tablet (81 mg total) by mouth daily. 90 tablet 3  . atorvastatin (LIPITOR) 80 MG tablet Take 1 tablet (80 mg total) by mouth daily at 6 PM. 90 tablet 3  . lisinopril (PRINIVIL,ZESTRIL) 10 MG tablet Take 1 tablet (10 mg total) by mouth daily. 30 tablet 1  . metoprolol succinate (TOPROL XL) 25 MG 24 hr tablet Take 1 tablet (25 mg total) by mouth  daily. 90 tablet 3  . metoprolol tartrate (LOPRESSOR) 25 MG tablet Take 1 tablet (25 mg total) by mouth as needed. TAKE FOR PROLONG INCREASE HEART RATE OR PALPATIONS 20 tablet 6  . Multiple Vitamin (MULTIVITAMIN) tablet Take 1 tablet by mouth daily.    . rivaroxaban (XARELTO) 20 MG TABS tablet Take 1 tablet (20 mg total) by mouth daily with supper. 30 tablet 6  . sitaGLIPtan-metformin (JANUMET) 50-500 MG per tablet Take 1 tablet by mouth 2 (two) times daily with a meal.      No current facility-administered medications for this visit.       Physical Exam:   BP 140/72 (BP Location: Left Arm, Patient Position: Sitting, Cuff Size: Normal)   Pulse (!) 59   Resp 18   Ht 5\' 11"  (1.803 m)   Wt 209 lb 12.8 oz (95.2 kg)   SpO2 98% Comment: RA  BMI 29.26 kg/m   General:  Well-appearing  Chest:   Clear to auscultation  CV:   Regular rate and rhythm without murmur  Incisions:  Healing nicely, sternum stable  Abdomen:  Soft nontender  Extremities:  Warm and well-perfused  Diagnostic Tests:  n/a   Impression:  Patient is doing well approximately 48-month status post coronary artery bypass grafting.  Plan:  We have not recommended any change the patient's  current medications.  I have encouraged the patient to continue to gradually increase his physical activity with his only limitation at this point remaining that he refrain from any heavy lifting or strenuous use of his arms or shoulders for probably another month.  All of his questions have been addressed.  He will continue to follow-up regularly with Dr. Ellyn Hack and return to our office for routine follow-up next summer, approximately 1 year following his surgery.  He will call during the interim should specific problems or questions arise.    Valentina Gu. Roxy Manns, MD 11/12/2017 4:10 PM

## 2017-11-12 NOTE — Patient Instructions (Addendum)
Continue all previous medications without any changes at this time  Continue to avoid any heavy lifting or strenuous use of your arms or shoulders for at least a total of three months from the time of surgery.  After three months you may gradually increase how much you lift or otherwise use your arms or chest as tolerated, with limits based upon whether or not activities lead to the return of significant discomfort.  

## 2017-11-14 ENCOUNTER — Ambulatory Visit (HOSPITAL_COMMUNITY): Payer: Self-pay

## 2017-11-14 ENCOUNTER — Encounter (HOSPITAL_COMMUNITY)
Admission: RE | Admit: 2017-11-14 | Discharge: 2017-11-14 | Disposition: A | Discharge status: Home / Self Care | Payer: PPO | Source: Ambulatory Visit | Attending: Cardiology | Admitting: Cardiology

## 2017-11-14 DIAGNOSIS — Z951 Presence of aortocoronary bypass graft: Secondary | ICD-10-CM | POA: Insufficient documentation

## 2017-11-14 DIAGNOSIS — I10 Essential (primary) hypertension: Secondary | ICD-10-CM | POA: Diagnosis not present

## 2017-11-14 DIAGNOSIS — E7800 Pure hypercholesterolemia, unspecified: Secondary | ICD-10-CM | POA: Diagnosis not present

## 2017-11-14 DIAGNOSIS — Z8249 Family history of ischemic heart disease and other diseases of the circulatory system: Secondary | ICD-10-CM | POA: Diagnosis not present

## 2017-11-14 DIAGNOSIS — Z7982 Long term (current) use of aspirin: Secondary | ICD-10-CM | POA: Diagnosis not present

## 2017-11-14 DIAGNOSIS — Z7901 Long term (current) use of anticoagulants: Secondary | ICD-10-CM | POA: Diagnosis not present

## 2017-11-14 DIAGNOSIS — Z79899 Other long term (current) drug therapy: Secondary | ICD-10-CM | POA: Insufficient documentation

## 2017-11-14 DIAGNOSIS — Z87442 Personal history of urinary calculi: Secondary | ICD-10-CM | POA: Diagnosis not present

## 2017-11-14 DIAGNOSIS — I2510 Atherosclerotic heart disease of native coronary artery without angina pectoris: Secondary | ICD-10-CM | POA: Diagnosis not present

## 2017-11-14 DIAGNOSIS — E119 Type 2 diabetes mellitus without complications: Secondary | ICD-10-CM | POA: Insufficient documentation

## 2017-11-14 DIAGNOSIS — Z7984 Long term (current) use of oral hypoglycemic drugs: Secondary | ICD-10-CM | POA: Insufficient documentation

## 2017-11-15 NOTE — Progress Notes (Signed)
Cardiac Individual Treatment Plan  Patient Details  Name: Taylor [name] MRN: [id number] Date of Birth: 1950-07-23 Referring Provider:   Flowsheet Row CARDIAC REHAB PHASE II ORIENTATION from 10/25/2017 in [facility]  Referring Provider  Leonie Man MD       Initial Encounter Date:  [place] PHASE II ORIENTATION from 10/25/2017 in [facility]  Date  10/25/17      Visit Diagnosis: S/P CABG x 4 09/06/17  Patient's Home Medications on Admission:  Current Outpatient Medications:  .  aspirin EC 81 MG tablet, Take 1 tablet (81 mg total) by mouth daily., Disp: 90 tablet, Rfl: 3 .  atorvastatin (LIPITOR) 80 MG tablet, Take 1 tablet (80 mg total) by mouth daily at 6 PM., Disp: 90 tablet, Rfl: 3 .  lisinopril (PRINIVIL,ZESTRIL) 10 MG tablet, Take 1 tablet (10 mg total) by mouth daily., Disp: 30 tablet, Rfl: 1 .  metoprolol succinate (TOPROL XL) 25 MG 24 hr tablet, Take 1 tablet (25 mg total) by mouth daily., Disp: 90 tablet, Rfl: 3 .  metoprolol tartrate (LOPRESSOR) 25 MG tablet, Take 1 tablet (25 mg total) by mouth as needed. TAKE FOR PROLONG INCREASE HEART RATE OR PALPATIONS, Disp: 20 tablet, Rfl: 6 .  Multiple Vitamin (MULTIVITAMIN) tablet, Take 1 tablet by mouth daily., Disp: , Rfl:  .  rivaroxaban (XARELTO) 20 MG TABS tablet, Take 1 tablet (20 mg total) by mouth daily with supper., Disp: 30 tablet, Rfl: 6 .  sitaGLIPtan-metformin (JANUMET) 50-500 MG per tablet, Take 1 tablet by mouth 2 (two) times daily with a meal. , Disp: , Rfl:   Past Medical History: Past Medical History:  Diagnosis Date  . Bigeminal rhythm 08/01/2017   per EKG 07-24-2017 in epic (per confirmation new since last ecg in 2007)  in care everywhere in epic ecg dated 2012 shows sinus brady rate 53 no pvc's --per pt asymptomatic and   . Bradycardia   . Coronary artery disease involving native coronary artery 08/2017   Abnormal Myoview  08/2017 (Abnormal EKG - Severe ST depressions, PVCs & nsVT). TID noted.  Moderate LAD territory Ischemic Perfusion defect --> Cath with extensive RCA (RPDA & RPAV), Cx & LAD disease --> CABG x 4  . Family history of premature coronary artery disease    Father - MI 51. Paternal uncle x 2 -1 with MI & other with CABG in 108s  . History of contusion    ED visit 08-04-2013 , 9 days past injury ,  dx concussion w/ LOC, per CT inferior frontal lobes contusion hemarrhage-- residual headache (08-01-2017  per pt residual resolved)  . History of kidney stones   . Hypercholesterolemia   . Hypertension   . Nephrolithiasis    bilateral nonobstructive per CT 07-24-2017  . S/P CABG x 4 09/06/2017   LIMA to LAD, SVG to OM2, Sequential SVG to PDA and RPL2, EVH via right thigh and leg  . Type 2 diabetes mellitus ([place])    followed by pcp  . Ureteral calculi    left  . Wears glasses   . Wears hearing aid in both ears     Tobacco Use: Social History   Tobacco Use  Smoking Status Never Smoker  Smokeless Tobacco Never Used    Labs: Recent Review Flowsheet Data    Labs for ITP Cardiac and Pulmonary Rehab Latest Ref Rng & Units 09/06/2017 09/06/2017 09/06/2017 09/06/2017 09/07/2017   Hemoglobin A1c 4.8 -  5.6 % - - - - -   PHART 7.350 - 7.450 7.354 7.351 7.339(L) - -   PCO2ART 32.0 - 48.0 mmHg 41.5 37.2 43.0 - -   HCO3 20.0 - 28.0 mmol/L 23.4 20.4 23.2 - -   TCO2 22 - 32 mmol/L 25 22 25 23 22    ACIDBASEDEF 0.0 - 2.0 mmol/L 2.0 5.0(H) 3.0(H) - -   O2SAT % 97.0 98.0 99.0 - -      Capillary Blood Glucose: Lab Results  Component Value Date   GLUCAP 129 (H) 09/11/2017   GLUCAP 130 (H) 09/10/2017   GLUCAP 101 (H) 09/10/2017   GLUCAP 195 (H) 09/10/2017   GLUCAP 129 (H) 09/10/2017     Exercise Target Goals: Exercise Program Goal: Individual exercise prescription set using results from initial 6 min walk test and THRR while considering  patient's activity barriers and safety.   Exercise Prescription  Goal: Initial exercise prescription builds to 30-45 minutes a day of aerobic activity, 2-3 days per week.  Home exercise guidelines will be given to patient during program as part of exercise prescription that the participant will acknowledge.  Activity Barriers & Risk Stratification: Activity Barriers & Cardiac Risk Stratification - 10/25/17 1145    Activity Barriers & Cardiac Risk Stratification          Activity Barriers  None    Cardiac Risk Stratification  High           6 Minute Walk: 6 Minute Walk    6 Minute Walk    Row Name 10/25/17 1144   Phase  Initial   Distance  1710 feet   Walk Time  6 minutes   # of Rest Breaks  0   MPH  3.24   METS  3.77   RPE  11   Perceived Dyspnea   0   VO2 Peak  13.2   Symptoms  No   Resting HR  63 bpm   Resting BP  110/70   Resting Oxygen Saturation   97 %   Exercise Oxygen Saturation  during 6 min walk  97 %   Max Ex. HR  89 bpm   Max Ex. BP  148/60   2 Minute Post BP  124/72          Oxygen Initial Assessment:   Oxygen Re-Evaluation:   Oxygen Discharge (Final Oxygen Re-Evaluation):   Initial Exercise Prescription: Initial Exercise Prescription - 10/25/17 1100    Date of Initial Exercise RX and Referring Provider          Date  10/25/17    Referring Provider  Leonie Man MD     Expected Discharge Date  01/18/18        Treadmill          MPH  3    Grade  1    Minutes  10    METs  3.71        Bike          Level  1.2    Minutes  10    METs  3.38        NuStep          Level  2    SPM  75    Minutes  10    METs  2.8        Prescription Details          Frequency (times per week)  3x    Duration  Progress to  30 minutes of continuous aerobic without signs/symptoms of physical distress        Intensity          THRR 40-80% of Max Heartrate  61-122    Ratings of Perceived Exertion  11-13    Perceived Dyspnea  0-4        Progression          Progression  Continue progressive overload as  per policy without signs/symptoms or physical distress.        Resistance Training          Training Prescription  Yes    Weight  3lbs    Reps  10-15           Perform Capillary Blood Glucose checks as needed.  Exercise Prescription Changes: Exercise Prescription Changes    Response to Exercise    Row Name 11/05/17 1413 11/14/17 1415   Blood Pressure (Admit)  120/60  124/78   Blood Pressure (Exercise)  158/68  168/66   Blood Pressure (Exit)  120/62  126/72   Heart Rate (Admit)  84 bpm  65 bpm   Heart Rate (Exercise)  115 bpm  115 bpm   Heart Rate (Exit)  80 bpm  65 bpm   Rating of Perceived Exertion (Exercise)  12  12   Perceived Dyspnea (Exercise)  0  0   Symptoms  None  None   Comments  Pt oriented to exercise equipment   no documentation   Duration  Progress to 30 minutes of  aerobic without signs/symptoms of physical distress  Continue with 30 min of aerobic exercise without signs/symptoms of physical distress.   Intensity  THRR unchanged  THRR unchanged       Progression    Row Name 11/05/17 1413 11/14/17 1415   Progression  Continue to progress workloads to maintain intensity without signs/symptoms of physical distress.  Continue to progress workloads to maintain intensity without signs/symptoms of physical distress.   Average METs  3.14  3.71       Resistance Training    Row Name 11/05/17 1413 11/14/17 1415   Training Prescription  Yes  No   Weight  3lbs  no documentation   Reps  10-15  no documentation       Interval Training    Row Name 11/05/17 1413 11/14/17 1415   Interval Training  No  No       Treadmill    Row Name 11/05/17 1413 11/14/17 1415   MPH  3  3.4   Grade  1  2   Minutes  10  10   METs  3.71  4.54       Bike    Row Name 11/05/17 1413 11/14/17 1415   Level  1.2  1.2   Minutes  10  10   METs  3.38  3.38       NuStep    Row Name 11/05/17 1413 11/14/17 1415   Level  2  4   SPM  75  95   Minutes  10  10   METs  2.3  3.2           Exercise Comments: Exercise Comments    Row Name 11/05/17 1412   Exercise Comments  Pt's first day of exericse. Pt oriented to exercise equipment. Pt responded well to exercise. Will continue to monitor.       Exercise Goals and Review: Exercise Goals    Exercise Goals  [place] Name 10/25/17 1145   Increase Physical Activity  Yes   Intervention  Develop an individualized exercise prescription for aerobic and resistive training based on initial evaluation findings, risk stratification, comorbidities and participant's personal goals.;Provide advice, education, support and counseling about physical activity/exercise needs.   Expected Outcomes  Short Term: Attend rehab on a regular basis to increase amount of physical activity.;Long Term: Add in home exercise to make exercise part of routine and to increase amount of physical activity.;Long Term: Exercising regularly at least 3-5 days a week.   Increase Strength and Stamina  Yes   Intervention  Develop an individualized exercise prescription for aerobic and resistive training based on initial evaluation findings, risk stratification, comorbidities and participant's personal goals.;Provide advice, education, support and counseling about physical activity/exercise needs.   Expected Outcomes  Short Term: Increase workloads from initial exercise prescription for resistance, speed, and METs.;Short Term: Perform resistance training exercises routinely during rehab and add in resistance training at home;Long Term: Improve cardiorespiratory fitness, muscular endurance and strength as measured by increased METs and functional capacity (6MWT)   Able to understand and use rate of perceived exertion (RPE) scale  Yes   Intervention  Provide education and explanation on how to use RPE scale   Expected Outcomes  Short Term: Able to use RPE daily in rehab to express subjective intensity level;Long Term:  Able to use RPE to guide intensity level when exercising  independently   Knowledge and understanding of Target Heart Rate Range (THRR)  Yes   Intervention  Provide education and explanation of THRR including how the numbers were predicted and where they are located for reference   Expected Outcomes  Short Term: Able to state/look up THRR;Long Term: Able to use THRR to govern intensity when exercising independently;Short Term: Able to use daily as guideline for intensity in rehab   Able to check pulse independently  Yes   Intervention  Review the importance of being able to check your own pulse for safety during independent exercise;Provide education and demonstration on how to check pulse in carotid and radial arteries.   Expected Outcomes  Short Term: Able to explain why pulse checking is important during independent exercise;Long Term: Able to check pulse independently and accurately   Understanding of Exercise Prescription  Yes   Intervention  Provide education, explanation, and written materials on patient's individual exercise prescription   Expected Outcomes  Short Term: Able to explain program exercise prescription;Long Term: Able to explain home exercise prescription to exercise independently   Improve claudication pain toleration; Improve walking ability  Yes   Intervention  Participate in PAD/SET Rehab 2-3 days a week, walking at home as part of exercise prescription;Attend education sessions to aid in risk factor modification and understanding of disease process   Expected Outcomes  Short Term: Improve walking distance/time to onset of claudication pain;Long Term: Improve score of PAD questionnaires;Long Term: Improve walking ability and toleration to claudication          Exercise Goals Re-Evaluation :   Discharge Exercise Prescription (Final Exercise Prescription Changes): Exercise Prescription Changes - 11/14/17 1415    Response to Exercise          Blood Pressure (Admit)  124/78    Blood Pressure (Exercise)  168/66    Blood  Pressure (Exit)  126/72    Heart Rate (Admit)  65 bpm    Heart Rate (Exercise)  115 bpm    Heart Rate (Exit)  65 bpm    Rating  of Perceived Exertion (Exercise)  12    Perceived Dyspnea (Exercise)  0    Symptoms  None    Duration  Continue with 30 min of aerobic exercise without signs/symptoms of physical distress.    Intensity  THRR unchanged        Progression          Progression  Continue to progress workloads to maintain intensity without signs/symptoms of physical distress.    Average METs  3.71        Resistance Training          Training Prescription  No        Interval Training          Interval Training  No        Treadmill          MPH  3.4    Grade  2    Minutes  10    METs  4.54        Bike          Level  1.2    Minutes  10    METs  3.38        NuStep          Level  4    SPM  95    Minutes  10    METs  3.2           Nutrition:  Target Goals: Understanding of nutrition guidelines, daily intake of sodium 1500mg , cholesterol 200mg , calories 30% from fat and 7% or less from saturated fats, daily to have 5 or more servings of fruits and vegetables.  Biometrics: Pre Biometrics - 10/25/17 1145    Pre Biometrics          Height  5\' 11"  (1.803 m)    Weight  93.4 kg    Waist Circumference  42 inches    Hip Circumference  40 inches    Waist to Hip Ratio  1.05 %    BMI (Calculated)  28.73    Triceps Skinfold  16 mm    % Body Fat  28.8 %    Grip Strength  43 kg    Flexibility  10.5 in    Single Leg Stand  2.47 seconds            Nutrition Therapy Plan and Nutrition Goals: Nutrition Therapy & Goals - 10/25/17 0838    Nutrition Therapy          Diet  carb modified, heart healthy        Personal Nutrition Goals          Nutrition Goal  Pt to identify and limit food sources of saturated fat, trans fat, refined carbohydrates and sodium    Personal Goal #2  Improved blood glucose control as evidenced by pt's A1c trending from 7.6  toward less than 7.0.    Personal Goal #3  Pt able to name foods that affect blood glucose        Intervention Plan          Intervention  Prescribe, educate and counsel regarding individualized specific dietary modifications aiming towards targeted core components such as weight, hypertension, lipid management, diabetes, heart failure and other comorbidities.    Expected Outcomes  Short Term Goal: Understand basic principles of dietary content, such as calories, fat, sodium, cholesterol and nutrients.;Long Term Goal: Adherence to prescribed nutrition plan.           Nutrition Assessments: Nutrition  Assessments - 10/25/17 0839    MEDFICTS Scores          Pre Score  32           Nutrition Goals Re-Evaluation:   Nutrition Goals Re-Evaluation:   Nutrition Goals Discharge (Final Nutrition Goals Re-Evaluation):   Psychosocial: Target Goals: Acknowledge presence or absence of significant depression and/or stress, maximize coping skills, provide positive support system. Participant is able to verbalize types and ability to use techniques and skills needed for reducing stress and depression.  Initial Review & Psychosocial Screening: Initial Psych Review & Screening - 10/25/17 1233    Initial Review          Current issues with  None Identified        Family Dynamics          Good Support System?  Yes   Pershing has his wife and children for support       Barriers          Psychosocial barriers to participate in program  There are no identifiable barriers or psychosocial needs.        Screening Interventions          Interventions  Encouraged to exercise           Quality of Life Scores: Quality of Life - 10/25/17 1141    Quality of Life          Select  Quality of Life        Quality of Life Scores          Health/Function Pre  27.07 %    Socioeconomic Pre  26.86 %    Psych/Spiritual Pre  24.93 %    Family Pre  27.6 %    GLOBAL Pre  26.66 %           Scores of 19 and below usually indicate a poorer quality of life in these areas.  A difference of  2-3 points is a clinically meaningful difference.  A difference of 2-3 points in the total score of the Quality of Life Index has been associated with significant improvement in overall quality of life, self-image, physical symptoms, and general health in studies assessing change in quality of life.  PHQ-9: Recent Review Flowsheet Data    Depression screen [facility] 2/9 11/05/2017   Decreased Interest 0   Down, Depressed, Hopeless 0   PHQ - 2 Score 0     Interpretation of Total Score  Total Score Depression Severity:  1-4 = Minimal depression, 5-9 = Mild depression, 10-14 = Moderate depression, 15-19 = Moderately severe depression, 20-27 = Severe depression   Psychosocial Evaluation and Intervention: Psychosocial Evaluation - 11/05/17 1505    Psychosocial Evaluation & Interventions          Interventions  Encouraged to exercise with the program and follow exercise prescription    Comments  no psychosocial needs identified, no interventions necessary. pt enjoys riding his motorcylce and is eager to resume this activity.     Expected Outcomes  pt will exhibit positive outlook with good coping skills.     Continue Psychosocial Services   No Follow up required           Psychosocial Re-Evaluation: Psychosocial Re-Evaluation    Psychosocial Re-Evaluation    Row Name 11/15/17 1516   Current issues with  None Identified   Comments  no psychosocial needs identified, no interventions necessary    Expected Outcomes  pt will exhibit positie outlook  with good coping skills.    Interventions  Encouraged to attend Cardiac Rehabilitation for the exercise   Continue Psychosocial Services   No Follow up required          Psychosocial Discharge (Final Psychosocial Re-Evaluation): Psychosocial Re-Evaluation - 11/15/17 1516    Psychosocial Re-Evaluation          Current issues with  None  Identified    Comments  no psychosocial needs identified, no interventions necessary     Expected Outcomes  pt will exhibit positie outlook with good coping skills.     Interventions  Encouraged to attend Cardiac Rehabilitation for the exercise    Continue Psychosocial Services   No Follow up required           Vocational Rehabilitation: Provide vocational rehab assistance to qualifying candidates.   Vocational Rehab Evaluation & Intervention: Vocational Rehab - 10/25/17 1235    Initial Vocational Rehab Evaluation & Intervention          Assessment shows need for Vocational Rehabilitation  No           Education: Education Goals: Education classes will be provided on a weekly basis, covering required topics. Participant will state understanding/return demonstration of topics presented.  Learning Barriers/Preferences: Learning Barriers/Preferences - 10/25/17 1141    Learning Barriers/Preferences          Learning Barriers  Sight    Learning Preferences  Verbal Instruction;Individual Instruction;Pictoral           Education Topics: Count Your Pulse:  -Group instruction provided by verbal instruction, demonstration, patient participation and written materials to support subject.  Instructors address importance of being able to find your pulse and how to count your pulse when at home without a heart monitor.  Patients get hands on experience counting their pulse with staff help and individually.   Heart Attack, Angina, and Risk Factor Modification:  -Group instruction provided by verbal instruction, video, and written materials to support subject.  Instructors address signs and symptoms of angina and heart attacks.    Also discuss risk factors for heart disease and how to make changes to improve heart health risk factors. Flowsheet Row CARDIAC REHAB PHASE II EXERCISE from 11/14/2017 in [facility]  Date  11/14/17  Instruction Review Code  2-  Demonstrated Understanding      Functional Fitness:  -Group instruction provided by verbal instruction, demonstration, patient participation, and written materials to support subject.  Instructors address safety measures for doing things around the house.  Discuss how to get up and down off the floor, how to pick things up properly, how to safely get out of a chair without assistance, and balance training.   Meditation and Mindfulness:  -Group instruction provided by verbal instruction, patient participation, and written materials to support subject.  Instructor addresses importance of mindfulness and meditation practice to help reduce stress and improve awareness.  Instructor also leads participants through a meditation exercise.    Stretching for Flexibility and Mobility:  -Group instruction provided by verbal instruction, patient participation, and written materials to support subject.  Instructors lead participants through series of stretches that are designed to increase flexibility thus improving mobility.  These stretches are additional exercise for major muscle groups that are typically performed during regular warm up and cool down.   Hands Only CPR:  -Group verbal, video, and participation provides a basic overview of AHA guidelines for community CPR. Role-play of emergencies allow participants the opportunity to practice  calling for help and chest compression technique with discussion of AED use.   Hypertension: -Group verbal and written instruction that provides a basic overview of hypertension including the most recent diagnostic guidelines, risk factor reduction with self-care instructions and medication management.    Nutrition I class: Heart Healthy Eating:  -Group instruction provided by PowerPoint slides, verbal discussion, and written materials to support subject matter. The instructor gives an explanation and review of the Therapeutic Lifestyle Changes diet  recommendations, which includes a discussion on lipid goals, dietary fat, sodium, fiber, plant stanol/sterol esters, sugar, and the components of a well-balanced, healthy diet.   Nutrition II class: Lifestyle Skills:  -Group instruction provided by PowerPoint slides, verbal discussion, and written materials to support subject matter. The instructor gives an explanation and review of label reading, grocery shopping for heart health, heart healthy recipe modifications, and ways to make healthier choices when eating out.   Diabetes Question & Answer:  -Group instruction provided by PowerPoint slides, verbal discussion, and written materials to support subject matter. The instructor gives an explanation and review of diabetes co-morbidities, pre- and post-prandial blood glucose goals, pre-exercise blood glucose goals, signs, symptoms, and treatment of hypoglycemia and hyperglycemia, and foot care basics.   Diabetes Blitz:  -Group instruction provided by PowerPoint slides, verbal discussion, and written materials to support subject matter. The instructor gives an explanation and review of the physiology behind type 1 and type 2 diabetes, diabetes medications and rational behind using different medications, pre- and post-prandial blood glucose recommendations and Hemoglobin A1c goals, diabetes diet, and exercise including blood glucose guidelines for exercising safely.    Portion Distortion:  -Group instruction provided by PowerPoint slides, verbal discussion, written materials, and food models to support subject matter. The instructor gives an explanation of serving size versus portion size, changes in portions sizes over the last 20 years, and what consists of a serving from each food group.   Stress Management:  -Group instruction provided by verbal instruction, video, and written materials to support subject matter.  Instructors review role of stress in heart disease and how to cope with stress  positively.     Exercising on Your Own:  -Group instruction provided by verbal instruction, power point, and written materials to support subject.  Instructors discuss benefits of exercise, components of exercise, frequency and intensity of exercise, and end points for exercise.  Also discuss use of nitroglycerin and activating EMS.  Review options of places to exercise outside of rehab.  Review guidelines for sex with heart disease.   Cardiac Drugs I:  -Group instruction provided by verbal instruction and written materials to support subject.  Instructor reviews cardiac drug classes: antiplatelets, anticoagulants, beta blockers, and statins.  Instructor discusses reasons, side effects, and lifestyle considerations for each drug class.   Cardiac Drugs II:  -Group instruction provided by verbal instruction and written materials to support subject.  Instructor reviews cardiac drug classes: angiotensin converting enzyme inhibitors (ACE-I), angiotensin II receptor blockers (ARBs), nitrates, and calcium channel blockers.  Instructor discusses reasons, side effects, and lifestyle considerations for each drug class.   Anatomy and Physiology of the Circulatory System:  Group verbal and written instruction and models provide basic cardiac anatomy and physiology, with the coronary electrical and arterial systems. Review of: AMI, Angina, Valve disease, Heart Failure, Peripheral Artery Disease, Cardiac Arrhythmia, Pacemakers, and the ICD. Flowsheet Row CARDIAC REHAB PHASE II EXERCISE from 11/14/2017 in [place]  Date  11/07/17  Instruction Review Code  2- Demonstrated Understanding      Other Education:  -Group or individual verbal, written, or video instructions that support the educational goals of the cardiac rehab program.   Holiday Eating Survival Tips:  -Group instruction provided by PowerPoint slides, verbal discussion, and written materials to support subject  matter. The instructor gives patients tips, tricks, and techniques to help them not only survive but enjoy the holidays despite the onslaught of food that accompanies the holidays.   Knowledge Questionnaire Score: Knowledge Questionnaire Score - 10/25/17 1139    Knowledge Questionnaire Score          Pre Score  21/24           Core Components/Risk Factors/Patient Goals at Admission: Personal Goals and Risk Factors at Admission - 10/25/17 1143    Core Components/Risk Factors/Patient Goals on Admission           Weight Management  Yes;Weight Loss;Obesity    Intervention  Weight Management: Develop a combined nutrition and exercise program designed to reach desired caloric intake, while maintaining appropriate intake of nutrient and fiber, sodium and fats, and appropriate energy expenditure required for the weight goal.;Weight Management: Provide education and appropriate resources to help participant work on and attain dietary goals.;Weight Management/Obesity: Establish reasonable short term and long term weight goals.;Obesity: Provide education and appropriate resources to help participant work on and attain dietary goals.    Admit Weight  205 lb 14.6 oz (93.4 kg)    Goal Weight: Short Term  200 lb (90.7 kg)    Goal Weight: Long Term  190 lb (86.2 kg)    Expected Outcomes  Short Term: Continue to assess and modify interventions until short term weight is achieved;Long Term: Adherence to nutrition and physical activity/exercise program aimed toward attainment of established weight goal;Weight Loss: Understanding of general recommendations for a balanced deficit meal plan, which promotes 1-2 lb weight loss per week and includes a negative energy balance of [phone] kcal/d;Understanding recommendations for meals to include 15-35% energy as protein, 25-35% energy from fat, 35-60% energy from carbohydrates, less than 200mg  of dietary cholesterol, 20-35 gm of total fiber daily;Understanding of  distribution of calorie intake throughout the day with the consumption of 4-5 meals/snacks    Diabetes  Yes    Intervention  Provide education about signs/symptoms and action to take for hypo/hyperglycemia.;Provide education about proper nutrition, including hydration, and aerobic/resistive exercise prescription along with prescribed medications to achieve blood glucose in normal ranges: Fasting glucose 65-99 mg/dL    Expected Outcomes  Long Term: Attainment of HbA1C < 7%.;Short Term: Participant verbalizes understanding of the signs/symptoms and immediate care of hyper/hypoglycemia, proper foot care and importance of medication, aerobic/resistive exercise and nutrition plan for blood glucose control.    Hypertension  Yes    Intervention  Provide education on lifestyle modifcations including regular physical activity/exercise, weight management, moderate sodium restriction and increased consumption of fresh fruit, vegetables, and low fat dairy, alcohol moderation, and smoking cessation.;Monitor prescription use compliance.    Expected Outcomes  Short Term: Continued assessment and intervention until BP is < 140/55mm HG in hypertensive participants. < 130/87mm HG in hypertensive participants with diabetes, heart failure or chronic kidney disease.;Long Term: Maintenance of blood pressure at goal levels.    Lipids  Yes    Intervention  Provide education and support for participant on nutrition & aerobic/resistive exercise along with prescribed medications to achieve LDL 70mg , HDL >40mg .    Expected Outcomes  Short Term: Participant states understanding of desired  cholesterol values and is compliant with medications prescribed. Participant is following exercise prescription and nutrition guidelines.;Long Term: Cholesterol controlled with medications as prescribed, with individualized exercise RX and with personalized nutrition plan. Value goals: LDL < 70mg , HDL > 40 mg.           Core Components/Risk  Factors/Patient Goals Review:  Goals and Risk Factor Review    Core Components/Risk Factors/Patient Goals Review    Row Name 11/05/17 1509   Personal Goals Review  Weight Management/Obesity;Diabetes;Hypertension;Lipids   Review  pt with multiple CAD RF demonstrates eagerness to participate in CR program. pt personal goal is to resume previous activities including riding his [employer] motorcycle. pt is following MD recommendation and is not riding.  pt recent nosebleed hsa further decreased his strength/stamina.  pt eager to regain energy.    Expected Outcomes  pt will participate in CR exercise, nutrition and lifestyle modification opportunities to reduce overall RF.           Core Components/Risk Factors/Patient Goals at Discharge (Final Review):  Goals and Risk Factor Review - 11/05/17 1509    Core Components/Risk Factors/Patient Goals Review          Personal Goals Review  Weight Management/Obesity;Diabetes;Hypertension;Lipids    Review  pt with multiple CAD RF demonstrates eagerness to participate in CR program. pt personal goal is to resume previous activities including riding his [employer] motorcycle. pt is following MD recommendation and is not riding.  pt recent nosebleed hsa further decreased his strength/stamina.  pt eager to regain energy.     Expected Outcomes  pt will participate in CR exercise, nutrition and lifestyle modification opportunities to reduce overall RF.            ITP Comments: ITP Comments    Row Name 10/25/17 1135 11/05/17 1213 11/15/17 1515   ITP Comments  Dr. Fransico Him, Medical Director   pt started group exercise today. pt tolerated light activity without difficulty. pt oriented to exercise equipment and safety routine.   30 day ITP review.        Comments:

## 2017-11-16 ENCOUNTER — Ambulatory Visit (HOSPITAL_COMMUNITY): Payer: Self-pay

## 2017-11-16 ENCOUNTER — Encounter (HOSPITAL_COMMUNITY): Payer: PPO

## 2017-11-19 ENCOUNTER — Encounter (HOSPITAL_COMMUNITY)
Admission: RE | Admit: 2017-11-19 | Discharge: 2017-11-19 | Disposition: A | Discharge status: Home / Self Care | Payer: PPO | Source: Ambulatory Visit | Attending: Cardiology | Admitting: Cardiology

## 2017-11-19 ENCOUNTER — Ambulatory Visit (HOSPITAL_COMMUNITY): Payer: Self-pay

## 2017-11-19 DIAGNOSIS — Z951 Presence of aortocoronary bypass graft: Secondary | ICD-10-CM

## 2017-11-20 DIAGNOSIS — R040 Epistaxis: Secondary | ICD-10-CM | POA: Diagnosis not present

## 2017-11-21 ENCOUNTER — Encounter (HOSPITAL_COMMUNITY): Payer: PPO

## 2017-11-21 ENCOUNTER — Encounter (HOSPITAL_COMMUNITY): Payer: Self-pay | Admitting: Emergency Medicine

## 2017-11-21 ENCOUNTER — Other Ambulatory Visit: Payer: Self-pay | Admitting: Otolaryngology

## 2017-11-21 ENCOUNTER — Telehealth: Payer: Self-pay

## 2017-11-21 ENCOUNTER — Other Ambulatory Visit: Payer: Self-pay

## 2017-11-21 ENCOUNTER — Telehealth (HOSPITAL_COMMUNITY): Payer: Self-pay | Admitting: Internal Medicine

## 2017-11-21 ENCOUNTER — Ambulatory Visit (HOSPITAL_COMMUNITY): Payer: Self-pay

## 2017-11-21 ENCOUNTER — Emergency Department (HOSPITAL_COMMUNITY)
Admission: EM | Admit: 2017-11-21 | Discharge: 2017-11-21 | Disposition: A | Discharge status: Home / Self Care | Payer: PPO | Attending: Emergency Medicine | Admitting: Emergency Medicine

## 2017-11-21 DIAGNOSIS — R040 Epistaxis: Secondary | ICD-10-CM | POA: Insufficient documentation

## 2017-11-21 DIAGNOSIS — Z7982 Long term (current) use of aspirin: Secondary | ICD-10-CM | POA: Insufficient documentation

## 2017-11-21 DIAGNOSIS — Z79899 Other long term (current) drug therapy: Secondary | ICD-10-CM | POA: Diagnosis not present

## 2017-11-21 LAB — COMPREHENSIVE METABOLIC PANEL
ALT: 22 U/L (ref 0–44)
AST: 22 U/L (ref 15–41)
Albumin: 4 g/dL (ref 3.5–5.0)
Alkaline Phosphatase: 83 U/L (ref 38–126)
Anion gap: 7 (ref 5–15)
BUN: 21 mg/dL (ref 8–23)
CO2: 25 mmol/L (ref 22–32)
Calcium: 9.2 mg/dL (ref 8.9–10.3)
Chloride: 106 mmol/L (ref 98–111)
Creatinine, Ser: 1.29 mg/dL — ABNORMAL HIGH (ref 0.61–1.24)
GFR calc Af Amer: >60 mL/min (ref >60)
GFR calc non Af Amer: 56 mL/min — ABNORMAL LOW (ref >60)
Glucose, Bld: 165 mg/dL — ABNORMAL HIGH (ref 70–99)
Potassium: 4.4 mmol/L (ref 3.5–5.1)
Sodium: 138 mmol/L (ref 135–145)
Total Bilirubin: 0.7 mg/dL (ref 0.3–1.2)
Total Protein: 6.8 g/dL (ref 6.5–8.1)

## 2017-11-21 LAB — CBC WITH DIFFERENTIAL/PLATELET
Abs Immature Granulocytes: 0.02 10*3/uL (ref 0.00–0.07)
Basophils Absolute: 0 10*3/uL (ref 0.0–0.1)
Basophils Relative: 0 %
Eosinophils Absolute: 0.2 10*3/uL (ref 0.0–0.5)
Eosinophils Relative: 2 %
HCT: 35.8 % — ABNORMAL LOW (ref 39.0–52.0)
Hemoglobin: 11.1 g/dL — ABNORMAL LOW (ref 13.0–17.0)
Immature Granulocytes: 0 %
Lymphocytes Relative: 23 %
Lymphs Abs: 1.7 10*3/uL (ref 0.7–4.0)
MCH: 27.8 pg (ref 26.0–34.0)
MCHC: 31 g/dL (ref 30.0–36.0)
MCV: 89.7 fL (ref 80.0–100.0)
Monocytes Absolute: 0.5 10*3/uL (ref 0.1–1.0)
Monocytes Relative: 6 %
Neutro Abs: 5 10*3/uL (ref 1.7–7.7)
Neutrophils Relative %: 69 %
Platelets: 304 10*3/uL (ref 150–400)
RBC: 3.99 MIL/uL — ABNORMAL LOW (ref 4.22–5.81)
RDW: 14.1 % (ref 11.5–15.5)
WBC: 7.3 10*3/uL (ref 4.0–10.5)
nRBC: 0 % (ref 0.0–0.2)

## 2017-11-21 LAB — PROTIME-INR
INR: 1.87
Prothrombin Time: 21.4 seconds — ABNORMAL HIGH (ref 11.4–15.2)

## 2017-11-21 MED ORDER — OXYMETAZOLINE HCL 0.05 % NA SOLN
1.0000 | Freq: Once | NASAL | Status: AC
  Administered 2017-11-21: 1 via NASAL
  Filled 2017-11-21: qty 15

## 2017-11-21 MED ORDER — OXYMETAZOLINE HCL 0.05 % NA SOLN: 1.0000 | Freq: Once | NASAL | Status: DC

## 2017-11-21 NOTE — ED Triage Notes (Signed)
Patient reports right side epistaxis onset yesterday seen by his ENT yesterday (cauterized at clinic) , bleeding started again this evening , he is taking Xarelto .

## 2017-11-21 NOTE — Discharge Instructions (Addendum)
You may use nasal saline to keep your mucous membranes moist. You may use a humidifier.  Other than nasal saline, please do not put anything into your nose for the next 3-4 days. Please do not blow your nose for the next 3-4 days. If your nose begins to bleed again, at this time it is okay to blow your nose and blow out all of the clots and then spray Afrin nasal spray into both nostrils and hold direct pressure for 30 minutes without stopping. If this does not stop the bleeding, please return to the hospital.  

## 2017-11-21 NOTE — ED Provider Notes (Signed)
TIME SEEN: 5:21 AM  CHIEF COMPLAINT: Right-sided nosebleed  HPI: Patient is a 67 year old male with history of CAD, hypertension, hyperlipidemia on Xarelto who presents to the emergency department with right-sided nosebleed.  States he was seen by Dr. Benjamine Mola with ENT yesterday and had an area in his nose cauterized.  States the bleeding stopped but then started again around 6 PM last night.  He could not get the bleeding to stop despite pressure and Afrin.  No injury to the nose.  He has had previous nosebleeds in the past.  ROS: See HPI Constitutional: no fever  Eyes: no drainage  ENT: no runny nose   Cardiovascular:  no chest pain  Resp: no SOB  GI: no vomiting GU: no dysuria Integumentary: no rash  Allergy: no hives  Musculoskeletal: no leg swelling  Neurological: no slurred speech ROS otherwise negative  PAST MEDICAL HISTORY/PAST SURGICAL HISTORY:  Past Medical History:  Diagnosis Date  . Bigeminal rhythm 08/01/2017   per EKG 07-24-2017 in epic (per confirmation new since last ecg in 2007)  in care everywhere in epic ecg dated 2012 shows sinus brady rate 53 no pvc's --per pt asymptomatic and   . Bradycardia   . Coronary artery disease involving native coronary artery 08/2017   Abnormal Myoview 08/2017 (Abnormal EKG - Severe ST depressions, PVCs & nsVT). TID noted.  Moderate LAD territory Ischemic Perfusion defect --> Cath with extensive RCA (RPDA & RPAV), Cx & LAD disease --> CABG x 4  . Family history of premature coronary artery disease    Father - MI 82. Paternal uncle x 2 -1 with MI & other with CABG in 66s  . History of contusion    ED visit 08-04-2013 , 9 days past injury ,  dx concussion w/ LOC, per CT inferior frontal lobes contusion hemarrhage-- residual headache (08-01-2017  per pt residual resolved)  . History of kidney stones   . Hypercholesterolemia   . Hypertension   . Nephrolithiasis    bilateral nonobstructive per CT 07-24-2017  . S/P CABG x 4 09/06/2017   LIMA  to LAD, SVG to OM2, Sequential SVG to PDA and RPL2, EVH via right thigh and leg  . Type 2 diabetes mellitus ([place])    followed by pcp  . Ureteral calculi    left  . Wears glasses   . Wears hearing aid in both ears     MEDICATIONS:  Prior to Admission medications   Medication Sig Start Date End Date Taking? Authorizing Provider  aspirin EC 81 MG tablet Take 1 tablet (81 mg total) by mouth daily. 10/25/17   Leonie Man, MD  atorvastatin (LIPITOR) 80 MG tablet Take 1 tablet (80 mg total) by mouth daily at 6 PM. 11/05/17   Leonie Man, MD  lisinopril (PRINIVIL,ZESTRIL) 10 MG tablet Take 1 tablet (10 mg total) by mouth daily. 10/08/17   Nani Skillern, PA-C  metoprolol succinate (TOPROL XL) 25 MG 24 hr tablet Take 1 tablet (25 mg total) by mouth daily. 10/10/17   Almyra Deforest, PA  metoprolol tartrate (LOPRESSOR) 25 MG tablet Take 1 tablet (25 mg total) by mouth as needed. TAKE FOR PROLONG INCREASE HEART RATE OR PALPATIONS 10/22/17 01/20/18  Leonie Man, MD  Multiple Vitamin (MULTIVITAMIN) tablet Take 1 tablet by mouth daily.    [provider]  rivaroxaban (XARELTO) 20 MG TABS tablet Take 1 tablet (20 mg total) by mouth daily with supper. 10/22/17   Leonie Man, MD  sitaGLIPtan-metformin (  JANUMET) 50-500 MG per tablet Take 1 tablet by mouth 2 (two) times daily with a meal.     [provider]    ALLERGIES:  No Known Allergies  SOCIAL HISTORY:  Social History   Tobacco Use  . Smoking status: Never Smoker  . Smokeless tobacco: Never Used  Substance Use Topics  . Alcohol use: No    FAMILY HISTORY: Family History  Problem Relation Age of Onset  . Stroke Mother   . Atrial fibrillation Mother   . Clotting disorder Father   . Heart attack Father 58       Died from complications  . Diabetes Daughter   . Heart disease Other   . Stroke Other   . Heart attack Paternal Uncle        In his 17s  . CAD Paternal Uncle        Has had either bypass surgery or  stents    EXAM: BP 136/70   Pulse 66   Temp 97.7 F (36.5 C) (Oral)   Resp 16   SpO2 100%  CONSTITUTIONAL: Alert and oriented and responds appropriately to questions. Well-appearing; well-nourished HEAD: Normocephalic EYES: Conjunctivae clear, pupils appear equal, EOMI ENT: normal nose; moist mucous membranes, large amount of clot and active bleeding from the right nostril, no blood from the left nostril, blood in the posterior oropharynx NECK: Supple, no meningismus, no nuchal rigidity, no LAD  CARD: RRR; S1 and S2 appreciated; no murmurs, no clicks, no rubs, no gallops RESP: Normal chest excursion without splinting or tachypnea; breath sounds clear and equal bilaterally; no wheezes, no rhonchi, no rales, no hypoxia or respiratory distress, speaking full sentences ABD/GI: Normal bowel sounds; non-distended; soft, non-tender, no rebound, no guarding, no peritoneal signs, no hepatosplenomegaly BACK:  The back appears normal and is non-tender to palpation, there is no CVA tenderness EXT: Normal ROM in all joints; non-tender to palpation; no edema; normal capillary refill; no cyanosis, no calf tenderness or swelling    SKIN: Normal color for age and race; warm; no rash NEURO: Moves all extremities equally PSYCH: The patient's mood and manner are appropriate. Grooming and personal hygiene are appropriate.  MEDICAL DECISION MAKING: Patient here with right-sided nosebleed.  We have had him remove all of the clot from his nose by blowing his nose and sprayed copious amounts of Afrin in his nose.  He held pressure for 30 minutes and bleeding seems to have stopped.  We will continue to monitor in the ED.  ED PROGRESS: Patient has no further bleeding and has been monitored for an hour after bleeding has stopped.  No blood in the posterior oropharynx.  He will follow-up with his ENT physician.  Discussed return precautions.  Discussed supportive care instructions if he were to start bleeding  again.  At this time, I do not feel there is any life-threatening condition present. I have reviewed and discussed all results (EKG, imaging, lab, urine as appropriate) and exam findings with patient/family. I have reviewed nursing notes and appropriate previous records.  I feel the patient is safe to be discharged home without further emergent workup and can continue workup as an outpatient as needed. Discussed usual and customary return precautions. Patient/family verbalize understanding and are comfortable with this plan.  Outpatient follow-up has been provided if needed. All questions have been answered.   Marland KitchenEpistaxis Management Date/Time: 11/21/2017 6:47 AM Performed by: Latoya Diskin, Delice Bison, DO Authorized by: Dimitrios Balestrieri, Delice Bison, DO   Consent:    Consent  obtained:  Verbal   Consent given by:  Patient   Risks discussed:  Bleeding, infection, nasal injury and pain   Alternatives discussed:  Alternative treatment Anesthesia (see MAR for exact dosages):    Anesthesia method:  None Procedure details:    Treatment site:  R anterior   Repair method: Afrin and direct pressure.   Treatment complexity:  Extensive   Treatment episode: initial   Post-procedure details:    Assessment:  Bleeding stopped   Patient tolerance of procedure:  Tolerated well, no immediate complications      Kelissa Merlin, Delice Bison, DO 11/21/17 1216

## 2017-11-21 NOTE — ED Notes (Signed)
Pt ambulated to the bathroom independently with a steady gait.  

## 2017-11-21 NOTE — Telephone Encounter (Signed)
   [facility] Pre-operative Risk Assessment    Request for surgical clearance:  1. What type of surgery is being performed? Nasal Electrocavtery  2. When is this surgery scheduled? 11/29/17  3. What type of clearance is required (medical clearance vs. Pharmacy clearance to hold med vs. Both)? Both  4. Are there any medications that need to be held prior to surgery and how long? Xarelto  5. Practice name and name of physician performing surgery? [facility]   Dr.Teoh  6. What is your office phone number [phone]   7.   What is your office fax number [phone]  8.   Anesthesia type (None, local, MAC, general) ? Not listed   Kathyrn Lass 11/21/2017, 4:30 PM  _________________________________________________________________   (provider comments below)

## 2017-11-22 ENCOUNTER — Encounter (HOSPITAL_BASED_OUTPATIENT_CLINIC_OR_DEPARTMENT_OTHER): Payer: Self-pay | Admitting: *Deleted

## 2017-11-22 ENCOUNTER — Other Ambulatory Visit: Payer: Self-pay

## 2017-11-22 DIAGNOSIS — Z23 Encounter for immunization: Secondary | ICD-10-CM | POA: Diagnosis not present

## 2017-11-22 NOTE — Progress Notes (Signed)
Pt states Dr. Deeann Saint office told him they would call him back after talking to cardiologist regarding Xarelto prior to surgery.  Reviewed chart with Dr. Luane School okay to have surgery at [facility]

## 2017-11-22 NOTE — Progress Notes (Signed)
Called Dr. Allison Quarry office to request that the cardiac clearance include note that addressed pt is clear for Outpatient center, if that is indeed the plan.

## 2017-11-22 NOTE — Telephone Encounter (Signed)
Pharm please address xarelto and then we will call for phone clearance.

## 2017-11-22 NOTE — Telephone Encounter (Signed)
Dr. Deeann Saint office called to advise our office if pt is ok to be cleared for Out Pt Surgery please put in the note Cleared for Out Pt Surgery; or if the provider feels should be done in the the hospital then to please put in note to be done in the hospital. This is a request from Dr. Deeann Saint office as to pending the clearance note where the surgery will be done for the pt.

## 2017-11-22 NOTE — Telephone Encounter (Signed)
Patient with diagnosis of Afib on Xarelto for anticoagulation.    Procedure: nasal electrocavtery Date of procedure: 11/29/17  CHADS2-VASc score of  4 (CHF, HTN, AGE, DM2, stroke/tia x 2, CAD, AGE, male)  CrCl 31ml/min  Per office protocol, patient can hold Xarelto for 1-2 days prior to procedure.

## 2017-11-23 ENCOUNTER — Encounter (HOSPITAL_COMMUNITY)
Admission: RE | Admit: 2017-11-23 | Discharge: 2017-11-23 | Disposition: A | Discharge status: Home / Self Care | Payer: PPO | Source: Ambulatory Visit | Attending: Cardiology | Admitting: Cardiology

## 2017-11-23 ENCOUNTER — Ambulatory Visit (HOSPITAL_COMMUNITY): Payer: Self-pay

## 2017-11-23 DIAGNOSIS — Z951 Presence of aortocoronary bypass graft: Secondary | ICD-10-CM

## 2017-11-23 NOTE — Telephone Encounter (Signed)
Follow up  ° ° °Patient is returning your call. °

## 2017-11-23 NOTE — Progress Notes (Signed)
I have reviewed a Home Exercise Prescription with [name] . Taylor [name] is is currently exercising at home.  The patient was advised to continue walking 4  days a week for 45-60 minutes.  [name] and I discussed how to progress their exercise prescription. The patient stated that they understand the exercise prescription.  We reviewed exercise guidelines, target heart rate during exercise, RPE Scale, weather conditions, NTG use, endpoints for exercise, warmup and cool down.  Patient is encouraged to come to me with any questions. I will continue to follow up with the patient to assist them with progression and safety.    11/23/2017 11:01 AM  Deitra Mayo BS, ACSM CEP

## 2017-11-23 NOTE — Telephone Encounter (Signed)
Left message for pt to call back.  Dr. Ellyn Hack - just to verify, will it be ok for pt to have procedure outpt or would you prefer in the hospital.  ?  Dr. Marlowe Shores office was inquiring.

## 2017-11-25 NOTE — Telephone Encounter (Signed)
Honestly, I think looking at his monitor he probably has more SVT then A. fib.  It is fine for him to hold his Xarelto, and is fine without it his procedure is an outpatient.  I when asked EP to look at this monitor to see if it is truly A. fib.  The Christoper Fabian fact that he has a lot of episodes of SVT and will only 1 or 2 they were read as A. fib makes me think it is probably just A. fib.  Very regular for A. Fib.

## 2017-11-25 NOTE — Progress Notes (Signed)
I have asked EP to evaluate the monitor to see if they think that this is A. fib versus SVT.  For now we can probably continue with Xarelto but okay to hold for his procedure.  Procedure can be done as an outpatient.  If it does not truly appear to be A. fib, then he would stop Xarelto.  [name], MD

## 2017-11-26 ENCOUNTER — Ambulatory Visit (HOSPITAL_COMMUNITY): Payer: Self-pay

## 2017-11-26 ENCOUNTER — Encounter (HOSPITAL_COMMUNITY): Payer: PPO

## 2017-11-26 NOTE — Telephone Encounter (Signed)
   Primary Cardiologist: [name], MD  Chart reviewed as part of pre-operative protocol coverage.   He has a hx of CAD s/p CABG in 08/2017, ischemic CM, DM2.  He was last seen by Dr. Ellyn Hack in 9/19.  He was contacted today and is doing well without chest pain or shortness of breath.    RCRI:  0.9.  I reviewed his case with Dr. Ellyn Hack.  He contacted the patient recently and told him to stop the ASA after a trip to the ED with recurrent epistaxis.  He contacted him more recently and told the patient to stop the Xarelto for now and he will discuss it with him at follow up in 12/2017.    Since he is off Xarelto now, he should resume ASA.  I have asked him to check with Dr. Deeann Saint office to see if he can start back on ASA now or wait until after his nasal procedure.  Of note, Dr. Ellyn Hack agreed he could have the procedure as an outpatient.    Call back staff: I have sent a note to Dr. Deeann Saint office.  Please make sure it was received. This note will be removed from the preop pool.  Richardson Dopp, PA-C 11/26/2017, 4:31 PM

## 2017-11-28 ENCOUNTER — Encounter (HOSPITAL_COMMUNITY): Payer: PPO

## 2017-11-28 ENCOUNTER — Ambulatory Visit (HOSPITAL_COMMUNITY): Payer: Self-pay

## 2017-11-29 ENCOUNTER — Ambulatory Visit (HOSPITAL_BASED_OUTPATIENT_CLINIC_OR_DEPARTMENT_OTHER): Payer: PPO | Admitting: Anesthesiology

## 2017-11-29 ENCOUNTER — Encounter (HOSPITAL_BASED_OUTPATIENT_CLINIC_OR_DEPARTMENT_OTHER): Payer: Self-pay | Admitting: Anesthesiology

## 2017-11-29 ENCOUNTER — Encounter (HOSPITAL_BASED_OUTPATIENT_CLINIC_OR_DEPARTMENT_OTHER)
Admission: RE | Disposition: A | Discharge status: Home / Self Care | Payer: Self-pay | Source: Ambulatory Visit | Attending: Otolaryngology

## 2017-11-29 ENCOUNTER — Other Ambulatory Visit: Payer: Self-pay

## 2017-11-29 ENCOUNTER — Ambulatory Visit (HOSPITAL_BASED_OUTPATIENT_CLINIC_OR_DEPARTMENT_OTHER)
Admission: RE | Admit: 2017-11-29 | Discharge: 2017-11-29 | Disposition: A | Discharge status: Home / Self Care | Payer: PPO | Source: Ambulatory Visit | Attending: Otolaryngology | Admitting: Otolaryngology

## 2017-11-29 DIAGNOSIS — E119 Type 2 diabetes mellitus without complications: Secondary | ICD-10-CM | POA: Insufficient documentation

## 2017-11-29 DIAGNOSIS — Z7982 Long term (current) use of aspirin: Secondary | ICD-10-CM | POA: Diagnosis not present

## 2017-11-29 DIAGNOSIS — R040 Epistaxis: Secondary | ICD-10-CM | POA: Diagnosis not present

## 2017-11-29 DIAGNOSIS — I2510 Atherosclerotic heart disease of native coronary artery without angina pectoris: Secondary | ICD-10-CM | POA: Diagnosis not present

## 2017-11-29 DIAGNOSIS — I10 Essential (primary) hypertension: Secondary | ICD-10-CM | POA: Insufficient documentation

## 2017-11-29 HISTORY — PX: NASAL ENDOSCOPY WITH EPISTAXIS CONTROL: SHX5664

## 2017-11-29 LAB — GLUCOSE, CAPILLARY
Glucose-Capillary: 154 mg/dL — ABNORMAL HIGH (ref 70–99)
Glucose-Capillary: 126 mg/dL — ABNORMAL HIGH (ref 70–99)

## 2017-11-29 SURGERY — CONTROL OF EPISTAXIS, ENDOSCOPIC
Anesthesia: General

## 2017-11-29 MED ORDER — MIDAZOLAM HCL 2 MG/2ML IJ SOLN
INTRAMUSCULAR | Status: AC
  Filled 2017-11-29: qty 2

## 2017-11-29 MED ORDER — PROPOFOL 10 MG/ML IV BOLUS
INTRAVENOUS | Status: AC
  Filled 2017-11-29: qty 20

## 2017-11-29 MED ORDER — LIDOCAINE HCL (CARDIAC) PF 100 MG/5ML IV SOSY
PREFILLED_SYRINGE | INTRAVENOUS | Status: DC | PRN
  Administered 2017-11-29: 30 mg via INTRAVENOUS

## 2017-11-29 MED ORDER — OXYMETAZOLINE HCL 0.05 % NA SOLN
NASAL | Status: AC
  Filled 2017-11-29: qty 15

## 2017-11-29 MED ORDER — OXYCODONE-ACETAMINOPHEN 5-325 MG PO TABS: 1.0000 | ORAL_TABLET | ORAL | 0 refills | Status: DC | PRN

## 2017-11-29 MED ORDER — MIDAZOLAM HCL 5 MG/5ML IJ SOLN
INTRAMUSCULAR | Status: DC | PRN
  Administered 2017-11-29: 2 mg via INTRAVENOUS

## 2017-11-29 MED ORDER — FENTANYL CITRATE (PF) 100 MCG/2ML IJ SOLN: 50.0000 ug | INTRAMUSCULAR | Status: DC | PRN

## 2017-11-29 MED ORDER — FENTANYL CITRATE (PF) 100 MCG/2ML IJ SOLN
INTRAMUSCULAR | Status: AC
  Filled 2017-11-29: qty 2

## 2017-11-29 MED ORDER — ACETAMINOPHEN 160 MG/5ML PO SOLN: 325.0000 mg | ORAL | Status: DC | PRN

## 2017-11-29 MED ORDER — BACITRACIN ZINC 500 UNIT/GM EX OINT
TOPICAL_OINTMENT | CUTANEOUS | Status: DC | PRN
  Administered 2017-11-29: 1 via TOPICAL

## 2017-11-29 MED ORDER — FENTANYL CITRATE (PF) 100 MCG/2ML IJ SOLN
INTRAMUSCULAR | Status: DC | PRN
  Administered 2017-11-29: 100 ug via INTRAVENOUS

## 2017-11-29 MED ORDER — MIDAZOLAM HCL 2 MG/2ML IJ SOLN: 1.0000 mg | INTRAMUSCULAR | Status: DC | PRN

## 2017-11-29 MED ORDER — PROPOFOL 10 MG/ML IV BOLUS
INTRAVENOUS | Status: DC | PRN
  Administered 2017-11-29: 200 mg via INTRAVENOUS

## 2017-11-29 MED ORDER — PHENYLEPHRINE HCL 10 MG/ML IJ SOLN
INTRAMUSCULAR | Status: DC | PRN
  Administered 2017-11-29 (×2): 80 ug via INTRAVENOUS

## 2017-11-29 MED ORDER — BACITRACIN ZINC 500 UNIT/GM EX OINT
TOPICAL_OINTMENT | CUTANEOUS | Status: AC
  Filled 2017-11-29: qty 28.35

## 2017-11-29 MED ORDER — DEXAMETHASONE SODIUM PHOSPHATE 4 MG/ML IJ SOLN
INTRAMUSCULAR | Status: DC | PRN
  Administered 2017-11-29: 10 mg via INTRAVENOUS

## 2017-11-29 MED ORDER — FENTANYL CITRATE (PF) 100 MCG/2ML IJ SOLN: 25.0000 ug | INTRAMUSCULAR | Status: DC | PRN

## 2017-11-29 MED ORDER — ACETAMINOPHEN 325 MG PO TABS: 325.0000 mg | ORAL_TABLET | ORAL | Status: DC | PRN

## 2017-11-29 MED ORDER — ONDANSETRON HCL 4 MG/2ML IJ SOLN
INTRAMUSCULAR | Status: DC | PRN
  Administered 2017-11-29: 4 mg via INTRAVENOUS

## 2017-11-29 MED ORDER — ONDANSETRON HCL 4 MG/2ML IJ SOLN
INTRAMUSCULAR | Status: AC
  Filled 2017-11-29: qty 2

## 2017-11-29 MED ORDER — LACTATED RINGERS IV SOLN
INTRAVENOUS | Status: DC
  Administered 2017-11-29 (×2): via INTRAVENOUS

## 2017-11-29 MED ORDER — DEXAMETHASONE SODIUM PHOSPHATE 10 MG/ML IJ SOLN
INTRAMUSCULAR | Status: AC
  Filled 2017-11-29: qty 1

## 2017-11-29 MED ORDER — SCOPOLAMINE 1 MG/3DAYS TD PT72: 1.0000 | MEDICATED_PATCH | Freq: Once | TRANSDERMAL | Status: DC | PRN

## 2017-11-29 MED ORDER — SILVER NITRATE-POT NITRATE 75-25 % EX MISC
CUTANEOUS | Status: AC
  Filled 2017-11-29: qty 1

## 2017-11-29 MED ORDER — OXYCODONE HCL 5 MG PO TABS: 5.0000 mg | ORAL_TABLET | Freq: Once | ORAL | Status: DC | PRN

## 2017-11-29 MED ORDER — ONDANSETRON HCL 4 MG/2ML IJ SOLN: 4.0000 mg | Freq: Once | INTRAMUSCULAR | Status: DC | PRN

## 2017-11-29 MED ORDER — OXYMETAZOLINE HCL 0.05 % NA SOLN
NASAL | Status: DC | PRN
  Administered 2017-11-29: 1

## 2017-11-29 MED ORDER — GLYCOPYRROLATE 0.2 MG/ML IJ SOLN
INTRAMUSCULAR | Status: DC | PRN
  Administered 2017-11-29: 0.2 mg via INTRAVENOUS

## 2017-11-29 MED ORDER — LIDOCAINE 2% (20 MG/ML) 5 ML SYRINGE
INTRAMUSCULAR | Status: AC
  Filled 2017-11-29: qty 5

## 2017-11-29 MED ORDER — OXYCODONE HCL 5 MG/5ML PO SOLN: 5.0000 mg | Freq: Once | ORAL | Status: DC | PRN

## 2017-11-29 MED ORDER — MEPERIDINE HCL 25 MG/ML IJ SOLN: 6.2500 mg | INTRAMUSCULAR | Status: DC | PRN

## 2017-11-29 SURGICAL SUPPLY — 24 items
APPLICATOR COTTON TIP 6 STRL (MISCELLANEOUS) ×2 IMPLANT
APPLICATOR COTTON TIP 6IN STRL (MISCELLANEOUS) ×4
CANISTER SUCT 1200ML W/VALVE (MISCELLANEOUS) ×2 IMPLANT
COAGULATOR SUCT 8FR VV (MISCELLANEOUS) ×2 IMPLANT
CONT SPEC 4OZ CLIKSEAL STRL BL (MISCELLANEOUS) ×4 IMPLANT
COVER WAND RF STERILE (DRAPES) IMPLANT
DECANTER SPIKE VIAL GLASS SM (MISCELLANEOUS) IMPLANT
DEPRESSOR TONGUE BLADE STERILE (MISCELLANEOUS) IMPLANT
DRSG TELFA 3X8 NADH (GAUZE/BANDAGES/DRESSINGS) IMPLANT
ELECT REM PT RETURN 9FT ADLT (ELECTROSURGICAL)
ELECT REM PT RETURN 9FT PED (ELECTROSURGICAL)
ELECTRODE REM PT RETRN 9FT PED (ELECTROSURGICAL) IMPLANT
ELECTRODE REM PT RTRN 9FT ADLT (ELECTROSURGICAL) IMPLANT
GAUZE SPONGE 4X4 12PLY STRL (GAUZE/BANDAGES/DRESSINGS) IMPLANT
GLOVE BIO SURGEON STRL SZ7.5 (GLOVE) ×2 IMPLANT
MARKER SKIN DUAL TIP RULER LAB (MISCELLANEOUS) IMPLANT
PACK BASIN DAY SURGERY FS (CUSTOM PROCEDURE TRAY) ×2 IMPLANT
PACKING NASAL EPIS 4X2.4 XEROG (MISCELLANEOUS) IMPLANT
SHEET MEDIUM DRAPE 40X70 STRL (DRAPES) ×2 IMPLANT
SOLUTION BUTLER CLEAR DIP (MISCELLANEOUS) ×2 IMPLANT
SPONGE GAUZE 2X2 8PLY STRL LF (GAUZE/BANDAGES/DRESSINGS) IMPLANT
SPONGE NEURO XRAY DETECT 1X3 (DISPOSABLE) ×2 IMPLANT
TOWEL GREEN STERILE FF (TOWEL DISPOSABLE) ×2 IMPLANT
TUBE CONNECTING 20X1/4 (TUBING) ×2 IMPLANT

## 2017-11-29 NOTE — Op Note (Signed)
DATE OF PROCEDURE: 11/29/2017  OPERATIVE REPORT   SURGEON: Leta Baptist, MD  PREOPERATIVE DIAGNOSIS: Recurrent right epistaxis  POSTOPERATIVE DIAGNOSIS: Recurrent right epistaxis  PROCEDURES PERFORMED: 1. Right endoscopic nasal electrocautery  ANESTHESIA: General laryngeal mask anesthesia.  COMPLICATIONS: None.  ESTIMATED BLOOD LOSS: Minimal.  INDICATION FOR PROCEDURE:  Taylor [name] is a 67 y.o. male with a history of recurrent right epistaxis.   He previously underwent multiple cauterizations of the right nasal cavity with silver nitrate.  The patient returns today reporting more recurrent right sided nosebleeds.  Based on the above findings, the decision was made for the patient to undergo the above-stated procedures. The risks, benefits, alternatives, and details of the procedures were discussed with the patent. Questions were invited and answered. Informed consent was obtained.  DESCRIPTION OF PROCEDURE: The patient was taken to the operating room and placed supine on the operating table. General laryngeal mask anesthesia was induced by the anesthesiologist.   Pledgets soaked with Afrin were placed in the right nasal cavity. The pledgets were subsequently removed. Using a 0 endoscope, the right nasal cavity was examined. Several hypervascular areas were noted at the right inferior/posterior nasal septum. A suction electrocautery device was used to cauterize the hypervascular areas. Good hemostasis was achieved. Antibiotic ointment was applied.  The care of the patient was turned over to the anesthesiologist. The patient was awakened from anesthesia without difficulty. He  was extubated and transferred to the recovery room in good condition.  OPERATIVE FINDINGS:Hypervascular areas on the right inferior/posterior nasal septum.  SPECIMEN: None.  FOLLOWUP CARE: The patient will be discharged home once he is awake and alert. He will follow up in my office  in 1 month.

## 2017-11-29 NOTE — Anesthesia Preprocedure Evaluation (Signed)
Anesthesia Evaluation  Patient identified by MRN, date of birth, ID band Patient awake    Reviewed: Allergy & Precautions, NPO status , Patient's Chart, lab work & pertinent test results  Airway Mallampati: II  TM Distance: >3 FB Neck ROM: Full    Dental  (+) Teeth Intact   Pulmonary    breath sounds clear to auscultation       Cardiovascular hypertension, Pt. on medications and Pt. on home beta blockers + CAD   Rhythm:Regular Rate:Normal     Neuro/Psych    GI/Hepatic   Endo/Other  diabetes  Renal/GU      Musculoskeletal   Abdominal   Peds  Hematology   Anesthesia Other Findings   Reproductive/Obstetrics                             Anesthesia Physical  Anesthesia Plan  ASA: III  Anesthesia Plan: General   Post-op Pain Management:    Induction: Intravenous  PONV Risk Score and Plan: 2 and Ondansetron, Dexamethasone and Treatment may vary due to age or medical condition  Airway Management Planned: LMA and Oral ETT  Additional Equipment: None  Intra-op Plan:   Post-operative Plan: Extubation in OR  Informed Consent: I have reviewed the patients History and Physical, chart, labs and discussed the procedure including the risks, benefits and alternatives for the proposed anesthesia with the patient or authorized representative who has indicated his/her understanding and acceptance.     Plan Discussed with: CRNA, Anesthesiologist and Surgeon  Anesthesia Plan Comments:         Anesthesia Quick Evaluation

## 2017-11-29 NOTE — Anesthesia Procedure Notes (Signed)
Procedure Name: LMA Insertion Date/Time: 11/29/2017 8:29 AM Performed by: Marrianne Mood, CRNA Pre-anesthesia Checklist: Patient identified, Emergency Drugs available, Suction available, Patient being monitored and Timeout performed Patient Re-evaluated:Patient Re-evaluated prior to induction Oxygen Delivery Method: Circle system utilized Preoxygenation: Pre-oxygenation with 100% oxygen Induction Type: IV induction Ventilation: Mask ventilation without difficulty LMA: LMA inserted LMA Size: 5.0 Number of attempts: 1 Airway Equipment and Method: Bite block Placement Confirmation: positive ETCO2 Tube secured with: Tape Dental Injury: Teeth and Oropharynx as per pre-operative assessment

## 2017-11-29 NOTE — Transfer of Care (Signed)
Immediate Anesthesia Transfer of Care Note  Patient: Taylor [name]  Procedure(s) Performed: NASAL ENDOSCOPY WITH EPISTAXIS CONTROL (N/A )  Patient Location: PACU  Anesthesia Type:General  Level of Consciousness: sedated  Airway & Oxygen Therapy: Patient Spontanous Breathing and Patient connected to face mask oxygen  Post-op Assessment: Report given to RN and Post -op Vital signs reviewed and stable  Post vital signs: Reviewed and stable  Last Vitals:  Vitals Value Taken Time  BP    Temp    Pulse    Resp    SpO2      Last Pain:  Vitals:   11/29/17 0741  TempSrc: Oral  PainSc: 0-No pain         Complications: No apparent anesthesia complications

## 2017-11-29 NOTE — Anesthesia Postprocedure Evaluation (Signed)
Anesthesia Post Note  Patient: Taylor [name]  Procedure(s) Performed: NASAL ENDOSCOPY WITH EPISTAXIS CONTROL (N/A )     Patient location during evaluation: PACU Anesthesia Type: General Level of consciousness: awake and alert Pain management: pain level controlled Vital Signs Assessment: post-procedure vital signs reviewed and stable Respiratory status: spontaneous breathing, nonlabored ventilation, respiratory function stable and patient connected to nasal cannula oxygen Cardiovascular status: blood pressure returned to baseline and stable Postop Assessment: no apparent nausea or vomiting Anesthetic complications: no    Last Vitals:  Vitals:   11/29/17 0945 11/29/17 1011  BP: 139/74 (!) 149/80  Pulse: 71 69  Resp: 17 16  Temp:  36.6 C  SpO2: 100% 96%    Last Pain:  Vitals:   11/29/17 1011  TempSrc: Oral  PainSc: 0-No pain                 Delynn Pursley

## 2017-11-29 NOTE — H&P (Signed)
Cc: Recurrent right epistaxis  HPI:  The patient is an 67 year old male with a history of recurrent right epistaxis. He was last seen 3 weeks ago.  He previously underwent cauterizations of the right nasal cavity.  The patient returns today reporting persistent right sided nosebleeds. His nose has been bleeding all day.  He is on a baby aspirin.   Exam: The right nasal cavity is sprayed with topical xylocaine and neo-synephrine.  After adequate anesthesia is achieved, the nasal cavity is examined with a 0 rigid endoscope.  A suction catheter is inserted into parallel with the 0 endoscope, and it is used to suction the blood clots from the right nasal cavity.  A hypervascular area is noted at the right lateral nasal wall, inferior to the inferior turbinate, along his septal spur.  A silver nitrate stick is inserted in parallel with the 0 endoscope.  It is used to repeatedly cauterized the hypervascular area. Small gauze packing is placed.  The patient tolerated the procedure well.  Assessment: Several hypervascular areas are noted along the posterior nasal septum at a septal spur. Active bleeding is noted.   Plan:  In light of his recurrent bleeding, will proceed with endoscopic electrocautery of his nasal septum in the OR under anesthesia.

## 2017-11-29 NOTE — Discharge Instructions (Addendum)
The patient may resume all his previous activities and diet. He will follow-up in my office in approximately 1 month. Per Dr Benjamine Mola, patient may use Bacitracin at site for moisture. If area gets too dry, use vaseline instead.    Post Anesthesia Home Care Instructions  Activity: Get plenty of rest for the remainder of the day. A responsible individual must stay with you for 24 hours following the procedure.  For the next 24 hours, DO NOT: -Drive a car -Paediatric nurse -Drink alcoholic beverages -Take any medication unless instructed by your physician -Make any legal decisions or sign important papers.  Meals: Start with liquid foods such as gelatin or soup. Progress to regular foods as tolerated. Avoid greasy, spicy, heavy foods. If nausea and/or vomiting occur, drink only clear liquids until the nausea and/or vomiting subsides. Call your physician if vomiting continues.  Special Instructions/Symptoms: Your throat may feel dry or sore from the anesthesia or the breathing tube placed in your throat during surgery. If this causes discomfort, gargle with warm salt water. The discomfort should disappear within 24 hours.  If you had a scopolamine patch placed behind your ear for the management of post- operative nausea and/or vomiting:  1. The medication in the patch is effective for 72 hours, after which it should be removed.  Wrap patch in a tissue and discard in the trash. Wash hands thoroughly with soap and water. 2. You may remove the patch earlier than 72 hours if you experience unpleasant side effects which may include dry mouth, dizziness or visual disturbances. 3. Avoid touching the patch. Wash your hands with soap and water after contact with the patch.

## 2017-11-30 ENCOUNTER — Ambulatory Visit (HOSPITAL_COMMUNITY): Payer: Self-pay

## 2017-11-30 ENCOUNTER — Other Ambulatory Visit: Payer: Self-pay | Admitting: Physician Assistant

## 2017-11-30 ENCOUNTER — Encounter (HOSPITAL_COMMUNITY): Payer: PPO

## 2017-12-03 ENCOUNTER — Encounter (HOSPITAL_COMMUNITY)
Admission: RE | Admit: 2017-12-03 | Discharge: 2017-12-03 | Disposition: A | Discharge status: Home / Self Care | Payer: PPO | Source: Ambulatory Visit | Attending: Cardiology | Admitting: Cardiology

## 2017-12-03 ENCOUNTER — Encounter (HOSPITAL_BASED_OUTPATIENT_CLINIC_OR_DEPARTMENT_OTHER): Payer: Self-pay | Admitting: Otolaryngology

## 2017-12-03 ENCOUNTER — Ambulatory Visit (HOSPITAL_COMMUNITY): Payer: Self-pay

## 2017-12-03 DIAGNOSIS — Z951 Presence of aortocoronary bypass graft: Secondary | ICD-10-CM

## 2017-12-05 ENCOUNTER — Ambulatory Visit (HOSPITAL_COMMUNITY): Payer: Self-pay

## 2017-12-05 ENCOUNTER — Encounter (HOSPITAL_COMMUNITY)
Admission: RE | Admit: 2017-12-05 | Discharge: 2017-12-05 | Disposition: A | Discharge status: Home / Self Care | Payer: PPO | Source: Ambulatory Visit | Attending: Cardiology | Admitting: Cardiology

## 2017-12-05 DIAGNOSIS — Z951 Presence of aortocoronary bypass graft: Secondary | ICD-10-CM | POA: Diagnosis not present

## 2017-12-05 NOTE — Progress Notes (Signed)
Taylor [name] 67 y.o. male Nutrition Note Spoke with pt. Nutrition plan and goals reviewed with pt. Pt is following heart healthy diet. Pt has no desire for weight change at this time. Discussed with patient that even a small percentage of weight change could impact blood glucose levels. Heart healthy tips reviewed (label reading, how to build a healthy plate, portion sizes, eating frequently across the day, decreasing frequency of eating out). Pt was more open to dietitian recommendations today, will continue to utilize nutrition education and motivational interviewing to help draw light between patients behaviors currently and patients goals. Pt is diabetic. Last A1c indicates blood glucose not well-controlled (7.6 on 09/04/17). Pt checks CBG's rarely, 1-2 times a week. Fasting CBG's reportedly 130-150's mg/dL. Recommended pt check CBG's more regularly, 4-6x daily to help increase awareness of blood glucose levels and what foods impact him. Per discussion, pt does not use canned/convenience foods often. Pt rarely adds salt to food. Pt eats out less frequently (since last discussion with RD). Praised pt for this change and encouraged him to keep it up. Pt expressed understanding of the information reviewed. Pt aware of nutrition education classes offered and plans on attending nutrition classes.  Lab Results  Component Value Date   HGBA1C 7.6 (H) 09/04/2017    Wt Readings from Last 3 Encounters:  11/29/17 212 lb 11.9 oz (96.5 kg)  11/12/17 209 lb 12.8 oz (95.2 kg)  10/27/17 200 lb (90.7 kg)    Nutrition Diagnosis ? Food-and nutrition-related knowledge deficit related to lack of exposure to information as related to diagnosis of: ? CVD ? Type 2 Diabetes  Nutrition Intervention ? Pt's individual nutrition plan reviewed with pt.   Goal(s)  ? Pt to identify and limit food sources of saturated fat, trans fat, refined carbohydrates and sodium ? Improved blood glucose control as evidenced by  pt's A1c trending from 7.6 toward less than 7.0. ? Pt able to name foods that affect blood glucose.  Plan:   Pt to attend nutrition classes ? Nutrition I ? Nutrition II ? Portion Distortion   Will provide client-centered nutrition education as part of interdisciplinary care  Monitor and evaluate progress toward nutrition goal with team.    Laurina Bustle, MS, RD, LDN 12/05/2017 9:12 AM

## 2017-12-06 ENCOUNTER — Other Ambulatory Visit: Payer: Self-pay | Admitting: Physician Assistant

## 2017-12-07 ENCOUNTER — Ambulatory Visit (HOSPITAL_COMMUNITY): Payer: Self-pay

## 2017-12-07 ENCOUNTER — Other Ambulatory Visit: Payer: Self-pay | Admitting: Cardiology

## 2017-12-07 ENCOUNTER — Encounter (HOSPITAL_COMMUNITY)
Admission: RE | Admit: 2017-12-07 | Discharge: 2017-12-07 | Disposition: A | Discharge status: Home / Self Care | Payer: PPO | Source: Ambulatory Visit | Attending: Cardiology | Admitting: Cardiology

## 2017-12-07 DIAGNOSIS — Z951 Presence of aortocoronary bypass graft: Secondary | ICD-10-CM | POA: Diagnosis not present

## 2017-12-10 ENCOUNTER — Ambulatory Visit (HOSPITAL_COMMUNITY): Payer: Self-pay

## 2017-12-10 ENCOUNTER — Encounter (HOSPITAL_COMMUNITY)
Admission: RE | Admit: 2017-12-10 | Discharge: 2017-12-10 | Disposition: A | Discharge status: Home / Self Care | Payer: PPO | Source: Ambulatory Visit | Attending: Cardiology | Admitting: Cardiology

## 2017-12-10 DIAGNOSIS — Z951 Presence of aortocoronary bypass graft: Secondary | ICD-10-CM | POA: Diagnosis not present

## 2017-12-12 ENCOUNTER — Ambulatory Visit (HOSPITAL_COMMUNITY): Payer: Self-pay

## 2017-12-12 ENCOUNTER — Encounter (HOSPITAL_COMMUNITY)
Admission: RE | Admit: 2017-12-12 | Discharge: 2017-12-12 | Disposition: A | Discharge status: Home / Self Care | Payer: PPO | Source: Ambulatory Visit | Attending: Cardiology | Admitting: Cardiology

## 2017-12-12 DIAGNOSIS — Z951 Presence of aortocoronary bypass graft: Secondary | ICD-10-CM

## 2017-12-13 ENCOUNTER — Encounter (HOSPITAL_COMMUNITY): Payer: Self-pay

## 2017-12-13 NOTE — Progress Notes (Signed)
Cardiac Individual Treatment Plan  Patient Details  Name: Taylor [name] MRN: [id number] Date of Birth: [birth year] Referring Provider:   Flowsheet Row CARDIAC REHAB PHASE II ORIENTATION from 10/25/2017 in [facility]  Referring Provider  Leonie Man MD       Initial Encounter Date:  [place] PHASE II ORIENTATION from 10/25/2017 in [place]  Date  10/25/17      Visit Diagnosis: S/P CABG x 4 09/06/17  Patient's Home Medications on Admission:  Current Outpatient Medications:  .  aspirin EC 81 MG tablet, Take 1 tablet (81 mg total) by mouth daily., Disp: 90 tablet, Rfl: 3 .  atorvastatin (LIPITOR) 80 MG tablet, Take 1 tablet (80 mg total) by mouth daily at 6 PM., Disp: 90 tablet, Rfl: 3 .  lisinopril (PRINIVIL,ZESTRIL) 10 MG tablet, TAKE 1 TABLET BY MOUTH EVERY DAY, Disp: 30 tablet, Rfl: 5 .  metoprolol succinate (TOPROL XL) 25 MG 24 hr tablet, Take 1 tablet (25 mg total) by mouth daily., Disp: 90 tablet, Rfl: 3 .  metoprolol tartrate (LOPRESSOR) 25 MG tablet, Take 1 tablet (25 mg total) by mouth as needed. TAKE FOR PROLONG INCREASE HEART RATE OR PALPATIONS, Disp: 20 tablet, Rfl: 6 .  Multiple Vitamin (MULTIVITAMIN) tablet, Take 1 tablet by mouth daily., Disp: , Rfl:  .  oxyCODONE-acetaminophen (PERCOCET) 5-325 MG tablet, Take 1 tablet by mouth every 4 (four) hours as needed for severe pain., Disp: 10 tablet, Rfl: 0 .  sitaGLIPtan-metformin (JANUMET) 50-500 MG per tablet, Take 1 tablet by mouth 2 (two) times daily with a meal. , Disp: , Rfl:   Past Medical History: Past Medical History:  Diagnosis Date  . Bigeminal rhythm 08/01/2017   per EKG 07-24-2017 in epic (per confirmation new since last ecg in 2007)  in care everywhere in epic ecg dated 2012 shows sinus brady rate 53 no pvc's --per pt asymptomatic and   . Bradycardia   . Coronary artery disease involving native coronary artery 08/2017    Abnormal Myoview 08/2017 (Abnormal EKG - Severe ST depressions, PVCs & nsVT). TID noted.  Moderate LAD territory Ischemic Perfusion defect --> Cath with extensive RCA (RPDA & RPAV), Cx & LAD disease --> CABG x 4  . Family history of premature coronary artery disease    Father - MI 75. Paternal uncle x 2 -1 with MI & other with CABG in 15s  . History of contusion    ED visit 08-04-2013 , 9 days past injury ,  dx concussion w/ LOC, per CT inferior frontal lobes contusion hemarrhage-- residual headache (08-01-2017  per pt residual resolved)  . History of kidney stones   . Hypercholesterolemia   . Hypertension   . Nephrolithiasis    bilateral nonobstructive per CT 07-24-2017  . S/P CABG x 4 09/06/2017   LIMA to LAD, SVG to OM2, Sequential SVG to PDA and RPL2, EVH via right thigh and leg  . Type 2 diabetes mellitus ([place])    followed by pcp  . Ureteral calculi    left  . Wears glasses   . Wears hearing aid in both ears     Tobacco Use: Social History   Tobacco Use  Smoking Status Never Smoker  Smokeless Tobacco Never Used    Labs: Recent Review Flowsheet Data    Labs for ITP Cardiac and Pulmonary Rehab Latest Ref Rng & Units 09/06/2017 09/06/2017 09/06/2017 09/06/2017 09/07/2017   Hemoglobin A1c 4.8 -  5.6 % - - - - -   PHART 7.350 - 7.450 7.354 7.351 7.339(L) - -   PCO2ART 32.0 - 48.0 mmHg 41.5 37.2 43.0 - -   HCO3 20.0 - 28.0 mmol/L 23.4 20.4 23.2 - -   TCO2 22 - 32 mmol/L _0 ACIDBASEDEF 0.0 - 2.0 mmol/L 2.0 5.0(H) 3.0(H) - -   O2SAT % 97.0 98.0 99.0 - -      Capillary Blood Glucose: Lab Results  Component Value Date   GLUCAP 126 (H) 11/29/2017   GLUCAP 154 (H) 11/29/2017   GLUCAP 129 (H) 09/11/2017   GLUCAP 130 (H) 09/10/2017   GLUCAP 101 (H) 09/10/2017     Exercise Target Goals: Exercise Program Goal: Individual exercise prescription set using results from initial 6 min walk test and THRR while considering  patient's activity barriers and safety.   Exercise  Prescription Goal: Initial exercise prescription builds to 30-45 minutes a day of aerobic activity, 2-3 days per week.  Home exercise guidelines will be given to patient during program as part of exercise prescription that the participant will acknowledge.  Activity Barriers & Risk Stratification: Activity Barriers & Cardiac Risk Stratification - 10/25/17 1145    Activity Barriers & Cardiac Risk Stratification          Activity Barriers  None    Cardiac Risk Stratification  High           6 Minute Walk: 6 Minute Walk    6 Minute Walk    Row Name 10/25/17 1144   Phase  Initial   Distance  1710 feet   Walk Time  6 minutes   # of Rest Breaks  0   MPH  3.24   METS  3.77   RPE  11   Perceived Dyspnea   0   VO2 Peak  13.2   Symptoms  No   Resting HR  63 bpm   Resting BP  110/70   Resting Oxygen Saturation   97 %   Exercise Oxygen Saturation  during 6 min walk  97 %   Max Ex. HR  89 bpm   Max Ex. BP  148/60   2 Minute Post BP  124/72          Oxygen Initial Assessment:   Oxygen Re-Evaluation:   Oxygen Discharge (Final Oxygen Re-Evaluation):   Initial Exercise Prescription: Initial Exercise Prescription - 10/25/17 1100    Date of Initial Exercise RX and Referring Provider          Date  10/25/17    Referring Provider  Leonie Man MD     Expected Discharge Date  01/18/18        Treadmill          MPH  3    Grade  1    Minutes  10    METs  3.71        Bike          Level  1.2    Minutes  10    METs  3.38        NuStep          Level  2    SPM  75    Minutes  10    METs  2.8        Prescription Details          Frequency (times per week)  3x    Duration  Progress to  30 minutes of continuous aerobic without signs/symptoms of physical distress        Intensity          THRR 40-80% of Max Heartrate  61-122    Ratings of Perceived Exertion  11-13    Perceived Dyspnea  0-4        Progression          Progression  Continue  progressive overload as per policy without signs/symptoms or physical distress.        Resistance Training          Training Prescription  Yes    Weight  3lbs    Reps  10-15           Perform Capillary Blood Glucose checks as needed.  Exercise Prescription Changes: Exercise Prescription Changes    Response to Exercise    Row Name 11/05/17 1413 11/14/17 1415 11/19/17 1506 12/12/17 1458   Blood Pressure (Admit)  120/60  124/78  154/80  132/80   Blood Pressure (Exercise)  158/68  168/66  140/80  152/84   Blood Pressure (Exit)  120/62  126/72  120/70  120/80   Heart Rate (Admit)  84 bpm  65 bpm  62 bpm  60 bpm   Heart Rate (Exercise)  115 bpm  115 bpm  115 bpm  123 bpm   Heart Rate (Exit)  80 bpm  65 bpm  68 bpm  70 bpm   Rating of Perceived Exertion (Exercise)  '12  12  12  11   '$ Perceived Dyspnea (Exercise)  0  0  0  0   Symptoms  None  None  None  None   Comments  Pt oriented to exercise equipment   no documentation  no documentation  no documentation   Duration  Progress to 30 minutes of  aerobic without signs/symptoms of physical distress  Continue with 30 min of aerobic exercise without signs/symptoms of physical distress.  Continue with 30 min of aerobic exercise without signs/symptoms of physical distress.  Continue with 30 min of aerobic exercise without signs/symptoms of physical distress.   Intensity  THRR unchanged  THRR unchanged  THRR unchanged  THRR unchanged       Progression    Row Name 11/05/17 1413 11/14/17 1415 11/19/17 1506 12/12/17 1458   Progression  Continue to progress workloads to maintain intensity without signs/symptoms of physical distress.  Continue to progress workloads to maintain intensity without signs/symptoms of physical distress.  Continue to progress workloads to maintain intensity without signs/symptoms of physical distress.  Continue to progress workloads to maintain intensity without signs/symptoms of physical distress.   Average METs  3.14  3.71   3.77  3.92       Resistance Training    Row Name 11/05/17 1413 11/14/17 1415 11/19/17 1506 12/12/17 1458   Training Prescription  Yes  No  Yes  No   Weight  3lbs  no documentation  3lbs  no documentation   Reps  10-15  no documentation  10-15  no documentation   Time  no documentation  no documentation  10 Minutes  no documentation       Interval Training    Row Name 11/05/17 1413 11/14/17 1415 11/19/17 1506 12/12/17 1458   Interval Training  No  No  No  No       Treadmill    Row Name 11/05/17 1413 11/14/17 1415 11/19/17 1506 12/12/17 1458   MPH  3  3.4  3.4  3.4   Grade  '1  2  2  2   '$ Minutes  '10  10  10  10   '$ METs  3.71  4.54  4.54  4.54       Bike    Row Name 11/05/17 1413 11/14/17 1415 11/19/17 1506 12/12/17 1458   Level  1.2  1.2  1.2  1.2   Minutes  '10  10  10  10   '$ METs  3.38  3.38  3.38  3.32       NuStep    Row Name 11/05/17 1413 11/14/17 1415 11/19/17 1506 12/12/17 1458   Level  '2  4  4  4   '$ SPM  75  95  95  95   Minutes  '10  10  10  10   '$ METs  2.3  3.2  3.4  3.9       Home Exercise Plan    Row Name 11/05/17 1413 11/14/17 1415 11/19/17 1506 12/12/17 1458   Plans to continue exercise at  no documentation  no documentation  Home (comment) Walking  Home (comment) Walking   Frequency  no documentation  no documentation  Add 3 additional days to program exercise sessions.  Add 3 additional days to program exercise sessions.   Initial Home Exercises Provided  no documentation  no documentation  11/23/17  11/23/17          Exercise Comments: Exercise Comments    Row Name 11/05/17 1412 11/23/17 1103 12/12/17 1500   Exercise Comments  Pt's first day of exericse. Pt oriented to exercise equipment. Pt responded well to exercise. Will continue to monitor.   Reviewed HEP with pt. Pt is currently exercsiing at home and will continue to monitor.   Pt has progress MET levels with each session. Is tolerating exercise well. Continues to exercise at home.       Exercise  Goals and Review: Exercise Goals    Exercise Goals    Row Name 10/25/17 1145   Increase Physical Activity  Yes   Intervention  Develop an individualized exercise prescription for aerobic and resistive training based on initial evaluation findings, risk stratification, comorbidities and participant's personal goals.;Provide advice, education, support and counseling about physical activity/exercise needs.   Expected Outcomes  Short Term: Attend rehab on a regular basis to increase amount of physical activity.;Long Term: Add in home exercise to make exercise part of routine and to increase amount of physical activity.;Long Term: Exercising regularly at least 3-5 days a week.   Increase Strength and Stamina  Yes   Intervention  Develop an individualized exercise prescription for aerobic and resistive training based on initial evaluation findings, risk stratification, comorbidities and participant's personal goals.;Provide advice, education, support and counseling about physical activity/exercise needs.   Expected Outcomes  Short Term: Increase workloads from initial exercise prescription for resistance, speed, and METs.;Short Term: Perform resistance training exercises routinely during rehab and add in resistance training at home;Long Term: Improve cardiorespiratory fitness, muscular endurance and strength as measured by increased METs and functional capacity (6MWT)   Able to understand and use rate of perceived exertion (RPE) scale  Yes   Intervention  Provide education and explanation on how to use RPE scale   Expected Outcomes  Short Term: Able to use RPE daily in rehab to express subjective intensity level;Long Term:  Able to use RPE to guide intensity level when exercising independently   Knowledge and understanding of Target Heart Rate Range (THRR)  Yes   Intervention  Provide education and explanation of THRR including how the numbers were predicted and where they are located for reference    Expected Outcomes  Short Term: Able to state/look up THRR;Long Term: Able to use THRR to govern intensity when exercising independently;Short Term: Able to use daily as guideline for intensity in rehab   Able to check pulse independently  Yes   Intervention  Review the importance of being able to check your own pulse for safety during independent exercise;Provide education and demonstration on how to check pulse in carotid and radial arteries.   Expected Outcomes  Short Term: Able to explain why pulse checking is important during independent exercise;Long Term: Able to check pulse independently and accurately   Understanding of Exercise Prescription  Yes   Intervention  Provide education, explanation, and written materials on patient's individual exercise prescription   Expected Outcomes  Short Term: Able to explain program exercise prescription;Long Term: Able to explain home exercise prescription to exercise independently   Improve claudication pain toleration; Improve walking ability  Yes   Intervention  Participate in PAD/SET Rehab 2-3 days a week, walking at home as part of exercise prescription;Attend education sessions to aid in risk factor modification and understanding of disease process   Expected Outcomes  Short Term: Improve walking distance/time to onset of claudication pain;Long Term: Improve score of PAD questionnaires;Long Term: Improve walking ability and toleration to claudication          Exercise Goals Re-Evaluation : Exercise Goals Re-Evaluation    Exercise Goal Re-Evaluation    Row Name 11/23/17 1105 12/12/17 1459   Exercise Goals Review  Increase Physical Activity;Able to understand and use rate of perceived exertion (RPE) scale;Knowledge and understanding of Target Heart Rate Range (THRR);Understanding of Exercise Prescription;Increase Strength and Stamina;Able to check pulse independently  Increase Physical Activity;Able to understand and use rate of perceived exertion  (RPE) scale;Knowledge and understanding of Target Heart Rate Range (THRR);Understanding of Exercise Prescription;Increase Strength and Stamina;Able to check pulse independently   Comments  Reviewed HEP with patient also reviewed THRR, NTG use, RPE scale, end points of exercise, and weather conditions.   Reviewed METs and goal with Pt. MET level has increased to 3.92. Continues to progress and tolerate exercise well.    Expected Outcomes  Patient will continue to walk 4 days a week for 45- 60 minutes per day. Patient will continue to increase stamina. Will continue to monitor and progress patient as tolerated.   Will continue to progress and monitor Pt.           Discharge Exercise Prescription (Final Exercise Prescription Changes): Exercise Prescription Changes - 12/12/17 1458    Response to Exercise          Blood Pressure (Admit)  132/80    Blood Pressure (Exercise)  152/84    Blood Pressure (Exit)  120/80    Heart Rate (Admit)  60 bpm    Heart Rate (Exercise)  123 bpm    Heart Rate (Exit)  70 bpm    Rating of Perceived Exertion (Exercise)  11    Perceived Dyspnea (Exercise)  0    Symptoms  None    Duration  Continue with 30 min of aerobic exercise without signs/symptoms of physical distress.    Intensity  THRR unchanged        Progression          Progression  Continue to progress workloads to maintain intensity without signs/symptoms of physical distress.  Average METs  3.92        Resistance Training          Training Prescription  No        Interval Training          Interval Training  No        Treadmill          MPH  3.4    Grade  2    Minutes  10    METs  4.54        Bike          Level  1.2    Minutes  10    METs  3.32        NuStep          Level  4    SPM  95    Minutes  10    METs  3.9        Home Exercise Plan          Plans to continue exercise at  Home (comment)   Walking   Frequency  Add 3 additional days to program exercise  sessions.    Initial Home Exercises Provided  11/23/17           Nutrition:  Target Goals: Understanding of nutrition guidelines, daily intake of sodium '1500mg'$ , cholesterol '200mg'$ , calories 30% from fat and 7% or less from saturated fats, daily to have 5 or more servings of fruits and vegetables.  Biometrics: Pre Biometrics - 10/25/17 1145    Pre Biometrics          Height  '5\' 11"'$  (1.803 m)    Weight  93.4 kg    Waist Circumference  42 inches    Hip Circumference  40 inches    Waist to Hip Ratio  1.05 %    BMI (Calculated)  28.73    Triceps Skinfold  16 mm    % Body Fat  28.8 %    Grip Strength  43 kg    Flexibility  10.5 in    Single Leg Stand  2.47 seconds            Nutrition Therapy Plan and Nutrition Goals: Nutrition Therapy & Goals - 10/25/17 0838    Nutrition Therapy          Diet  carb modified, heart healthy        Personal Nutrition Goals          Nutrition Goal  Pt to identify and limit food sources of saturated fat, trans fat, refined carbohydrates and sodium    Personal Goal #2  Improved blood glucose control as evidenced by pt's A1c trending from 7.6 toward less than 7.0.    Personal Goal #3  Pt able to name foods that affect blood glucose        Intervention Plan          Intervention  Prescribe, educate and counsel regarding individualized specific dietary modifications aiming towards targeted core components such as weight, hypertension, lipid management, diabetes, heart failure and other comorbidities.    Expected Outcomes  Short Term Goal: Understand basic principles of dietary content, such as calories, fat, sodium, cholesterol and nutrients.;Long Term Goal: Adherence to prescribed nutrition plan.           Nutrition Assessments: Nutrition Assessments - 10/25/17 0839    MEDFICTS Scores          Pre Score  32  Nutrition Goals Re-Evaluation:   Nutrition Goals Re-Evaluation:   Nutrition Goals Discharge (Final Nutrition  Goals Re-Evaluation):   Psychosocial: Target Goals: Acknowledge presence or absence of significant depression and/or stress, maximize coping skills, provide positive support system. Participant is able to verbalize types and ability to use techniques and skills needed for reducing stress and depression.  Initial Review & Psychosocial Screening: Initial Psych Review & Screening - 10/25/17 1233    Initial Review          Current issues with  None Identified        Family Dynamics          Good Support System?  Yes   Rajvir has his wife and children for support       Barriers          Psychosocial barriers to participate in program  There are no identifiable barriers or psychosocial needs.        Screening Interventions          Interventions  Encouraged to exercise           Quality of Life Scores: Quality of Life - 10/25/17 1141    Quality of Life          Select  Quality of Life        Quality of Life Scores          Health/Function Pre  27.07 %    Socioeconomic Pre  26.86 %    Psych/Spiritual Pre  24.93 %    Family Pre  27.6 %    GLOBAL Pre  26.66 %          Scores of 19 and below usually indicate a poorer quality of life in these areas.  A difference of  2-3 points is a clinically meaningful difference.  A difference of 2-3 points in the total score of the Quality of Life Index has been associated with significant improvement in overall quality of life, self-image, physical symptoms, and general health in studies assessing change in quality of life.  PHQ-9: Recent Review Flowsheet Data    Depression screen [facility] 2/9 11/05/2017   Decreased Interest 0   Down, Depressed, Hopeless 0   PHQ - 2 Score 0     Interpretation of Total Score  Total Score Depression Severity:  1-4 = Minimal depression, 5-9 = Mild depression, 10-14 = Moderate depression, 15-19 = Moderately severe depression, 20-27 = Severe depression   Psychosocial Evaluation and  Intervention: Psychosocial Evaluation - 11/05/17 1505    Psychosocial Evaluation & Interventions          Interventions  Encouraged to exercise with the program and follow exercise prescription    Comments  no psychosocial needs identified, no interventions necessary. pt enjoys riding his motorcylce and is eager to resume this activity.     Expected Outcomes  pt will exhibit positive outlook with good coping skills.     Continue Psychosocial Services   No Follow up required           Psychosocial Re-Evaluation: Psychosocial Re-Evaluation    Psychosocial Re-Evaluation    Row Name 11/15/17 1516 12/13/17 1106   Current issues with  None Identified  None Identified   Comments  no psychosocial needs identified, no interventions necessary   no psychosocial needs identified, no interventions necessary    Expected Outcomes  pt will exhibit positie outlook with good coping skills.   pt will exhibit positie outlook with good coping skills.  Interventions  Encouraged to attend Cardiac Rehabilitation for the exercise  Encouraged to attend Cardiac Rehabilitation for the exercise   Continue Psychosocial Services   No Follow up required  No Follow up required          Psychosocial Discharge (Final Psychosocial Re-Evaluation): Psychosocial Re-Evaluation - 12/13/17 1106    Psychosocial Re-Evaluation          Current issues with  None Identified    Comments  no psychosocial needs identified, no interventions necessary     Expected Outcomes  pt will exhibit positie outlook with good coping skills.     Interventions  Encouraged to attend Cardiac Rehabilitation for the exercise    Continue Psychosocial Services   No Follow up required           Vocational Rehabilitation: Provide vocational rehab assistance to qualifying candidates.   Vocational Rehab Evaluation & Intervention: Vocational Rehab - 10/25/17 1235    Initial Vocational Rehab Evaluation & Intervention          Assessment shows  need for Vocational Rehabilitation  No           Education: Education Goals: Education classes will be provided on a weekly basis, covering required topics. Participant will state understanding/return demonstration of topics presented.  Learning Barriers/Preferences: Learning Barriers/Preferences - 10/25/17 1141    Learning Barriers/Preferences          Learning Barriers  Sight    Learning Preferences  Verbal Instruction;Individual Instruction;Pictoral           Education Topics: Count Your Pulse:  -Group instruction provided by verbal instruction, demonstration, patient participation and written materials to support subject.  Instructors address importance of being able to find your pulse and how to count your pulse when at home without a heart monitor.  Patients get hands on experience counting their pulse with staff help and individually. Flowsheet Row CARDIAC REHAB PHASE II EXERCISE from 12/07/2017 in [place]  Date  12/07/17  Instruction Review Code  2- Demonstrated Understanding      Heart Attack, Angina, and Risk Factor Modification:  -Group instruction provided by verbal instruction, video, and written materials to support subject.  Instructors address signs and symptoms of angina and heart attacks.    Also discuss risk factors for heart disease and how to make changes to improve heart health risk factors. Flowsheet Row CARDIAC REHAB PHASE II EXERCISE from 12/07/2017 in [place]  Date  11/14/17  Instruction Review Code  2- Demonstrated Understanding      Functional Fitness:  -Group instruction provided by verbal instruction, demonstration, patient participation, and written materials to support subject.  Instructors address safety measures for doing things around the house.  Discuss how to get up and down off the floor, how to pick things up properly, how to safely get out of a chair without assistance,  and balance training.   Meditation and Mindfulness:  -Group instruction provided by verbal instruction, patient participation, and written materials to support subject.  Instructor addresses importance of mindfulness and meditation practice to help reduce stress and improve awareness.  Instructor also leads participants through a meditation exercise.  Flowsheet Row CARDIAC REHAB PHASE II EXERCISE from 12/07/2017 in [place]  Date  12/05/17  Educator  Jeanella Craze  Instruction Review Code  2- Demonstrated Understanding      Stretching for Flexibility and Mobility:  -Group instruction provided by verbal instruction, patient participation,  and written materials to support subject.  Instructors lead participants through series of stretches that are designed to increase flexibility thus improving mobility.  These stretches are additional exercise for major muscle groups that are typically performed during regular warm up and cool down.   Hands Only CPR:  -Group verbal, video, and participation provides a basic overview of AHA guidelines for community CPR. Role-play of emergencies allow participants the opportunity to practice calling for help and chest compression technique with discussion of AED use.   Hypertension: -Group verbal and written instruction that provides a basic overview of hypertension including the most recent diagnostic guidelines, risk factor reduction with self-care instructions and medication management. Flowsheet Row CARDIAC REHAB PHASE II EXERCISE from 12/07/2017 in [facility]  Date  11/23/17  Educator  RN  Instruction Review Code  2- Demonstrated Understanding       Nutrition I class: Heart Healthy Eating:  -Group instruction provided by PowerPoint slides, verbal discussion, and written materials to support subject matter. The instructor gives an explanation and review of the Therapeutic Lifestyle  Changes diet recommendations, which includes a discussion on lipid goals, dietary fat, sodium, fiber, plant stanol/sterol esters, sugar, and the components of a well-balanced, healthy diet.   Nutrition II class: Lifestyle Skills:  -Group instruction provided by PowerPoint slides, verbal discussion, and written materials to support subject matter. The instructor gives an explanation and review of label reading, grocery shopping for heart health, heart healthy recipe modifications, and ways to make healthier choices when eating out.   Diabetes Question & Answer:  -Group instruction provided by PowerPoint slides, verbal discussion, and written materials to support subject matter. The instructor gives an explanation and review of diabetes co-morbidities, pre- and post-prandial blood glucose goals, pre-exercise blood glucose goals, signs, symptoms, and treatment of hypoglycemia and hyperglycemia, and foot care basics.   Diabetes Blitz:  -Group instruction provided by PowerPoint slides, verbal discussion, and written materials to support subject matter. The instructor gives an explanation and review of the physiology behind type 1 and type 2 diabetes, diabetes medications and rational behind using different medications, pre- and post-prandial blood glucose recommendations and Hemoglobin A1c goals, diabetes diet, and exercise including blood glucose guidelines for exercising safely.    Portion Distortion:  -Group instruction provided by PowerPoint slides, verbal discussion, written materials, and food models to support subject matter. The instructor gives an explanation of serving size versus portion size, changes in portions sizes over the last 20 years, and what consists of a serving from each food group.   Stress Management:  -Group instruction provided by verbal instruction, video, and written materials to support subject matter.  Instructors review role of stress in heart disease and how to cope  with stress positively.     Exercising on Your Own:  -Group instruction provided by verbal instruction, power point, and written materials to support subject.  Instructors discuss benefits of exercise, components of exercise, frequency and intensity of exercise, and end points for exercise.  Also discuss use of nitroglycerin and activating EMS.  Review options of places to exercise outside of rehab.  Review guidelines for sex with heart disease.   Cardiac Drugs I:  -Group instruction provided by verbal instruction and written materials to support subject.  Instructor reviews cardiac drug classes: antiplatelets, anticoagulants, beta blockers, and statins.  Instructor discusses reasons, side effects, and lifestyle considerations for each drug class.   Cardiac Drugs II:  -Group instruction provided by verbal instruction and written  materials to support subject.  Instructor reviews cardiac drug classes: angiotensin converting enzyme inhibitors (ACE-I), angiotensin II receptor blockers (ARBs), nitrates, and calcium channel blockers.  Instructor discusses reasons, side effects, and lifestyle considerations for each drug class.   Anatomy and Physiology of the Circulatory System:  Group verbal and written instruction and models provide basic cardiac anatomy and physiology, with the coronary electrical and arterial systems. Review of: AMI, Angina, Valve disease, Heart Failure, Peripheral Artery Disease, Cardiac Arrhythmia, Pacemakers, and the ICD. Flowsheet Row CARDIAC REHAB PHASE II EXERCISE from 12/07/2017 in [place]  Date  11/07/17  Instruction Review Code  2- Demonstrated Understanding      Other Education:  -Group or individual verbal, written, or video instructions that support the educational goals of the cardiac rehab program.   Holiday Eating Survival Tips:  -Group instruction provided by PowerPoint slides, verbal discussion, and written materials to  support subject matter. The instructor gives patients tips, tricks, and techniques to help them not only survive but enjoy the holidays despite the onslaught of food that accompanies the holidays.   Knowledge Questionnaire Score: Knowledge Questionnaire Score - 10/25/17 1139    Knowledge Questionnaire Score          Pre Score  21/24           Core Components/Risk Factors/Patient Goals at Admission: Personal Goals and Risk Factors at Admission - 10/25/17 1143    Core Components/Risk Factors/Patient Goals on Admission           Weight Management  Yes;Weight Loss;Obesity    Intervention  Weight Management: Develop a combined nutrition and exercise program designed to reach desired caloric intake, while maintaining appropriate intake of nutrient and fiber, sodium and fats, and appropriate energy expenditure required for the weight goal.;Weight Management: Provide education and appropriate resources to help participant work on and attain dietary goals.;Weight Management/Obesity: Establish reasonable short term and long term weight goals.;Obesity: Provide education and appropriate resources to help participant work on and attain dietary goals.    Admit Weight  205 lb 14.6 oz (93.4 kg)    Goal Weight: Short Term  200 lb (90.7 kg)    Goal Weight: Long Term  190 lb (86.2 kg)    Expected Outcomes  Short Term: Continue to assess and modify interventions until short term weight is achieved;Long Term: Adherence to nutrition and physical activity/exercise program aimed toward attainment of established weight goal;Weight Loss: Understanding of general recommendations for a balanced deficit meal plan, which promotes 1-2 lb weight loss per week and includes a negative energy balance of [phone] kcal/d;Understanding recommendations for meals to include 15-35% energy as protein, 25-35% energy from fat, 35-60% energy from carbohydrates, less than '200mg'$  of dietary cholesterol, 20-35 gm of total fiber  daily;Understanding of distribution of calorie intake throughout the day with the consumption of 4-5 meals/snacks    Diabetes  Yes    Intervention  Provide education about signs/symptoms and action to take for hypo/hyperglycemia.;Provide education about proper nutrition, including hydration, and aerobic/resistive exercise prescription along with prescribed medications to achieve blood glucose in normal ranges: Fasting glucose 65-99 mg/dL    Expected Outcomes  Long Term: Attainment of HbA1C < 7%.;Short Term: Participant verbalizes understanding of the signs/symptoms and immediate care of hyper/hypoglycemia, proper foot care and importance of medication, aerobic/resistive exercise and nutrition plan for blood glucose control.    Hypertension  Yes    Intervention  Provide education on lifestyle modifcations including regular physical activity/exercise, weight management,  moderate sodium restriction and increased consumption of fresh fruit, vegetables, and low fat dairy, alcohol moderation, and smoking cessation.;Monitor prescription use compliance.    Expected Outcomes  Short Term: Continued assessment and intervention until BP is < 140/49m HG in hypertensive participants. < 130/831mHG in hypertensive participants with diabetes, heart failure or chronic kidney disease.;Long Term: Maintenance of blood pressure at goal levels.    Lipids  Yes    Intervention  Provide education and support for participant on nutrition & aerobic/resistive exercise along with prescribed medications to achieve LDL '70mg'$ , HDL >'40mg'$ .    Expected Outcomes  Short Term: Participant states understanding of desired cholesterol values and is compliant with medications prescribed. Participant is following exercise prescription and nutrition guidelines.;Long Term: Cholesterol controlled with medications as prescribed, with individualized exercise RX and with personalized nutrition plan. Value goals: LDL < '70mg'$ , HDL > 40 mg.            Core Components/Risk Factors/Patient Goals Review:  Goals and Risk Factor Review    Core Components/Risk Factors/Patient Goals Review    Row Name 11/05/17 1509 12/13/17 0748   Personal Goals Review  Weight Management/Obesity;Diabetes;Hypertension;Lipids  Weight Management/Obesity;Diabetes;Hypertension;Lipids   Review  pt with multiple CAD RF demonstrates eagerness to participate in CR program. pt personal goal is to resume previous activities including riding his [employer]. pt is following MD recommendation and is not riding.  pt recent nosebleed hsa further decreased his strength/stamina.  pt eager to regain energy.   pt with multiple CAD RF demonstrates eagerness to participate in CR program. pt personal goal is to resume previous activities including riding his [employer]. pt is enjoying outside activities wtih increaed strength and endurance., pt is walking 1.25 miles at home.     Expected Outcomes  pt will participate in CR exercise, nutrition and lifestyle modification opportunities to reduce overall RF.   pt will participate in CR exercise, nutrition and lifestyle modification opportunities to reduce overall RF.           Core Components/Risk Factors/Patient Goals at Discharge (Final Review):  Goals and Risk Factor Review - 12/13/17 0748    Core Components/Risk Factors/Patient Goals Review          Personal Goals Review  Weight Management/Obesity;Diabetes;Hypertension;Lipids    Review  pt with multiple CAD RF demonstrates eagerness to participate in CR program. pt personal goal is to resume previous activities including riding his [employer]. pt is enjoying outside activities wtih increaed strength and endurance., pt is walking 1.25 miles at home.      Expected Outcomes  pt will participate in CR exercise, nutrition and lifestyle modification opportunities to reduce overall RF.            ITP Comments: ITP Comments    Row Name 10/25/17 1135 11/05/17  1213 11/15/17 1515 12/13/17 0747   ITP Comments  Dr. TrFransico HimMedical Director   pt started group exercise today. pt tolerated light activity without difficulty. pt oriented to exercise equipment and safety routine.   30 day ITP review.    30 day ITP review.  pt with good attendance and participation      Comments:

## 2017-12-14 ENCOUNTER — Encounter (HOSPITAL_COMMUNITY)
Admission: RE | Admit: 2017-12-14 | Discharge: 2017-12-14 | Disposition: A | Discharge status: Home / Self Care | Payer: PPO | Source: Ambulatory Visit | Attending: Cardiology | Admitting: Cardiology

## 2017-12-14 ENCOUNTER — Ambulatory Visit (HOSPITAL_COMMUNITY): Payer: Self-pay

## 2017-12-14 DIAGNOSIS — Z7982 Long term (current) use of aspirin: Secondary | ICD-10-CM | POA: Diagnosis not present

## 2017-12-14 DIAGNOSIS — Z7901 Long term (current) use of anticoagulants: Secondary | ICD-10-CM | POA: Insufficient documentation

## 2017-12-14 DIAGNOSIS — Z8249 Family history of ischemic heart disease and other diseases of the circulatory system: Secondary | ICD-10-CM | POA: Insufficient documentation

## 2017-12-14 DIAGNOSIS — Z7984 Long term (current) use of oral hypoglycemic drugs: Secondary | ICD-10-CM | POA: Diagnosis not present

## 2017-12-14 DIAGNOSIS — Z79899 Other long term (current) drug therapy: Secondary | ICD-10-CM | POA: Diagnosis not present

## 2017-12-14 DIAGNOSIS — E7800 Pure hypercholesterolemia, unspecified: Secondary | ICD-10-CM | POA: Insufficient documentation

## 2017-12-14 DIAGNOSIS — E119 Type 2 diabetes mellitus without complications: Secondary | ICD-10-CM | POA: Diagnosis not present

## 2017-12-14 DIAGNOSIS — Z951 Presence of aortocoronary bypass graft: Secondary | ICD-10-CM | POA: Diagnosis not present

## 2017-12-14 DIAGNOSIS — I2510 Atherosclerotic heart disease of native coronary artery without angina pectoris: Secondary | ICD-10-CM | POA: Insufficient documentation

## 2017-12-14 DIAGNOSIS — I10 Essential (primary) hypertension: Secondary | ICD-10-CM | POA: Diagnosis not present

## 2017-12-14 DIAGNOSIS — Z87442 Personal history of urinary calculi: Secondary | ICD-10-CM | POA: Diagnosis not present

## 2017-12-17 ENCOUNTER — Encounter (HOSPITAL_COMMUNITY)
Admission: RE | Admit: 2017-12-17 | Discharge: 2017-12-17 | Disposition: A | Discharge status: Home / Self Care | Payer: PPO | Source: Ambulatory Visit | Attending: Cardiology | Admitting: Cardiology

## 2017-12-17 ENCOUNTER — Ambulatory Visit (HOSPITAL_COMMUNITY): Payer: Self-pay

## 2017-12-17 DIAGNOSIS — Z951 Presence of aortocoronary bypass graft: Secondary | ICD-10-CM

## 2017-12-19 ENCOUNTER — Encounter (HOSPITAL_COMMUNITY)
Admission: RE | Admit: 2017-12-19 | Discharge: 2017-12-19 | Disposition: A | Discharge status: Home / Self Care | Payer: PPO | Source: Ambulatory Visit | Attending: Cardiology | Admitting: Cardiology

## 2017-12-19 ENCOUNTER — Ambulatory Visit (HOSPITAL_COMMUNITY): Payer: Self-pay

## 2017-12-19 DIAGNOSIS — Z951 Presence of aortocoronary bypass graft: Secondary | ICD-10-CM | POA: Diagnosis not present

## 2017-12-21 ENCOUNTER — Encounter (HOSPITAL_COMMUNITY)
Admission: RE | Admit: 2017-12-21 | Discharge: 2017-12-21 | Disposition: A | Discharge status: Home / Self Care | Payer: PPO | Source: Ambulatory Visit | Attending: Cardiology | Admitting: Cardiology

## 2017-12-21 ENCOUNTER — Ambulatory Visit (HOSPITAL_COMMUNITY): Payer: Self-pay

## 2017-12-21 DIAGNOSIS — Z951 Presence of aortocoronary bypass graft: Secondary | ICD-10-CM | POA: Diagnosis not present

## 2017-12-24 ENCOUNTER — Ambulatory Visit (HOSPITAL_COMMUNITY): Payer: Self-pay

## 2017-12-24 ENCOUNTER — Encounter (HOSPITAL_COMMUNITY)
Admission: RE | Admit: 2017-12-24 | Discharge: 2017-12-24 | Disposition: A | Discharge status: Home / Self Care | Payer: PPO | Source: Ambulatory Visit | Attending: Cardiology | Admitting: Cardiology

## 2017-12-24 DIAGNOSIS — Z951 Presence of aortocoronary bypass graft: Secondary | ICD-10-CM

## 2017-12-26 ENCOUNTER — Ambulatory Visit (HOSPITAL_COMMUNITY): Payer: Self-pay

## 2017-12-26 ENCOUNTER — Encounter (HOSPITAL_COMMUNITY)
Admission: RE | Admit: 2017-12-26 | Discharge: 2017-12-26 | Disposition: A | Discharge status: Home / Self Care | Payer: PPO | Source: Ambulatory Visit | Attending: Cardiology | Admitting: Cardiology

## 2017-12-26 DIAGNOSIS — R040 Epistaxis: Secondary | ICD-10-CM | POA: Diagnosis not present

## 2017-12-26 DIAGNOSIS — Z951 Presence of aortocoronary bypass graft: Secondary | ICD-10-CM

## 2017-12-27 ENCOUNTER — Telehealth: Payer: Self-pay | Admitting: Cardiology

## 2017-12-27 NOTE — Telephone Encounter (Signed)
     Primary Cardiologist: [name], MD  Chart reviewed as part of pre-operative protocol coverage. Given past medical history and time since last visit, based on ACC/AHA guidelines, Taylor [name] would be at acceptable risk for the planned procedure without further cardiovascular testing.   Pt was on Xarelto but appears to have been stopped.  No need for pre dental abx.  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  Even if pt on Xarelto no need to stop for cleaning.    I will route this recommendation to the requesting party via Epic fax function and remove from pre-op pool.  Please call with questions.  Cecilie Kicks, NP 12/27/2017, 2:05 PM

## 2017-12-27 NOTE — Telephone Encounter (Signed)
1. What dental office are you calling from? [facility]  2. What is your office phone number? [phone]  3. What is your fax number? [phone]  4. What type of procedure is the patient having performed? cleaning  5. What date is procedure scheduled or is the patient there now? No  6. What is your question (ex. Antibiotics prior to procedure, holding medication-we need to know how long dentist wants pt to hold med)?  Need to know if there are any medication that need to held if so which ones. Doses patient need to take  anything

## 2017-12-28 ENCOUNTER — Ambulatory Visit (HOSPITAL_COMMUNITY): Payer: Self-pay

## 2017-12-28 ENCOUNTER — Encounter (HOSPITAL_COMMUNITY)
Admission: RE | Admit: 2017-12-28 | Discharge: 2017-12-28 | Disposition: A | Discharge status: Home / Self Care | Payer: PPO | Source: Ambulatory Visit | Attending: Cardiology | Admitting: Cardiology

## 2017-12-28 DIAGNOSIS — Z951 Presence of aortocoronary bypass graft: Secondary | ICD-10-CM

## 2017-12-31 ENCOUNTER — Ambulatory Visit (HOSPITAL_COMMUNITY): Payer: Self-pay

## 2017-12-31 ENCOUNTER — Encounter (HOSPITAL_COMMUNITY)
Admission: RE | Admit: 2017-12-31 | Discharge: 2017-12-31 | Disposition: A | Discharge status: Home / Self Care | Payer: PPO | Source: Ambulatory Visit | Attending: Cardiology | Admitting: Cardiology

## 2017-12-31 DIAGNOSIS — Z951 Presence of aortocoronary bypass graft: Secondary | ICD-10-CM | POA: Diagnosis not present

## 2018-01-01 ENCOUNTER — Other Ambulatory Visit: Payer: Self-pay

## 2018-01-01 ENCOUNTER — Telehealth: Payer: Self-pay | Admitting: Cardiology

## 2018-01-01 ENCOUNTER — Ambulatory Visit (HOSPITAL_COMMUNITY): Payer: PPO | Attending: Cardiology

## 2018-01-01 DIAGNOSIS — D692 Other nonthrombocytopenic purpura: Secondary | ICD-10-CM | POA: Diagnosis not present

## 2018-01-01 DIAGNOSIS — I10 Essential (primary) hypertension: Secondary | ICD-10-CM | POA: Diagnosis not present

## 2018-01-01 DIAGNOSIS — N183 Chronic kidney disease, stage 3 (moderate): Secondary | ICD-10-CM | POA: Diagnosis not present

## 2018-01-01 DIAGNOSIS — I480 Paroxysmal atrial fibrillation: Secondary | ICD-10-CM | POA: Diagnosis not present

## 2018-01-01 DIAGNOSIS — Z6831 Body mass index (BMI) 31.0-31.9, adult: Secondary | ICD-10-CM | POA: Diagnosis not present

## 2018-01-01 DIAGNOSIS — E1129 Type 2 diabetes mellitus with other diabetic kidney complication: Secondary | ICD-10-CM | POA: Diagnosis not present

## 2018-01-01 DIAGNOSIS — I255 Ischemic cardiomyopathy: Secondary | ICD-10-CM | POA: Diagnosis not present

## 2018-01-01 DIAGNOSIS — Z7901 Long term (current) use of anticoagulants: Secondary | ICD-10-CM | POA: Diagnosis not present

## 2018-01-01 DIAGNOSIS — Z951 Presence of aortocoronary bypass graft: Secondary | ICD-10-CM | POA: Insufficient documentation

## 2018-01-01 DIAGNOSIS — R808 Other proteinuria: Secondary | ICD-10-CM | POA: Diagnosis not present

## 2018-01-01 DIAGNOSIS — E1165 Type 2 diabetes mellitus with hyperglycemia: Secondary | ICD-10-CM | POA: Diagnosis not present

## 2018-01-01 DIAGNOSIS — E7800 Pure hypercholesterolemia, unspecified: Secondary | ICD-10-CM | POA: Diagnosis not present

## 2018-01-01 DIAGNOSIS — I25810 Atherosclerosis of coronary artery bypass graft(s) without angina pectoris: Secondary | ICD-10-CM | POA: Diagnosis not present

## 2018-01-01 NOTE — Telephone Encounter (Signed)
  Taylor [name] is calling from [facility] to check back on the dental request that was done on 12/27/17. They never received the answer from Cecilie Kicks. Can we please resend. Fax: [phone]

## 2018-01-02 ENCOUNTER — Ambulatory Visit (HOSPITAL_COMMUNITY): Payer: Self-pay

## 2018-01-02 ENCOUNTER — Encounter (HOSPITAL_COMMUNITY)
Admission: RE | Admit: 2018-01-02 | Discharge: 2018-01-02 | Disposition: A | Discharge status: Home / Self Care | Payer: PPO | Source: Ambulatory Visit | Attending: Cardiology | Admitting: Cardiology

## 2018-01-02 DIAGNOSIS — Z951 Presence of aortocoronary bypass graft: Secondary | ICD-10-CM | POA: Diagnosis not present

## 2018-01-02 NOTE — Telephone Encounter (Signed)
Done.  Kerin Ransom PA-C 01/02/2018 4:17 PM

## 2018-01-03 NOTE — Progress Notes (Signed)
Cardiac Individual Treatment Plan  Patient Details  Name: Taylor [name] MRN: [id number] Date of Birth: [birth year] Referring Provider:   Flowsheet Row CARDIAC REHAB PHASE II ORIENTATION from 10/25/2017 in [place]  Referring Provider  Leonie Man MD       Initial Encounter Date:  [place] PHASE II ORIENTATION from 10/25/2017 in [place]  Date  10/25/17      Visit Diagnosis: S/P CABG x 4 09/06/17  Patient's Home Medications on Admission:  Current Outpatient Medications:  .  aspirin EC 81 MG tablet, Take 1 tablet (81 mg total) by mouth daily., Disp: 90 tablet, Rfl: 3 .  atorvastatin (LIPITOR) 80 MG tablet, Take 1 tablet (80 mg total) by mouth daily at 6 PM., Disp: 90 tablet, Rfl: 3 .  lisinopril (PRINIVIL,ZESTRIL) 10 MG tablet, TAKE 1 TABLET BY MOUTH EVERY DAY, Disp: 30 tablet, Rfl: 5 .  metoprolol succinate (TOPROL XL) 25 MG 24 hr tablet, Take 1 tablet (25 mg total) by mouth daily., Disp: 90 tablet, Rfl: 3 .  metoprolol tartrate (LOPRESSOR) 25 MG tablet, Take 1 tablet (25 mg total) by mouth as needed. TAKE FOR PROLONG INCREASE HEART RATE OR PALPATIONS, Disp: 20 tablet, Rfl: 6 .  Multiple Vitamin (MULTIVITAMIN) tablet, Take 1 tablet by mouth daily., Disp: , Rfl:  .  oxyCODONE-acetaminophen (PERCOCET) 5-325 MG tablet, Take 1 tablet by mouth every 4 (four) hours as needed for severe pain., Disp: 10 tablet, Rfl: 0 .  sitaGLIPtan-metformin (JANUMET) 50-500 MG per tablet, Take 1 tablet by mouth 2 (two) times daily with a meal. , Disp: , Rfl:   Past Medical History: Past Medical History:  Diagnosis Date  . Bigeminal rhythm 08/01/2017   per EKG 07-24-2017 in epic (per confirmation new since last ecg in 2007)  in care everywhere in epic ecg dated 2012 shows sinus brady rate 53 no pvc's --per pt asymptomatic and   . Bradycardia   . Coronary artery disease involving native coronary artery 08/2017    Abnormal Myoview 08/2017 (Abnormal EKG - Severe ST depressions, PVCs & nsVT). TID noted.  Moderate LAD territory Ischemic Perfusion defect --> Cath with extensive RCA (RPDA & RPAV), Cx & LAD disease --> CABG x 4  . Family history of premature coronary artery disease    Father - MI 75. Paternal uncle x 2 -1 with MI & other with CABG in 15s  . History of contusion    ED visit 08-04-2013 , 9 days past injury ,  dx concussion w/ LOC, per CT inferior frontal lobes contusion hemarrhage-- residual headache (08-01-2017  per pt residual resolved)  . History of kidney stones   . Hypercholesterolemia   . Hypertension   . Nephrolithiasis    bilateral nonobstructive per CT 07-24-2017  . S/P CABG x 4 09/06/2017   LIMA to LAD, SVG to OM2, Sequential SVG to PDA and RPL2, EVH via right thigh and leg  . Type 2 diabetes mellitus ([place])    followed by pcp  . Ureteral calculi    left  . Wears glasses   . Wears hearing aid in both ears     Tobacco Use: Social History   Tobacco Use  Smoking Status Never Smoker  Smokeless Tobacco Never Used    Labs: Recent Review Flowsheet Data    Labs for ITP Cardiac and Pulmonary Rehab Latest Ref Rng & Units 09/06/2017 09/06/2017 09/06/2017 09/06/2017 09/07/2017   Hemoglobin A1c 4.8 -  5.6 % - - - - -   PHART 7.350 - 7.450 7.354 7.351 7.339(L) - -   PCO2ART 32.0 - 48.0 mmHg 41.5 37.2 43.0 - -   HCO3 20.0 - 28.0 mmol/L 23.4 20.4 23.2 - -   TCO2 22 - 32 mmol/L _0 ACIDBASEDEF 0.0 - 2.0 mmol/L 2.0 5.0(H) 3.0(H) - -   O2SAT % 97.0 98.0 99.0 - -      Capillary Blood Glucose: Lab Results  Component Value Date   GLUCAP 126 (H) 11/29/2017   GLUCAP 154 (H) 11/29/2017   GLUCAP 129 (H) 09/11/2017   GLUCAP 130 (H) 09/10/2017   GLUCAP 101 (H) 09/10/2017     Exercise Target Goals: Exercise Program Goal: Individual exercise prescription set using results from initial 6 min walk test and THRR while considering  patient's activity barriers and safety.   Exercise  Prescription Goal: Initial exercise prescription builds to 30-45 minutes a day of aerobic activity, 2-3 days per week.  Home exercise guidelines will be given to patient during program as part of exercise prescription that the participant will acknowledge.  Activity Barriers & Risk Stratification: Activity Barriers & Cardiac Risk Stratification - 10/25/17 1145    Activity Barriers & Cardiac Risk Stratification          Activity Barriers  None    Cardiac Risk Stratification  High           6 Minute Walk: 6 Minute Walk    6 Minute Walk    Row Name 10/25/17 1144   Phase  Initial   Distance  1710 feet   Walk Time  6 minutes   # of Rest Breaks  0   MPH  3.24   METS  3.77   RPE  11   Perceived Dyspnea   0   VO2 Peak  13.2   Symptoms  No   Resting HR  63 bpm   Resting BP  110/70   Resting Oxygen Saturation   97 %   Exercise Oxygen Saturation  during 6 min walk  97 %   Max Ex. HR  89 bpm   Max Ex. BP  148/60   2 Minute Post BP  124/72          Oxygen Initial Assessment:   Oxygen Re-Evaluation:   Oxygen Discharge (Final Oxygen Re-Evaluation):   Initial Exercise Prescription: Initial Exercise Prescription - 10/25/17 1100    Date of Initial Exercise RX and Referring Provider          Date  10/25/17    Referring Provider  Leonie Man MD     Expected Discharge Date  01/18/18        Treadmill          MPH  3    Grade  1    Minutes  10    METs  3.71        Bike          Level  1.2    Minutes  10    METs  3.38        NuStep          Level  2    SPM  75    Minutes  10    METs  2.8        Prescription Details          Frequency (times per week)  3x    Duration  Progress to  30 minutes of continuous aerobic without signs/symptoms of physical distress        Intensity          THRR 40-80% of Max Heartrate  61-122    Ratings of Perceived Exertion  11-13    Perceived Dyspnea  0-4        Progression          Progression  Continue  progressive overload as per policy without signs/symptoms or physical distress.        Resistance Training          Training Prescription  Yes    Weight  3lbs    Reps  10-15           Perform Capillary Blood Glucose checks as needed.  Exercise Prescription Changes: Exercise Prescription Changes    Response to Exercise    Row Name 11/05/17 1413 11/14/17 1415 11/19/17 1506 12/12/17 1458 01/01/18 1445   Blood Pressure (Admit)  120/60  124/78  154/80  132/80  138/80   Blood Pressure (Exercise)  158/68  168/66  140/80  152/84  140/72   Blood Pressure (Exit)  120/62  126/72  120/70  120/80  124/72   Heart Rate (Admit)  84 bpm  65 bpm  62 bpm  60 bpm  57 bpm   Heart Rate (Exercise)  115 bpm  115 bpm  115 bpm  123 bpm  118 bpm   Heart Rate (Exit)  80 bpm  65 bpm  68 bpm  70 bpm  67 bpm   Rating of Perceived Exertion (Exercise)  _0 Perceived Dyspnea (Exercise)  0  0  0  0  0   Symptoms  None  None  None  None  None   Comments  Pt oriented to exercise equipment   no documentation  no documentation  no documentation  no documentation   Duration  Progress to 30 minutes of  aerobic without signs/symptoms of physical distress  Continue with 30 min of aerobic exercise without signs/symptoms of physical distress.  Continue with 30 min of aerobic exercise without signs/symptoms of physical distress.  Continue with 30 min of aerobic exercise without signs/symptoms of physical distress.  Continue with 30 min of aerobic exercise without signs/symptoms of physical distress.   Intensity  THRR unchanged  THRR unchanged  THRR unchanged  THRR unchanged  THRR unchanged       Progression    Row Name 11/05/17 1413 11/14/17 1415 11/19/17 1506 12/12/17 1458 01/01/18 1445   Progression  Continue to progress workloads to maintain intensity without signs/symptoms of physical distress.  Continue to progress workloads to maintain intensity without signs/symptoms of physical distress.  Continue to  progress workloads to maintain intensity without signs/symptoms of physical distress.  Continue to progress workloads to maintain intensity without signs/symptoms of physical distress.  Continue to progress workloads to maintain intensity without signs/symptoms of physical distress.   Average METs  3.14  3.71  3.77  3.92  4.62       Resistance Training    Row Name 11/05/17 1413 11/14/17 1415 11/19/17 1506 12/12/17 1458 01/01/18 1445   Training Prescription  Yes  No  Yes  No  Yes   Weight  3lbs  no documentation  3lbs  no documentation  5 lbs.    Reps  10-15  no documentation  10-15  no documentation  10-15   Time  no documentation  no  documentation  10 Minutes  no documentation  10 Minutes       Interval Training    Row Name 11/05/17 1413 11/14/17 1415 11/19/17 1506 12/12/17 1458 01/01/18 1445   Interval Training  No  No  No  No  No       Treadmill    Row Name 11/05/17 1413 11/14/17 1415 11/19/17 1506 12/12/17 1458 01/01/18 1445   MPH  3  3.4  3.4  3.4  3.4   Grade  _0 Minutes  _1 METs  3.71  4.54  4.54  4.54  4.54       Bike    Row Name 11/05/17 1413 11/14/17 1415 11/19/17 1506 12/12/17 1458 01/01/18 1445   Level  1.2  1.2  1.2  1.2  2.1   Minutes  _2 METs  3.38  3.38  3.38  3.32  4.93       NuStep    Row Name 11/05/17 1413 11/14/17 1415 11/19/17 1506 12/12/17 1458 01/01/18 1445   Level  _3 SPM  75  95  95  95  95   Minutes  _4 METs  2.3  3.2  3.4  3.9  4.4       Home Exercise Plan    Row Name 11/05/17 1413 11/14/17 1415 11/19/17 1506 12/12/17 1458 01/01/18 1445   Plans to continue exercise at  no documentation  no documentation  Home (comment) Walking  Home (comment) Walking  Home (comment) Walking   Frequency  no documentation  no documentation  Add 3 additional days to program exercise sessions.  Add 3 additional days to program exercise sessions.  Add 3 additional days to program exercise  sessions.   Initial Home Exercises Provided  no documentation  no documentation  11/23/17  11/23/17  11/23/17          Exercise Comments: Exercise Comments    Row Name 11/05/17 1412 11/23/17 1103 12/12/17 1500 01/02/18 0922   Exercise Comments  Pt's first day of exericse. Pt oriented to exercise equipment. Pt responded well to exercise. Will continue to monitor.   Reviewed HEP with pt. Pt is currently exercsiing at home and will continue to monitor.   Pt has progress MET levels with each session. Is tolerating exercise well. Continues to exercise at home.   Reviewed METs and goals with Pt. Will continue to exercise at home in addition to cardiac rehab.       Exercise Goals and Review: Exercise Goals    Exercise Goals    Row Name 10/25/17 1145   Increase Physical Activity  Yes   Intervention  Develop an individualized exercise prescription for aerobic and resistive training based on initial evaluation findings, risk stratification, comorbidities and participant's personal goals.;Provide advice, education, support and counseling about physical activity/exercise needs.   Expected Outcomes  Short Term: Attend rehab on a regular basis to increase amount of physical activity.;Long Term: Add in home exercise to make exercise part of routine and to increase amount of physical activity.;Long Term: Exercising regularly at least 3-5 days a week.   Increase Strength and Stamina  Yes   Intervention  Develop an individualized exercise prescription for aerobic and resistive training based  on initial evaluation findings, risk stratification, comorbidities and participant's personal goals.;Provide advice, education, support and counseling about physical activity/exercise needs.   Expected Outcomes  Short Term: Increase workloads from initial exercise prescription for resistance, speed, and METs.;Short Term: Perform resistance training exercises routinely during rehab and add in resistance training at home;Long  Term: Improve cardiorespiratory fitness, muscular endurance and strength as measured by increased METs and functional capacity (6MWT)   Able to understand and use rate of perceived exertion (RPE) scale  Yes   Intervention  Provide education and explanation on how to use RPE scale   Expected Outcomes  Short Term: Able to use RPE daily in rehab to express subjective intensity level;Long Term:  Able to use RPE to guide intensity level when exercising independently   Knowledge and understanding of Target Heart Rate Range (THRR)  Yes   Intervention  Provide education and explanation of THRR including how the numbers were predicted and where they are located for reference   Expected Outcomes  Short Term: Able to state/look up THRR;Long Term: Able to use THRR to govern intensity when exercising independently;Short Term: Able to use daily as guideline for intensity in rehab   Able to check pulse independently  Yes   Intervention  Review the importance of being able to check your own pulse for safety during independent exercise;Provide education and demonstration on how to check pulse in carotid and radial arteries.   Expected Outcomes  Short Term: Able to explain why pulse checking is important during independent exercise;Long Term: Able to check pulse independently and accurately   Understanding of Exercise Prescription  Yes   Intervention  Provide education, explanation, and written materials on patient's individual exercise prescription   Expected Outcomes  Short Term: Able to explain program exercise prescription;Long Term: Able to explain home exercise prescription to exercise independently   Improve claudication pain toleration; Improve walking ability  Yes   Intervention  Participate in PAD/SET Rehab 2-3 days a week, walking at home as part of exercise prescription;Attend education sessions to aid in risk factor modification and understanding of disease process   Expected Outcomes  Short Term: Improve  walking distance/time to onset of claudication pain;Long Term: Improve score of PAD questionnaires;Long Term: Improve walking ability and toleration to claudication          Exercise Goals Re-Evaluation : Exercise Goals Re-Evaluation    Exercise Goal Re-Evaluation    Row Name 11/23/17 1105 12/12/17 1459 01/02/18 0920   Exercise Goals Review  Increase Physical Activity;Able to understand and use rate of perceived exertion (RPE) scale;Knowledge and understanding of Target Heart Rate Range (THRR);Understanding of Exercise Prescription;Increase Strength and Stamina;Able to check pulse independently  Increase Physical Activity;Able to understand and use rate of perceived exertion (RPE) scale;Knowledge and understanding of Target Heart Rate Range (THRR);Understanding of Exercise Prescription;Increase Strength and Stamina;Able to check pulse independently  Increase Physical Activity;Increase Strength and Stamina;Able to understand and use rate of perceived exertion (RPE) scale;Knowledge and understanding of Target Heart Rate Range (THRR);Understanding of Exercise Prescription;Able to check pulse independently   Comments  Reviewed HEP with patient also reviewed THRR, NTG use, RPE scale, end points of exercise, and weather conditions.   Reviewed METs and goal with Pt. MET level has increased to 3.92. Continues to progress and tolerate exercise well.   Reviewed METs and goals with Pt. Pt continues to increase MET level. Has increased to 4.62. Continues to exercise at home by walking 1.5 miles 4 times per week.  Expected Outcomes  Patient will continue to walk 4 days a week for 45- 60 minutes per day. Patient will continue to increase stamina. Will continue to monitor and progress patient as tolerated.   Will continue to progress and monitor Pt.   Will continue to monitor and progress Pt as tolerated.           Discharge Exercise Prescription (Final Exercise Prescription Changes): Exercise Prescription  Changes - 01/01/18 1445    Response to Exercise          Blood Pressure (Admit)  138/80    Blood Pressure (Exercise)  140/72    Blood Pressure (Exit)  124/72    Heart Rate (Admit)  57 bpm    Heart Rate (Exercise)  118 bpm    Heart Rate (Exit)  67 bpm    Rating of Perceived Exertion (Exercise)  12    Perceived Dyspnea (Exercise)  0    Symptoms  None    Duration  Continue with 30 min of aerobic exercise without signs/symptoms of physical distress.    Intensity  THRR unchanged        Progression          Progression  Continue to progress workloads to maintain intensity without signs/symptoms of physical distress.    Average METs  4.62        Resistance Training          Training Prescription  Yes    Weight  5 lbs.     Reps  10-15    Time  10 Minutes        Interval Training          Interval Training  No        Treadmill          MPH  3.4    Grade  2    Minutes  10    METs  4.54        Bike          Level  2.1    Minutes  10    METs  4.93        NuStep          Level  4    SPM  95    Minutes  10    METs  4.4        Home Exercise Plan          Plans to continue exercise at  Home (comment)   Walking   Frequency  Add 3 additional days to program exercise sessions.    Initial Home Exercises Provided  11/23/17           Nutrition:  Target Goals: Understanding of nutrition guidelines, daily intake of sodium <1578m, cholesterol <202m calories 30% from fat and 7% or less from saturated fats, daily to have 5 or more servings of fruits and vegetables.  Biometrics: Pre Biometrics - 10/25/17 1145    Pre Biometrics          Height  5' 11" (1.803 m)    Weight  93.4 kg    Waist Circumference  42 inches    Hip Circumference  40 inches    Waist to Hip Ratio  1.05 %    BMI (Calculated)  28.73    Triceps Skinfold  16 mm    % Body Fat  28.8 %    Grip Strength  43 kg    Flexibility  10.5 in    Single  Leg Stand  2.47 seconds            Nutrition  Therapy Plan and Nutrition Goals: Nutrition Therapy & Goals - 10/25/17 0838    Nutrition Therapy          Diet  carb modified, heart healthy        Personal Nutrition Goals          Nutrition Goal  Pt to identify and limit food sources of saturated fat, trans fat, refined carbohydrates and sodium    Personal Goal #2  Improved blood glucose control as evidenced by pt's A1c trending from 7.6 toward less than 7.0.    Personal Goal #3  Pt able to name foods that affect blood glucose        Intervention Plan          Intervention  Prescribe, educate and counsel regarding individualized specific dietary modifications aiming towards targeted core components such as weight, hypertension, lipid management, diabetes, heart failure and other comorbidities.    Expected Outcomes  Short Term Goal: Understand basic principles of dietary content, such as calories, fat, sodium, cholesterol and nutrients.;Long Term Goal: Adherence to prescribed nutrition plan.           Nutrition Assessments: Nutrition Assessments - 10/25/17 0839    MEDFICTS Scores          Pre Score  32           Nutrition Goals Re-Evaluation:   Nutrition Goals Re-Evaluation:   Nutrition Goals Discharge (Final Nutrition Goals Re-Evaluation):   Psychosocial: Target Goals: Acknowledge presence or absence of significant depression and/or stress, maximize coping skills, provide positive support system. Participant is able to verbalize types and ability to use techniques and skills needed for reducing stress and depression.  Initial Review & Psychosocial Screening: Initial Psych Review & Screening - 10/25/17 1233    Initial Review          Current issues with  None Identified        Family Dynamics          Good Support System?  Yes   [place] has his wife and children for support       Barriers          Psychosocial barriers to participate in program  There are no identifiable barriers or psychosocial needs.         Screening Interventions          Interventions  Encouraged to exercise           Quality of Life Scores: Quality of Life - 10/25/17 1141    Quality of Life          Select  Quality of Life        Quality of Life Scores          Health/Function Pre  27.07 %    Socioeconomic Pre  26.86 %    Psych/Spiritual Pre  24.93 %    Family Pre  27.6 %    GLOBAL Pre  26.66 %          Scores of 19 and below usually indicate a poorer quality of life in these areas.  A difference of  2-3 points is a clinically meaningful difference.  A difference of 2-3 points in the total score of the Quality of Life Index has been associated with significant improvement in overall quality of life, self-image, physical symptoms, and general health in studies assessing change in quality of  life.  PHQ-9: Recent Review Flowsheet Data    Depression screen [facility] 2/9 11/05/2017   Decreased Interest 0   Down, Depressed, Hopeless 0   PHQ - 2 Score 0     Interpretation of Total Score  Total Score Depression Severity:  1-4 = Minimal depression, 5-9 = Mild depression, 10-14 = Moderate depression, 15-19 = Moderately severe depression, 20-27 = Severe depression   Psychosocial Evaluation and Intervention: Psychosocial Evaluation - 11/05/17 1505    Psychosocial Evaluation & Interventions          Interventions  Encouraged to exercise with the program and follow exercise prescription    Comments  no psychosocial needs identified, no interventions necessary. pt enjoys riding his motorcylce and is eager to resume this activity.     Expected Outcomes  pt will exhibit positive outlook with good coping skills.     Continue Psychosocial Services   No Follow up required           Psychosocial Re-Evaluation: Psychosocial Re-Evaluation    Psychosocial Re-Evaluation    Row Name 11/15/17 1516 12/13/17 1106 01/03/18 0729   Current issues with  None Identified  None Identified  None Identified   Comments  no  psychosocial needs identified, no interventions necessary   no psychosocial needs identified, no interventions necessary   no psychosocial needs identified, no interventions necessary    Expected Outcomes  pt will exhibit positie outlook with good coping skills.   pt will exhibit positie outlook with good coping skills.   pt will exhibit positie outlook with good coping skills.    Interventions  Encouraged to attend Cardiac Rehabilitation for the exercise  Encouraged to attend Cardiac Rehabilitation for the exercise  Encouraged to attend Cardiac Rehabilitation for the exercise   Continue Psychosocial Services   No Follow up required  No Follow up required  No Follow up required          Psychosocial Discharge (Final Psychosocial Re-Evaluation): Psychosocial Re-Evaluation - 01/03/18 0729    Psychosocial Re-Evaluation          Current issues with  None Identified    Comments  no psychosocial needs identified, no interventions necessary     Expected Outcomes  pt will exhibit positie outlook with good coping skills.     Interventions  Encouraged to attend Cardiac Rehabilitation for the exercise    Continue Psychosocial Services   No Follow up required           Vocational Rehabilitation: Provide vocational rehab assistance to qualifying candidates.   Vocational Rehab Evaluation & Intervention: Vocational Rehab - 10/25/17 1235    Initial Vocational Rehab Evaluation & Intervention          Assessment shows need for Vocational Rehabilitation  No           Education: Education Goals: Education classes will be provided on a weekly basis, covering required topics. Participant will state understanding/return demonstration of topics presented.  Learning Barriers/Preferences: Learning Barriers/Preferences - 10/25/17 1141    Learning Barriers/Preferences          Learning Barriers  Sight    Learning Preferences  Verbal Instruction;Individual Instruction;Pictoral            Education Topics: Count Your Pulse:  -Group instruction provided by verbal instruction, demonstration, patient participation and written materials to support subject.  Instructors address importance of being able to find your pulse and how to count your pulse when at home without a heart monitor.  Patients get  hands on experience counting their pulse with staff help and individually. Flowsheet Row CARDIAC REHAB PHASE II EXERCISE from 01/02/2018 in [place]  Date  12/07/17  Instruction Review Code  2- Demonstrated Understanding      Heart Attack, Angina, and Risk Factor Modification:  -Group instruction provided by verbal instruction, video, and written materials to support subject.  Instructors address signs and symptoms of angina and heart attacks.    Also discuss risk factors for heart disease and how to make changes to improve heart health risk factors. Flowsheet Row CARDIAC REHAB PHASE II EXERCISE from 01/02/2018 in [place]  Date  11/14/17  Instruction Review Code  2- Demonstrated Understanding      Functional Fitness:  -Group instruction provided by verbal instruction, demonstration, patient participation, and written materials to support subject.  Instructors address safety measures for doing things around the house.  Discuss how to get up and down off the floor, how to pick things up properly, how to safely get out of a chair without assistance, and balance training.   Meditation and Mindfulness:  -Group instruction provided by verbal instruction, patient participation, and written materials to support subject.  Instructor addresses importance of mindfulness and meditation practice to help reduce stress and improve awareness.  Instructor also leads participants through a meditation exercise.  Flowsheet Row CARDIAC REHAB PHASE II EXERCISE from 01/02/2018 in [place]  Date   12/05/17  Educator  Jeanella Craze  Instruction Review Code  2- Demonstrated Understanding      Stretching for Flexibility and Mobility:  -Group instruction provided by verbal instruction, patient participation, and written materials to support subject.  Instructors lead participants through series of stretches that are designed to increase flexibility thus improving mobility.  These stretches are additional exercise for major muscle groups that are typically performed during regular warm up and cool down.   Hands Only CPR:  -Group verbal, video, and participation provides a basic overview of AHA guidelines for community CPR. Role-play of emergencies allow participants the opportunity to practice calling for help and chest compression technique with discussion of AED use.   Hypertension: -Group verbal and written instruction that provides a basic overview of hypertension including the most recent diagnostic guidelines, risk factor reduction with self-care instructions and medication management. Flowsheet Row CARDIAC REHAB PHASE II EXERCISE from 01/02/2018 in [place]  Date  01/02/18  Educator  RN  Instruction Review Code  2- Demonstrated Understanding       Nutrition I class: Heart Healthy Eating:  -Group instruction provided by PowerPoint slides, verbal discussion, and written materials to support subject matter. The instructor gives an explanation and review of the Therapeutic Lifestyle Changes diet recommendations, which includes a discussion on lipid goals, dietary fat, sodium, fiber, plant stanol/sterol esters, sugar, and the components of a well-balanced, healthy diet.   Nutrition II class: Lifestyle Skills:  -Group instruction provided by PowerPoint slides, verbal discussion, and written materials to support subject matter. The instructor gives an explanation and review of label reading, grocery shopping for heart health, heart healthy recipe  modifications, and ways to make healthier choices when eating out.   Diabetes Question & Answer:  -Group instruction provided by PowerPoint slides, verbal discussion, and written materials to support subject matter. The instructor gives an explanation and review of diabetes co-morbidities, pre- and post-prandial blood glucose goals, pre-exercise blood glucose goals, signs, symptoms, and treatment of  hypoglycemia and hyperglycemia, and foot care basics.   Diabetes Blitz:  -Group instruction provided by PowerPoint slides, verbal discussion, and written materials to support subject matter. The instructor gives an explanation and review of the physiology behind type 1 and type 2 diabetes, diabetes medications and rational behind using different medications, pre- and post-prandial blood glucose recommendations and Hemoglobin A1c goals, diabetes diet, and exercise including blood glucose guidelines for exercising safely.    Portion Distortion:  -Group instruction provided by PowerPoint slides, verbal discussion, written materials, and food models to support subject matter. The instructor gives an explanation of serving size versus portion size, changes in portions sizes over the last 20 years, and what consists of a serving from each food group.   Stress Management:  -Group instruction provided by verbal instruction, video, and written materials to support subject matter.  Instructors review role of stress in heart disease and how to cope with stress positively.   Flowsheet Row CARDIAC REHAB PHASE II EXERCISE from 01/02/2018 in [place]  Date  12/19/17  Educator  RN  Instruction Review Code  2- Demonstrated Understanding      Exercising on Your Own:  -Group instruction provided by verbal instruction, power point, and written materials to support subject.  Instructors discuss benefits of exercise, components of exercise, frequency and intensity of exercise, and end  points for exercise.  Also discuss use of nitroglycerin and activating EMS.  Review options of places to exercise outside of rehab.  Review guidelines for sex with heart disease.   Cardiac Drugs I:  -Group instruction provided by verbal instruction and written materials to support subject.  Instructor reviews cardiac drug classes: antiplatelets, anticoagulants, beta blockers, and statins.  Instructor discusses reasons, side effects, and lifestyle considerations for each drug class. Flowsheet Row CARDIAC REHAB PHASE II EXERCISE from 01/02/2018 in [place]  Date  12/26/17  Educator  Pharmacist  Instruction Review Code  2- Demonstrated Understanding      Cardiac Drugs II:  -Group instruction provided by verbal instruction and written materials to support subject.  Instructor reviews cardiac drug classes: angiotensin converting enzyme inhibitors (ACE-I), angiotensin II receptor blockers (ARBs), nitrates, and calcium channel blockers.  Instructor discusses reasons, side effects, and lifestyle considerations for each drug class.   Anatomy and Physiology of the Circulatory System:  Group verbal and written instruction and models provide basic cardiac anatomy and physiology, with the coronary electrical and arterial systems. Review of: AMI, Angina, Valve disease, Heart Failure, Peripheral Artery Disease, Cardiac Arrhythmia, Pacemakers, and the ICD. Flowsheet Row CARDIAC REHAB PHASE II EXERCISE from 01/02/2018 in [place]  Date  11/07/17  Instruction Review Code  2- Demonstrated Understanding      Other Education:  -Group or individual verbal, written, or video instructions that support the educational goals of the cardiac rehab program.   Holiday Eating Survival Tips:  -Group instruction provided by PowerPoint slides, verbal discussion, and written materials to support subject matter. The instructor gives patients tips, tricks,  and techniques to help them not only survive but enjoy the holidays despite the onslaught of food that accompanies the holidays.   Knowledge Questionnaire Score: Knowledge Questionnaire Score - 10/25/17 1139    Knowledge Questionnaire Score          Pre Score  21/24           Core Components/Risk Factors/Patient Goals at Admission: Personal Goals and Risk Factors at Admission -  10/25/17 1143    Core Components/Risk Factors/Patient Goals on Admission           Weight Management  Yes;Weight Loss;Obesity    Intervention  Weight Management: Develop a combined nutrition and exercise program designed to reach desired caloric intake, while maintaining appropriate intake of nutrient and fiber, sodium and fats, and appropriate energy expenditure required for the weight goal.;Weight Management: Provide education and appropriate resources to help participant work on and attain dietary goals.;Weight Management/Obesity: Establish reasonable short term and long term weight goals.;Obesity: Provide education and appropriate resources to help participant work on and attain dietary goals.    Admit Weight  205 lb 14.6 oz (93.4 kg)    Goal Weight: Short Term  200 lb (90.7 kg)    Goal Weight: Long Term  190 lb (86.2 kg)    Expected Outcomes  Short Term: Continue to assess and modify interventions until short term weight is achieved;Long Term: Adherence to nutrition and physical activity/exercise program aimed toward attainment of established weight goal;Weight Loss: Understanding of general recommendations for a balanced deficit meal plan, which promotes 1-2 lb weight loss per week and includes a negative energy balance of [phone] kcal/d;Understanding recommendations for meals to include 15-35% energy as protein, 25-35% energy from fat, 35-60% energy from carbohydrates, less than 273m of dietary cholesterol, 20-35 gm of total fiber daily;Understanding of distribution of calorie intake throughout the day with the  consumption of 4-5 meals/snacks    Diabetes  Yes    Intervention  Provide education about signs/symptoms and action to take for hypo/hyperglycemia.;Provide education about proper nutrition, including hydration, and aerobic/resistive exercise prescription along with prescribed medications to achieve blood glucose in normal ranges: Fasting glucose 65-99 mg/dL    Expected Outcomes  Long Term: Attainment of HbA1C < 7%.;Short Term: Participant verbalizes understanding of the signs/symptoms and immediate care of hyper/hypoglycemia, proper foot care and importance of medication, aerobic/resistive exercise and nutrition plan for blood glucose control.    Hypertension  Yes    Intervention  Provide education on lifestyle modifcations including regular physical activity/exercise, weight management, moderate sodium restriction and increased consumption of fresh fruit, vegetables, and low fat dairy, alcohol moderation, and smoking cessation.;Monitor prescription use compliance.    Expected Outcomes  Short Term: Continued assessment and intervention until BP is < 140/956mHG in hypertensive participants. < 130/8056mG in hypertensive participants with diabetes, heart failure or chronic kidney disease.;Long Term: Maintenance of blood pressure at goal levels.    Lipids  Yes    Intervention  Provide education and support for participant on nutrition & aerobic/resistive exercise along with prescribed medications to achieve LDL <32m41mDL >40mg55m Expected Outcomes  Short Term: Participant states understanding of desired cholesterol values and is compliant with medications prescribed. Participant is following exercise prescription and nutrition guidelines.;Long Term: Cholesterol controlled with medications as prescribed, with individualized exercise RX and with personalized nutrition plan. Value goals: LDL < 32mg,93m > 40 mg.           Core Components/Risk Factors/Patient Goals Review:  Goals and Risk Factor Review     Core Components/Risk Factors/Patient Goals Review    Row Name 11/05/17 1509 12/13/17 0748 12/21/17 1204   Personal Goals Review  Weight Management/Obesity;Diabetes;Hypertension;Lipids  Weight Management/Obesity;Diabetes;Hypertension;Lipids  Weight Management/Obesity;Diabetes;Hypertension;Lipids   Review  pt with multiple CAD RF demonstrates eagerness to participate in CR program. pt personal goal is to resume previous activities including riding his [employer]. pt is following MD recommendation and is not  riding.  pt recent nosebleed hsa further decreased his strength/stamina.  pt eager to regain energy.   pt with multiple CAD RF demonstrates eagerness to participate in CR program. pt personal goal is to resume previous activities including riding his [employer] motorcycle. pt is enjoying outside activities wtih increaed strength and endurance., pt is walking 1.25 miles at home.    pt with multiple CAD RF demonstrates eagerness to participate in CR program. pt personal goal is to resume previous activities including riding his [employer] motorcycle. pt is enjoying outside activities wtih increaed strength and endurance., pt is walking 1.25 miles at home. pt notes his wife is closely monitoring his dietary modifications. pt wife invited to attend nutrition education classes.      Expected Outcomes  pt will participate in CR exercise, nutrition and lifestyle modification opportunities to reduce overall RF.   pt will participate in CR exercise, nutrition and lifestyle modification opportunities to reduce overall RF.   pt will participate in CR exercise, nutrition and lifestyle modification opportunities to reduce overall RF.           Core Components/Risk Factors/Patient Goals at Discharge (Final Review):  Goals and Risk Factor Review - 12/21/17 1204    Core Components/Risk Factors/Patient Goals Review          Personal Goals Review  Weight Management/Obesity;Diabetes;Hypertension;Lipids     Review  pt with multiple CAD RF demonstrates eagerness to participate in CR program. pt personal goal is to resume previous activities including riding his [employer] motorcycle. pt is enjoying outside activities wtih increaed strength and endurance., pt is walking 1.25 miles at home. pt notes his wife is closely monitoring his dietary modifications. pt wife invited to attend nutrition education classes.       Expected Outcomes  pt will participate in CR exercise, nutrition and lifestyle modification opportunities to reduce overall RF.            ITP Comments: ITP Comments    Row Name 10/25/17 1135 11/05/17 1213 11/15/17 1515 12/13/17 0747 01/03/18 0729   ITP Comments  Dr. Fransico Him, Medical Director   pt started group exercise today. pt tolerated light activity without difficulty. pt oriented to exercise equipment and safety routine.   30 day ITP review.    30 day ITP review.  pt with good attendance and participation  30 day ITP review.  pt with good attendance and participation      Comments:

## 2018-01-04 ENCOUNTER — Encounter (HOSPITAL_COMMUNITY)
Admission: RE | Admit: 2018-01-04 | Discharge: 2018-01-04 | Disposition: A | Discharge status: Home / Self Care | Payer: PPO | Source: Ambulatory Visit | Attending: Cardiology | Admitting: Cardiology

## 2018-01-04 ENCOUNTER — Other Ambulatory Visit: Payer: Self-pay

## 2018-01-04 ENCOUNTER — Encounter (HOSPITAL_COMMUNITY): Payer: Self-pay

## 2018-01-04 ENCOUNTER — Ambulatory Visit (HOSPITAL_COMMUNITY): Payer: Self-pay

## 2018-01-04 ENCOUNTER — Emergency Department (HOSPITAL_COMMUNITY): Payer: PPO

## 2018-01-04 ENCOUNTER — Emergency Department (HOSPITAL_COMMUNITY)
Admission: EM | Admit: 2018-01-04 | Discharge: 2018-01-04 | Disposition: A | Discharge status: Home / Self Care | Payer: PPO | Attending: Emergency Medicine | Admitting: Emergency Medicine

## 2018-01-04 DIAGNOSIS — Z951 Presence of aortocoronary bypass graft: Secondary | ICD-10-CM

## 2018-01-04 DIAGNOSIS — I10 Essential (primary) hypertension: Secondary | ICD-10-CM | POA: Diagnosis not present

## 2018-01-04 DIAGNOSIS — Z7982 Long term (current) use of aspirin: Secondary | ICD-10-CM | POA: Diagnosis not present

## 2018-01-04 DIAGNOSIS — E119 Type 2 diabetes mellitus without complications: Secondary | ICD-10-CM | POA: Insufficient documentation

## 2018-01-04 DIAGNOSIS — R002 Palpitations: Secondary | ICD-10-CM | POA: Diagnosis not present

## 2018-01-04 DIAGNOSIS — Z7984 Long term (current) use of oral hypoglycemic drugs: Secondary | ICD-10-CM | POA: Insufficient documentation

## 2018-01-04 DIAGNOSIS — Z79899 Other long term (current) drug therapy: Secondary | ICD-10-CM | POA: Insufficient documentation

## 2018-01-04 LAB — BASIC METABOLIC PANEL
Anion gap: 8 (ref 5–15)
BUN: 21 mg/dL (ref 8–23)
CO2: 25 mmol/L (ref 22–32)
Calcium: 9.7 mg/dL (ref 8.9–10.3)
Chloride: 106 mmol/L (ref 98–111)
Creatinine, Ser: 1.41 mg/dL — ABNORMAL HIGH (ref 0.61–1.24)
GFR calc Af Amer: 58 mL/min — ABNORMAL LOW (ref >60)
GFR calc non Af Amer: 50 mL/min — ABNORMAL LOW (ref >60)
Glucose, Bld: 185 mg/dL — ABNORMAL HIGH (ref 70–99)
Potassium: 4.3 mmol/L (ref 3.5–5.1)
Sodium: 139 mmol/L (ref 135–145)

## 2018-01-04 LAB — I-STAT TROPONIN, ED: Troponin i, poc: 0 ng/mL (ref 0.00–0.08)

## 2018-01-04 LAB — CBC
HCT: 39 % (ref 39.0–52.0)
Hemoglobin: 12.3 g/dL — ABNORMAL LOW (ref 13.0–17.0)
MCH: 27.6 pg (ref 26.0–34.0)
MCHC: 31.5 g/dL (ref 30.0–36.0)
MCV: 87.4 fL (ref 80.0–100.0)
Platelets: 246 10*3/uL (ref 150–400)
RBC: 4.46 MIL/uL (ref 4.22–5.81)
RDW: 13.7 % (ref 11.5–15.5)
WBC: 10.9 10*3/uL — ABNORMAL HIGH (ref 4.0–10.5)
nRBC: 0 % (ref 0.0–0.2)

## 2018-01-04 LAB — MAGNESIUM: Magnesium: 1.7 mg/dL (ref 1.7–2.4)

## 2018-01-04 NOTE — ED Triage Notes (Signed)
Pt states he feels like he went into a-fib about twenty minutes ago while he was walking. Pt states he felt some palpitations and tightness in his throat. Pt denies any shortness of breath or pain.

## 2018-01-04 NOTE — ED Provider Notes (Signed)
[facility] Provider Note   CSN: [id number] Arrival date & time: 01/04/18  1206     History   Chief Complaint Chief Complaint  Patient presents with  . Palpitations    HPI JOURNEE KOHEN is a 67 y.o. male.  The history is provided by the patient.  Palpitations   This is a new problem. The current episode started 1 to 2 hours ago. The problem occurs rarely. The problem has been resolved. The problem is associated with an unknown factor. Associated symptoms include irregular heartbeat. Pertinent negatives include no diaphoresis, no fever, no malaise/fatigue, no numbness, no chest pain, no chest pressure, no claudication, no exertional chest pressure, no near-syncope, no orthopnea, no PND, no syncope, no abdominal pain, no nausea, no vomiting, no headaches, no back pain, no leg pain, no lower extremity edema, no dizziness, no weakness, no cough, no shortness of breath and no sputum production. He has tried nothing for the symptoms. Risk factors include being male (CAD s/p CABG). His past medical history is significant for heart disease.    Past Medical History:  Diagnosis Date  . Bigeminal rhythm 08/01/2017   per EKG 07-24-2017 in epic (per confirmation new since last ecg in 2007)  in care everywhere in epic ecg dated 2012 shows sinus brady rate 53 no pvc's --per pt asymptomatic and   . Bradycardia   . Coronary artery disease involving native coronary artery 08/2017   Abnormal Myoview 08/2017 (Abnormal EKG - Severe ST depressions, PVCs & nsVT). TID noted.  Moderate LAD territory Ischemic Perfusion defect --> Cath with extensive RCA (RPDA & RPAV), Cx & LAD disease --> CABG x 4  . Family history of premature coronary artery disease    Father - MI 35. Paternal uncle x 2 -1 with MI & other with CABG in 12s  . History of contusion    ED visit 08-04-2013 , 9 days past injury ,  dx concussion w/ LOC, per CT inferior frontal lobes contusion hemarrhage--  residual headache (08-01-2017  per pt residual resolved)  . History of kidney stones   . Hypercholesterolemia   . Hypertension   . Nephrolithiasis    bilateral nonobstructive per CT 07-24-2017  . S/P CABG x 4 09/06/2017   LIMA to LAD, SVG to OM2, Sequential SVG to PDA and RPL2, EVH via right thigh and leg  . Type 2 diabetes mellitus ([place])    followed by pcp  . Ureteral calculi    left  . Wears glasses   . Wears hearing aid in both ears     Patient Active Problem List   Diagnosis Date Noted  . New onset atrial fibrillation ([place]) 10/22/2017  . Ischemic cardiomyopathy - pre-CABG ECHO: 40-45% 09/14/2017  . S/P CABG x 4 09/06/2017  . Coronary artery disease involving native coronary artery of native heart without angina pectoris 09/03/2017  . Ventricular bigeminy 08/29/2017  . Essential hypertension 08/29/2017  . Hyperlipidemia associated with type 2 diabetes mellitus ([place]) 08/29/2017  . Family history of premature coronary artery disease 08/29/2017  . Frequent unifocal PVCs 08/29/2017    Past Surgical History:  Procedure Laterality Date  . CORONARY ARTERY BYPASS GRAFT N/A 09/06/2017   Procedure: CORONARY ARTERY BYPASS GRAFTING (CABG) x 4, using left internal mammary artery and right leg greater saphenous vein harvested endoscopically;  Surgeon: Rexene Alberts, MD;  Location: [facility];  Service: Open Heart Surgery;; LIMA-LAD, SVG-OM2, SeqSVG-RPDA-RPL2  . CYSTOSCOPY/RETROGRADE/URETEROSCOPY Left 08/06/2017   Procedure: CYSTOSCOPY/RETROGRADE/URETEROSCOPY  laser and stone basket extrection;  Surgeon: Cleon Gustin, MD;  Location: [facility];  Service: Urology;  Laterality: Left;  . LAPAROSCOPIC APPENDECTOMY  11-18-2005    dr wyatt  [facility]  . LEFT HEART CATH AND CORONARY ANGIOGRAPHY N/A 09/03/2017   Procedure: LEFT HEART CATH AND CORONARY ANGIOGRAPHY;  Surgeon: Leonie Man, MD;  Location: [facility];  Service: Cardiovascular;; mRCA 70%, dRCA 99%-95% into RPAV &  90% ostRPDA 95%. RPL1 70%, distal RVAM (tandem PDA) - 85%. Ost-prox LAD 40% -pLAD 60&90% @ D1 ->p-mLAD 95% @ D2 with 70% D2. pCx 80%, p-mCx 90%-> mCx 85%.  EF 45-50%, global HK. -->> referred for CABG  . NASAL ENDOSCOPY WITH EPISTAXIS CONTROL N/A 11/29/2017   Procedure: NASAL ENDOSCOPY WITH EPISTAXIS CONTROL;  Surgeon: Leta Baptist, MD;  Location: [facility];  Service: ENT;  Laterality: N/A;  . NM MYOVIEW LTD  08/31/2017   HIGH RISK: Combination Imaging & EKG:  with  in tmedium sized, moderate severity Reversible perfusion e apdefect in  the mid and apical anterior, apical lateral walls - FINDINGS ARE C/W ISCHEMIA in the LAD territory,  however given significant arrhythmias, severe ST depressions and TID =  MV CAD is highly likely (SDS =7).   . ORCHIECTOMY  1965   unilateral -- per pt ruptured  . TEE WITHOUT CARDIOVERSION N/A 09/06/2017   Procedure: TRANSESOPHAGEAL ECHOCARDIOGRAM (TEE);  INTRA-OP.  Surgeon: Rexene Alberts, MD;  Location: [facility] OR;  Service: Open Heart Surgery:;; Normal AoV, MV & TV.  Normal RV.  Marland Kitchen TRANSTHORACIC ECHOCARDIOGRAM  09/04/2017   (pre-CABG): EF 40-45%, diffuse HK.  Difficult to assess due to frequent PVCs. Severe LA dilation. Mildly increased PAP.  Marland Kitchen UMBILICAL HERNIA REPAIR  2006  . VARICOSE VEIN SURGERY  2007   left endovascular laser ablation        Home Medications    Prior to Admission medications   Medication Sig Start Date End Date Taking? Authorizing Provider  aspirin EC 81 MG tablet Take 1 tablet (81 mg total) by mouth daily. 10/25/17   Leonie Man, MD  atorvastatin (LIPITOR) 80 MG tablet Take 1 tablet (80 mg total) by mouth daily at 6 PM. 11/05/17   Leonie Man, MD  lisinopril (PRINIVIL,ZESTRIL) 10 MG tablet TAKE 1 TABLET BY MOUTH EVERY DAY 12/07/17   Leonie Man, MD  metoprolol succinate (TOPROL XL) 25 MG 24 hr tablet Take 1 tablet (25 mg total) by mouth daily. 10/10/17   Almyra Deforest, PA  metoprolol tartrate (LOPRESSOR) 25 MG tablet  Take 1 tablet (25 mg total) by mouth as needed. TAKE FOR PROLONG INCREASE HEART RATE OR PALPATIONS 10/22/17 01/20/18  Leonie Man, MD  Multiple Vitamin (MULTIVITAMIN) tablet Take 1 tablet by mouth daily.    [provider]  oxyCODONE-acetaminophen (PERCOCET) 5-325 MG tablet Take 1 tablet by mouth every 4 (four) hours as needed for severe pain. 11/29/17   Leta Baptist, MD  sitaGLIPtan-metformin (JANUMET) 50-500 MG per tablet Take 1 tablet by mouth 2 (two) times daily with a meal.     [provider]    Family History Family History  Problem Relation Age of Onset  . Stroke Mother   . Atrial fibrillation Mother   . Clotting disorder Father   . Heart attack Father 43       Died from complications  . Diabetes Daughter   . Heart disease Other   . Stroke Other   . Heart  attack Paternal Uncle        In his 56s  . CAD Paternal Uncle        Has had either bypass surgery or stents    Social History Social History   Tobacco Use  . Smoking status: Never Smoker  . Smokeless tobacco: Never Used  Substance Use Topics  . Alcohol use: No  . Drug use: No     Allergies   Patient has no known allergies.   Review of Systems Review of Systems  Constitutional: Negative for chills, diaphoresis, fever and malaise/fatigue.  HENT: Negative for ear pain and sore throat.   Eyes: Negative for pain and visual disturbance.  Respiratory: Negative for cough, sputum production and shortness of breath.   Cardiovascular: Positive for palpitations. Negative for chest pain, orthopnea, claudication, syncope, PND and near-syncope.  Gastrointestinal: Negative for abdominal pain, nausea and vomiting.  Genitourinary: Negative for dysuria and hematuria.  Musculoskeletal: Negative for arthralgias and back pain.  Skin: Negative for color change and rash.  Neurological: Negative for dizziness, seizures, syncope, weakness, numbness and headaches.  All other systems reviewed and are  negative.    Physical Exam Updated Vital Signs  ED Triage Vitals  Enc Vitals Group     BP 01/04/18 1213 (!) 155/82     Pulse Rate 01/04/18 1213 85     Resp 01/04/18 1213 17     Temp 01/04/18 1213 97.6 F (36.4 C)     Temp Source 01/04/18 1213 Oral     SpO2 01/04/18 1213 96 %     Weight 01/04/18 1220 214 lb (97.1 kg)     Height 01/04/18 1220 5\' 11"  (1.803 m)     Head Circumference --      Peak Flow --      Pain Score 01/04/18 1213 0     Pain Loc --      Pain Edu? --      Excl. in [place]? --     Physical Exam  Constitutional: He is oriented to person, place, and time. He appears well-developed and well-nourished.  HENT:  Head: Normocephalic and atraumatic.  Eyes: Pupils are equal, round, and reactive to light. Conjunctivae and EOM are normal.  Neck: Normal range of motion. Neck supple.  Cardiovascular: Normal rate, regular rhythm, normal heart sounds and intact distal pulses.  No murmur heard. Pulmonary/Chest: Effort normal and breath sounds normal. No respiratory distress.  Abdominal: Soft. Bowel sounds are normal. He exhibits no distension. There is no tenderness.  Musculoskeletal: Normal range of motion. He exhibits no edema.  Neurological: He is alert and oriented to person, place, and time.  Skin: Skin is warm and dry. Capillary refill takes less than 2 seconds.  Psychiatric: He has a normal mood and affect.  Nursing note and vitals reviewed.    ED Treatments / Results  Labs (all labs ordered are listed, but only abnormal results are displayed) Labs Reviewed  BASIC METABOLIC PANEL - Abnormal; Notable for the following components:      Result Value   Glucose, Bld 185 (*)    Creatinine, Ser 1.41 (*)    GFR calc non Af Amer 50 (*)    GFR calc Af Amer 58 (*)    All other components within normal limits  CBC - Abnormal; Notable for the following components:   WBC 10.9 (*)    Hemoglobin 12.3 (*)    All other components within normal limits  MAGNESIUM  I-STAT  TROPONIN, ED  EKG EKG Interpretation  Date/Time:  Friday January 04 2018 12:12:38 EST Ventricular Rate:  84 PR Interval:    QRS Duration: 90 QT Interval:  378 QTC Calculation: 447 R Axis:   1 Text Interpretation:  Sinus rhythm Borderline repolarization abnormality Baseline wander in lead(s) V5 Confirmed by Lennice Sites [phone]) on 01/04/2018 12:43:52 PM   Radiology Dg Chest Port 1 View  Result Date: 01/04/2018 CLINICAL DATA:  Palpitations. EXAM: PORTABLE CHEST 1 VIEW COMPARISON:  Radiographs of October 08, 2017. FINDINGS: Stable cardiomediastinal silhouette. Status post coronary bypass graft. No pneumothorax or pleural effusion is noted. Both lungs are clear. The visualized skeletal structures are unremarkable. IMPRESSION: No acute cardiopulmonary abnormality seen. Electronically Signed   By: Marijo Conception, M.D.   On: 01/04/2018 12:41    Procedures Procedures (including critical care time)  Medications Ordered in ED Medications - No data to display   Initial Impression / Assessment and Plan / ED Course  I have reviewed the triage vital signs and the nursing notes.  Pertinent labs & imaging results that were available during my care of the patient were reviewed by me and considered in my medical decision making (see chart for details).     TEREK BEE is a 67 year old male with history of hypertension, high cholesterol, palpitations, coronary artery disease status post CABG who presents to the ED with palpitations.  Patient with normal vitals.  No fever.  EKG shows sinus rhythm.  On the monitor there are some PVCs but otherwise no arrhythmia found throughout ED stay.  Patient felt palpitations for several seconds earlier today.  Denies any dizziness, lightheadedness, syncope during that event.  He just felt some tightness in his throat, anxiety feeling during the event.  He is asymptomatic upon my arrival.  Patient with no chest pain, shortness of breath, abdominal pain.   Patient overall well-appearing.  No signs of volume overload on exam.  No significant electrolyte abnormality, kidney injury, leukocytosis.  Chest x-ray without any signs of pneumonia, pneumothorax, pleural effusion.  Patient with unremarkable troponin.  Magnesium within normal limits.  Patient remained asymptomatic throughout my care.  Recommend follow-up with cardiology which patient has in placed for next week.  Doubt cardiac process at this time.  Suspect symptomatic PVCs.  May benefit from wearing heart monitor again.  Given return precautions and discharged from ED in good condition.  This chart was dictated using voice recognition software.  Despite best efforts to proofread,  errors can occur which can change the documentation meaning.   Final Clinical Impressions(s) / ED Diagnoses   Final diagnoses:  Palpitations    ED Discharge Orders    None       Lennice Sites, DO 01/04/18 1539

## 2018-01-07 ENCOUNTER — Encounter (HOSPITAL_COMMUNITY): Payer: PPO

## 2018-01-07 ENCOUNTER — Telehealth: Payer: Self-pay | Admitting: Cardiology

## 2018-01-07 ENCOUNTER — Ambulatory Visit (HOSPITAL_COMMUNITY): Payer: Self-pay

## 2018-01-07 NOTE — Telephone Encounter (Signed)
New message    Per Gwyn need to know if he is okay to have epenepharine for numbing? Per Qwyn would like answer in writing. Please fax to [phone]. Need ASAP.

## 2018-01-07 NOTE — Telephone Encounter (Signed)
   Call back staff: Marland Kitchen Clearance letter has been faxed to the requesting surgeon. . Please contact the surgeon's office to ensure it has been received. . This phone note will be removed from the preop pool. . Please sign encounter when completed.  Richardson Dopp, PA-C    01/07/2018 2:48 PM

## 2018-01-07 NOTE — Telephone Encounter (Signed)
Spoke with [facility] and they have received the clearance letter.

## 2018-01-09 ENCOUNTER — Encounter: Payer: Self-pay | Admitting: Cardiology

## 2018-01-09 ENCOUNTER — Ambulatory Visit (INDEPENDENT_AMBULATORY_CARE_PROVIDER_SITE_OTHER): Payer: PPO | Admitting: Cardiology

## 2018-01-09 ENCOUNTER — Ambulatory Visit (HOSPITAL_COMMUNITY): Payer: Self-pay

## 2018-01-09 ENCOUNTER — Encounter (HOSPITAL_COMMUNITY): Payer: PPO

## 2018-01-09 VITALS — BP 140/80 | HR 66 | Ht 71.0 in | Wt 220.0 lb

## 2018-01-09 DIAGNOSIS — I471 Supraventricular tachycardia: Secondary | ICD-10-CM | POA: Insufficient documentation

## 2018-01-09 DIAGNOSIS — I493 Ventricular premature depolarization: Secondary | ICD-10-CM | POA: Diagnosis not present

## 2018-01-09 DIAGNOSIS — I255 Ischemic cardiomyopathy: Secondary | ICD-10-CM | POA: Diagnosis not present

## 2018-01-09 DIAGNOSIS — I499 Cardiac arrhythmia, unspecified: Secondary | ICD-10-CM

## 2018-01-09 DIAGNOSIS — I498 Other specified cardiac arrhythmias: Secondary | ICD-10-CM

## 2018-01-09 DIAGNOSIS — I10 Essential (primary) hypertension: Secondary | ICD-10-CM

## 2018-01-09 DIAGNOSIS — I2510 Atherosclerotic heart disease of native coronary artery without angina pectoris: Secondary | ICD-10-CM

## 2018-01-09 DIAGNOSIS — I480 Paroxysmal atrial fibrillation: Secondary | ICD-10-CM | POA: Diagnosis not present

## 2018-01-09 DIAGNOSIS — E785 Hyperlipidemia, unspecified: Secondary | ICD-10-CM | POA: Diagnosis not present

## 2018-01-09 DIAGNOSIS — E1169 Type 2 diabetes mellitus with other specified complication: Secondary | ICD-10-CM

## 2018-01-09 MED ORDER — METOPROLOL SUCCINATE ER 25 MG PO TB24: 25.0000 mg | ORAL_TABLET | Freq: Two times a day (BID) | ORAL | 3 refills | Status: DC

## 2018-01-09 MED ORDER — METOPROLOL TARTRATE 25 MG PO TABS: 25.0000 mg | ORAL_TABLET | ORAL | 6 refills | Status: DC | PRN

## 2018-01-09 NOTE — Assessment & Plan Note (Signed)
He should be due for having labs checked soon.  He remains on 80 mg atorvastatin.  He thinks he just had some blood work done, will need to look into this.  Would like to get the LDL all down below 50.  If he has not had labs drawn by December, we will go ahead and order lipid panel.

## 2018-01-09 NOTE — Assessment & Plan Note (Signed)
Not 100% clear if this is truly A. fib or not on the monitor.  I am referring to EP for assistance and interpretation and management.  He had significant bleeding issues with nosebleeds on DOAC.  Not currently on DOAC, status post ENT procedure.  At this point will hold off on antiarrhythmic and just simply increase beta-blocker dosing.  Question if amiodarone would be appropriate, but defer to EP.

## 2018-01-09 NOTE — Patient Instructions (Signed)
Medication Instructions:  TOPROL XL 25 MG  -- TAKE 2 TABLET  TWICE A DAY  MAY USE  METOPROLOL TARTRATE  AS NEEDED    If you need a refill on your cardiac medications before your next appointment, please call your pharmacy.   Lab work: NOT NEEDED If you have labs (blood work) drawn today and your tests are completely normal, you will receive your results only by: Marland Kitchen MyChart Message (if you have MyChart) OR . A paper copy in the mail If you have any lab test that is abnormal or we need to change your treatment, we will call you to review the results.  Testing/Procedures: NOT NEEDED  Follow-Up: At [facility], you and your health needs are our priority.  As part of our continuing mission to provide you with exceptional heart care, we have created designated Provider Care Teams.  These Care Teams include your primary Cardiologist (physician) and Advanced Practice Providers (APPs -  Physician Assistants and Nurse Practitioners) who all work together to provide you with the care you need, when you need it. You will need a follow up appointment in 4 months- MARCH 2020.  Please call our office 2 months in advance to schedule this appointment.  You may see [name], MD or one of the following Advanced Practice Providers on your designated Care Team:   Rosaria Ferries, PA-C . Jory Sims, DNP, ANP  Any Other Special Instructions Will Be Listed Below (If Applicable).  You have been referred to  ELECTROPHYSIOLOGIST AT  [address]

## 2018-01-09 NOTE — Assessment & Plan Note (Signed)
Unfortunately, his EF did not improve initially on post-CABG echo.  EF still in the 40 to 45% range.  He seems relatively euvolemic on exam, has not required diuretic.

## 2018-01-09 NOTE — Assessment & Plan Note (Signed)
Maybe only had one episode of angina leading to his stress test and cardiac catheterization.  Has not had any further angina with exception of some chest discomfort with rapid palpitations this past weekend.  Otherwise seems to be recovering quite well from CABG.  Gradually getting stronger and more exercise tolerance than last year.  Plan: Continue aspirin, statin, beta-blocker and ACE inhibitor.

## 2018-01-09 NOTE — Assessment & Plan Note (Signed)
Still has frequent PVCs.  Significant PVCs noted on monitor despite having CABG.  With intermittent bursts of SVT and possibly A. fib, and a continue to titrate up beta-blocker.  We will increase the Toprol 25 mg twice daily --> refer to EP to consider possible antiarrhythmic agent.

## 2018-01-09 NOTE — Assessment & Plan Note (Signed)
Not really sure but it sounds like he had some pretty long episodes of what was either A. fib or SVT.  Based on the heart rates, not likely a flutter.  The presence of lots of PVCs with may be think it may be more likely SVT, however there was one computer suggested strip of A. fib amongst everything else showing SVT on the monitor. I talked to him about vagal maneuvers. Increase beta-blocker to Toprol 25 mg twice daily with as needed doses of Lopressor. Refer to EP to consider antiarrhythmic agent.

## 2018-01-09 NOTE — Assessment & Plan Note (Signed)
Still present on monitor.  Increased dose of Toprol to 25 twice daily with 25 mg as needed Lopressor.

## 2018-01-09 NOTE — Progress Notes (Signed)
PCP: Haywood Pao, MD  Clinic Note: Chief Complaint  [name] presents with  . Hospitalization Follow-up    ER visit for rapid palpitations  . Coronary Artery Disease  . Tachycardia    Intermittent episodes of PSVT on monitor    HPI: Taylor [name] is a 67 y.o. male with a PMH notable for MV CAD - CABG & ? PAF/PSVT who presents today for 3 month & ER f/u.  -- Initially seen for Ventricular Bigeminy / Bradycardia, strong FH of CAD (F - MI & died @ 75, P Uncle x 2 with CAD - one died, M - PAF). Taylor [name] did note a significant episode of DOE & chest tightness prior to Taylor initial visit. --> High Risk Myoview w/ Ant ischemia --> Cath w/ MV CAD --> CABG. -- Event monitor post CABG with ? Afib --> initially started on DOAC, now held due to significant epistaxis leading to ENT procedure.  = > Monitor had several PSVT runs, but one called Afib.  Taylor [name] was last seen on 10/22/2017 - Taylor [name] did note an episode of tachycardia & felt bad (associated with Monitor finding). NO angina or DOE. Walks ~ 1/2 mile /day & just completed [facility].  Recent Hospitalizations:   11/22 - ER visit for palpitations  Studies Personally Reviewed - (if available, images/films reviewed: From Epic Chart or Care Everywhere)  Echo 01/01/2018: EF 40-45% with diffuse HK, Gr 2 DD. Severe LA dilation. (stable)  Event Monitor 09/2017: Mostly sinus rhythm with frequent PVCs (12% burden).  Rare PACs.  Multiple short beats runs of PVCs 4-5 beats.  Several short runs of SVT with 3-4 prolonged runs.  Appears to be more consistent with SVT than A. fib.  Interval History: Taylor [name] returns here today actually having noted at least 2 episodes in Taylor last week of rapid palpitations.  Taylor first was on number 22nd Taylor [name] went to Taylor emergency room.  Taylor [name] was at work, and felt his heart rate go up fast Taylor [name] felt tightness in his chest noted his blood pressure to be 170/110 with a heart rate of 160 beats a minute.  Taylor [name] felt a little bit short of  breath with that, and decided to go to Taylor emergency room.  Taylor [name] actually went to Taylor emergency room and upon arrival there was no longer going fast and was feeling better.  When Taylor [name] is heart rate slowed Taylor [name] felt much better with no more chest pain or dyspnea.  Taylor [name] did not feel like Taylor [name] had any syncope or near syncope type symptoms.  2 days later while shopping at [employer] with his wife, Taylor [name] again noted that his blood pressure went up in his heart rate went up.  While Taylor episode on Friday lasted maybe 20 to 30 minutes, Taylor [name] is not really sure how long Taylor episode on Sunday lasted.  Taylor [name] did not have as much of Taylor chest discomfort dyspnea or dizziness that Taylor [name] had on Sunday.  Taylor [name] recorded his blood pressures initially and then again a few hours later, so Taylor [name] may have been in Taylor fast heart rate for at least an hour or so if not more.  Heart rates were in Taylor 160s at roughly 1 PM and then again at 5 PM.  Otherwise when Taylor [name] is not having these fast heart rate spells Taylor [name] denies any resting or exertional chest tightness pressure.  Taylor [name] is doing cardiac rehab doing quite well Taylor [name] actually says Taylor [name] is starting to note that Taylor [name]  probably had been backing off on activity over Taylor last year or so, stating that Taylor [name] is now able to do things that Taylor [name] was not doing a year ago because of dyspnea or fatigue.  Taylor [name] says in addition to been doing cardiac rehab Taylor [name] still walks about a mile to mile and a half a day at a brisk pace.  In general says Taylor [name] is getting gradually stronger as Taylor days go by recovering from surgery.  Taylor [name] has had his nasal procedure for recurrent epistaxis.  We eventually stopped his Eliquis/Xarelto which have been started for possible A. fib.  I told Taylor [name] not to restart it because I was not sure that his monitor actually showed A. fib as opposed to just SVT. Despite having Taylor tachycardia episodes Taylor [name] is not having any syncope or near syncope.  Taylor [name] is not having any PND orthopnea but does have some mild end of day edema.  Usually that  goes down on its own.  No TIA/amaurosis fugax symptoms. No melena, hematochezia, hematuria, or epstaxis. No claudication.  ROS: A comprehensive was performed.  Pertinent symptoms as noted above. Review of Systems  Constitutional: Negative for malaise/fatigue.  HENT: Negative for congestion and nosebleeds (None since ENT procedure).   Respiratory: Negative for cough.   Gastrointestinal: Negative for abdominal pain, heartburn, nausea and vomiting.  Musculoskeletal: Negative for back pain, falls and joint pain.  Neurological: Positive for headaches. Negative for dizziness and weakness.  Psychiatric/Behavioral: Negative.   All other systems reviewed and are negative.   I have reviewed and (if needed) personally updated Taylor [name]'s problem list, medications, allergies, past medical and surgical history, social and family history.   Past Medical History:  Diagnosis Date  . Bigeminal rhythm 08/01/2017   per EKG 07-24-2017 in epic (per confirmation new since last ecg in 2007)  in care everywhere in epic ecg dated 2012 shows sinus brady rate 53 no pvc's --per pt asymptomatic and   . Bradycardia   . Coronary artery disease involving native coronary artery 08/2017   Abnormal Myoview 08/2017 (Abnormal EKG - Severe ST depressions, PVCs & nsVT). TID noted.  Moderate LAD territory Ischemic Perfusion defect --> Cath with extensive RCA (RPDA & RPAV), Cx & LAD disease --> CABG x 4  . Family history of premature coronary artery disease    Father - MI 42. Paternal uncle x 2 -1 with MI & other with CABG in 76s  . History of contusion    ED visit 08-04-2013 , 9 days past injury ,  dx concussion w/ LOC, per CT inferior frontal lobes contusion hemarrhage-- residual headache (08-01-2017  per pt residual resolved)  . History of kidney stones   . Hypercholesterolemia   . Hypertension   . Nephrolithiasis    bilateral nonobstructive per CT 07-24-2017  . S/P CABG x 4 09/06/2017   LIMA to LAD, SVG to OM2,  Sequential SVG to PDA and RPL2, EVH via right thigh and leg  . Type 2 diabetes mellitus ([place])    followed by pcp  . Ureteral calculi    left  . Wears glasses   . Wears hearing aid in both ears     Past Surgical History:  Procedure Laterality Date  . CORONARY ARTERY BYPASS GRAFT N/A 09/06/2017   Procedure: CORONARY ARTERY BYPASS GRAFTING (CABG) x 4, using left internal mammary artery and right leg greater saphenous vein harvested endoscopically;  Surgeon: Rexene Alberts, MD;  Location: [facility];  Service: Open Heart Surgery;; LIMA-LAD,  SVG-OM2, SeqSVG-RPDA-RPL2  . CYSTOSCOPY/RETROGRADE/URETEROSCOPY Left 08/06/2017   Procedure: CYSTOSCOPY/RETROGRADE/URETEROSCOPY laser and stone basket extrection;  Surgeon: Cleon Gustin, MD;  Location: [facility];  Service: Urology;  Laterality: Left;  . LAPAROSCOPIC APPENDECTOMY  11-18-2005    dr wyatt  [facility]  . LEFT HEART CATH AND CORONARY ANGIOGRAPHY N/A 09/03/2017   Procedure: LEFT HEART CATH AND CORONARY ANGIOGRAPHY;  Surgeon: Leonie Man, MD;  Location: [facility];  Service: Cardiovascular;; mRCA 70%, dRCA 99%-95% into RPAV & 90% ostRPDA 95%. RPL1 70%, distal RVAM (tandem PDA) - 85%. Ost-prox LAD 40% -pLAD 60&90% @ D1 ->p-mLAD 95% @ D2 with 70% D2. pCx 80%, p-mCx 90%-> mCx 85%.  EF 45-50%, global HK. -->> referred for CABG  . NASAL ENDOSCOPY WITH EPISTAXIS CONTROL N/A 11/29/2017   Procedure: NASAL ENDOSCOPY WITH EPISTAXIS CONTROL;  Surgeon: Leta Baptist, MD;  Location: [facility];  Service: ENT;  Laterality: N/A;  . NM MYOVIEW LTD  08/31/2017   HIGH RISK: Combination Imaging & EKG:  with  in tmedium sized, moderate severity Reversible perfusion e apdefect in  Taylor mid and apical anterior, apical lateral walls - FINDINGS ARE C/W ISCHEMIA in Taylor LAD territory,  however given significant arrhythmias, severe ST depressions and TID =  MV CAD is highly likely (SDS =7).   . ORCHIECTOMY  1965   unilateral -- per pt ruptured    . TEE WITHOUT CARDIOVERSION N/A 09/06/2017   Procedure: TRANSESOPHAGEAL ECHOCARDIOGRAM (TEE);  INTRA-OP.  Surgeon: Rexene Alberts, MD;  Location: [facility] OR;  Service: Open Heart Surgery:;; Normal AoV, MV & TV.  Normal RV.  Marland Kitchen TRANSTHORACIC ECHOCARDIOGRAM  09/04/2017   (pre-CABG): EF 40-45%, diffuse HK.  Difficult to assess due to frequent PVCs. Severe LA dilation. Mildly increased PAP.  Marland Kitchen UMBILICAL HERNIA REPAIR  2006  . VARICOSE VEIN SURGERY  2007   left endovascular laser ablation    Current Meds  Medication Sig  . aspirin EC 81 MG tablet Take 1 tablet (81 mg total) by mouth daily.  Marland Kitchen atorvastatin (LIPITOR) 80 MG tablet Take 1 tablet (80 mg total) by mouth daily at 6 PM.  . lisinopril (PRINIVIL,ZESTRIL) 10 MG tablet TAKE 1 TABLET BY MOUTH EVERY DAY  . metoprolol tartrate (LOPRESSOR) 25 MG tablet Take 1 tablet (25 mg total) by mouth as needed. TAKE FOR PROLONG INCREASE HEART RATE OR PALPATIONS  . Multiple Vitamin (MULTIVITAMIN) tablet Take 1 tablet by mouth daily.  . sitaGLIPtan-metformin (JANUMET) 50-500 MG per tablet Take 1 tablet by mouth 2 (two) times daily with a meal.   . [DISCONTINUED] metoprolol succinate (TOPROL XL) 25 MG 24 hr tablet Take 1 tablet (25 mg total) by mouth daily.  . [DISCONTINUED] metoprolol tartrate (LOPRESSOR) 25 MG tablet Take 1 tablet (25 mg total) by mouth as needed. TAKE FOR PROLONG INCREASE HEART RATE OR PALPATIONS    No Known Allergies  Social History   Tobacco Use  . Smoking status: Never Smoker  . Smokeless tobacco: Never Used  Substance Use Topics  . Alcohol use: No  . Drug use: No   Social History   Social History Narrative   married and lives locally in [place] with his family.     Taylor [name] has 2 adult children and several grandchildren.     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     family history includes Atrial fibrillation in his mother; CAD in his paternal uncle; Clotting disorder  in  his father; Diabetes in his daughter; Heart attack in his paternal uncle; Heart attack (age of onset: 26) in his father; Heart disease in his other; Stroke in his mother and other.  Wt Readings from Last 3 Encounters:  01/09/18 220 lb (99.8 kg)  01/04/18 214 lb (97.1 kg)  11/29/17 212 lb 11.9 oz (96.5 kg)    PHYSICAL EXAM BP 140/80   Pulse 66   Ht 5\' 11"  (1.803 m)   Wt 220 lb (99.8 kg)   BMI 30.68 kg/m  Physical Exam  Constitutional: Taylor [name] is oriented to person, place, and time. Taylor [name] appears well-developed and well-nourished. No distress.  Healthy-appearing  HENT:  Head: Normocephalic and atraumatic.  Neck: Normal range of motion. Neck supple. No hepatojugular reflux and no JVD present. Carotid bruit is not present.  Cardiovascular: Normal rate, regular rhythm, normal heart sounds, intact distal pulses and normal pulses. Frequent extrasystoles are present. PMI is not displaced. Exam reveals no gallop and no friction rub.  No murmur heard. Pulmonary/Chest: Effort normal and breath sounds normal. No respiratory distress. Taylor [name] has no wheezes. Taylor [name] has no rales.  Abdominal: Soft. Bowel sounds are normal. Taylor [name] exhibits no distension. There is no tenderness. There is no rebound.  No HSM  Musculoskeletal: Normal range of motion. Taylor [name] exhibits no edema.  Neurological: Taylor [name] is alert and oriented to person, place, and time.  Psychiatric: Taylor [name] has a normal mood and affect. His behavior is normal. Judgment and thought content normal.  Vitals reviewed.    Adult ECG Report  Rate: 66;  Rhythm: premature ventricular contractions (PVC); LAA, LVH with repolarization changes   Narrative Interpretation: stable    Other studies Reviewed: Additional studies/ records that were reviewed today include:  Recent Labs:  06/2017 (from [facility]) - TC 127, TG 64, LDL 82, HDL 32  No results found for: CHOL, HDL, LDLCALC, LDLDIRECT, TRIG, CHOLHDL  ASSESSMENT / PLAN: Problem List Items Addressed This Visit    Coronary artery  disease involving native coronary artery of native heart without angina pectoris (Chronic)    Maybe only had one episode of angina leading to his stress test and cardiac catheterization.  Has not had any further angina with exception of some chest discomfort with rapid palpitations this past weekend.  Otherwise seems to be recovering quite well from CABG.  Gradually getting stronger and more exercise tolerance than last year.  Plan: Continue aspirin, statin, beta-blocker and ACE inhibitor.      Relevant Medications   metoprolol tartrate (LOPRESSOR) 25 MG tablet   metoprolol succinate (TOPROL XL) 25 MG 24 hr tablet   Essential hypertension (Chronic)   Relevant Medications   metoprolol tartrate (LOPRESSOR) 25 MG tablet   metoprolol succinate (TOPROL XL) 25 MG 24 hr tablet   Frequent unifocal PVCs (Chronic)    Still present on monitor.  Increased dose of Toprol to 25 twice daily with 25 mg as needed Lopressor.      Relevant Medications   metoprolol tartrate (LOPRESSOR) 25 MG tablet   metoprolol succinate (TOPROL XL) 25 MG 24 hr tablet   Other Relevant Orders   EKG 12-Lead   Ambulatory referral to Cardiac Electrophysiology   Hyperlipidemia associated with type 2 diabetes mellitus ([place]) (Chronic)    Taylor [name] should be due for having labs checked soon.  Taylor [name] remains on 80 mg atorvastatin.  Taylor [name] thinks Taylor [name] just had some blood work done, will need to look into this.  Would like to get Taylor LDL all down below 50.  If Taylor [name] has not had labs drawn by December, we will go ahead and order lipid panel.      Ischemic cardiomyopathy - pre-CABG ECHO: 40-45% (Chronic)    Unfortunately, his EF did not improve initially on post-CABG echo.  EF still in Taylor 40 to 45% range.  Taylor [name] seems relatively euvolemic on exam, has not required diuretic.      Relevant Medications   metoprolol tartrate (LOPRESSOR) 25 MG tablet   metoprolol succinate (TOPROL XL) 25 MG 24 hr tablet   Other Relevant Orders   EKG 12-Lead   Ambulatory  referral to Cardiac Electrophysiology   PAF (paroxysmal atrial fibrillation) (HCC) (Chronic)    Not 100% clear if this is truly A. fib or not on Taylor monitor.  I am referring to EP for assistance and interpretation and management.  Taylor [name] had significant bleeding issues with nosebleeds on DOAC.  Not currently on DOAC, status post ENT procedure.  At this point will hold off on antiarrhythmic and just simply increase beta-blocker dosing.  Question if amiodarone would be appropriate, but defer to EP.      Relevant Medications   metoprolol tartrate (LOPRESSOR) 25 MG tablet   metoprolol succinate (TOPROL XL) 25 MG 24 hr tablet   PSVT (paroxysmal supraventricular tachycardia) (HCC) - Primary (Chronic)    Not really sure but it sounds like Taylor [name] had some pretty long episodes of what was either A. fib or SVT.  Based on Taylor heart rates, not likely a flutter.  Taylor presence of lots of PVCs with may be think it may be more likely SVT, however there was one computer suggested strip of A. fib amongst everything else showing SVT on Taylor monitor. I talked to Taylor [name] about vagal maneuvers. Increase beta-blocker to Toprol 25 mg twice daily with as needed doses of Lopressor. Refer to EP to consider antiarrhythmic agent.      Relevant Medications   metoprolol tartrate (LOPRESSOR) 25 MG tablet   metoprolol succinate (TOPROL XL) 25 MG 24 hr tablet   Other Relevant Orders   EKG 12-Lead   Ambulatory referral to Cardiac Electrophysiology   Ventricular bigeminy (Chronic)    Still has frequent PVCs.  Significant PVCs noted on monitor despite having CABG.  With intermittent bursts of SVT and possibly A. fib, and a continue to titrate up beta-blocker.  We will increase Taylor Toprol 25 mg twice daily --> refer to EP to consider possible antiarrhythmic agent.      Relevant Medications   metoprolol tartrate (LOPRESSOR) 25 MG tablet   metoprolol succinate (TOPROL XL) 25 MG 24 hr tablet   Other Relevant Orders   EKG 12-Lead    Ambulatory referral to Cardiac Electrophysiology       I spent a total of 25 minutes with Taylor [name] and chart review. >  50% of Taylor time was spent in direct [name] consultation.   Current medicines are reviewed at length with Taylor [name] today.  (+/- concerns)  NOT sure what to do re rapid HR episodes Taylor following changes have been made:  See below   [name] Instructions  Medication Instructions:  TOPROL XL 25 MG  -- TAKE 2 TABLET  TWICE A DAY  MAY USE  METOPROLOL TARTRATE  AS NEEDED    If you need a refill on your cardiac medications before your next appointment, please call your pharmacy.   Lab work: NOT NEEDED If you have labs (blood work) drawn today and your tests are completely normal, you will receive  your results only by: Marland Kitchen MyChart Message (if you have MyChart) OR . A paper copy in Taylor mail If you have any lab test that is abnormal or we need to change your treatment, we will call you to review Taylor results.  Testing/Procedures: NOT NEEDED  Follow-Up: At [facility], you and your health needs are our priority.  As part of our continuing mission to provide you with exceptional heart care, we have created designated Provider Care Teams.  These Care Teams include your primary Cardiologist (physician) and Advanced Practice Providers (APPs -  Physician Assistants and Nurse Practitioners) who all work together to provide you with Taylor care you need, when you need it. You will need a follow up appointment in 4 months- MARCH 2020.  Please call our office 2 months in advance to schedule this appointment.  You may see [name], MD or one of Taylor following Advanced Practice Providers on your designated Care Team:   Rosaria Ferries, PA-C . Jory Sims, DNP, ANP  Any Other Special Instructions Will Be Listed Below (If Applicable).  You have been referred to  ELECTROPHYSIOLOGIST AT  [facility]     Studies Ordered:   Orders Placed This  Encounter  Procedures  . Ambulatory referral to Cardiac Electrophysiology  . EKG 12-Lead      [name], M.D., M.S. Interventional Cardiologist   Pager # [phone] Phone # [phone] [address] [place], [place] [address]   Thank you for choosing Heartcare at [facility]!!

## 2018-01-11 ENCOUNTER — Ambulatory Visit (HOSPITAL_COMMUNITY): Payer: Self-pay

## 2018-01-11 ENCOUNTER — Encounter (HOSPITAL_COMMUNITY): Payer: PPO

## 2018-01-14 ENCOUNTER — Encounter (HOSPITAL_COMMUNITY)
Admission: RE | Admit: 2018-01-14 | Discharge: 2018-01-14 | Disposition: A | Discharge status: Home / Self Care | Payer: PPO | Source: Ambulatory Visit | Attending: Cardiology | Admitting: Cardiology

## 2018-01-14 ENCOUNTER — Ambulatory Visit (HOSPITAL_COMMUNITY): Payer: Self-pay

## 2018-01-14 VITALS — Ht 71.0 in | Wt 216.9 lb

## 2018-01-14 DIAGNOSIS — E7800 Pure hypercholesterolemia, unspecified: Secondary | ICD-10-CM | POA: Diagnosis not present

## 2018-01-14 DIAGNOSIS — I2510 Atherosclerotic heart disease of native coronary artery without angina pectoris: Secondary | ICD-10-CM | POA: Diagnosis not present

## 2018-01-14 DIAGNOSIS — Z951 Presence of aortocoronary bypass graft: Secondary | ICD-10-CM | POA: Insufficient documentation

## 2018-01-14 DIAGNOSIS — Z8249 Family history of ischemic heart disease and other diseases of the circulatory system: Secondary | ICD-10-CM | POA: Diagnosis not present

## 2018-01-14 DIAGNOSIS — Z79899 Other long term (current) drug therapy: Secondary | ICD-10-CM | POA: Insufficient documentation

## 2018-01-14 DIAGNOSIS — Z87442 Personal history of urinary calculi: Secondary | ICD-10-CM | POA: Diagnosis not present

## 2018-01-14 DIAGNOSIS — I10 Essential (primary) hypertension: Secondary | ICD-10-CM | POA: Insufficient documentation

## 2018-01-14 DIAGNOSIS — Z7901 Long term (current) use of anticoagulants: Secondary | ICD-10-CM | POA: Diagnosis not present

## 2018-01-14 DIAGNOSIS — Z7982 Long term (current) use of aspirin: Secondary | ICD-10-CM | POA: Insufficient documentation

## 2018-01-14 DIAGNOSIS — Z7984 Long term (current) use of oral hypoglycemic drugs: Secondary | ICD-10-CM | POA: Insufficient documentation

## 2018-01-14 DIAGNOSIS — E119 Type 2 diabetes mellitus without complications: Secondary | ICD-10-CM | POA: Insufficient documentation

## 2018-01-16 ENCOUNTER — Ambulatory Visit (HOSPITAL_COMMUNITY): Payer: Self-pay

## 2018-01-16 ENCOUNTER — Ambulatory Visit: Payer: PPO | Admitting: Cardiology

## 2018-01-16 ENCOUNTER — Encounter: Payer: Self-pay | Admitting: Cardiology

## 2018-01-16 ENCOUNTER — Encounter (HOSPITAL_COMMUNITY): Payer: PPO

## 2018-01-16 VITALS — BP 136/80 | HR 54 | Ht 71.0 in | Wt 221.0 lb

## 2018-01-16 DIAGNOSIS — I255 Ischemic cardiomyopathy: Secondary | ICD-10-CM | POA: Diagnosis not present

## 2018-01-16 DIAGNOSIS — I493 Ventricular premature depolarization: Secondary | ICD-10-CM

## 2018-01-16 DIAGNOSIS — I25810 Atherosclerosis of coronary artery bypass graft(s) without angina pectoris: Secondary | ICD-10-CM

## 2018-01-16 DIAGNOSIS — I10 Essential (primary) hypertension: Secondary | ICD-10-CM

## 2018-01-16 DIAGNOSIS — I471 Supraventricular tachycardia: Secondary | ICD-10-CM

## 2018-01-16 NOTE — Progress Notes (Signed)
Electrophysiology Office Note   Date:  01/16/2018   ID:  Taylor [name], DOB April 13, 1950, MRN [id number]  PCP:  Haywood Pao, MD  Cardiologist:  Ellyn Hack Primary Electrophysiologist:  Wassim Kirksey Meredith Leeds, MD    No chief complaint on file.    History of Present Illness: Taylor [name] is a 67 y.o. male who is being seen today for the evaluation of PVCs, SVT at the request of [name]. Presenting today for electrophysiology evaluation.  He has a history of coronary disease status post CABG, PVCs with ventricular bigeminy, and SVT.  He has had a few episodes of palpitations.  His first episode was October 22 when he went to the emergency room.  He was at work, felt his heart rate with associated chest tightness and his blood pressure of 170/110.  His heart rate was 160 bpm.  He was also mildly short of breath.  In the emergency room, his heart rate had returned back to normal.  2 days later, he noted his blood pressure and heart rate went up.  This lasted 20 to 30 minutes.  Heart rates were in the 160s.  It was thought that he had atrial fibrillation and thus he was put on anticoagulation.  He he developed epistaxis and had an ENT procedure.  He 5 mg Toprol-XL twice a day.  He has had one short episode of SVT since that time.  Today, he denies symptoms of palpitations, chest pain, shortness of breath, orthopnea, PND, lower extremity edema, claudication, dizziness, presyncope, syncope, bleeding, or neurologic sequela. The patient is tolerating medications without difficulties.    Past Medical History:  Diagnosis Date  . Bigeminal rhythm 08/01/2017   per EKG 07-24-2017 in epic (per confirmation new since last ecg in 2007)  in care everywhere in epic ecg dated 2012 shows sinus brady rate 53 no pvc's --per pt asymptomatic and   . Bradycardia   . Coronary artery disease involving native coronary artery 08/2017   Abnormal Myoview 08/2017 (Abnormal EKG - Severe ST depressions, PVCs &  nsVT). TID noted.  Moderate LAD territory Ischemic Perfusion defect --> Cath with extensive RCA (RPDA & RPAV), Cx & LAD disease --> CABG x 4  . Family history of premature coronary artery disease    Father - MI 55. Paternal uncle x 2 -1 with MI & other with CABG in 59s  . History of contusion    ED visit 08-04-2013 , 9 days past injury ,  dx concussion w/ LOC, per CT inferior frontal lobes contusion hemarrhage-- residual headache (08-01-2017  per pt residual resolved)  . History of kidney stones   . Hypercholesterolemia   . Hypertension   . Nephrolithiasis    bilateral nonobstructive per CT 07-24-2017  . S/P CABG x 4 09/06/2017   LIMA to LAD, SVG to OM2, Sequential SVG to PDA and RPL2, EVH via right thigh and leg  . Type 2 diabetes mellitus ([place])    followed by pcp  . Ureteral calculi    left  . Wears glasses   . Wears hearing aid in both ears    Past Surgical History:  Procedure Laterality Date  . CORONARY ARTERY BYPASS GRAFT N/A 09/06/2017   Procedure: CORONARY ARTERY BYPASS GRAFTING (CABG) x 4, using left internal mammary artery and right leg greater saphenous vein harvested endoscopically;  Surgeon: Rexene Alberts, MD;  Location: [facility];  Service: Open Heart Surgery;; LIMA-LAD, SVG-OM2, SeqSVG-RPDA-RPL2  . CYSTOSCOPY/RETROGRADE/URETEROSCOPY Left 08/06/2017   Procedure:  CYSTOSCOPY/RETROGRADE/URETEROSCOPY laser and stone basket extrection;  Surgeon: Cleon Gustin, MD;  Location: [facility];  Service: Urology;  Laterality: Left;  . LAPAROSCOPIC APPENDECTOMY  11-18-2005    dr wyatt  [facility]  . LEFT HEART CATH AND CORONARY ANGIOGRAPHY N/A 09/03/2017   Procedure: LEFT HEART CATH AND CORONARY ANGIOGRAPHY;  Surgeon: Leonie Man, MD;  Location: [place] [facility];  Service: Cardiovascular;; mRCA 70%, dRCA 99%-95% into RPAV & 90% ostRPDA 95%. RPL1 70%, distal RVAM (tandem PDA) - 85%. Ost-prox LAD 40% -pLAD 60&90% @ D1 ->p-mLAD 95% @ D2 with 70% D2. pCx 80%, p-mCx 90%-> mCx  85%.  EF 45-50%, global HK. -->> referred for CABG  . NASAL ENDOSCOPY WITH EPISTAXIS CONTROL N/A 11/29/2017   Procedure: NASAL ENDOSCOPY WITH EPISTAXIS CONTROL;  Surgeon: Leta Baptist, MD;  Location: [place];  Service: ENT;  Laterality: N/A;  . NM MYOVIEW LTD  08/31/2017   HIGH RISK: Combination Imaging & EKG:  with  in tmedium sized, moderate severity Reversible perfusion e apdefect in  the mid and apical anterior, apical lateral walls - FINDINGS ARE C/W ISCHEMIA in the LAD territory,  however given significant arrhythmias, severe ST depressions and TID =  MV CAD is highly likely (SDS =7).   . ORCHIECTOMY  1965   unilateral -- per pt ruptured  . TEE WITHOUT CARDIOVERSION N/A 09/06/2017   Procedure: TRANSESOPHAGEAL ECHOCARDIOGRAM (TEE);  INTRA-OP.  Surgeon: Rexene Alberts, MD;  Location: [facility] OR;  Service: Open Heart Surgery:;; Normal AoV, MV & TV.  Normal RV.  Marland Kitchen TRANSTHORACIC ECHOCARDIOGRAM  09/04/2017   (pre-CABG): EF 40-45%, diffuse HK.  Difficult to assess due to frequent PVCs. Severe LA dilation. Mildly increased PAP.  Marland Kitchen UMBILICAL HERNIA REPAIR  2006  . VARICOSE VEIN SURGERY  2007   left endovascular laser ablation     Current Outpatient Medications  Medication Sig Dispense Refill  . aspirin EC 81 MG tablet Take 1 tablet (81 mg total) by mouth daily. 90 tablet 3  . atorvastatin (LIPITOR) 80 MG tablet Take 1 tablet (80 mg total) by mouth daily at 6 PM. 90 tablet 3  . lisinopril (PRINIVIL,ZESTRIL) 10 MG tablet TAKE 1 TABLET BY MOUTH EVERY DAY 30 tablet 5  . metoprolol succinate (TOPROL XL) 25 MG 24 hr tablet Take 1 tablet (25 mg total) by mouth 2 (two) times daily. 180 tablet 3  . metoprolol tartrate (LOPRESSOR) 25 MG tablet Take 1 tablet (25 mg total) by mouth as needed. TAKE FOR PROLONG INCREASE HEART RATE OR PALPATIONS 20 tablet 6  . Multiple Vitamin (MULTIVITAMIN) tablet Take 1 tablet by mouth daily.    . sitaGLIPtan-metformin (JANUMET) 50-500 MG per tablet Take 1 tablet  by mouth 2 (two) times daily with a meal.      No current facility-administered medications for this visit.     Allergies:   Patient has no known allergies.   Social History:  The patient  reports that he has never smoked. He has never used smokeless tobacco. He reports that he does not drink alcohol or use drugs.   Family History:  The patient's family history includes Atrial fibrillation in his mother; CAD in his paternal uncle; Clotting disorder in his father; Diabetes in his daughter; Heart attack in his paternal uncle; Heart attack (age of onset: 96) in his father; Heart disease in his other; Stroke in his mother and other.    ROS:  Please see the history of present illness.  Otherwise, review of systems is positive for none.   All other systems are reviewed and negative.    PHYSICAL EXAM: VS:  BP 136/80   Pulse (!) 54   Ht 5\' 11"  (1.803 m)   Wt 221 lb (100.2 kg)   SpO2 95%   BMI 30.82 kg/m  , BMI Body mass index is 30.82 kg/m. GEN: Well nourished, well developed, in no acute distress  HEENT: normal  Neck: no JVD, carotid bruits, or masses Cardiac: RRR; no murmurs, rubs, or gallops,no edema  Respiratory:  clear to auscultation bilaterally, normal work of breathing GI: soft, nontender, nondistended, + BS MS: no deformity or atrophy  Skin: warm and dry Neuro:  Strength and sensation are intact Psych: euthymic mood, full affect  EKG:  EKG is ordered today. Personal review of the ekg ordered shows sinus rhythm, rate 54, LVH with T wave flattening  Recent Labs: 11/21/2017: ALT 22 01/04/2018: BUN 21; Creatinine, Ser 1.41; Hemoglobin 12.3; Magnesium 1.7; Platelets 246; Potassium 4.3; Sodium 139    Lipid Panel  No results found for: CHOL, TRIG, HDL, CHOLHDL, VLDL, LDLCALC, LDLDIRECT   Wt Readings from Last 3 Encounters:  01/16/18 221 lb (100.2 kg)  01/14/18 216 lb 14.9 oz (98.4 kg)  01/09/18 220 lb (99.8 kg)      Other studies Reviewed: Additional studies/ records  that were reviewed today include: TTE 12/01/17  Review of the above records today demonstrates:  - Left ventricle: The cavity size was mildly dilated. Systolic   function was mildly to moderately reduced. The estimated ejection   fraction was in the range of 40% to 45%. Mild diffuse hypokinesis   with no identifiable regional variations. Features are consistent   with a pseudonormal left ventricular filling pattern, with   concomitant abnormal relaxation and increased filling pressure   (grade 2 diastolic dysfunction). Doppler parameters are   consistent with high ventricular filling pressure. - Aortic valve: Trileaflet; normal thickness, moderately calcified   leaflets. - Left atrium: The atrium was severely dilated. - Pulmonic valve: There was mild regurgitation. - Pulmonary arteries: PA peak pressure: 35 mm Hg (S).  Monitor 11/18/17 - personally reviewed  Mostly sinus rhythm. Frequent PVCs (12% burden), rare PACs.  Multiple short runs of 4-5 beats of PVCs/short VT.  Several episodes of short runs SVT with at least 3-4 prolonged runs of SVT (favor SVT over A. fib)  No prolonged ventricular tachycardia.   30-day event monitor shows several episodes of SVT (reported as a fib, but appears to be more related to SVT).  Very high PVC burden of 12%. Short runs of 4-5 beats VT   ASSESSMENT AND PLAN:  1.  PVCs: Currently has 12% PVCs.  He is minimally symptomatic from this.  Echo shows a relatively stable ejection fraction.  We Dee Maday hold off on therapy at this time.  2.  SVT: Appears to be due to AVNRT.  I do not see any obvious evidence of atrial fibrillation on his monitor.  He has had minimal palpitations since his Toprol XL was increased to twice daily.  We Aribelle Mccosh continue to monitor over the next few months.  He would be amenable to ablation if he has further episodes.  3.  Hypertension: Mildly elevated but has been normal in the past.  4.  Ischemic cardiomyopathy: EF 40 to 45%.   Stable.  No signs of volume  5.  Coronary artery disease status post CABG: No current chest pain.    Current medicines are  reviewed at length with the patient today.   The patient does not have concerns regarding his medicines.  The following changes were made today:  none  Labs/ tests ordered today include:  Orders Placed This Encounter  Procedures  . EKG 12-Lead   Case discussed with primary cardiology  Disposition:   FU with Nyoka Alcoser 3 months  Signed, Lavarius Doughten Meredith Leeds, MD  01/16/2018 9:34 AM     [facility] [address] [place] [address] [phone] (office) [phone] (fax)

## 2018-01-16 NOTE — Patient Instructions (Signed)
Medication Instructions:  Your physician recommends that you continue on your current medications as directed. Please refer to the Current Medication list given to you today.  If you need a refill on your cardiac medications before your next appointment, please call your pharmacy.   Lab work: None ordered  Testing/Procedures: None ordered  Follow-Up: At [facility], you and your health needs are our priority.  As part of our continuing mission to provide you with exceptional heart care, we have created designated Provider Care Teams.  These Care Teams include your primary Cardiologist (physician) and Advanced Practice Providers (APPs -  Physician Assistants and Nurse Practitioners) who all work together to provide you with the care you need, when you need it. . You will need a follow up appointment in 3 months with Dr. Curt Bears  Thank you for choosing [facility]!!   Trinidad Curet, RN [phone]  Any Other Special Instructions Will Be Listed Below (If Applicable).   Cardiac Ablation Cardiac ablation is a procedure to stop some heart tissue from causing problems. The heart has many electrical connections. Sometimes these connections make the heart beat very fast or irregularly. Removing some problem areas can improve the heart rhythm or make it normal. What happens before the procedure?  Follow instructions from your doctor about what you cannot eat or drink.  Ask your doctor about: ? Changing or stopping your normal medicines. This is important if you take diabetes medicines or blood thinners. ? Taking medicines such as aspirin and ibuprofen. These medicines can thin your blood. Do not take these medicines before your procedure if your doctor tells you not to.  Plan to have someone take you home.  If you will be going home right after the procedure, plan to have someone with you for 24 hours. What happens during the procedure?  To lower your risk of infection: ? Your  health care team will wash or sanitize their hands. ? Your skin will be washed with soap. ? Hair may be removed from your neck or groin.  An IV tube will be put into one of your veins.  You will be given a medicine to help you relax (sedative).  Skin on your neck or groin will be numbed.  A cut (incision) will be made in your neck or groin.  A needle will be put through your cut and into a vein in your neck or groin.  A tube (catheter) will be put into the needle. The tube will be moved to your heart. X-rays (fluoroscopy) will be used to help guide the tube.  Small devices (electrodes) on the tip of the tube will send out electrical currents.  Dye may be put through the tube. This helps your surgeon see your heart.  Electrical energy will be used to scar (ablate) some heart tissue. Your surgeon may use: ? Heat (radiofrequency energy). ? Laser energy. ? Extreme cold (cryoablation).  The tube will be taken out.  Pressure will be held on your cut. This helps stop bleeding.  A bandage (dressing) will be put on your cut. The procedure may vary. What happens after the procedure?  You will be monitored until your medicines have worn off.  Your cut will be watched for bleeding. You will need to lie still for a few hours.  Do not drive for 24 hours or as long as your doctor tells you. Summary  Cardiac ablation is a procedure to stop some heart tissue from causing problems.  English as a second language teacher  will be used to scar (ablate) some heart tissue. This information is not intended to replace advice given to you by your health care provider. Make sure you discuss any questions you have with your health care provider. Document Released: 10/02/2012 Document Revised: 12/20/2015 Document Reviewed: 12/20/2015 Elsevier Interactive Patient Education  2017 [employer].   3

## 2018-01-18 ENCOUNTER — Encounter (HOSPITAL_COMMUNITY)
Admission: RE | Admit: 2018-01-18 | Discharge: 2018-01-18 | Disposition: A | Discharge status: Home / Self Care | Payer: PPO | Source: Ambulatory Visit | Attending: Cardiology | Admitting: Cardiology

## 2018-01-18 ENCOUNTER — Ambulatory Visit (HOSPITAL_COMMUNITY): Payer: Self-pay

## 2018-01-18 DIAGNOSIS — Z951 Presence of aortocoronary bypass graft: Secondary | ICD-10-CM | POA: Diagnosis not present

## 2018-01-21 ENCOUNTER — Ambulatory Visit (HOSPITAL_COMMUNITY): Payer: Self-pay

## 2018-01-21 ENCOUNTER — Encounter (HOSPITAL_COMMUNITY)
Admission: RE | Admit: 2018-01-21 | Discharge: 2018-01-21 | Disposition: A | Discharge status: Home / Self Care | Payer: PPO | Source: Ambulatory Visit | Attending: Cardiology | Admitting: Cardiology

## 2018-01-21 DIAGNOSIS — Z951 Presence of aortocoronary bypass graft: Secondary | ICD-10-CM | POA: Diagnosis not present

## 2018-01-23 ENCOUNTER — Encounter (HOSPITAL_COMMUNITY)
Admission: RE | Admit: 2018-01-23 | Discharge: 2018-01-23 | Disposition: A | Discharge status: Home / Self Care | Payer: PPO | Source: Ambulatory Visit | Attending: Cardiology | Admitting: Cardiology

## 2018-01-23 ENCOUNTER — Ambulatory Visit (HOSPITAL_COMMUNITY): Payer: Self-pay

## 2018-01-23 DIAGNOSIS — Z951 Presence of aortocoronary bypass graft: Secondary | ICD-10-CM

## 2018-01-23 NOTE — Progress Notes (Signed)
Cardiac Individual Treatment Plan  Patient Details  Name: Taylor [name] MRN: [id number] Date of Birth: 16-Dec-1950 Referring Provider:   Flowsheet Row CARDIAC REHAB PHASE II ORIENTATION from 10/25/2017 in [place]  Referring Provider  Leonie Man MD       Initial Encounter Date:  [place] PHASE II ORIENTATION from 10/25/2017 in [place]  Date  10/25/17      Visit Diagnosis: S/P CABG x 4 09/06/17  Patient's Home Medications on Admission:  Current Outpatient Medications:  .  aspirin EC 81 MG tablet, Take 1 tablet (81 mg total) by mouth daily., Disp: 90 tablet, Rfl: 3 .  atorvastatin (LIPITOR) 80 MG tablet, Take 1 tablet (80 mg total) by mouth daily at 6 PM., Disp: 90 tablet, Rfl: 3 .  lisinopril (PRINIVIL,ZESTRIL) 10 MG tablet, TAKE 1 TABLET BY MOUTH EVERY DAY, Disp: 30 tablet, Rfl: 5 .  metoprolol succinate (TOPROL XL) 25 MG 24 hr tablet, Take 1 tablet (25 mg total) by mouth 2 (two) times daily., Disp: 180 tablet, Rfl: 3 .  metoprolol tartrate (LOPRESSOR) 25 MG tablet, Take 1 tablet (25 mg total) by mouth as needed. TAKE FOR PROLONG INCREASE HEART RATE OR PALPATIONS, Disp: 20 tablet, Rfl: 6 .  Multiple Vitamin (MULTIVITAMIN) tablet, Take 1 tablet by mouth daily., Disp: , Rfl:  .  sitaGLIPtan-metformin (JANUMET) 50-500 MG per tablet, Take 1 tablet by mouth 2 (two) times daily with a meal. , Disp: , Rfl:   Past Medical History: Past Medical History:  Diagnosis Date  . Bigeminal rhythm 08/01/2017   per EKG 07-24-2017 in epic (per confirmation new since last ecg in 2007)  in care everywhere in epic ecg dated 2012 shows sinus brady rate 53 no pvc's --per pt asymptomatic and   . Bradycardia   . Coronary artery disease involving native coronary artery 08/2017   Abnormal Myoview 08/2017 (Abnormal EKG - Severe ST depressions, PVCs & nsVT). TID noted.  Moderate LAD territory Ischemic Perfusion defect  --> Cath with extensive RCA (RPDA & RPAV), Cx & LAD disease --> CABG x 4  . Family history of premature coronary artery disease    Father - MI 40. Paternal uncle x 2 -1 with MI & other with CABG in 76s  . History of contusion    ED visit 08-04-2013 , 9 days past injury ,  dx concussion w/ LOC, per CT inferior frontal lobes contusion hemarrhage-- residual headache (08-01-2017  per pt residual resolved)  . History of kidney stones   . Hypercholesterolemia   . Hypertension   . Nephrolithiasis    bilateral nonobstructive per CT 07-24-2017  . S/P CABG x 4 09/06/2017   LIMA to LAD, SVG to OM2, Sequential SVG to PDA and RPL2, EVH via right thigh and leg  . Type 2 diabetes mellitus ([place])    followed by pcp  . Ureteral calculi    left  . Wears glasses   . Wears hearing aid in both ears     Tobacco Use: Social History   Tobacco Use  Smoking Status Never Smoker  Smokeless Tobacco Never Used    Labs: Recent Review Flowsheet Data    Labs for ITP Cardiac and Pulmonary Rehab Latest Ref Rng & Units 09/06/2017 09/06/2017 09/06/2017 09/06/2017 09/07/2017   Hemoglobin A1c 4.8 - 5.6 % - - - - -   PHART 7.350 - 7.450 7.354 7.351 7.339(L) - -   PCO2ART 32.0 -  48.0 mmHg 41.5 37.2 43.0 - -   HCO3 20.0 - 28.0 mmol/L 23.4 20.4 23.2 - -   TCO2 22 - 32 mmol/L 25 22 25 23 22    ACIDBASEDEF 0.0 - 2.0 mmol/L 2.0 5.0(H) 3.0(H) - -   O2SAT % 97.0 98.0 99.0 - -      Capillary Blood Glucose: Lab Results  Component Value Date   GLUCAP 126 (H) 11/29/2017   GLUCAP 154 (H) 11/29/2017   GLUCAP 129 (H) 09/11/2017   GLUCAP 130 (H) 09/10/2017   GLUCAP 101 (H) 09/10/2017     Exercise Target Goals: Exercise Program Goal: Individual exercise prescription set using results from initial 6 min walk test and THRR while considering  patient's activity barriers and safety.   Exercise Prescription Goal: Initial exercise prescription builds to 30-45 minutes a day of aerobic activity, 2-3 days per week.  Home exercise  guidelines will be given to patient during program as part of exercise prescription that the participant will acknowledge.  Activity Barriers & Risk Stratification: Activity Barriers & Cardiac Risk Stratification - 10/25/17 1145    Activity Barriers & Cardiac Risk Stratification          Activity Barriers  None    Cardiac Risk Stratification  High           6 Minute Walk: 6 Minute Walk    6 Minute Walk    Row Name 10/25/17 1144 01/14/18 0944   Phase  Initial  Discharge   Distance  1710 feet  2000 feet   Distance % Change  no documentation  16.96 %   Distance Feet Change  no documentation  290 ft   Walk Time  6 minutes  6 minutes   # of Rest Breaks  0  0   MPH  3.24  3.78   METS  3.77  4.26   RPE  11  11   Perceived Dyspnea   0  no documentation   VO2 Peak  13.2  14.93   Symptoms  No  no documentation   Resting HR  63 bpm  65 bpm   Resting BP  110/70  118/68   Resting Oxygen Saturation   97 %  no documentation   Exercise Oxygen Saturation  during 6 min walk  97 %  no documentation   Max Ex. HR  89 bpm  103 bpm   Max Ex. BP  148/60  140/80   2 Minute Post BP  124/72  116/78          Oxygen Initial Assessment:   Oxygen Re-Evaluation:   Oxygen Discharge (Final Oxygen Re-Evaluation):   Initial Exercise Prescription: Initial Exercise Prescription - 10/25/17 1100    Date of Initial Exercise RX and Referring Provider          Date  10/25/17    Referring Provider  Leonie Man MD     Expected Discharge Date  01/18/18        Treadmill          MPH  3    Grade  1    Minutes  10    METs  3.71        Bike          Level  1.2    Minutes  10    METs  3.38        NuStep          Level  2    SPM  75  Minutes  10    METs  2.8        Prescription Details          Frequency (times per week)  3x    Duration  Progress to 30 minutes of continuous aerobic without signs/symptoms of physical distress        Intensity          THRR 40-80% of Max  Heartrate  61-122    Ratings of Perceived Exertion  11-13    Perceived Dyspnea  0-4        Progression          Progression  Continue progressive overload as per policy without signs/symptoms or physical distress.        Resistance Training          Training Prescription  Yes    Weight  3lbs    Reps  10-15           Perform Capillary Blood Glucose checks as needed.  Exercise Prescription Changes: Exercise Prescription Changes    Response to Exercise    Row Name 11/05/17 1413 11/14/17 1415 11/19/17 1506 12/12/17 1458 01/01/18 1445   Blood Pressure (Admit)  120/60  124/78  154/80  132/80  138/80   Blood Pressure (Exercise)  158/68  168/66  140/80  152/84  140/72   Blood Pressure (Exit)  120/62  126/72  120/70  120/80  124/72   Heart Rate (Admit)  84 bpm  65 bpm  62 bpm  60 bpm  57 bpm   Heart Rate (Exercise)  115 bpm  115 bpm  115 bpm  123 bpm  118 bpm   Heart Rate (Exit)  80 bpm  65 bpm  68 bpm  70 bpm  67 bpm   Rating of Perceived Exertion (Exercise)  12  12  12  11  12    Perceived Dyspnea (Exercise)  0  0  0  0  0   Symptoms  None  None  None  None  None   Comments  Pt oriented to exercise equipment   no documentation  no documentation  no documentation  no documentation   Duration  Progress to 30 minutes of  aerobic without signs/symptoms of physical distress  Continue with 30 min of aerobic exercise without signs/symptoms of physical distress.  Continue with 30 min of aerobic exercise without signs/symptoms of physical distress.  Continue with 30 min of aerobic exercise without signs/symptoms of physical distress.  Continue with 30 min of aerobic exercise without signs/symptoms of physical distress.   Intensity  THRR unchanged  THRR unchanged  THRR unchanged  THRR unchanged  THRR unchanged       Progression    Row Name 11/05/17 1413 11/14/17 1415 11/19/17 1506 12/12/17 1458 01/01/18 1445   Progression  Continue to progress workloads to maintain intensity without  signs/symptoms of physical distress.  Continue to progress workloads to maintain intensity without signs/symptoms of physical distress.  Continue to progress workloads to maintain intensity without signs/symptoms of physical distress.  Continue to progress workloads to maintain intensity without signs/symptoms of physical distress.  Continue to progress workloads to maintain intensity without signs/symptoms of physical distress.   Average METs  3.14  3.71  3.77  3.92  4.62       Resistance Training    Row Name 11/05/17 1413 11/14/17 1415 11/19/17 1506 12/12/17 1458 01/01/18 1445   Training Prescription  Yes  No  Yes  No  Yes  Weight  3lbs  no documentation  3lbs  no documentation  5 lbs.    Reps  10-15  no documentation  10-15  no documentation  10-15   Time  no documentation  no documentation  10 Minutes  no documentation  10 Minutes       Interval Training    Row Name 11/05/17 1413 11/14/17 1415 11/19/17 1506 12/12/17 1458 01/01/18 1445   Interval Training  No  No  No  No  No       Treadmill    Row Name 11/05/17 1413 11/14/17 1415 11/19/17 1506 12/12/17 1458 01/01/18 1445   MPH  3  3.4  3.4  3.4  3.4   Grade  1  2  2  2  2    Minutes  10  10  10  10  10    METs  3.71  4.54  4.54  4.54  4.54       Bike    Row Name 11/05/17 1413 11/14/17 1415 11/19/17 1506 12/12/17 1458 01/01/18 1445   Level  1.2  1.2  1.2  1.2  2.1   Minutes  10  10  10  10  10    METs  3.38  3.38  3.38  3.32  4.93       NuStep    Row Name 11/05/17 1413 11/14/17 1415 11/19/17 1506 12/12/17 1458 01/01/18 1445   Level  2  4  4  4  4    SPM  75  95  95  95  95   Minutes  10  10  10  10  10    METs  2.3  3.2  3.4  3.9  4.4       Home Exercise Plan    Row Name 11/05/17 1413 11/14/17 1415 11/19/17 1506 12/12/17 1458 01/01/18 1445   Plans to continue exercise at  no documentation  no documentation  Home (comment) Walking  Home (comment) Walking  Home (comment) Walking   Frequency  no documentation  no documentation   Add 3 additional days to program exercise sessions.  Add 3 additional days to program exercise sessions.  Add 3 additional days to program exercise sessions.   Initial Home Exercises Provided  no documentation  no documentation  11/23/17  11/23/17  11/23/17       Response to Exercise    Row Name 01/17/18 1400   Blood Pressure (Admit)  154/80   Blood Pressure (Exercise)  140/80   Blood Pressure (Exit)  120/70   Heart Rate (Admit)  62 bpm   Heart Rate (Exercise)  115 bpm   Heart Rate (Exit)  68 bpm   Rating of Perceived Exertion (Exercise)  12   Perceived Dyspnea (Exercise)  0   Symptoms  None   Duration  Continue with 30 min of aerobic exercise without signs/symptoms of physical distress.   Intensity  THRR unchanged       Progression    Row Name 01/17/18 1400   Progression  Continue to progress workloads to maintain intensity without signs/symptoms of physical distress.   Average METs  3.77       Resistance Training    Row Name 01/17/18 1400   Training Prescription  No       Interval Training    Row Name 01/17/18 1400   Interval Training  No       Treadmill    Row Name 01/17/18 1400   MPH  3.4  Grade  2   Minutes  10   METs  4.54       Bike    Row Name 01/17/18 1400   Level  1.2   Minutes  10   METs  3.38       NuStep    Row Name 01/17/18 1400   Level  4   SPM  95   Minutes  10   METs  3.4       Home Exercise Plan    [place] Name 01/17/18 1400   Plans to continue exercise at  Home (comment) Walking   Frequency  Add 3 additional days to program exercise sessions.   Initial Home Exercises Provided  11/23/17          Exercise Comments: Exercise Comments    Row Name 11/05/17 1412 11/23/17 1103 12/12/17 1500 01/02/18 0922 01/22/18 1417   Exercise Comments  Pt's first day of exericse. Pt oriented to exercise equipment. Pt responded well to exercise. Will continue to monitor.   Reviewed HEP with pt. Pt is currently exercsiing at home and will continue to  monitor.   Pt has progress MET levels with each session. Is tolerating exercise well. Continues to exercise at home.   Reviewed METs and goals with Pt. Will continue to exercise at home in addition to cardiac rehab.   Reviewed METs and goals with Pt. Will continue to exercise at home in addition to cardiac rehab.       Exercise Goals and Review: Exercise Goals    Exercise Goals    Row Name 10/25/17 1145   Increase Physical Activity  Yes   Intervention  Develop an individualized exercise prescription for aerobic and resistive training based on initial evaluation findings, risk stratification, comorbidities and participant's personal goals.;Provide advice, education, support and counseling about physical activity/exercise needs.   Expected Outcomes  Short Term: Attend rehab on a regular basis to increase amount of physical activity.;Long Term: Add in home exercise to make exercise part of routine and to increase amount of physical activity.;Long Term: Exercising regularly at least 3-5 days a week.   Increase Strength and Stamina  Yes   Intervention  Develop an individualized exercise prescription for aerobic and resistive training based on initial evaluation findings, risk stratification, comorbidities and participant's personal goals.;Provide advice, education, support and counseling about physical activity/exercise needs.   Expected Outcomes  Short Term: Increase workloads from initial exercise prescription for resistance, speed, and METs.;Short Term: Perform resistance training exercises routinely during rehab and add in resistance training at home;Long Term: Improve cardiorespiratory fitness, muscular endurance and strength as measured by increased METs and functional capacity (6MWT)   Able to understand and use rate of perceived exertion (RPE) scale  Yes   Intervention  Provide education and explanation on how to use RPE scale   Expected Outcomes  Short Term: Able to use RPE daily in rehab to  express subjective intensity level;Long Term:  Able to use RPE to guide intensity level when exercising independently   Knowledge and understanding of Target Heart Rate Range (THRR)  Yes   Intervention  Provide education and explanation of THRR including how the numbers were predicted and where they are located for reference   Expected Outcomes  Short Term: Able to state/look up THRR;Long Term: Able to use THRR to govern intensity when exercising independently;Short Term: Able to use daily as guideline for intensity in rehab   Able to check pulse independently  Yes   Intervention  Review the importance of being able to check your own pulse for safety during independent exercise;Provide education and demonstration on how to check pulse in carotid and radial arteries.   Expected Outcomes  Short Term: Able to explain why pulse checking is important during independent exercise;Long Term: Able to check pulse independently and accurately   Understanding of Exercise Prescription  Yes   Intervention  Provide education, explanation, and written materials on patient's individual exercise prescription   Expected Outcomes  Short Term: Able to explain program exercise prescription;Long Term: Able to explain home exercise prescription to exercise independently   Improve claudication pain toleration; Improve walking ability  Yes   Intervention  Participate in PAD/SET Rehab 2-3 days a week, walking at home as part of exercise prescription;Attend education sessions to aid in risk factor modification and understanding of disease process   Expected Outcomes  Short Term: Improve walking distance/time to onset of claudication pain;Long Term: Improve score of PAD questionnaires;Long Term: Improve walking ability and toleration to claudication          Exercise Goals Re-Evaluation : Exercise Goals Re-Evaluation    Exercise Goal Re-Evaluation    Row Name 11/23/17 1105 12/12/17 1459 01/02/18 0920 01/22/18 1417    Exercise Goals Review  Increase Physical Activity;Able to understand and use rate of perceived exertion (RPE) scale;Knowledge and understanding of Target Heart Rate Range (THRR);Understanding of Exercise Prescription;Increase Strength and Stamina;Able to check pulse independently  Increase Physical Activity;Able to understand and use rate of perceived exertion (RPE) scale;Knowledge and understanding of Target Heart Rate Range (THRR);Understanding of Exercise Prescription;Increase Strength and Stamina;Able to check pulse independently  Increase Physical Activity;Increase Strength and Stamina;Able to understand and use rate of perceived exertion (RPE) scale;Knowledge and understanding of Target Heart Rate Range (THRR);Understanding of Exercise Prescription;Able to check pulse independently  Increase Physical Activity;Increase Strength and Stamina;Able to check pulse independently;Understanding of Exercise Prescription;Knowledge and understanding of Target Heart Rate Range (THRR);Able to understand and use rate of perceived exertion (RPE) scale   Comments  Reviewed HEP with patient also reviewed THRR, NTG use, RPE scale, end points of exercise, and weather conditions.   Reviewed METs and goal with Pt. MET level has increased to 3.92. Continues to progress and tolerate exercise well.   Reviewed METs and goals with Pt. Pt continues to increase MET level. Has increased to 4.62. Continues to exercise at home by walking 1.5 miles 4 times per week.   Reviewed METs and goals with Pt. Pt continues to increase MET level. Has increased to 4.6. Continues to exercise at home by walking 1.5 miles 4 times per week.    Expected Outcomes  Patient will continue to walk 4 days a week for 45- 60 minutes per day. Patient will continue to increase stamina. Will continue to monitor and progress patient as tolerated.   Will continue to progress and monitor Pt.   Will continue to monitor and progress Pt as tolerated.   Will continue to  monitor and progress Pt as tolerated.           Discharge Exercise Prescription (Final Exercise Prescription Changes): Exercise Prescription Changes - 01/17/18 1400    Response to Exercise          Blood Pressure (Admit)  154/80    Blood Pressure (Exercise)  140/80    Blood Pressure (Exit)  120/70    Heart Rate (Admit)  62 bpm    Heart Rate (Exercise)  115 bpm    Heart Rate (Exit)  68 bpm    Rating of Perceived Exertion (Exercise)  12    Perceived Dyspnea (Exercise)  0    Symptoms  None    Duration  Continue with 30 min of aerobic exercise without signs/symptoms of physical distress.    Intensity  THRR unchanged        Progression          Progression  Continue to progress workloads to maintain intensity without signs/symptoms of physical distress.    Average METs  3.77        Resistance Training          Training Prescription  No        Interval Training          Interval Training  No        Treadmill          MPH  3.4    Grade  2    Minutes  10    METs  4.54        Bike          Level  1.2    Minutes  10    METs  3.38        NuStep          Level  4    SPM  95    Minutes  10    METs  3.4        Home Exercise Plan          Plans to continue exercise at  Home (comment)   Walking   Frequency  Add 3 additional days to program exercise sessions.    Initial Home Exercises Provided  11/23/17           Nutrition:  Target Goals: Understanding of nutrition guidelines, daily intake of sodium <1539m, cholesterol <2065m calories 30% from fat and 7% or less from saturated fats, daily to have 5 or more servings of fruits and vegetables.  Biometrics: Pre Biometrics - 10/25/17 1145    Pre Biometrics          Height  5' 11"  (1.803 m)    Weight  93.4 kg    Waist Circumference  42 inches    Hip Circumference  40 inches    Waist to Hip Ratio  1.05 %    BMI (Calculated)  28.73    Triceps Skinfold  16 mm    % Body Fat  28.8 %    Grip Strength  43  kg    Flexibility  10.5 in    Single Leg Stand  2.47 seconds          Post Biometrics - 01/14/18 0945     Post  Biometrics          Height  5' 11"  (1.803 m)    Weight  98.4 kg    Waist Circumference  43 inches    Hip Circumference  42.5 inches    Waist to Hip Ratio  1.01 %    BMI (Calculated)  30.27    Triceps Skinfold  20 mm    % Body Fat  30.7 %    Grip Strength  42 kg    Flexibility  8.5 in    Single Leg Stand  4.18 seconds           Nutrition Therapy Plan and Nutrition Goals: Nutrition Therapy & Goals - 10/25/17 0838    Nutrition Therapy  Diet  carb modified, heart healthy        Personal Nutrition Goals          Nutrition Goal  Pt to identify and limit food sources of saturated fat, trans fat, refined carbohydrates and sodium    Personal Goal #2  Improved blood glucose control as evidenced by pt's A1c trending from 7.6 toward less than 7.0.    Personal Goal #3  Pt able to name foods that affect blood glucose        Intervention Plan          Intervention  Prescribe, educate and counsel regarding individualized specific dietary modifications aiming towards targeted core components such as weight, hypertension, lipid management, diabetes, heart failure and other comorbidities.    Expected Outcomes  Short Term Goal: Understand basic principles of dietary content, such as calories, fat, sodium, cholesterol and nutrients.;Long Term Goal: Adherence to prescribed nutrition plan.           Nutrition Assessments: Nutrition Assessments - 10/25/17 0839    MEDFICTS Scores          Pre Score  32           Nutrition Goals Re-Evaluation:   Nutrition Goals Re-Evaluation:   Nutrition Goals Discharge (Final Nutrition Goals Re-Evaluation):   Psychosocial: Target Goals: Acknowledge presence or absence of significant depression and/or stress, maximize coping skills, provide positive support system. Participant is able to verbalize types and ability to use  techniques and skills needed for reducing stress and depression.  Initial Review & Psychosocial Screening: Initial Psych Review & Screening - 10/25/17 1233    Initial Review          Current issues with  None Identified        Family Dynamics          Good Support System?  Yes   [place] has his wife and children for support       Barriers          Psychosocial barriers to participate in program  There are no identifiable barriers or psychosocial needs.        Screening Interventions          Interventions  Encouraged to exercise           Quality of Life Scores: Quality of Life - 01/18/18 1019    Quality of Life          Select  Quality of Life        Quality of Life Scores          Health/Function Post  27.14 %    Socioeconomic Post  25 %    Psych/Spiritual Post  26.29 %    Family Post  28.8 %    GLOBAL Post  26.76 %          Scores of 19 and below usually indicate a poorer quality of life in these areas.  A difference of  2-3 points is a clinically meaningful difference.  A difference of 2-3 points in the total score of the Quality of Life Index has been associated with significant improvement in overall quality of life, self-image, physical symptoms, and general health in studies assessing change in quality of life.  PHQ-9: Recent Review Flowsheet Data    Depression screen [facility] 2/9 11/05/2017   Decreased Interest 0   Down, Depressed, Hopeless 0   PHQ - 2 Score 0     Interpretation of Total Score  Total Score  Depression Severity:  1-4 = Minimal depression, 5-9 = Mild depression, 10-14 = Moderate depression, 15-19 = Moderately severe depression, 20-27 = Severe depression   Psychosocial Evaluation and Intervention: Psychosocial Evaluation - 11/05/17 1505    Psychosocial Evaluation & Interventions          Interventions  Encouraged to exercise with the program and follow exercise prescription    Comments  no psychosocial needs identified, no interventions  necessary. pt enjoys riding his motorcylce and is eager to resume this activity.     Expected Outcomes  pt will exhibit positive outlook with good coping skills.     Continue Psychosocial Services   No Follow up required           Psychosocial Re-Evaluation: Psychosocial Re-Evaluation    Psychosocial Re-Evaluation    Row Name 11/15/17 1516 12/13/17 1106 01/03/18 0729 01/18/18 3893   Current issues with  None Identified  None Identified  None Identified  None Identified   Comments  no psychosocial needs identified, no interventions necessary   no psychosocial needs identified, no interventions necessary   no psychosocial needs identified, no interventions necessary   no psychosocial needs identified, no interventions necessary    Expected Outcomes  pt will exhibit positie outlook with good coping skills.   pt will exhibit positie outlook with good coping skills.   pt will exhibit positie outlook with good coping skills.   pt will exhibit positie outlook with good coping skills.    Interventions  Encouraged to attend Cardiac Rehabilitation for the exercise  Encouraged to attend Cardiac Rehabilitation for the exercise  Encouraged to attend Cardiac Rehabilitation for the exercise  Encouraged to attend Cardiac Rehabilitation for the exercise   Continue Psychosocial Services   No Follow up required  No Follow up required  No Follow up required  No Follow up required          Psychosocial Discharge (Final Psychosocial Re-Evaluation): Psychosocial Re-Evaluation - 01/18/18 7342    Psychosocial Re-Evaluation          Current issues with  None Identified    Comments  no psychosocial needs identified, no interventions necessary     Expected Outcomes  pt will exhibit positie outlook with good coping skills.     Interventions  Encouraged to attend Cardiac Rehabilitation for the exercise    Continue Psychosocial Services   No Follow up required           Vocational Rehabilitation: Provide  vocational rehab assistance to qualifying candidates.   Vocational Rehab Evaluation & Intervention: Vocational Rehab - 10/25/17 1235    Initial Vocational Rehab Evaluation & Intervention          Assessment shows need for Vocational Rehabilitation  No           Education: Education Goals: Education classes will be provided on a weekly basis, covering required topics. Participant will state understanding/return demonstration of topics presented.  Learning Barriers/Preferences: Learning Barriers/Preferences - 10/25/17 1141    Learning Barriers/Preferences          Learning Barriers  Sight    Learning Preferences  Verbal Instruction;Individual Instruction;Pictoral           Education Topics: Count Your Pulse:  -Group instruction provided by verbal instruction, demonstration, patient participation and written materials to support subject.  Instructors address importance of being able to find your pulse and how to count your pulse when at home without a heart monitor.  Patients get hands on experience counting their  pulse with staff help and individually. Flowsheet Row CARDIAC REHAB PHASE II EXERCISE from 01/23/2018 in [place]  Date  12/07/17  Instruction Review Code  2- Demonstrated Understanding      Heart Attack, Angina, and Risk Factor Modification:  -Group instruction provided by verbal instruction, video, and written materials to support subject.  Instructors address signs and symptoms of angina and heart attacks.    Also discuss risk factors for heart disease and how to make changes to improve heart health risk factors. Flowsheet Row CARDIAC REHAB PHASE II EXERCISE from 01/23/2018 in [place]  Date  01/23/18  Educator  RN  Instruction Review Code  2- Demonstrated Understanding      Functional Fitness:  -Group instruction provided by verbal instruction, demonstration, patient participation, and written  materials to support subject.  Instructors address safety measures for doing things around the house.  Discuss how to get up and down off the floor, how to pick things up properly, how to safely get out of a chair without assistance, and balance training.   Meditation and Mindfulness:  -Group instruction provided by verbal instruction, patient participation, and written materials to support subject.  Instructor addresses importance of mindfulness and meditation practice to help reduce stress and improve awareness.  Instructor also leads participants through a meditation exercise.  Flowsheet Row CARDIAC REHAB PHASE II EXERCISE from 01/23/2018 in [place]  Date  12/05/17  Educator  Jeanella Craze  Instruction Review Code  2- Demonstrated Understanding      Stretching for Flexibility and Mobility:  -Group instruction provided by verbal instruction, patient participation, and written materials to support subject.  Instructors lead participants through series of stretches that are designed to increase flexibility thus improving mobility.  These stretches are additional exercise for major muscle groups that are typically performed during regular warm up and cool down.   Hands Only CPR:  -Group verbal, video, and participation provides a basic overview of AHA guidelines for community CPR. Role-play of emergencies allow participants the opportunity to practice calling for help and chest compression technique with discussion of AED use.   Hypertension: -Group verbal and written instruction that provides a basic overview of hypertension including the most recent diagnostic guidelines, risk factor reduction with self-care instructions and medication management. Flowsheet Row CARDIAC REHAB PHASE II EXERCISE from 01/23/2018 in [place]  Date  01/02/18  Educator  RN  Instruction Review Code  2- Demonstrated Understanding        Nutrition I class: Heart Healthy Eating:  -Group instruction provided by PowerPoint slides, verbal discussion, and written materials to support subject matter. The instructor gives an explanation and review of the Therapeutic Lifestyle Changes diet recommendations, which includes a discussion on lipid goals, dietary fat, sodium, fiber, plant stanol/sterol esters, sugar, and the components of a well-balanced, healthy diet.   Nutrition II class: Lifestyle Skills:  -Group instruction provided by PowerPoint slides, verbal discussion, and written materials to support subject matter. The instructor gives an explanation and review of label reading, grocery shopping for heart health, heart healthy recipe modifications, and ways to make healthier choices when eating out.   Diabetes Question & Answer:  -Group instruction provided by PowerPoint slides, verbal discussion, and written materials to support subject matter. The instructor gives an explanation and review of diabetes co-morbidities, pre- and post-prandial blood glucose goals, pre-exercise blood glucose goals, signs, symptoms, and treatment of hypoglycemia  and hyperglycemia, and foot care basics.   Diabetes Blitz:  -Group instruction provided by PowerPoint slides, verbal discussion, and written materials to support subject matter. The instructor gives an explanation and review of the physiology behind type 1 and type 2 diabetes, diabetes medications and rational behind using different medications, pre- and post-prandial blood glucose recommendations and Hemoglobin A1c goals, diabetes diet, and exercise including blood glucose guidelines for exercising safely.    Portion Distortion:  -Group instruction provided by PowerPoint slides, verbal discussion, written materials, and food models to support subject matter. The instructor gives an explanation of serving size versus portion size, changes in portions sizes over the last 20 years, and what  consists of a serving from each food group.   Stress Management:  -Group instruction provided by verbal instruction, video, and written materials to support subject matter.  Instructors review role of stress in heart disease and how to cope with stress positively.   Flowsheet Row CARDIAC REHAB PHASE II EXERCISE from 01/23/2018 in [place]  Date  12/19/17  Educator  RN  Instruction Review Code  2- Demonstrated Understanding      Exercising on Your Own:  -Group instruction provided by verbal instruction, power point, and written materials to support subject.  Instructors discuss benefits of exercise, components of exercise, frequency and intensity of exercise, and end points for exercise.  Also discuss use of nitroglycerin and activating EMS.  Review options of places to exercise outside of rehab.  Review guidelines for sex with heart disease.   Cardiac Drugs I:  -Group instruction provided by verbal instruction and written materials to support subject.  Instructor reviews cardiac drug classes: antiplatelets, anticoagulants, beta blockers, and statins.  Instructor discusses reasons, side effects, and lifestyle considerations for each drug class. Flowsheet Row CARDIAC REHAB PHASE II EXERCISE from 01/23/2018 in [place]  Date  12/26/17  Educator  Pharmacist  Instruction Review Code  2- Demonstrated Understanding      Cardiac Drugs II:  -Group instruction provided by verbal instruction and written materials to support subject.  Instructor reviews cardiac drug classes: angiotensin converting enzyme inhibitors (ACE-I), angiotensin II receptor blockers (ARBs), nitrates, and calcium channel blockers.  Instructor discusses reasons, side effects, and lifestyle considerations for each drug class.   Anatomy and Physiology of the Circulatory System:  Group verbal and written instruction and models provide basic cardiac anatomy and  physiology, with the coronary electrical and arterial systems. Review of: AMI, Angina, Valve disease, Heart Failure, Peripheral Artery Disease, Cardiac Arrhythmia, Pacemakers, and the ICD. Flowsheet Row CARDIAC REHAB PHASE II EXERCISE from 01/23/2018 in [place]  Date  11/07/17  Instruction Review Code  2- Demonstrated Understanding      Other Education:  -Group or individual verbal, written, or video instructions that support the educational goals of the cardiac rehab program.   Holiday Eating Survival Tips:  -Group instruction provided by PowerPoint slides, verbal discussion, and written materials to support subject matter. The instructor gives patients tips, tricks, and techniques to help them not only survive but enjoy the holidays despite the onslaught of food that accompanies the holidays.   Knowledge Questionnaire Score: Knowledge Questionnaire Score - 01/18/18 1019    Knowledge Questionnaire Score          Post Score  22/24           Core Components/Risk Factors/Patient Goals at Admission: Personal Goals and Risk Factors at Admission -  10/25/17 1143    Core Components/Risk Factors/Patient Goals on Admission           Weight Management  Yes;Weight Loss;Obesity    Intervention  Weight Management: Develop a combined nutrition and exercise program designed to reach desired caloric intake, while maintaining appropriate intake of nutrient and fiber, sodium and fats, and appropriate energy expenditure required for the weight goal.;Weight Management: Provide education and appropriate resources to help participant work on and attain dietary goals.;Weight Management/Obesity: Establish reasonable short term and long term weight goals.;Obesity: Provide education and appropriate resources to help participant work on and attain dietary goals.    Admit Weight  205 lb 14.6 oz (93.4 kg)    Goal Weight: Short Term  200 lb (90.7 kg)    Goal Weight: Long Term   190 lb (86.2 kg)    Expected Outcomes  Short Term: Continue to assess and modify interventions until short term weight is achieved;Long Term: Adherence to nutrition and physical activity/exercise program aimed toward attainment of established weight goal;Weight Loss: Understanding of general recommendations for a balanced deficit meal plan, which promotes 1-2 lb weight loss per week and includes a negative energy balance of [phone] kcal/d;Understanding recommendations for meals to include 15-35% energy as protein, 25-35% energy from fat, 35-60% energy from carbohydrates, less than 217m of dietary cholesterol, 20-35 gm of total fiber daily;Understanding of distribution of calorie intake throughout the day with the consumption of 4-5 meals/snacks    Diabetes  Yes    Intervention  Provide education about signs/symptoms and action to take for hypo/hyperglycemia.;Provide education about proper nutrition, including hydration, and aerobic/resistive exercise prescription along with prescribed medications to achieve blood glucose in normal ranges: Fasting glucose 65-99 mg/dL    Expected Outcomes  Long Term: Attainment of HbA1C < 7%.;Short Term: Participant verbalizes understanding of the signs/symptoms and immediate care of hyper/hypoglycemia, proper foot care and importance of medication, aerobic/resistive exercise and nutrition plan for blood glucose control.    Hypertension  Yes    Intervention  Provide education on lifestyle modifcations including regular physical activity/exercise, weight management, moderate sodium restriction and increased consumption of fresh fruit, vegetables, and low fat dairy, alcohol moderation, and smoking cessation.;Monitor prescription use compliance.    Expected Outcomes  Short Term: Continued assessment and intervention until BP is < 140/974mHG in hypertensive participants. < 130/8012mG in hypertensive participants with diabetes, heart failure or chronic kidney disease.;Long Term:  Maintenance of blood pressure at goal levels.    Lipids  Yes    Intervention  Provide education and support for participant on nutrition & aerobic/resistive exercise along with prescribed medications to achieve LDL <50m50mDL >40mg31m Expected Outcomes  Short Term: Participant states understanding of desired cholesterol values and is compliant with medications prescribed. Participant is following exercise prescription and nutrition guidelines.;Long Term: Cholesterol controlled with medications as prescribed, with individualized exercise RX and with personalized nutrition plan. Value goals: LDL < 50mg,52m > 40 mg.           Core Components/Risk Factors/Patient Goals Review:  Goals and Risk Factor Review    Core Components/Risk Factors/Patient Goals Review    Row Name 11/05/17 1509 12/13/17 0748 12/21/17 1204 01/18/18 0938   Personal Goals Review  Weight Management/Obesity;Diabetes;Hypertension;Lipids  Weight Management/Obesity;Diabetes;Hypertension;Lipids  Weight Management/Obesity;Diabetes;Hypertension;Lipids  Weight Management/Obesity;Diabetes;Hypertension;Lipids   Review  pt with multiple CAD RF demonstrates eagerness to participate in CR program. pt personal goal is to resume previous activities including riding his [employer]. pt is following  MD recommendation and is not riding.  pt recent nosebleed hsa further decreased his strength/stamina.  pt eager to regain energy.   pt with multiple CAD RF demonstrates eagerness to participate in CR program. pt personal goal is to resume previous activities including riding his [employer] motorcycle. pt is enjoying outside activities wtih increaed strength and endurance., pt is walking 1.25 miles at home.    pt with multiple CAD RF demonstrates eagerness to participate in CR program. pt personal goal is to resume previous activities including riding his [employer] motorcycle. pt is enjoying outside activities wtih increaed strength and endurance., pt  is walking 1.25 miles at home. pt notes his wife is closely monitoring his dietary modifications. pt wife invited to attend nutrition education classes.     pt with multiple CAD RF demonstrates eagerness to participate in CR program. pt personal goal is to resume previous activities including riding his [employer] motorcycle. pt is enjoying outside activities wtih increaed strength and endurance., pt is walking 1.25 miles at home.  pt notes recent EP consult for tachypalpitations unremarkable, medication management at this time.    Expected Outcomes  pt will participate in CR exercise, nutrition and lifestyle modification opportunities to reduce overall RF.   pt will participate in CR exercise, nutrition and lifestyle modification opportunities to reduce overall RF.   pt will participate in CR exercise, nutrition and lifestyle modification opportunities to reduce overall RF.   pt will participate in CR exercise, nutrition and lifestyle modification opportunities to reduce overall RF.           Core Components/Risk Factors/Patient Goals at Discharge (Final Review):  Goals and Risk Factor Review - 01/18/18 0938    Core Components/Risk Factors/Patient Goals Review          Personal Goals Review  Weight Management/Obesity;Diabetes;Hypertension;Lipids    Review  pt with multiple CAD RF demonstrates eagerness to participate in CR program. pt personal goal is to resume previous activities including riding his [employer] motorcycle. pt is enjoying outside activities wtih increaed strength and endurance., pt is walking 1.25 miles at home.  pt notes recent EP consult for tachypalpitations unremarkable, medication management at this time.     Expected Outcomes  pt will participate in CR exercise, nutrition and lifestyle modification opportunities to reduce overall RF.            ITP Comments: ITP Comments    Row Name 10/25/17 1135 11/05/17 1213 11/15/17 1515 12/13/17 0747 01/03/18 0729   ITP Comments  Dr.  Fransico Him, Medical Director   pt started group exercise today. pt tolerated light activity without difficulty. pt oriented to exercise equipment and safety routine.   30 day ITP review.    30 day ITP review.  pt with good attendance and participation  30 day ITP review.  pt with good attendance and participation   [place] Name 01/18/18 0938   ITP Comments  30 day ITP review.  pt with good attendance and participation      Comments:

## 2018-01-25 ENCOUNTER — Ambulatory Visit (HOSPITAL_COMMUNITY): Payer: Self-pay

## 2018-01-25 ENCOUNTER — Encounter (HOSPITAL_COMMUNITY)
Admission: RE | Admit: 2018-01-25 | Discharge: 2018-01-25 | Disposition: A | Discharge status: Home / Self Care | Payer: PPO | Source: Ambulatory Visit | Attending: Cardiology | Admitting: Cardiology

## 2018-01-25 DIAGNOSIS — Z951 Presence of aortocoronary bypass graft: Secondary | ICD-10-CM | POA: Diagnosis not present

## 2018-01-28 ENCOUNTER — Ambulatory Visit (HOSPITAL_COMMUNITY): Payer: Self-pay

## 2018-01-28 ENCOUNTER — Encounter (HOSPITAL_COMMUNITY): Payer: PPO

## 2018-01-30 ENCOUNTER — Encounter (HOSPITAL_COMMUNITY)
Admission: RE | Admit: 2018-01-30 | Discharge: 2018-01-30 | Disposition: A | Discharge status: Home / Self Care | Payer: PPO | Source: Ambulatory Visit | Attending: Cardiology | Admitting: Cardiology

## 2018-01-30 ENCOUNTER — Ambulatory Visit (HOSPITAL_COMMUNITY): Payer: Self-pay

## 2018-01-30 DIAGNOSIS — Z951 Presence of aortocoronary bypass graft: Secondary | ICD-10-CM | POA: Diagnosis not present

## 2018-02-01 ENCOUNTER — Encounter (HOSPITAL_COMMUNITY)
Admission: RE | Admit: 2018-02-01 | Discharge: 2018-02-01 | Disposition: A | Discharge status: Home / Self Care | Payer: PPO | Source: Ambulatory Visit | Attending: Cardiology | Admitting: Cardiology

## 2018-02-01 ENCOUNTER — Ambulatory Visit (HOSPITAL_COMMUNITY): Payer: Self-pay

## 2018-02-01 ENCOUNTER — Encounter (HOSPITAL_COMMUNITY): Payer: Self-pay

## 2018-02-01 DIAGNOSIS — Z951 Presence of aortocoronary bypass graft: Secondary | ICD-10-CM | POA: Diagnosis not present

## 2018-02-04 ENCOUNTER — Encounter (HOSPITAL_COMMUNITY): Payer: PPO

## 2018-02-04 ENCOUNTER — Ambulatory Visit (HOSPITAL_COMMUNITY): Payer: Self-pay

## 2018-02-11 DIAGNOSIS — B351 Tinea unguium: Secondary | ICD-10-CM | POA: Diagnosis not present

## 2018-02-11 DIAGNOSIS — D225 Melanocytic nevi of trunk: Secondary | ICD-10-CM | POA: Diagnosis not present

## 2018-02-11 DIAGNOSIS — L570 Actinic keratosis: Secondary | ICD-10-CM | POA: Diagnosis not present

## 2018-02-11 DIAGNOSIS — L821 Other seborrheic keratosis: Secondary | ICD-10-CM | POA: Diagnosis not present

## 2018-02-11 DIAGNOSIS — D1801 Hemangioma of skin and subcutaneous tissue: Secondary | ICD-10-CM | POA: Diagnosis not present

## 2018-02-11 DIAGNOSIS — L819 Disorder of pigmentation, unspecified: Secondary | ICD-10-CM | POA: Diagnosis not present

## 2018-02-19 NOTE — Progress Notes (Signed)
Discharge Progress Report  Patient Details  Name: Taylor [name] MRN: [id number] Date of Birth: Jun 13, 1950 Referring Provider:   Flowsheet Row CARDIAC REHAB PHASE II ORIENTATION from 10/25/2017 in [facility]  Referring Provider  Leonie Man MD        Number of Visits: 29  Reason for Discharge:  Patient independent in their exercise.  Smoking History:  Social History   Tobacco Use  Smoking Status Never Smoker  Smokeless Tobacco Never Used    Diagnosis:  S/P CABG x 4 09/06/17  ADL [facility]:   Initial Exercise Prescription: Initial Exercise Prescription - 10/25/17 1100    Date of Initial Exercise RX and Referring Provider          Date  10/25/17    Referring Provider  Leonie Man MD     Expected Discharge Date  01/18/18        Treadmill          MPH  3    Grade  1    Minutes  10    METs  3.71        Bike          Level  1.2    Minutes  10    METs  3.38        NuStep          Level  2    SPM  75    Minutes  10    METs  2.8        Prescription Details          Frequency (times per week)  3x    Duration  Progress to 30 minutes of continuous aerobic without signs/symptoms of physical distress        Intensity          THRR 40-80% of Max Heartrate  61-122    Ratings of Perceived Exertion  11-13    Perceived Dyspnea  0-4        Progression          Progression  Continue progressive overload as per policy without signs/symptoms or physical distress.        Resistance Training          Training Prescription  Yes    Weight  3lbs    Reps  10-15           Discharge Exercise Prescription (Final Exercise Prescription Changes): Exercise Prescription Changes - 02/01/18 1500    Response to Exercise          Blood Pressure (Admit)  112/66    Blood Pressure (Exercise)  130/80    Blood Pressure (Exit)  124/68    Heart Rate (Admit)  64 bpm    Heart Rate (Exercise)  122 bpm    Heart Rate (Exit)  64 bpm     Rating of Perceived Exertion (Exercise)  12    Perceived Dyspnea (Exercise)  0    Symptoms  None    Duration  Continue with 30 min of aerobic exercise without signs/symptoms of physical distress.    Intensity  THRR unchanged        Progression          Progression  Continue to progress workloads to maintain intensity without signs/symptoms of physical distress.    Average METs  4.8        Resistance Training          Training Prescription  Yes  Weight  6 lbs.    Reps  10-15    Time  10 Minutes        Interval Training          Interval Training  No        Treadmill          MPH  3.4    Grade  2    Minutes  10    METs  4.54        Bike          Level  2.1    Minutes  10    METs  5.36        NuStep          Level  4    SPM  95    Minutes  10    METs  4.6        Home Exercise Plan          Plans to continue exercise at  Home (comment)   Walking   Frequency  Add 3 additional days to program exercise sessions.    Initial Home Exercises Provided  11/23/17           Functional Capacity: 6 Minute Walk    6 Minute Walk    Row Name 10/25/17 1144 01/14/18 0944   Phase  Initial  Discharge   Distance  1710 feet  2000 feet   Distance % Change  no documentation  16.96 %   Distance Feet Change  no documentation  290 ft   Walk Time  6 minutes  6 minutes   # of Rest Breaks  0  0   MPH  3.24  3.78   METS  3.77  4.26   RPE  11  11   Perceived Dyspnea   0  no documentation   VO2 Peak  13.2  14.93   Symptoms  No  no documentation   Resting HR  63 bpm  65 bpm   Resting BP  110/70  118/68   Resting Oxygen Saturation   97 %  no documentation   Exercise Oxygen Saturation  during 6 min walk  97 %  no documentation   Max Ex. HR  89 bpm  103 bpm   Max Ex. BP  148/60  140/80   2 Minute Post BP  124/72  116/78          Psychological, QOL, Others - Outcomes: PHQ 2/9: Depression screen PHQ 2/9 11/05/2017  Decreased Interest 0  Down, Depressed, Hopeless 0   PHQ - 2 Score 0    Quality of Life: Quality of Life - 01/18/18 1019    Quality of Life          Select  Quality of Life        Quality of Life Scores          Health/Function Post  27.14 %    Socioeconomic Post  25 %    Psych/Spiritual Post  26.29 %    Family Post  28.8 %    GLOBAL Post  26.76 %           Personal Goals: Goals established at orientation with interventions provided to work toward goal. Personal Goals and Risk Factors at Admission - 10/25/17 1143    Core Components/Risk Factors/Patient Goals on Admission           Weight Management  Yes;Weight Loss;Obesity    Intervention  Weight Management: Develop  a combined nutrition and exercise program designed to reach desired caloric intake, while maintaining appropriate intake of nutrient and fiber, sodium and fats, and appropriate energy expenditure required for the weight goal.;Weight Management: Provide education and appropriate resources to help participant work on and attain dietary goals.;Weight Management/Obesity: Establish reasonable short term and long term weight goals.;Obesity: Provide education and appropriate resources to help participant work on and attain dietary goals.    Admit Weight  205 lb 14.6 oz (93.4 kg)    Goal Weight: Short Term  200 lb (90.7 kg)    Goal Weight: Long Term  190 lb (86.2 kg)    Expected Outcomes  Short Term: Continue to assess and modify interventions until short term weight is achieved;Long Term: Adherence to nutrition and physical activity/exercise program aimed toward attainment of established weight goal;Weight Loss: Understanding of general recommendations for a balanced deficit meal plan, which promotes 1-2 lb weight loss per week and includes a negative energy balance of [phone] kcal/d;Understanding recommendations for meals to include 15-35% energy as protein, 25-35% energy from fat, 35-60% energy from carbohydrates, less than 240m of dietary cholesterol, 20-35 gm of total fiber  daily;Understanding of distribution of calorie intake throughout the day with the consumption of 4-5 meals/snacks    Diabetes  Yes    Intervention  Provide education about signs/symptoms and action to take for hypo/hyperglycemia.;Provide education about proper nutrition, including hydration, and aerobic/resistive exercise prescription along with prescribed medications to achieve blood glucose in normal ranges: Fasting glucose 65-99 mg/dL    Expected Outcomes  Long Term: Attainment of HbA1C < 7%.;Short Term: Participant verbalizes understanding of the signs/symptoms and immediate care of hyper/hypoglycemia, proper foot care and importance of medication, aerobic/resistive exercise and nutrition plan for blood glucose control.    Hypertension  Yes    Intervention  Provide education on lifestyle modifcations including regular physical activity/exercise, weight management, moderate sodium restriction and increased consumption of fresh fruit, vegetables, and low fat dairy, alcohol moderation, and smoking cessation.;Monitor prescription use compliance.    Expected Outcomes  Short Term: Continued assessment and intervention until BP is < 140/928mHG in hypertensive participants. < 130/8061mG in hypertensive participants with diabetes, heart failure or chronic kidney disease.;Long Term: Maintenance of blood pressure at goal levels.    Lipids  Yes    Intervention  Provide education and support for participant on nutrition & aerobic/resistive exercise along with prescribed medications to achieve LDL <86m51mDL >40mg31m Expected Outcomes  Short Term: Participant states understanding of desired cholesterol values and is compliant with medications prescribed. Participant is following exercise prescription and nutrition guidelines.;Long Term: Cholesterol controlled with medications as prescribed, with individualized exercise RX and with personalized nutrition plan. Value goals: LDL < 86mg,62m > 40 mg.             Personal Goals Discharge: Goals and Risk Factor Review    Core Components/Risk Factors/Patient Goals Review    Row Name 11/05/17 1509 12/13/17 0748 12/21/17 1204 01/18/18 0938 02/01/18 1017   Personal Goals Review  Weight Management/Obesity;Diabetes;Hypertension;Lipids  Weight Management/Obesity;Diabetes;Hypertension;Lipids  Weight Management/Obesity;Diabetes;Hypertension;Lipids  Weight Management/Obesity;Diabetes;Hypertension;Lipids  Weight Management/Obesity;Diabetes;Hypertension;Lipids   Review  pt with multiple CAD RF demonstrates eagerness to participate in CR program. pt personal goal is to resume previous activities including riding his [employer]. pt is following MD recommendation and is not riding.  pt recent nosebleed hsa further decreased his strength/stamina.  pt eager to regain energy.   pt with multiple CAD RF demonstrates eagerness to  participate in CR program. pt personal goal is to resume previous activities including riding his [employer] motorcycle. pt is enjoying outside activities wtih increaed strength and endurance., pt is walking 1.25 miles at home.    pt with multiple CAD RF demonstrates eagerness to participate in CR program. pt personal goal is to resume previous activities including riding his [employer] motorcycle. pt is enjoying outside activities wtih increaed strength and endurance., pt is walking 1.25 miles at home. pt notes his wife is closely monitoring his dietary modifications. pt wife invited to attend nutrition education classes.     pt with multiple CAD RF demonstrates eagerness to participate in CR program. pt personal goal is to resume previous activities including riding his [employer] motorcycle. pt is enjoying outside activities wtih increaed strength and endurance., pt is walking 1.25 miles at home.  pt notes recent EP consult for tachypalpitations unremarkable, medication management at this time.   pt with multiple CAD RF demonstrates eagerness to  participate in CR program. pt personal goal is to resume previous activities including riding his [employer] motorcycle. pt is enjoying outside activities wtih increaed strength and endurance., pt is walking 1.25 miles at home.  pt plans to continue walking at home and will start going to gym.  pt pleased with his increased strength/stamina and endurance.     Expected Outcomes  pt will participate in CR exercise, nutrition and lifestyle modification opportunities to reduce overall RF.   pt will participate in CR exercise, nutrition and lifestyle modification opportunities to reduce overall RF.   pt will participate in CR exercise, nutrition and lifestyle modification opportunities to reduce overall RF.   pt will participate in CR exercise, nutrition and lifestyle modification opportunities to reduce overall RF.   pt will participate in CR exercise, nutrition and lifestyle modification opportunities to reduce overall RF.           Exercise Goals and Review: Exercise Goals    Exercise Goals    Row Name 10/25/17 1145   Increase Physical Activity  Yes   Intervention  Develop an individualized exercise prescription for aerobic and resistive training based on initial evaluation findings, risk stratification, comorbidities and participant's personal goals.;Provide advice, education, support and counseling about physical activity/exercise needs.   Expected Outcomes  Short Term: Attend rehab on a regular basis to increase amount of physical activity.;Long Term: Add in home exercise to make exercise part of routine and to increase amount of physical activity.;Long Term: Exercising regularly at least 3-5 days a week.   Increase Strength and Stamina  Yes   Intervention  Develop an individualized exercise prescription for aerobic and resistive training based on initial evaluation findings, risk stratification, comorbidities and participant's personal goals.;Provide advice, education, support and counseling about  physical activity/exercise needs.   Expected Outcomes  Short Term: Increase workloads from initial exercise prescription for resistance, speed, and METs.;Short Term: Perform resistance training exercises routinely during rehab and add in resistance training at home;Long Term: Improve cardiorespiratory fitness, muscular endurance and strength as measured by increased METs and functional capacity (6MWT)   Able to understand and use rate of perceived exertion (RPE) scale  Yes   Intervention  Provide education and explanation on how to use RPE scale   Expected Outcomes  Short Term: Able to use RPE daily in rehab to express subjective intensity level;Long Term:  Able to use RPE to guide intensity level when exercising independently   Knowledge and understanding of Target Heart Rate Range (THRR)  Yes  Intervention  Provide education and explanation of THRR including how the numbers were predicted and where they are located for reference   Expected Outcomes  Short Term: Able to state/look up THRR;Long Term: Able to use THRR to govern intensity when exercising independently;Short Term: Able to use daily as guideline for intensity in rehab   Able to check pulse independently  Yes   Intervention  Review the importance of being able to check your own pulse for safety during independent exercise;Provide education and demonstration on how to check pulse in carotid and radial arteries.   Expected Outcomes  Short Term: Able to explain why pulse checking is important during independent exercise;Long Term: Able to check pulse independently and accurately   Understanding of Exercise Prescription  Yes   Intervention  Provide education, explanation, and written materials on patient's individual exercise prescription   Expected Outcomes  Short Term: Able to explain program exercise prescription;Long Term: Able to explain home exercise prescription to exercise independently   Improve claudication pain toleration; Improve  walking ability  Yes   Intervention  Participate in PAD/SET Rehab 2-3 days a week, walking at home as part of exercise prescription;Attend education sessions to aid in risk factor modification and understanding of disease process   Expected Outcomes  Short Term: Improve walking distance/time to onset of claudication pain;Long Term: Improve score of PAD questionnaires;Long Term: Improve walking ability and toleration to claudication          Exercise Goals Re-Evaluation: Exercise Goals Re-Evaluation    Exercise Goal Re-Evaluation    Row Name 11/23/17 1105 12/12/17 1459 01/02/18 0920 01/22/18 1417 02/01/18 1548   Exercise Goals Review  Increase Physical Activity;Able to understand and use rate of perceived exertion (RPE) scale;Knowledge and understanding of Target Heart Rate Range (THRR);Understanding of Exercise Prescription;Increase Strength and Stamina;Able to check pulse independently  Increase Physical Activity;Able to understand and use rate of perceived exertion (RPE) scale;Knowledge and understanding of Target Heart Rate Range (THRR);Understanding of Exercise Prescription;Increase Strength and Stamina;Able to check pulse independently  Increase Physical Activity;Increase Strength and Stamina;Able to understand and use rate of perceived exertion (RPE) scale;Knowledge and understanding of Target Heart Rate Range (THRR);Understanding of Exercise Prescription;Able to check pulse independently  Increase Physical Activity;Increase Strength and Stamina;Able to check pulse independently;Understanding of Exercise Prescription;Knowledge and understanding of Target Heart Rate Range (THRR);Able to understand and use rate of perceived exertion (RPE) scale  Increase Physical Activity;Increase Strength and Stamina;Able to check pulse independently;Able to understand and use rate of perceived exertion (RPE) scale;Knowledge and understanding of Target Heart Rate Range (THRR);Understanding of Exercise Prescription    Comments  Reviewed HEP with patient also reviewed THRR, NTG use, RPE scale, end points of exercise, and weather conditions.   Reviewed METs and goal with Pt. MET level has increased to 3.92. Continues to progress and tolerate exercise well.   Reviewed METs and goals with Pt. Pt continues to increase MET level. Has increased to 4.62. Continues to exercise at home by walking 1.5 miles 4 times per week.   Reviewed METs and goals with Pt. Pt continues to increase MET level. Has increased to 4.6. Continues to exercise at home by walking 1.5 miles 4 times per week.   Pt graduated Cardiac Rehab today. MET level reached 4.8. Tolerated exercise Rx well. Plans to continue exericse.    Expected Outcomes  Patient will continue to walk 4 days a week for 45- 60 minutes per day. Patient will continue to increase stamina. Will continue to monitor  and progress patient as tolerated.   Will continue to progress and monitor Pt.   Will continue to monitor and progress Pt as tolerated.   Will continue to monitor and progress Pt as tolerated.   Plans to exercise with [employer] at [facility].           Nutrition & Weight - Outcomes: Pre Biometrics - 10/25/17 1145    Pre Biometrics          Height  5' 11"  (1.803 m)    Weight  93.4 kg    Waist Circumference  42 inches    Hip Circumference  40 inches    Waist to Hip Ratio  1.05 %    BMI (Calculated)  28.73    Triceps Skinfold  16 mm    % Body Fat  28.8 %    Grip Strength  43 kg    Flexibility  10.5 in    Single Leg Stand  2.47 seconds          Post Biometrics - 01/14/18 0945     Post  Biometrics          Height  5' 11"  (1.803 m)    Weight  98.4 kg    Waist Circumference  43 inches    Hip Circumference  42.5 inches    Waist to Hip Ratio  1.01 %    BMI (Calculated)  30.27    Triceps Skinfold  20 mm    % Body Fat  30.7 %    Grip Strength  42 kg    Flexibility  8.5 in    Single Leg Stand  4.18 seconds           Nutrition: Nutrition Therapy &  Goals - 02/08/18 1353    Nutrition Therapy          Diet  diabetic, heart healthy        Personal Nutrition Goals          Nutrition Goal  Pt to identify and limit food sources of saturated fat, trans fat, refined carbohydrates and sodium    Personal Goal #2  Improved blood glucose control as evidenced by pt's A1c trending from 7.6 toward less than 7.0.    Personal Goal #3  Pt able to name foods that affect blood glucose   nutrition goal met       Intervention Plan          Intervention  Prescribe, educate and counsel regarding individualized specific dietary modifications aiming towards targeted core components such as weight, hypertension, lipid management, diabetes, heart failure and other comorbidities.    Expected Outcomes  Short Term Goal: Understand basic principles of dietary content, such as calories, fat, sodium, cholesterol and nutrients.;Long Term Goal: Adherence to prescribed nutrition plan.           Nutrition Discharge: Nutrition Assessments - 02/08/18 1351    MEDFICTS Scores          Pre Score  32    Post Score  21    Score Difference  -11           Education Questionnaire Score: Knowledge Questionnaire Score - 01/18/18 1019    Knowledge Questionnaire Score          Post Score  22/24           Goals reviewed with patient; copy given to patient.

## 2018-03-14 DIAGNOSIS — L578 Other skin changes due to chronic exposure to nonionizing radiation: Secondary | ICD-10-CM | POA: Diagnosis not present

## 2018-03-14 DIAGNOSIS — L821 Other seborrheic keratosis: Secondary | ICD-10-CM | POA: Diagnosis not present

## 2018-04-04 DIAGNOSIS — N201 Calculus of ureter: Secondary | ICD-10-CM | POA: Diagnosis not present

## 2018-04-16 ENCOUNTER — Encounter: Payer: Self-pay | Admitting: Cardiology

## 2018-04-16 ENCOUNTER — Ambulatory Visit (INDEPENDENT_AMBULATORY_CARE_PROVIDER_SITE_OTHER): Payer: PPO | Admitting: Cardiology

## 2018-04-16 VITALS — BP 132/76 | HR 52 | Ht 71.0 in | Wt 224.0 lb

## 2018-04-16 DIAGNOSIS — I471 Supraventricular tachycardia: Secondary | ICD-10-CM

## 2018-04-16 NOTE — Progress Notes (Signed)
Electrophysiology Office Note   Date:  04/16/2018   ID:  Taylor [name], DOB 1950-05-20, MRN [id number]  PCP:  Haywood Pao, MD  Cardiologist:  Ellyn Hack Primary Electrophysiologist:  Coen Miyasato Meredith Leeds, MD    No chief complaint on file.    History of Present Illness: Taylor [name] is a 68 y.o. male who is being seen today for the evaluation of PVCs, SVT at the request of [name]. Presenting today for electrophysiology evaluation.  He has a history of coronary disease status post CABG, PVCs with ventricular bigeminy, and SVT.  He has had a few episodes of palpitations.  His first episode was October 22 when he went to the emergency room.  He was at work, felt his heart rate with associated chest tightness and his blood pressure of 170/110.  His heart rate was 160 bpm.  He was also mildly short of breath.  In the emergency room, his heart rate had returned back to normal.  2 days later, he noted his blood pressure and heart rate went up.  This lasted 20 to 30 minutes.  Heart rates were in the 160s.  It was thought that he had atrial fibrillation and thus he was put on anticoagulation.  He he developed epistaxis and had an ENT procedure.  He 5 mg Toprol-XL twice a day.  He has had one short episode of SVT since that time.  Today, denies symptoms of palpitations, chest pain, shortness of breath, orthopnea, PND, lower extremity edema, claudication, dizziness, presyncope, syncope, bleeding, or neurologic sequela. The patient is tolerating medications without difficulties.  Since last being seen he is done quite well.  He has had only minimal palpitations lasting up to a minute and a half.  This is occurred 3 times.  He is very content with his current medication regimen.   Past Medical History:  Diagnosis Date  . Bigeminal rhythm 08/01/2017   per EKG 07-24-2017 in epic (per confirmation new since last ecg in 2007)  in care everywhere in epic ecg dated 2012 shows sinus brady rate 53  no pvc's --per pt asymptomatic and   . Bradycardia   . Coronary artery disease involving native coronary artery 08/2017   Abnormal Myoview 08/2017 (Abnormal EKG - Severe ST depressions, PVCs & nsVT). TID noted.  Moderate LAD territory Ischemic Perfusion defect --> Cath with extensive RCA (RPDA & RPAV), Cx & LAD disease --> CABG x 4  . Family history of premature coronary artery disease    Father - MI 30. Paternal uncle x 2 -1 with MI & other with CABG in 26s  . History of contusion    ED visit 08-04-2013 , 9 days past injury ,  dx concussion w/ LOC, per CT inferior frontal lobes contusion hemarrhage-- residual headache (08-01-2017  per pt residual resolved)  . History of kidney stones   . Hypercholesterolemia   . Hypertension   . Nephrolithiasis    bilateral nonobstructive per CT 07-24-2017  . S/P CABG x 4 09/06/2017   LIMA to LAD, SVG to OM2, Sequential SVG to PDA and RPL2, EVH via right thigh and leg  . Type 2 diabetes mellitus ([place])    followed by pcp  . Ureteral calculi    left  . Wears glasses   . Wears hearing aid in both ears    Past Surgical History:  Procedure Laterality Date  . CORONARY ARTERY BYPASS GRAFT N/A 09/06/2017   Procedure: CORONARY ARTERY BYPASS GRAFTING (CABG) x 4,  using left internal mammary artery and right leg greater saphenous vein harvested endoscopically;  Surgeon: Rexene Alberts, MD;  Location: [facility] OR;  Service: Open Heart Surgery;; LIMA-LAD, SVG-OM2, SeqSVG-RPDA-RPL2  . CYSTOSCOPY/RETROGRADE/URETEROSCOPY Left 08/06/2017   Procedure: CYSTOSCOPY/RETROGRADE/URETEROSCOPY laser and stone basket extrection;  Surgeon: Cleon Gustin, MD;  Location: [facility];  Service: Urology;  Laterality: Left;  . LAPAROSCOPIC APPENDECTOMY  11-18-2005    dr wyatt  [facility]  . LEFT HEART CATH AND CORONARY ANGIOGRAPHY N/A 09/03/2017   Procedure: LEFT HEART CATH AND CORONARY ANGIOGRAPHY;  Surgeon: Leonie Man, MD;  Location: [facility];  Service:  Cardiovascular;; mRCA 70%, dRCA 99%-95% into RPAV & 90% ostRPDA 95%. RPL1 70%, distal RVAM (tandem PDA) - 85%. Ost-prox LAD 40% -pLAD 60&90% @ D1 ->p-mLAD 95% @ D2 with 70% D2. pCx 80%, p-mCx 90%-> mCx 85%.  EF 45-50%, global HK. -->> referred for CABG  . NASAL ENDOSCOPY WITH EPISTAXIS CONTROL N/A 11/29/2017   Procedure: NASAL ENDOSCOPY WITH EPISTAXIS CONTROL;  Surgeon: Leta Baptist, MD;  Location: [place];  Service: ENT;  Laterality: N/A;  . NM MYOVIEW LTD  08/31/2017   HIGH RISK: Combination Imaging & EKG:  with  in tmedium sized, moderate severity Reversible perfusion e apdefect in  the mid and apical anterior, apical lateral walls - FINDINGS ARE C/W ISCHEMIA in the LAD territory,  however given significant arrhythmias, severe ST depressions and TID =  MV CAD is highly likely (SDS =7).   . ORCHIECTOMY  1965   unilateral -- per pt ruptured  . TEE WITHOUT CARDIOVERSION N/A 09/06/2017   Procedure: TRANSESOPHAGEAL ECHOCARDIOGRAM (TEE);  INTRA-OP.  Surgeon: Rexene Alberts, MD;  Location: [facility] OR;  Service: Open Heart Surgery:;; Normal AoV, MV & TV.  Normal RV.  Marland Kitchen TRANSTHORACIC ECHOCARDIOGRAM  09/04/2017   (pre-CABG): EF 40-45%, diffuse HK.  Difficult to assess due to frequent PVCs. Severe LA dilation. Mildly increased PAP.  Marland Kitchen UMBILICAL HERNIA REPAIR  2006  . VARICOSE VEIN SURGERY  2007   left endovascular laser ablation     Current Outpatient Medications  Medication Sig Dispense Refill  . aspirin EC 81 MG tablet Take 1 tablet (81 mg total) by mouth daily. 90 tablet 3  . atorvastatin (LIPITOR) 80 MG tablet Take 1 tablet (80 mg total) by mouth daily at 6 PM. 90 tablet 3  . lisinopril (PRINIVIL,ZESTRIL) 10 MG tablet TAKE 1 TABLET BY MOUTH EVERY DAY 30 tablet 5  . metoprolol succinate (TOPROL XL) 25 MG 24 hr tablet Take 1 tablet (25 mg total) by mouth 2 (two) times daily. 180 tablet 3  . metoprolol tartrate (LOPRESSOR) 25 MG tablet Take 1 tablet (25 mg total) by mouth as needed. TAKE  FOR PROLONG INCREASE HEART RATE OR PALPATIONS 20 tablet 6  . Multiple Vitamin (MULTIVITAMIN) tablet Take 1 tablet by mouth daily.    . sitaGLIPtan-metformin (JANUMET) 50-500 MG per tablet Take 1 tablet by mouth 2 (two) times daily with a meal.      No current facility-administered medications for this visit.     Allergies:   Patient has no known allergies.   Social History:  The patient  reports that he has never smoked. He has never used smokeless tobacco. He reports that he does not drink alcohol or use drugs.   Family History:  The patient's family history includes Atrial fibrillation in his mother; CAD in his paternal uncle; Clotting disorder in his father; Diabetes in his daughter; Heart  attack in his paternal uncle; Heart attack (age of onset: 87) in his father; Heart disease in an other family member; Stroke in his mother and another family member.   ROS:  Please see the history of present illness.   Otherwise, review of systems is positive for palpitations.   All other systems are reviewed and negative.   PHYSICAL EXAM: VS:  BP 132/76   Pulse (!) 52   Ht 5\' 11"  (1.803 m)   Wt 224 lb (101.6 kg)   SpO2 99%   BMI 31.24 kg/m  , BMI Body mass index is 31.24 kg/m. GEN: Well nourished, well developed, in no acute distress  HEENT: normal  Neck: no JVD, carotid bruits, or masses Cardiac: RRR; no murmurs, rubs, or gallops,no edema  Respiratory:  clear to auscultation bilaterally, normal work of breathing GI: soft, nontender, nondistended, + BS MS: no deformity or atrophy  Skin: warm and dry Neuro:  Strength and sensation are intact Psych: euthymic mood, full affect  EKG:  EKG is not ordered today. Personal review of the ekg ordered 01/16/18 shows SR, rate 54, LVH   Recent Labs: 11/21/2017: ALT 22 01/04/2018: BUN 21; Creatinine, Ser 1.41; Hemoglobin 12.3; Magnesium 1.7; Platelets 246; Potassium 4.3; Sodium 139    Lipid Panel  No results found for: CHOL, TRIG, HDL, CHOLHDL,  VLDL, LDLCALC, LDLDIRECT   Wt Readings from Last 3 Encounters:  04/16/18 224 lb (101.6 kg)  01/16/18 221 lb (100.2 kg)  01/14/18 216 lb 14.9 oz (98.4 kg)      Other studies Reviewed: Additional studies/ records that were reviewed today include: TTE 12/01/17  Review of the above records today demonstrates:  - Left ventricle: The cavity size was mildly dilated. Systolic   function was mildly to moderately reduced. The estimated ejection   fraction was in the range of 40% to 45%. Mild diffuse hypokinesis   with no identifiable regional variations. Features are consistent   with a pseudonormal left ventricular filling pattern, with   concomitant abnormal relaxation and increased filling pressure   (grade 2 diastolic dysfunction). Doppler parameters are   consistent with high ventricular filling pressure. - Aortic valve: Trileaflet; normal thickness, moderately calcified   leaflets. - Left atrium: The atrium was severely dilated. - Pulmonic valve: There was mild regurgitation. - Pulmonary arteries: PA peak pressure: 35 mm Hg (S).  Monitor 11/18/17 - personally reviewed  Mostly sinus rhythm. Frequent PVCs (12% burden), rare PACs.  Multiple short runs of 4-5 beats of PVCs/short VT.  Several episodes of short runs SVT with at least 3-4 prolonged runs of SVT (favor SVT over A. fib)  No prolonged ventricular tachycardia.   30-day event monitor shows several episodes of SVT (reported as a fib, but appears to be more related to SVT).  Very high PVC burden of 12%. Short runs of 4-5 beats VT   ASSESSMENT AND PLAN:  1.  PVCs: He has 12% PVCs.  He is minimally symptomatic.  No changes.  2.  SVT: Appears to be due to AVNRT.  He is currently on metoprolol and is happy with his medication regimen.  No changes.  3.  Hypertension: Currently well controlled  4.  Ischemic cardiomyopathy: EF 40 to 45%.  No signs of volume overload  5.  Coronary artery disease status post CABG: No current  chest pain   Current medicines are reviewed at length with the patient today.   The patient does not have concerns regarding his medicines.  The following changes  were made today: None  Labs/ tests ordered today include:  No orders of the defined types were placed in this encounter.   Disposition:   FU with Markiah Janeway 6 months  Signed, Daking Westervelt Meredith Leeds, MD  04/16/2018 10:44 AM     [facility] [address] [place] [address] [phone] (office) [phone] (fax)

## 2018-04-16 NOTE — Patient Instructions (Signed)
Medication Instructions:  Your physician recommends that you continue on your current medications as directed. Please refer to the Current Medication list given to you today.  * If you need a refill on your cardiac medications before your next appointment, please call your pharmacy.   Labwork: None ordered  Testing/Procedures: None ordered  Follow-Up: Your physician wants you to follow-up in: 6 months with Dr. Curt Bears.  You will receive a reminder letter in the mail two months in advance. If you don't receive a letter, please call our office to schedule the follow-up appointment.  Thank you for choosing [facility]!!   Trinidad Curet, RN [phone]      r

## 2018-05-08 ENCOUNTER — Telehealth: Payer: Self-pay | Admitting: *Deleted

## 2018-05-08 NOTE — Telephone Encounter (Signed)
LEFT MESSAGE TO CALL BACK- NEED TO DISCUSS  DOING A VIRTUAL VISIT OR POSTPONING

## 2018-05-08 NOTE — Telephone Encounter (Signed)
Follow Up:; ° ° °Returning your call. °

## 2018-05-08 NOTE — Telephone Encounter (Signed)
New Message   Returning your call please call back!

## 2018-05-09 NOTE — Telephone Encounter (Signed)
   Primary Cardiologist:  [name], MD   Patient contacted.  History reviewed.  No symptoms to suggest any unstable cardiac conditions.  Based on discussion, with current pandemic situation, we will be postponing this appointment for Taylor [name] with a plan for f/u in 6-12 wks or sooner if feasible/necessary.  If symptoms change, he has been instructed to contact our office.   Routing to C19 CANCEL pool for tracking  [name]  05/09/2018 1:31 PM         .

## 2018-05-10 ENCOUNTER — Ambulatory Visit: Payer: PPO | Admitting: Cardiology

## 2018-06-18 DIAGNOSIS — Z125 Encounter for screening for malignant neoplasm of prostate: Secondary | ICD-10-CM | POA: Diagnosis not present

## 2018-06-18 DIAGNOSIS — I130 Hypertensive heart and chronic kidney disease with heart failure and stage 1 through stage 4 chronic kidney disease, or unspecified chronic kidney disease: Secondary | ICD-10-CM | POA: Diagnosis not present

## 2018-06-18 DIAGNOSIS — E7800 Pure hypercholesterolemia, unspecified: Secondary | ICD-10-CM | POA: Diagnosis not present

## 2018-06-18 DIAGNOSIS — E1129 Type 2 diabetes mellitus with other diabetic kidney complication: Secondary | ICD-10-CM | POA: Diagnosis not present

## 2018-06-20 DIAGNOSIS — R82998 Other abnormal findings in urine: Secondary | ICD-10-CM | POA: Diagnosis not present

## 2018-06-20 DIAGNOSIS — E1165 Type 2 diabetes mellitus with hyperglycemia: Secondary | ICD-10-CM | POA: Diagnosis not present

## 2018-06-25 DIAGNOSIS — E1165 Type 2 diabetes mellitus with hyperglycemia: Secondary | ICD-10-CM | POA: Diagnosis not present

## 2018-06-25 DIAGNOSIS — E7800 Pure hypercholesterolemia, unspecified: Secondary | ICD-10-CM | POA: Diagnosis not present

## 2018-06-25 DIAGNOSIS — I130 Hypertensive heart and chronic kidney disease with heart failure and stage 1 through stage 4 chronic kidney disease, or unspecified chronic kidney disease: Secondary | ICD-10-CM | POA: Diagnosis not present

## 2018-06-25 DIAGNOSIS — R809 Proteinuria, unspecified: Secondary | ICD-10-CM | POA: Diagnosis not present

## 2018-06-25 DIAGNOSIS — I255 Ischemic cardiomyopathy: Secondary | ICD-10-CM | POA: Diagnosis not present

## 2018-06-25 DIAGNOSIS — E1129 Type 2 diabetes mellitus with other diabetic kidney complication: Secondary | ICD-10-CM | POA: Diagnosis not present

## 2018-06-25 DIAGNOSIS — Z0000 Encounter for general adult medical examination without abnormal findings: Secondary | ICD-10-CM | POA: Diagnosis not present

## 2018-06-25 DIAGNOSIS — D126 Benign neoplasm of colon, unspecified: Secondary | ICD-10-CM | POA: Diagnosis not present

## 2018-06-25 DIAGNOSIS — D692 Other nonthrombocytopenic purpura: Secondary | ICD-10-CM | POA: Diagnosis not present

## 2018-06-25 DIAGNOSIS — Z1339 Encounter for screening examination for other mental health and behavioral disorders: Secondary | ICD-10-CM | POA: Diagnosis not present

## 2018-06-25 DIAGNOSIS — N183 Chronic kidney disease, stage 3 (moderate): Secondary | ICD-10-CM | POA: Diagnosis not present

## 2018-06-25 DIAGNOSIS — E669 Obesity, unspecified: Secondary | ICD-10-CM | POA: Diagnosis not present

## 2018-06-25 DIAGNOSIS — I471 Supraventricular tachycardia: Secondary | ICD-10-CM | POA: Diagnosis not present

## 2018-06-25 DIAGNOSIS — Z1331 Encounter for screening for depression: Secondary | ICD-10-CM | POA: Diagnosis not present

## 2018-07-16 ENCOUNTER — Emergency Department (HOSPITAL_BASED_OUTPATIENT_CLINIC_OR_DEPARTMENT_OTHER): Payer: PPO

## 2018-07-16 ENCOUNTER — Emergency Department (HOSPITAL_BASED_OUTPATIENT_CLINIC_OR_DEPARTMENT_OTHER)
Admission: EM | Admit: 2018-07-16 | Discharge: 2018-07-16 | Disposition: A | Discharge status: Home / Self Care | Payer: PPO | Attending: Emergency Medicine | Admitting: Emergency Medicine

## 2018-07-16 ENCOUNTER — Other Ambulatory Visit: Payer: Self-pay

## 2018-07-16 ENCOUNTER — Encounter (HOSPITAL_BASED_OUTPATIENT_CLINIC_OR_DEPARTMENT_OTHER): Payer: Self-pay | Admitting: *Deleted

## 2018-07-16 DIAGNOSIS — Z79899 Other long term (current) drug therapy: Secondary | ICD-10-CM | POA: Insufficient documentation

## 2018-07-16 DIAGNOSIS — M545 Low back pain, unspecified: Secondary | ICD-10-CM

## 2018-07-16 DIAGNOSIS — M533 Sacrococcygeal disorders, not elsewhere classified: Secondary | ICD-10-CM | POA: Diagnosis not present

## 2018-07-16 DIAGNOSIS — Y929 Unspecified place or not applicable: Secondary | ICD-10-CM | POA: Diagnosis not present

## 2018-07-16 DIAGNOSIS — S0101XA Laceration without foreign body of scalp, initial encounter: Secondary | ICD-10-CM | POA: Diagnosis not present

## 2018-07-16 DIAGNOSIS — Y939 Activity, unspecified: Secondary | ICD-10-CM | POA: Insufficient documentation

## 2018-07-16 DIAGNOSIS — Z7982 Long term (current) use of aspirin: Secondary | ICD-10-CM | POA: Diagnosis not present

## 2018-07-16 DIAGNOSIS — I10 Essential (primary) hypertension: Secondary | ICD-10-CM | POA: Diagnosis not present

## 2018-07-16 DIAGNOSIS — Z951 Presence of aortocoronary bypass graft: Secondary | ICD-10-CM | POA: Diagnosis not present

## 2018-07-16 DIAGNOSIS — I2510 Atherosclerotic heart disease of native coronary artery without angina pectoris: Secondary | ICD-10-CM | POA: Diagnosis not present

## 2018-07-16 DIAGNOSIS — Y999 Unspecified external cause status: Secondary | ICD-10-CM | POA: Diagnosis not present

## 2018-07-16 DIAGNOSIS — S0990XA Unspecified injury of head, initial encounter: Secondary | ICD-10-CM | POA: Diagnosis present

## 2018-07-16 DIAGNOSIS — Z23 Encounter for immunization: Secondary | ICD-10-CM | POA: Insufficient documentation

## 2018-07-16 DIAGNOSIS — W11XXXA Fall on and from ladder, initial encounter: Secondary | ICD-10-CM | POA: Diagnosis not present

## 2018-07-16 DIAGNOSIS — I639 Cerebral infarction, unspecified: Secondary | ICD-10-CM | POA: Diagnosis not present

## 2018-07-16 MED ORDER — ACETAMINOPHEN 500 MG PO TABS
1000.0000 mg | ORAL_TABLET | Freq: Once | ORAL | Status: AC
  Administered 2018-07-16: 1000 mg via ORAL
  Filled 2018-07-16: qty 2

## 2018-07-16 MED ORDER — IBUPROFEN 400 MG PO TABS
400.0000 mg | ORAL_TABLET | Freq: Once | ORAL | Status: AC
  Administered 2018-07-16: 400 mg via ORAL
  Filled 2018-07-16: qty 1

## 2018-07-16 MED ORDER — TETANUS-DIPHTH-ACELL PERTUSSIS 5-2.5-18.5 LF-MCG/0.5 IM SUSP
0.5000 mL | Freq: Once | INTRAMUSCULAR | Status: AC
  Administered 2018-07-16: 0.5 mL via INTRAMUSCULAR
  Filled 2018-07-16: qty 0.5

## 2018-07-16 MED ORDER — CYCLOBENZAPRINE HCL 10 MG PO TABS: 10.0000 mg | ORAL_TABLET | Freq: Two times a day (BID) | ORAL | 0 refills | Status: DC | PRN

## 2018-07-16 MED ORDER — LIDOCAINE-EPINEPHRINE (PF) 2 %-1:200000 IJ SOLN
10.0000 mL | Freq: Once | INTRAMUSCULAR | Status: AC
  Administered 2018-07-16: 10 mL
  Filled 2018-07-16 (×2): qty 10

## 2018-07-16 NOTE — ED Notes (Signed)
Irrigated posterior head laceration

## 2018-07-16 NOTE — ED Provider Notes (Signed)
[name] Provider Note   CSN: [id number] Arrival date & time: 07/16/18  1939    History   Chief Complaint Chief Complaint  Patient presents with  . Fall  . Back Pain  . Laceration    HPI Taylor [name] is a 68 y.o. male with h/o CAD s/p CABG presents to ED for evaluation of fall that occurred around 2 pm today. He was up on a ladder approx 6 ft off the ground when the ladder tipped backwards causing him to fall.  He landed on his low back on some cinder blocks. He reports posterior head wound and diffuse low back pain, mild to moderate, worse with sitting up and walking but minimal at rest or when sitting.  On aspirin.  Last tetanus unknown.    Denies other injuries after the fall including neck pain, chest pain, shortness of breath, abdominal pain, extremity injuries.  No associated saddle anesthesia, loss of bladder/bowel control, numbness or weakness to extremities. No LOC, HA, vision changes. No interventions. No alleviating factors.     HPI  Past Medical History:  Diagnosis Date  . Bigeminal rhythm 08/01/2017   per EKG 07-24-2017 in epic (per confirmation new since last ecg in 2007)  in care everywhere in epic ecg dated 2012 shows sinus brady rate 53 no pvc's --per pt asymptomatic and   . Bradycardia   . Coronary artery disease involving native coronary artery 08/2017   Abnormal Myoview 08/2017 (Abnormal EKG - Severe ST depressions, PVCs & nsVT). TID noted.  Moderate LAD territory Ischemic Perfusion defect --> Cath with extensive RCA (RPDA & RPAV), Cx & LAD disease --> CABG x 4  . Family history of premature coronary artery disease    Father - MI 70. Paternal uncle x 2 -1 with MI & other with CABG in 49s  . History of contusion    ED visit 08-04-2013 , 9 days past injury ,  dx concussion w/ LOC, per CT inferior frontal lobes contusion hemarrhage-- residual headache (08-01-2017  per pt residual resolved)  . History of kidney stones   .  Hypercholesterolemia   . Hypertension   . Nephrolithiasis    bilateral nonobstructive per CT 07-24-2017  . S/P CABG x 4 09/06/2017   LIMA to LAD, SVG to OM2, Sequential SVG to PDA and RPL2, EVH via right thigh and leg  . Type 2 diabetes mellitus ([place])    followed by pcp  . Ureteral calculi    left  . Wears glasses   . Wears hearing aid in both ears     Patient Active Problem List   Diagnosis Date Noted  . PSVT (paroxysmal supraventricular tachycardia) ([place]) 01/09/2018  . PAF (paroxysmal atrial fibrillation) ([place]) 10/22/2017  . Ischemic cardiomyopathy - pre-CABG ECHO: 40-45% 09/14/2017  . S/P CABG x 4 09/06/2017  . Coronary artery disease involving native coronary artery of native heart without angina pectoris 09/03/2017  . Ventricular bigeminy 08/29/2017  . Essential hypertension 08/29/2017  . Hyperlipidemia associated with type 2 diabetes mellitus ([place]) 08/29/2017  . Family history of premature coronary artery disease 08/29/2017  . Frequent unifocal PVCs 08/29/2017    Past Surgical History:  Procedure Laterality Date  . CORONARY ARTERY BYPASS GRAFT N/A 09/06/2017   Procedure: CORONARY ARTERY BYPASS GRAFTING (CABG) x 4, using left internal mammary artery and right leg greater saphenous vein harvested endoscopically;  Surgeon: Rexene Alberts, MD;  Location: [facility] OR;  Service: Open Heart Surgery;; LIMA-LAD, SVG-OM2, SeqSVG-RPDA-RPL2  .  CYSTOSCOPY/RETROGRADE/URETEROSCOPY Left 08/06/2017   Procedure: CYSTOSCOPY/RETROGRADE/URETEROSCOPY laser and stone basket extrection;  Surgeon: Cleon Gustin, MD;  Location: [facility];  Service: Urology;  Laterality: Left;  . LAPAROSCOPIC APPENDECTOMY  11-18-2005    dr wyatt  [facility]  . LEFT HEART CATH AND CORONARY ANGIOGRAPHY N/A 09/03/2017   Procedure: LEFT HEART CATH AND CORONARY ANGIOGRAPHY;  Surgeon: Leonie Man, MD;  Location: [facility];  Service: Cardiovascular;; mRCA 70%, dRCA 99%-95% into RPAV & 90% ostRPDA 95%.  RPL1 70%, distal RVAM (tandem PDA) - 85%. Ost-prox LAD 40% -pLAD 60&90% @ D1 ->p-mLAD 95% @ D2 with 70% D2. pCx 80%, p-mCx 90%-> mCx 85%.  EF 45-50%, global HK. -->> referred for CABG  . NASAL ENDOSCOPY WITH EPISTAXIS CONTROL N/A 11/29/2017   Procedure: NASAL ENDOSCOPY WITH EPISTAXIS CONTROL;  Surgeon: Leta Baptist, MD;  Location: [place];  Service: ENT;  Laterality: N/A;  . NM MYOVIEW LTD  08/31/2017   HIGH RISK: Combination Imaging & EKG:  with  in tmedium sized, moderate severity Reversible perfusion e apdefect in  the mid and apical anterior, apical lateral walls - FINDINGS ARE C/W ISCHEMIA in the LAD territory,  however given significant arrhythmias, severe ST depressions and TID =  MV CAD is highly likely (SDS =7).   . ORCHIECTOMY  1965   unilateral -- per pt ruptured  . TEE WITHOUT CARDIOVERSION N/A 09/06/2017   Procedure: TRANSESOPHAGEAL ECHOCARDIOGRAM (TEE);  INTRA-OP.  Surgeon: Rexene Alberts, MD;  Location: [facility] OR;  Service: Open Heart Surgery:;; Normal AoV, MV & TV.  Normal RV.  Marland Kitchen TRANSTHORACIC ECHOCARDIOGRAM  09/04/2017   (pre-CABG): EF 40-45%, diffuse HK.  Difficult to assess due to frequent PVCs. Severe LA dilation. Mildly increased PAP.  Marland Kitchen UMBILICAL HERNIA REPAIR  2006  . VARICOSE VEIN SURGERY  2007   left endovascular laser ablation        Home Medications    Prior to Admission medications   Medication Sig Start Date End Date Taking? Authorizing Provider  aspirin EC 81 MG tablet Take 1 tablet (81 mg total) by mouth daily. 10/25/17  Yes Leonie Man, MD  atorvastatin (LIPITOR) 80 MG tablet Take 1 tablet (80 mg total) by mouth daily at 6 PM. 11/05/17  Yes Leonie Man, MD  lisinopril (PRINIVIL,ZESTRIL) 10 MG tablet TAKE 1 TABLET BY MOUTH EVERY DAY 12/07/17  Yes Leonie Man, MD  metoprolol succinate (TOPROL XL) 25 MG 24 hr tablet Take 1 tablet (25 mg total) by mouth 2 (two) times daily. 01/09/18  Yes Leonie Man, MD  metoprolol tartrate  (LOPRESSOR) 25 MG tablet Take 1 tablet (25 mg total) by mouth as needed. TAKE FOR PROLONG INCREASE HEART RATE OR PALPATIONS 01/09/18  Yes Leonie Man, MD  Multiple Vitamin (MULTIVITAMIN) tablet Take 1 tablet by mouth daily.   Yes [provider]  sitaGLIPtan-metformin (JANUMET) 50-500 MG per tablet Take 1 tablet by mouth 2 (two) times daily with a meal.    Yes [provider]  cyclobenzaprine (FLEXERIL) 10 MG tablet Take 1 tablet (10 mg total) by mouth 2 (two) times daily as needed for muscle spasms. 07/16/18   Kinnie Feil, PA-C    Family History Family History  Problem Relation Age of Onset  . Stroke Mother   . Atrial fibrillation Mother   . Clotting disorder Father   . Heart attack Father 46       Died from complications  . Diabetes Daughter   .  Heart disease Other   . Stroke Other   . Heart attack Paternal Uncle        In his 17s  . CAD Paternal Uncle        Has had either bypass surgery or stents    Social History Social History   Tobacco Use  . Smoking status: Never Smoker  . Smokeless tobacco: Never Used  Substance Use Topics  . Alcohol use: No  . Drug use: No     Allergies   Patient has no known allergies.   Review of Systems Review of Systems  Musculoskeletal: Positive for back pain and gait problem.  Skin: Positive for wound.  All other systems reviewed and are negative.    Physical Exam Updated Vital Signs BP (!) 147/67   Pulse (!) 57   Temp 98 F (36.7 C) (Oral)   Resp 16   Ht 5\' 11"  (1.803 m)   Wt 98.9 kg   SpO2 94%   BMI 30.40 kg/m   Physical Exam Constitutional:      General: He is not in acute distress.    Appearance: He is well-developed.  HENT:     Head: Laceration present.     Comments: 5 cm straight laceration to the left upper scalp, minimal tenderness.  No other surrounding scalp injuries.  No facial, nasal bone tenderness or deformities.    Ears:     Comments: No Battle's sign.    Nose:      Comments: No intranasal bleeding or rhinorrhea. Septum midline    Mouth/Throat:     Comments: Contusion to right upper lip, no laceration to lip or mucosa. No intraoral bleeding or injury. No malocclusion. MMM. Dentition appears stable.  Eyes:     Conjunctiva/sclera: Conjunctivae normal.     Comments: Lids normal. EOMs and PERRL intact. No racoon's eyes   Neck:     Comments: C-spine: no midline or paraspinal muscular tenderness. Full active ROM of cervical spine w/o pain. Trachea midline Cardiovascular:     Rate and Rhythm: Normal rate and regular rhythm.     Pulses:          Radial pulses are 1+ on the right side and 1+ on the left side.       Dorsalis pedis pulses are 1+ on the right side and 1+ on the left side.     Heart sounds: Normal heart sounds, S1 normal and S2 normal.     Comments: No tenderness to A/P thorax. No ecchymosis to chest  Pulmonary:     Effort: Pulmonary effort is normal.     Breath sounds: Normal breath sounds. No decreased breath sounds.  Abdominal:     Palpations: Abdomen is soft.     Comments: Abdomen/flank without ecchymosis or tenderness   Musculoskeletal: Normal range of motion.        General: No deformity.     Lumbar back: He exhibits pain.     Comments:  TL-spine: no paraspinal muscular tenderness or midline tenderness.  Pain reported with sitting up and walking but none with palpation/percussion.  Pelvis: no instability with AP/L compression, leg shortening or rotation. Full PROM of hips bilaterally without pain. Negative SLR bilaterally.  Full active ROM of upper/lower extremities without pain Ambulatory in room, slow getting out of bed due to low back pain   Skin:    General: Skin is warm and dry.     Capillary Refill: Capillary refill takes less than 2 seconds.  Neurological:  Mental Status: He is alert, oriented to person, place, and time and easily aroused.     Comments: Speech is fluent without obvious dysarthria or dysphasia. Strength 5/5  with hand grip and ankle F/E.   Sensation to light touch intact in hands and feet.  CN II-XII grossly intact bilaterally.   Psychiatric:        Behavior: Behavior normal. Behavior is cooperative.        Thought Content: Thought content normal.      ED Treatments / Results  Labs (all labs ordered are listed, but only abnormal results are displayed) Labs Reviewed - No data to display  EKG None  Radiology Dg Lumbar Spine Complete  Result Date: 07/16/2018 CLINICAL DATA:  Lower back pain after fall today. EXAM: LUMBAR SPINE - COMPLETE 4+ VIEW COMPARISON:  CT scan of July 24, 2017. FINDINGS: No fracture or spondylolisthesis is noted. Moderate degenerative disc disease is noted at L4-5 and L5-S1 with anterior osteophyte formation. Anterior osteophyte formation is noted at other levels of the lumbar spine is well. IMPRESSION: Multilevel degenerative disc disease. No acute abnormality seen in the lumbar spine. Electronically Signed   By: Marijo Conception M.D.   On: 07/16/2018 21:13   Dg Sacrum/coccyx  Result Date: 07/16/2018 CLINICAL DATA:  Pain after fall today. EXAM: SACRUM AND COCCYX - 2+ VIEW COMPARISON:  CT scan of July 24, 2017. FINDINGS: There is no evidence of fracture or other focal bone lesions. IMPRESSION: Negative. Electronically Signed   By: Marijo Conception M.D.   On: 07/16/2018 21:14   Ct Head Wo Contrast  Result Date: 07/16/2018 CLINICAL DATA:  Posterior scalp laceration after falling 6 ft off a ladder today. EXAM: CT HEAD WITHOUT CONTRAST TECHNIQUE: Contiguous axial images were obtained from the base of the skull through the vertex without intravenous contrast. COMPARISON:  08/11/2013. FINDINGS: Brain: Old left frontal lobe infarct. Mild-to-moderate enlargement of the ventricles and subarachnoid spaces with mild progression. Interval small right frontal lobe infarct, age indeterminate. No intracranial hemorrhage or mass lesion. Vascular: No hyperdense vessel or unexpected  calcification. Skull: Normal. Negative for fracture or focal lesion. Sinuses/Orbits: No acute finding. Other: Bandage material overlying the left posterior scalp with minimal underlying subcutaneous air. IMPRESSION: 1. No skull fracture or intracranial hemorrhage. 2. Interval small right frontal lobe infarct, age indeterminate. 3. Old left frontal lobe infarct. 4. Mild to moderate diffuse cerebral and cerebellar atrophy with mild progression. Electronically Signed   By: Claudie Revering M.D.   On: 07/16/2018 20:58    Procedures Procedures (including critical care time)  Medications Ordered in ED Medications  acetaminophen (TYLENOL) tablet 1,000 mg (1,000 mg Oral Given 07/16/18 2102)  ibuprofen (ADVIL) tablet 400 mg (400 mg Oral Given 07/16/18 2102)  Tdap (BOOSTRIX) injection 0.5 mL (0.5 mLs Intramuscular Given 07/16/18 2103)  lidocaine-EPINEPHrine (XYLOCAINE W/EPI) 2 %-1:200000 (PF) injection 10 mL (10 mLs Infiltration Given 07/16/18 2103)     Initial Impression / Assessment and Plan / ED Course  I have reviewed the triage vital signs and the nursing notes.  Pertinent labs & imaging results that were available during my care of the patient were reviewed by me and considered in my medical decision making (see chart for details).  Clinical Course as of Jul 15 2301  Tue Jul 16, 2018  2123 No fracture or spondylolisthesis is noted. Moderate degenerative disc disease is noted at L4-5 and L5-S1 with anterior osteophyte formation. Anterior osteophyte formation is noted at other levels of the  lumbar spine is well.  DG Lumbar Spine Complete [CG]    Clinical Course User Index [CG] Kinnie Feil, PA-C      68 y.o. yo male here after mechanical fall height 6 ft with scalp laceration and diffuse low back pain. HD stable on arrival.  Alert.   Exam revealed scalp laceration which was repaired without immediate complications.  Head CT is normal. Neuro exam normal. He reported mild low back pain, non  reproducible with palpation/percussion.  Ambulatory.  No cauda equina symptoms. NVI.  Lumbar/sacral x-ray without acute injuries.  No obvious signs of other significant CT spine, chest, abdominal, pelvis, extremity injury.   Results discussed with patient. Return precautions given. Symptomatic tx and f/u with PCP for re-evaluation for persistent symptoms. Pt in agreement with ER tx and discharge plan.  Shared with EDP.   Final Clinical Impressions(s) / ED Diagnoses   Final diagnoses:  Laceration of scalp, initial encounter  Fall from ladder, initial encounter  Acute bilateral low back pain without sciatica    ED Discharge Orders         Ordered    cyclobenzaprine (FLEXERIL) 10 MG tablet  2 times daily PRN     07/16/18 2201           Kinnie Feil, PA-C 07/16/18 2303    Varney Biles, MD 07/16/18 [phone]

## 2018-07-16 NOTE — Discharge Instructions (Signed)
You were seen in the ED after a fall off a ladder.  You reported left scalp wound and low back pain after the fall.  CT of the head did not show any intracranial injuries.  Your wound was repaired with staples.  The staples should remain in place for a total of 7 days, at that time you should have a wound check and staple removal.  Your tetanus was updated today. Monitor for signs of infection including redness, warmth, pus, fevers, increased pain.  You can apply ice and take tylenol for associated scalp pain.   X-ray did not show bony injury to your back.  Your pain is likely from muscular pain, spasms, inflammation. For pain and inflammation you can use a combination of ibuprofen and acetaminophen.  Take [phone] mg acetaminophen (tylenol) every 6 hours or 600 mg ibuprofen (advil, motrin) every 6 hours.  You can take these separately or combine them every 6 hours for maximum pain control. Do not exceed 4,000 mg acetaminophen or 2,400 mg ibuprofen in a 24 hour period.  Do not take ibuprofen containing products if you have history of kidney disease, ulcers, GI bleeding, severe acid reflux, or take a blood thinner.  Do not take acetaminophen if you have liver disease. Cyclobenzaprine is a muscle relaxer that may help with sudden spasms in your back. Take this every 12 hours as needed. This medicine can cause drowsiness, so caution with driving and operating heavy machinery.  Return to the ED for severe or worsening back pain, numbness in your groin, loss of sensation or strength in extremities, loss of bladder or bowel function

## 2018-07-16 NOTE — ED Notes (Signed)
Patient denies pain and is resting comfortably.  

## 2018-07-16 NOTE — ED Triage Notes (Signed)
He fell off a 6' ladder this afternoon. He had stacked cinder block and wood on the top of the ladder. The cinder block and wood fell to the ground first and he landed on top of the building materials. He has a laceration to the back of his head. No LOC. Pain in his left buttock and back.

## 2018-07-23 ENCOUNTER — Emergency Department (HOSPITAL_BASED_OUTPATIENT_CLINIC_OR_DEPARTMENT_OTHER)
Admission: EM | Admit: 2018-07-23 | Discharge: 2018-07-23 | Disposition: A | Discharge status: Home / Self Care | Payer: PPO | Attending: Emergency Medicine | Admitting: Emergency Medicine

## 2018-07-23 ENCOUNTER — Other Ambulatory Visit: Payer: Self-pay

## 2018-07-23 ENCOUNTER — Encounter (HOSPITAL_BASED_OUTPATIENT_CLINIC_OR_DEPARTMENT_OTHER): Payer: Self-pay | Admitting: Emergency Medicine

## 2018-07-23 DIAGNOSIS — Z7982 Long term (current) use of aspirin: Secondary | ICD-10-CM | POA: Insufficient documentation

## 2018-07-23 DIAGNOSIS — W11XXXD Fall on and from ladder, subsequent encounter: Secondary | ICD-10-CM | POA: Diagnosis not present

## 2018-07-23 DIAGNOSIS — Z79899 Other long term (current) drug therapy: Secondary | ICD-10-CM | POA: Insufficient documentation

## 2018-07-23 DIAGNOSIS — Z4802 Encounter for removal of sutures: Secondary | ICD-10-CM | POA: Diagnosis present

## 2018-07-23 DIAGNOSIS — S0101XD Laceration without foreign body of scalp, subsequent encounter: Secondary | ICD-10-CM | POA: Insufficient documentation

## 2018-07-23 DIAGNOSIS — I10 Essential (primary) hypertension: Secondary | ICD-10-CM | POA: Insufficient documentation

## 2018-07-23 DIAGNOSIS — I2510 Atherosclerotic heart disease of native coronary artery without angina pectoris: Secondary | ICD-10-CM | POA: Insufficient documentation

## 2018-07-23 DIAGNOSIS — E119 Type 2 diabetes mellitus without complications: Secondary | ICD-10-CM | POA: Insufficient documentation

## 2018-07-23 DIAGNOSIS — Z951 Presence of aortocoronary bypass graft: Secondary | ICD-10-CM | POA: Insufficient documentation

## 2018-07-23 NOTE — ED Provider Notes (Signed)
[facility] EMERGENCY DEPARTMENT Provider Note   CSN: [id number] Arrival date & time: 07/23/18  8676    History   Chief Complaint Chief Complaint  Patient presents with  . Suture / Staple Removal    HPI Taylor [name] is a 68 y.o. male.     The history is provided by the patient.  Suture / Staple Removal  This is a new (fell off a ladder 1 week ago and here for staple removal.) problem. Episode onset: 1 week ago. The problem has been gradually improving. Associated symptoms comments: Back pain has resolved and feeling better.  No headaches or other complaints.  Just here for staple removal.. Nothing aggravates the symptoms. Nothing relieves the symptoms. He has tried nothing for the symptoms.    Past Medical History:  Diagnosis Date  . Bigeminal rhythm 08/01/2017   per EKG 07-24-2017 in epic (per confirmation new since last ecg in 2007)  in care everywhere in epic ecg dated 2012 shows sinus brady rate 53 no pvc's --per pt asymptomatic and   . Bradycardia   . Coronary artery disease involving native coronary artery 08/2017   Abnormal Myoview 08/2017 (Abnormal EKG - Severe ST depressions, PVCs & nsVT). TID noted.  Moderate LAD territory Ischemic Perfusion defect --> Cath with extensive RCA (RPDA & RPAV), Cx & LAD disease --> CABG x 4  . Family history of premature coronary artery disease    Father - MI 58. Paternal uncle x 2 -1 with MI & other with CABG in 69s  . History of contusion    ED visit 08-04-2013 , 9 days past injury ,  dx concussion w/ LOC, per CT inferior frontal lobes contusion hemarrhage-- residual headache (08-01-2017  per pt residual resolved)  . History of kidney stones   . Hypercholesterolemia   . Hypertension   . Nephrolithiasis    bilateral nonobstructive per CT 07-24-2017  . S/P CABG x 4 09/06/2017   LIMA to LAD, SVG to OM2, Sequential SVG to PDA and RPL2, EVH via right thigh and leg  . Type 2 diabetes mellitus ([place])    followed by pcp  .  Ureteral calculi    left  . Wears glasses   . Wears hearing aid in both ears     Patient Active Problem List   Diagnosis Date Noted  . PSVT (paroxysmal supraventricular tachycardia) ([place]) 01/09/2018  . PAF (paroxysmal atrial fibrillation) ([place]) 10/22/2017  . Ischemic cardiomyopathy - pre-CABG ECHO: 40-45% 09/14/2017  . S/P CABG x 4 09/06/2017  . Coronary artery disease involving native coronary artery of native heart without angina pectoris 09/03/2017  . Ventricular bigeminy 08/29/2017  . Essential hypertension 08/29/2017  . Hyperlipidemia associated with type 2 diabetes mellitus ([place]) 08/29/2017  . Family history of premature coronary artery disease 08/29/2017  . Frequent unifocal PVCs 08/29/2017    Past Surgical History:  Procedure Laterality Date  . CORONARY ARTERY BYPASS GRAFT N/A 09/06/2017   Procedure: CORONARY ARTERY BYPASS GRAFTING (CABG) x 4, using left internal mammary artery and right leg greater saphenous vein harvested endoscopically;  Surgeon: Rexene Alberts, MD;  Location: [facility] OR;  Service: Open Heart Surgery;; LIMA-LAD, SVG-OM2, SeqSVG-RPDA-RPL2  . CYSTOSCOPY/RETROGRADE/URETEROSCOPY Left 08/06/2017   Procedure: CYSTOSCOPY/RETROGRADE/URETEROSCOPY laser and stone basket extrection;  Surgeon: Cleon Gustin, MD;  Location: [facility];  Service: Urology;  Laterality: Left;  . LAPAROSCOPIC APPENDECTOMY  11-18-2005    dr wyatt  [facility]  . LEFT HEART CATH AND CORONARY ANGIOGRAPHY N/A  09/03/2017   Procedure: LEFT HEART CATH AND CORONARY ANGIOGRAPHY;  Surgeon: Leonie Man, MD;  Location: [place] [facility] LAB;  Service: Cardiovascular;; mRCA 70%, dRCA 99%-95% into RPAV & 90% ostRPDA 95%. RPL1 70%, distal RVAM (tandem PDA) - 85%. Ost-prox LAD 40% -pLAD 60&90% @ D1 ->p-mLAD 95% @ D2 with 70% D2. pCx 80%, p-mCx 90%-> mCx 85%.  EF 45-50%, global HK. -->> referred for CABG  . NASAL ENDOSCOPY WITH EPISTAXIS CONTROL N/A 11/29/2017   Procedure: NASAL ENDOSCOPY WITH  EPISTAXIS CONTROL;  Surgeon: Leta Baptist, MD;  Location: [place];  Service: ENT;  Laterality: N/A;  . NM MYOVIEW LTD  08/31/2017   HIGH RISK: Combination Imaging & EKG:  with  in tmedium sized, moderate severity Reversible perfusion e apdefect in  the mid and apical anterior, apical lateral walls - FINDINGS ARE C/W ISCHEMIA in the LAD territory,  however given significant arrhythmias, severe ST depressions and TID =  MV CAD is highly likely (SDS =7).   . ORCHIECTOMY  1965   unilateral -- per pt ruptured  . TEE WITHOUT CARDIOVERSION N/A 09/06/2017   Procedure: TRANSESOPHAGEAL ECHOCARDIOGRAM (TEE);  INTRA-OP.  Surgeon: Rexene Alberts, MD;  Location: [facility] OR;  Service: Open Heart Surgery:;; Normal AoV, MV & TV.  Normal RV.  Marland Kitchen TRANSTHORACIC ECHOCARDIOGRAM  09/04/2017   (pre-CABG): EF 40-45%, diffuse HK.  Difficult to assess due to frequent PVCs. Severe LA dilation. Mildly increased PAP.  Marland Kitchen UMBILICAL HERNIA REPAIR  2006  . VARICOSE VEIN SURGERY  2007   left endovascular laser ablation        Home Medications    Prior to Admission medications   Medication Sig Start Date End Date Taking? Authorizing Provider  aspirin EC 81 MG tablet Take 1 tablet (81 mg total) by mouth daily. 10/25/17   Leonie Man, MD  atorvastatin (LIPITOR) 80 MG tablet Take 1 tablet (80 mg total) by mouth daily at 6 PM. 11/05/17   Leonie Man, MD  cyclobenzaprine (FLEXERIL) 10 MG tablet Take 1 tablet (10 mg total) by mouth 2 (two) times daily as needed for muscle spasms. 07/16/18   Kinnie Feil, PA-C  lisinopril (PRINIVIL,ZESTRIL) 10 MG tablet TAKE 1 TABLET BY MOUTH EVERY DAY 12/07/17   Leonie Man, MD  metoprolol succinate (TOPROL XL) 25 MG 24 hr tablet Take 1 tablet (25 mg total) by mouth 2 (two) times daily. 01/09/18   Leonie Man, MD  metoprolol tartrate (LOPRESSOR) 25 MG tablet Take 1 tablet (25 mg total) by mouth as needed. TAKE FOR PROLONG INCREASE HEART RATE OR PALPATIONS 01/09/18    Leonie Man, MD  Multiple Vitamin (MULTIVITAMIN) tablet Take 1 tablet by mouth daily.    [provider]  sitaGLIPtan-metformin (JANUMET) 50-500 MG per tablet Take 1 tablet by mouth 2 (two) times daily with a meal.     [provider]    Family History Family History  Problem Relation Age of Onset  . Stroke Mother   . Atrial fibrillation Mother   . Clotting disorder Father   . Heart attack Father 51       Died from complications  . Diabetes Daughter   . Heart disease Other   . Stroke Other   . Heart attack Paternal Uncle        In his 75s  . CAD Paternal Uncle        Has had either bypass surgery or stents    Social History  Social History   Tobacco Use  . Smoking status: Never Smoker  . Smokeless tobacco: Never Used  Substance Use Topics  . Alcohol use: No  . Drug use: No     Allergies   Patient has no known allergies.   Review of Systems Review of Systems  All other systems reviewed and are negative.    Physical Exam Updated Vital Signs BP (!) 161/82 (BP Location: Right Arm)   Pulse 63   Temp 98 F (36.7 C) (Oral)   Resp 18   Ht 5\' 11"  (1.803 m)   Wt 97.5 kg   SpO2 98%   BMI 29.99 kg/m   Physical Exam Vitals signs and nursing note reviewed.  Constitutional:      General: He is not in acute distress.    Appearance: Normal appearance. He is normal weight.  HENT:     Head: Normocephalic.   Cardiovascular:     Rate and Rhythm: Normal rate.  Pulmonary:     Effort: Pulmonary effort is normal.  Skin:    General: Skin is warm and dry.  Neurological:     Mental Status: He is alert. Mental status is at baseline.  Psychiatric:        Mood and Affect: Mood normal.        Behavior: Behavior normal.        Thought Content: Thought content normal.      ED Treatments / Results  Labs (all labs ordered are listed, but only abnormal results are displayed) Labs Reviewed - No data to display  EKG None  Radiology No results  found.  Procedures .Suture Removal Date/Time: 07/23/2018 7:58 AM Performed by: Blanchie Dessert, MD Authorized by: Blanchie Dessert, MD   Consent:    Consent obtained:  Verbal   Consent given by:  Patient   Risks discussed:  Bleeding and pain Location:    Location:  Head/neck   Head/neck location:  Scalp Procedure details:    Wound appearance:  No signs of infection, good wound healing and clean   Number of staples removed:  4 Post-procedure details:    Post-removal:  No dressing applied   Patient tolerance of procedure:  Tolerated well, no immediate complications   (including critical care time)  Medications Ordered in ED Medications - No data to display   Initial Impression / Assessment and Plan / ED Course  I have reviewed the triage vital signs and the nursing notes.  Pertinent labs & imaging results that were available during my care of the patient were reviewed by me and considered in my medical decision making (see chart for details).        Pt here for staple removal.  Wound healing well.  Pt is doing well and states back to baseline since the fall.  Final Clinical Impressions(s) / ED Diagnoses   Final diagnoses:  Removal of staples    ED Discharge Orders    None       Blanchie Dessert, MD 07/23/18 [phone]

## 2018-07-23 NOTE — ED Triage Notes (Signed)
Pt presents to ED for followup to have sutures removed from back of head.

## 2018-07-23 NOTE — ED Notes (Signed)
Pt states he is returning for followup to have sutures removed from head. Pt denies pain

## 2018-07-29 DIAGNOSIS — N201 Calculus of ureter: Secondary | ICD-10-CM | POA: Diagnosis not present

## 2018-07-29 DIAGNOSIS — N132 Hydronephrosis with renal and ureteral calculous obstruction: Secondary | ICD-10-CM | POA: Diagnosis not present

## 2018-07-29 DIAGNOSIS — R310 Gross hematuria: Secondary | ICD-10-CM | POA: Diagnosis not present

## 2018-07-30 ENCOUNTER — Other Ambulatory Visit (HOSPITAL_COMMUNITY)
Admission: RE | Admit: 2018-07-30 | Discharge: 2018-07-30 | Disposition: A | Discharge status: Home / Self Care | Payer: PPO | Source: Ambulatory Visit | Attending: Urology | Admitting: Urology

## 2018-07-30 ENCOUNTER — Other Ambulatory Visit: Payer: Self-pay | Admitting: Urology

## 2018-07-30 DIAGNOSIS — Z1159 Encounter for screening for other viral diseases: Secondary | ICD-10-CM | POA: Insufficient documentation

## 2018-07-30 NOTE — Patient Instructions (Addendum)
Taylor [name]    Your procedure is scheduled on: 08-02-2018   Report to [facility]  Entrance Report to admitting at 1:30 PM   YOU  HAD  A COVID 19 TEST ON 07-30-2018.   ONCE YOUR COVID TEST IS COMPLETED, PLEASE BEGIN THE QUARANTINE INSTRUCTIONS AS OUTLINED IN YOUR HANDOUT.   Call this number if you have problems the morning of surgery [phone]     Remember: Do not eat food after Midnight.  CLEAR LIQUIDS FROM MIDNIGHT UNTIL 9:30 AM.  NOTHING BY MOUTH AFTER 9:30 AM.    CLEAR LIQUID DIET         Sugar free   Foods Allowed                                                                     Foods Excluded  Coffee and tea, regular and decaf                             liquids that you cannot  Plain Jell-O in any flavor                                             see through such as: Fruit ices (not with fruit pulp)                                     milk, soups, orange juice  Iced Popsicles                                    All solid food Carbonated beverages, diet                                    Cranberry, grape and apple juices Sports drinks like Gatorade Lightly seasoned clear broth or consume(fat free)    BRUSH YOUR TEETH MORNING OF SURGERY AND RINSE YOUR MOUTH OUT, NO CHEWING GUM CANDY OR MINTS.     Take these medicines the morning of surgery with A SIP OF WATER:       HYDROCODONE IF NEEDED, METOPROLOL SUCCINATE, METOPROLOL TARTRATE IF NEEDED, TAMSULOSIN (FLOMAX)   How to Manage Your Diabetes  Before and After Surgery   Why is it important to control my blood sugar before and after surgery? . Improving blood sugar levels before and after surgery helps healing and can limit problems. . A way of improving blood sugar control is eating a healthy diet by: o  Eating less sugar and carbohydrates o  Increasing activity/exercise o  Talking with your doctor about reaching your blood sugar goals . High blood sugars (greater than 180  mg/dL) can raise your risk of infections and slow your recovery, so you will need to focus on controlling your diabetes during the weeks  before surgery. . Make sure that the doctor who takes care of your diabetes knows about your planned surgery including the date and location. .   How do I manage my blood sugar before surgery? . Check your blood sugar at least 4 times a day, starting 2 days before surgery, to make sure that the level is not too high or low. o Check your blood sugar the morning of your surgery when you wake up and every 2 hours until you get to the Short Stay unit. . If your blood sugar is less than 70 mg/dL, you will need to treat for low blood sugar: o Do not take insulin. o Treat a low blood sugar (less than 70 mg/dL) with  cup of clear juice (cranberry or apple), 4 glucose tablets, OR glucose gel. o Recheck blood sugar in 15 minutes after treatment (to make sure it is greater than 70 mg/dL). If your blood sugar is not greater than 70 mg/dL on recheck, call [phone] for further instructions. . Report your blood sugar to the short stay nurse when you get to Short Stay. .   . If you are admitted to the hospital after surgery: o Your blood sugar will be checked by the staff and you will probably be given insulin after surgery (instead of oral diabetes medicines) to make sure you have good blood sugar levels. o The goal for blood sugar control after surgery is 80-180 mg/dL.   WHAT DO I DO ABOUT MY DIABETES MEDICATION?  Marland Kitchen Do not take oral diabetes medicines (pills) the morning of surgery.  . THE DAY  BEFORE SURGERY TAKE JANUMET AS USUAL     . THE MORNING OF SURGERY DO NOT TAKE JANUMET.  .                                                                                                     DO NOT TAKE ANY DIABETIC MEDICATIONS DAY OF YOUR SURGERY                                 You may not have any metal on your body including piercings             Do not wear  jewelry,  lotions, powders or  deodorant             Men may shave face and neck.    Do not bring valuables to the hospital. [place].  Contacts, dentures or bridgework may not be worn into surgery.       Patients discharged the day of surgery will not be allowed to drive home.   IF YOU ARE HAVING SURGERY AND GOING HOME THE SAME DAY, YOU MUST HAVE AN ADULT TO DRIVE YOU HOME AND BE WITH YOU FOR 24 HOURS.  YOU MAY GO HOME BY TAXI OR UBER OR ORTHERWISE, BUT AN ADULT MUST ACCOMPANY YOU HOME AND  STAY WITH YOU FOR 24 HOURS   Name and phone number of your driver: wife Katharine Look cell [phone]  Special Instructions: N/A              Please read over the following fact sheets you were given:    [facility] - Preparing for Surgery Before surgery, you can play an important role.   Because skin is not sterile, your skin needs to be as free of germs as possible .  You can reduce the number of germs on your skin by washing with CHG (chlorahexidine gluconate) soap before surgery.   CHG is an antiseptic cleaner which kills germs and bonds with the skin to continue killing germs even after washing. Please DO NOT use if you have an allergy to CHG or antibacterial soaps.   If your skin becomes reddened/irritated stop using the CHG and inform your nurse when you arrive at Short Stay. Do not shave (including legs and underarms) for at least 48 hours prior to the first CHG shower.   You may shave your face/neck. Please follow these instructions carefully :  1.  Shower with CHG Soap the night before surgery and the  morning of Surgery.  2.  If you choose to wash your hair, wash your hair first as usual with your  normal  shampoo.  3.  After you shampoo, rinse your hair and body thoroughly to remove the  shampoo.                                        4.  Use CHG as you would any other liquid soap.  You can apply chg directly  to the skin and wash                        Gently with a scrungie or clean washcloth.  5.  Apply the CHG Soap to your body ONLY FROM THE NECK DOWN.   Do not use on face/ open                           Wound or open sores. Avoid contact with eyes, ears mouth and genitals (private parts).                       Wash face,  Genitals (private parts) with your normal soap.             6.  Wash thoroughly, paying special attention to the area where your surgery  will be performed.  7.  Thoroughly rinse your body with warm water from the neck down.  8.  DO NOT shower/wash with your normal soap after using and rinsing off  the CHG Soap.             9.  Pat yourself dry with a clean towel.            10.  Wear clean pajamas.            11.  Place clean sheets on your bed the night of your first shower and do not  sleep with pets . Day of Surgery : Do not apply any lotions/deodorants the morning of surgery.  Please wear clean clothes to the hospital/surgery center.  FAILURE TO FOLLOW THESE INSTRUCTIONS MAY RESULT IN THE CANCELLATION OF YOUR  SURGERY PATIENT SIGNATURE_________________________________  NURSE SIGNATURE__________________________________  ________________________________________________________________________

## 2018-07-30 NOTE — Progress Notes (Signed)
DR CAMNITZ CARDIOLOGY LOV 04-16-18 Epic EKG 01-16-18 Epic CHEST XRAY 1 VIEW 01-04-18 Epic ECHO 01-01-18 EPIC

## 2018-07-31 ENCOUNTER — Other Ambulatory Visit: Payer: Self-pay

## 2018-07-31 ENCOUNTER — Encounter (HOSPITAL_COMMUNITY)
Admission: RE | Admit: 2018-07-31 | Discharge: 2018-07-31 | Disposition: A | Discharge status: Home / Self Care | Payer: PPO | Source: Ambulatory Visit | Attending: Urology | Admitting: Urology

## 2018-07-31 ENCOUNTER — Encounter (HOSPITAL_COMMUNITY): Payer: Self-pay

## 2018-07-31 DIAGNOSIS — Z8249 Family history of ischemic heart disease and other diseases of the circulatory system: Secondary | ICD-10-CM | POA: Diagnosis not present

## 2018-07-31 DIAGNOSIS — Z951 Presence of aortocoronary bypass graft: Secondary | ICD-10-CM | POA: Diagnosis not present

## 2018-07-31 DIAGNOSIS — Z87442 Personal history of urinary calculi: Secondary | ICD-10-CM | POA: Diagnosis not present

## 2018-07-31 DIAGNOSIS — Z01818 Encounter for other preprocedural examination: Secondary | ICD-10-CM | POA: Insufficient documentation

## 2018-07-31 DIAGNOSIS — N132 Hydronephrosis with renal and ureteral calculous obstruction: Secondary | ICD-10-CM | POA: Diagnosis not present

## 2018-07-31 DIAGNOSIS — R9431 Abnormal electrocardiogram [ECG] [EKG]: Secondary | ICD-10-CM | POA: Insufficient documentation

## 2018-07-31 DIAGNOSIS — E119 Type 2 diabetes mellitus without complications: Secondary | ICD-10-CM | POA: Insufficient documentation

## 2018-07-31 DIAGNOSIS — Z833 Family history of diabetes mellitus: Secondary | ICD-10-CM | POA: Diagnosis not present

## 2018-07-31 DIAGNOSIS — E7800 Pure hypercholesterolemia, unspecified: Secondary | ICD-10-CM | POA: Diagnosis not present

## 2018-07-31 DIAGNOSIS — I491 Atrial premature depolarization: Secondary | ICD-10-CM | POA: Diagnosis not present

## 2018-07-31 DIAGNOSIS — Z79899 Other long term (current) drug therapy: Secondary | ICD-10-CM | POA: Diagnosis not present

## 2018-07-31 DIAGNOSIS — Z823 Family history of stroke: Secondary | ICD-10-CM | POA: Diagnosis not present

## 2018-07-31 DIAGNOSIS — Z9079 Acquired absence of other genital organ(s): Secondary | ICD-10-CM | POA: Diagnosis not present

## 2018-07-31 DIAGNOSIS — I10 Essential (primary) hypertension: Secondary | ICD-10-CM | POA: Insufficient documentation

## 2018-07-31 DIAGNOSIS — I517 Cardiomegaly: Secondary | ICD-10-CM | POA: Insufficient documentation

## 2018-07-31 DIAGNOSIS — H9193 Unspecified hearing loss, bilateral: Secondary | ICD-10-CM | POA: Diagnosis not present

## 2018-07-31 DIAGNOSIS — N201 Calculus of ureter: Secondary | ICD-10-CM | POA: Diagnosis present

## 2018-07-31 DIAGNOSIS — I2510 Atherosclerotic heart disease of native coronary artery without angina pectoris: Secondary | ICD-10-CM | POA: Diagnosis not present

## 2018-07-31 LAB — CBC
HCT: 41.3 % (ref 39.0–52.0)
Hemoglobin: 13.7 g/dL (ref 13.0–17.0)
MCH: 29.8 pg (ref 26.0–34.0)
MCHC: 33.2 g/dL (ref 30.0–36.0)
MCV: 90 fL (ref 80.0–100.0)
Platelets: 244 10*3/uL (ref 150–400)
RBC: 4.59 MIL/uL (ref 4.22–5.81)
RDW: 13 % (ref 11.5–15.5)
WBC: 9 10*3/uL (ref 4.0–10.5)
nRBC: 0 % (ref 0.0–0.2)

## 2018-07-31 LAB — BASIC METABOLIC PANEL
Anion gap: 13 (ref 5–15)
BUN: 55 mg/dL — ABNORMAL HIGH (ref 8–23)
CO2: 22 mmol/L (ref 22–32)
Calcium: 9.3 mg/dL (ref 8.9–10.3)
Chloride: 105 mmol/L (ref 98–111)
Creatinine, Ser: 2.46 mg/dL — ABNORMAL HIGH (ref 0.61–1.24)
GFR calc Af Amer: 30 mL/min — ABNORMAL LOW (ref >60)
GFR calc non Af Amer: 26 mL/min — ABNORMAL LOW (ref >60)
Glucose, Bld: 183 mg/dL — ABNORMAL HIGH (ref 70–99)
Potassium: 4.6 mmol/L (ref 3.5–5.1)
Sodium: 140 mmol/L (ref 135–145)

## 2018-07-31 LAB — HEMOGLOBIN A1C
Hgb A1c MFr Bld: 7.8 % — ABNORMAL HIGH (ref 4.8–5.6)
Mean Plasma Glucose: 177.16 mg/dL

## 2018-07-31 LAB — NOVEL CORONAVIRUS, NAA (HOSP ORDER, SEND-OUT TO REF LAB; TAT 18-24 HRS): SARS-CoV-2, NAA: NOT DETECTED

## 2018-07-31 LAB — GLUCOSE, CAPILLARY: Glucose-Capillary: 166 mg/dL — ABNORMAL HIGH (ref 70–99)

## 2018-07-31 NOTE — Progress Notes (Signed)
Konrad Felix PA   Patient stopped taking ASA 6/15 per dr. Osborne Casco

## 2018-08-01 NOTE — Progress Notes (Cosign Needed)
Anesthesia Chart Review   Case: [id number] Date/Time: 08/02/18 1515   Procedures:      CYSTOSCOPY WITH RETROGRADE PYELOGRAM, URETEROSCOPY AND STENT PLACEMENT (Left )     HOLMIUM LASER APPLICATION (Left )   Anesthesia type: Choice   Pre-op diagnosis: LEFT URETERAL CALCULUS   Location: [place] [facility]   Surgeon: Cleon Gustin, MD      DISCUSSION: 68 yo never smoker with h/o HTN, DM II, CAD (CABG x 4 08/2017), ischemic cardiomyopathy, left ureteral calculus scheduled for above procedure 08/02/2018 with Dr. Nicolette Bang.   Pt last seen by cardiologist, Dr. Allegra Lai, 04/16/2018.  Per note pt pt stable with HTN well controlled, CHF with no signs of volume overload, no chest pain, PVCs which are minimally symptomatic.  No medications changes.    Pt can proceed with planned procedure barring acute status change.  VS: BP 130/66 (BP Location: Right Arm)   Pulse 72   Temp 37.1 C (Oral)   Resp 18   Ht 5\' 11"  (1.803 m)   Wt 98.4 kg   SpO2 97%   BMI 30.27 kg/m   PROVIDERS: Tisovec, Fransico Him, MD is PCP   Allegra Lai, MD is Cardiologist  LABS: Labs reviewed: Acceptable for surgery. (all labs ordered are listed, but only abnormal results are displayed)  Labs Reviewed  HEMOGLOBIN A1C - Abnormal; Notable for the following components:      Result Value   Hgb A1c MFr Bld 7.8 (*)    All other components within normal limits  GLUCOSE, CAPILLARY - Abnormal; Notable for the following components:   Glucose-Capillary 166 (*)    All other components within normal limits  BASIC METABOLIC PANEL - Abnormal; Notable for the following components:   Glucose, Bld 183 (*)    BUN 55 (*)    Creatinine, Ser 2.46 (*)    GFR calc non Af Amer 26 (*)    GFR calc Af Amer 30 (*)    All other components within normal limits  CBC     IMAGES:   EKG: 07/31/2018 Rate 68 bpm Sinus rhythm with short PR with Premature atrial complexes Left ventricular hypertrophy with repolarization  abnormality Abnormal ECG Compared to 01/04/2018, criteria for LVH is noted now along with repolarization abnormality.  CV: Echo 01/01/18 Study Conclusions  - Left ventricle: The cavity size was mildly dilated. Systolic   function was mildly to moderately reduced. The estimated ejection   fraction was in the range of 40% to 45%. Mild diffuse hypokinesis   with no identifiable regional variations. Features are consistent   with a pseudonormal left ventricular filling pattern, with   concomitant abnormal relaxation and increased filling pressure   (grade 2 diastolic dysfunction). Doppler parameters are   consistent with high ventricular filling pressure. - Aortic valve: Trileaflet; normal thickness, moderately calcified   leaflets. - Left atrium: The atrium was severely dilated. - Pulmonic valve: There was mild regurgitation. - Pulmonary arteries: PA peak pressure: 35 mm Hg (S). Past Medical History:  Diagnosis Date  . Bigeminal rhythm 08/01/2017   per EKG 07-24-2017 in epic (per confirmation new since last ecg in 2007)  in care everywhere in epic ecg dated 2012 shows sinus brady rate 53 no pvc's --per pt asymptomatic and   . Bradycardia   . Coronary artery disease involving native coronary artery 08/2017   Abnormal Myoview 08/2017 (Abnormal EKG - Severe ST depressions, PVCs & nsVT). TID noted.  Moderate LAD territory Ischemic Perfusion  defect --> Cath with extensive RCA (RPDA & RPAV), Cx & LAD disease --> CABG x 4  . Family history of premature coronary artery disease    Father - MI 15. Paternal uncle x 2 -1 with MI & other with CABG in 38s  . History of contusion    ED visit 08-04-2013 , 9 days past injury ,  dx concussion w/ LOC, per CT inferior frontal lobes contusion hemarrhage-- residual headache (08-01-2017  per pt residual resolved)  . History of kidney stones   . Hypercholesterolemia   . Hypertension   . Nephrolithiasis    bilateral nonobstructive per CT 07-24-2017  . S/P  CABG x 4 09/06/2017   LIMA to LAD, SVG to OM2, Sequential SVG to PDA and RPL2, EVH via right thigh and leg  . Type 2 diabetes mellitus ([place])    followed by pcp  . Ureteral calculi    left  . Wears glasses   . Wears hearing aid in both ears     Past Surgical History:  Procedure Laterality Date  . CORONARY ARTERY BYPASS GRAFT N/A 09/06/2017   Procedure: CORONARY ARTERY BYPASS GRAFTING (CABG) x 4, using left internal mammary artery and right leg greater saphenous vein harvested endoscopically;  Surgeon: Rexene Alberts, MD;  Location: [facility] OR;  Service: Open Heart Surgery;; LIMA-LAD, SVG-OM2, SeqSVG-RPDA-RPL2  . CYSTOSCOPY/RETROGRADE/URETEROSCOPY Left 08/06/2017   Procedure: CYSTOSCOPY/RETROGRADE/URETEROSCOPY laser and stone basket extrection;  Surgeon: Cleon Gustin, MD;  Location: [facility];  Service: Urology;  Laterality: Left;  . LAPAROSCOPIC APPENDECTOMY  11-18-2005    dr wyatt  [facility]  . LEFT HEART CATH AND CORONARY ANGIOGRAPHY N/A 09/03/2017   Procedure: LEFT HEART CATH AND CORONARY ANGIOGRAPHY;  Surgeon: Leonie Man, MD;  Location: [place] [facility];  Service: Cardiovascular;; mRCA 70%, dRCA 99%-95% into RPAV & 90% ostRPDA 95%. RPL1 70%, distal RVAM (tandem PDA) - 85%. Ost-prox LAD 40% -pLAD 60&90% @ D1 ->p-mLAD 95% @ D2 with 70% D2. pCx 80%, p-mCx 90%-> mCx 85%.  EF 45-50%, global HK. -->> referred for CABG  . NASAL ENDOSCOPY WITH EPISTAXIS CONTROL N/A 11/29/2017   Procedure: NASAL ENDOSCOPY WITH EPISTAXIS CONTROL;  Surgeon: Leta Baptist, MD;  Location: [place];  Service: ENT;  Laterality: N/A;  . NM MYOVIEW LTD  08/31/2017   HIGH RISK: Combination Imaging & EKG:  with  in tmedium sized, moderate severity Reversible perfusion e apdefect in  the mid and apical anterior, apical lateral walls - FINDINGS ARE C/W ISCHEMIA in the LAD territory,  however given significant arrhythmias, severe ST depressions and TID =  MV CAD is highly likely (SDS =7).   .  ORCHIECTOMY  1965   unilateral -- per pt ruptured  . TEE WITHOUT CARDIOVERSION N/A 09/06/2017   Procedure: TRANSESOPHAGEAL ECHOCARDIOGRAM (TEE);  INTRA-OP.  Surgeon: Rexene Alberts, MD;  Location: [facility] OR;  Service: Open Heart Surgery:;; Normal AoV, MV & TV.  Normal RV.  Marland Kitchen TRANSTHORACIC ECHOCARDIOGRAM  09/04/2017   (pre-CABG): EF 40-45%, diffuse HK.  Difficult to assess due to frequent PVCs. Severe LA dilation. Mildly increased PAP.  Marland Kitchen UMBILICAL HERNIA REPAIR  2006  . VARICOSE VEIN SURGERY  2007   left endovascular laser ablation    MEDICATIONS: . aspirin EC 81 MG tablet  . atorvastatin (LIPITOR) 80 MG tablet  . cyclobenzaprine (FLEXERIL) 10 MG tablet  . HYDROcodone-acetaminophen (NORCO/VICODIN) 5-325 MG tablet  . lisinopril (PRINIVIL,ZESTRIL) 10 MG tablet  . metoprolol succinate (TOPROL XL) 25  MG 24 hr tablet  . metoprolol tartrate (LOPRESSOR) 25 MG tablet  . Multiple Vitamin (MULTIVITAMIN) tablet  . sitaGLIPtan-metformin (JANUMET) 50-500 MG per tablet  . tamsulosin (FLOMAX) 0.4 MG CAPS capsule   No current facility-administered medications for this encounter.     Maia Plan [facility] Pre-Surgical Testing [phone] 08/01/18 11:45 AM

## 2018-08-01 NOTE — Anesthesia Preprocedure Evaluation (Addendum)
Anesthesia Evaluation  Patient identified by MRN, date of birth, ID band Patient awake    Reviewed: Allergy & Precautions, NPO status , Patient's Chart, lab work & pertinent test results  Airway Mallampati: II  TM Distance: >3 FB Neck ROM: Full    Dental no notable dental hx.    Pulmonary neg pulmonary ROS,    Pulmonary exam normal breath sounds clear to auscultation       Cardiovascular hypertension, Pt. on medications and Pt. on home beta blockers + CAD and + CABG  negative cardio ROS Normal cardiovascular exam Rhythm:Regular Rate:Normal     Neuro/Psych negative neurological ROS  negative psych ROS   GI/Hepatic negative GI ROS, Neg liver ROS,   Endo/Other  negative endocrine ROSdiabetes, Type 2  Renal/GU Renal InsufficiencyRenal diseasenegative Renal ROS  negative genitourinary   Musculoskeletal negative musculoskeletal ROS (+)   Abdominal   Peds negative pediatric ROS (+)  Hematology negative hematology ROS (+)   Anesthesia Other Findings   Reproductive/Obstetrics negative OB ROS                                                             Anesthesia Evaluation  Patient identified by MRN, date of birth, ID band Patient awake    Reviewed: Allergy & Precautions, NPO status , Patient's Chart, lab work & pertinent test results  Airway Mallampati: II  TM Distance: >3 FB Neck ROM: Full    Dental  (+) Teeth Intact   Pulmonary    breath sounds clear to auscultation       Cardiovascular hypertension, Pt. on medications and Pt. on home beta blockers + CAD   Rhythm:Regular Rate:Normal     Neuro/Psych    GI/Hepatic   Endo/Other  diabetes  Renal/GU      Musculoskeletal   Abdominal   Peds  Hematology   Anesthesia Other Findings   Reproductive/Obstetrics                             Anesthesia Physical  Anesthesia Plan  ASA:  III  Anesthesia Plan: General   Post-op Pain Management:    Induction: Intravenous  PONV Risk Score and Plan: 2 and Ondansetron, Dexamethasone and Treatment may vary due to age or medical condition  Airway Management Planned: LMA and Oral ETT  Additional Equipment: None  Intra-op Plan:   Post-operative Plan: Extubation in OR  Informed Consent: I have reviewed the patients History and Physical, chart, labs and discussed the procedure including the risks, benefits and alternatives for the proposed anesthesia with the patient or authorized representative who has indicated his/her understanding and acceptance.     Plan Discussed with: CRNA, Anesthesiologist and Surgeon  Anesthesia Plan Comments:         Anesthesia Quick Evaluation  Anesthesia Physical Anesthesia Plan  ASA: III  Anesthesia Plan: General   Post-op Pain Management:    Induction: Intravenous  PONV Risk Score and Plan: 2 and Ondansetron and Midazolam  Airway Management Planned: LMA  Additional Equipment:   Intra-op Plan:   Post-operative Plan: Extubation in OR  Informed Consent: I have reviewed the patients History and Physical, chart, labs and discussed the procedure including the risks, benefits and alternatives for the proposed anesthesia with the patient  or authorized representative who has indicated his/her understanding and acceptance.     Dental advisory given  Plan Discussed with: CRNA  Anesthesia Plan Comments: (See PAT note 07/31/2018, Konrad Felix, PA-C)       Anesthesia Quick Evaluation                                  Anesthesia Evaluation  Patient identified by MRN, date of birth, ID band Patient awake    Reviewed: Allergy & Precautions, NPO status , Patient's Chart, lab work & pertinent test results  Airway Mallampati: II  TM Distance: >3 FB Neck ROM: Full    Dental  (+) Teeth Intact   Pulmonary    breath sounds clear to auscultation        Cardiovascular hypertension, Pt. on medications and Pt. on home beta blockers + CAD   Rhythm:Regular Rate:Normal     Neuro/Psych    GI/Hepatic   Endo/Other  diabetes  Renal/GU      Musculoskeletal   Abdominal   Peds  Hematology   Anesthesia Other Findings   Reproductive/Obstetrics                             Anesthesia Physical  Anesthesia Plan  ASA: III  Anesthesia Plan: General   Post-op Pain Management:    Induction: Intravenous  PONV Risk Score and Plan: 2 and Ondansetron, Dexamethasone and Treatment may vary due to age or medical condition  Airway Management Planned: LMA and Oral ETT  Additional Equipment: None  Intra-op Plan:   Post-operative Plan: Extubation in OR  Informed Consent: I have reviewed the patients History and Physical, chart, labs and discussed the procedure including the risks, benefits and alternatives for the proposed anesthesia with the patient or authorized representative who has indicated his/her understanding and acceptance.     Plan Discussed with: CRNA, Anesthesiologist and Surgeon  Anesthesia Plan Comments:         Anesthesia Quick Evaluation

## 2018-08-02 ENCOUNTER — Ambulatory Visit (HOSPITAL_COMMUNITY): Payer: PPO

## 2018-08-02 ENCOUNTER — Ambulatory Visit (HOSPITAL_COMMUNITY): Payer: PPO | Admitting: Anesthesiology

## 2018-08-02 ENCOUNTER — Ambulatory Visit (HOSPITAL_COMMUNITY)
Admission: RE | Admit: 2018-08-02 | Discharge: 2018-08-02 | Disposition: A | Discharge status: Home / Self Care | Payer: PPO | Attending: Urology | Admitting: Urology

## 2018-08-02 ENCOUNTER — Ambulatory Visit (HOSPITAL_COMMUNITY): Payer: PPO | Admitting: Physician Assistant

## 2018-08-02 ENCOUNTER — Encounter (HOSPITAL_COMMUNITY)
Admission: RE | Disposition: A | Discharge status: Home / Self Care | Payer: Self-pay | Source: Home / Self Care | Attending: Urology

## 2018-08-02 ENCOUNTER — Encounter (HOSPITAL_COMMUNITY): Payer: Self-pay | Admitting: *Deleted

## 2018-08-02 DIAGNOSIS — I10 Essential (primary) hypertension: Secondary | ICD-10-CM | POA: Diagnosis not present

## 2018-08-02 DIAGNOSIS — I480 Paroxysmal atrial fibrillation: Secondary | ICD-10-CM | POA: Diagnosis not present

## 2018-08-02 DIAGNOSIS — I2510 Atherosclerotic heart disease of native coronary artery without angina pectoris: Secondary | ICD-10-CM | POA: Diagnosis not present

## 2018-08-02 DIAGNOSIS — N132 Hydronephrosis with renal and ureteral calculous obstruction: Secondary | ICD-10-CM | POA: Diagnosis not present

## 2018-08-02 DIAGNOSIS — Z9079 Acquired absence of other genital organ(s): Secondary | ICD-10-CM | POA: Insufficient documentation

## 2018-08-02 DIAGNOSIS — N201 Calculus of ureter: Secondary | ICD-10-CM | POA: Diagnosis not present

## 2018-08-02 DIAGNOSIS — Z833 Family history of diabetes mellitus: Secondary | ICD-10-CM | POA: Insufficient documentation

## 2018-08-02 DIAGNOSIS — E7800 Pure hypercholesterolemia, unspecified: Secondary | ICD-10-CM | POA: Diagnosis not present

## 2018-08-02 DIAGNOSIS — Z951 Presence of aortocoronary bypass graft: Secondary | ICD-10-CM | POA: Insufficient documentation

## 2018-08-02 DIAGNOSIS — Z79899 Other long term (current) drug therapy: Secondary | ICD-10-CM | POA: Insufficient documentation

## 2018-08-02 DIAGNOSIS — E119 Type 2 diabetes mellitus without complications: Secondary | ICD-10-CM | POA: Diagnosis not present

## 2018-08-02 DIAGNOSIS — Z8249 Family history of ischemic heart disease and other diseases of the circulatory system: Secondary | ICD-10-CM | POA: Insufficient documentation

## 2018-08-02 DIAGNOSIS — Z823 Family history of stroke: Secondary | ICD-10-CM | POA: Insufficient documentation

## 2018-08-02 DIAGNOSIS — I491 Atrial premature depolarization: Secondary | ICD-10-CM | POA: Diagnosis not present

## 2018-08-02 DIAGNOSIS — H9193 Unspecified hearing loss, bilateral: Secondary | ICD-10-CM | POA: Diagnosis not present

## 2018-08-02 DIAGNOSIS — Z87442 Personal history of urinary calculi: Secondary | ICD-10-CM | POA: Diagnosis not present

## 2018-08-02 HISTORY — PX: CYSTOSCOPY WITH RETROGRADE PYELOGRAM, URETEROSCOPY AND STENT PLACEMENT: SHX5789

## 2018-08-02 HISTORY — PX: HOLMIUM LASER APPLICATION: SHX5852

## 2018-08-02 LAB — GLUCOSE, CAPILLARY
Glucose-Capillary: 143 mg/dL — ABNORMAL HIGH (ref 70–99)
Glucose-Capillary: 134 mg/dL — ABNORMAL HIGH (ref 70–99)

## 2018-08-02 SURGERY — CYSTOURETEROSCOPY, WITH RETROGRADE PYELOGRAM AND STENT INSERTION
Anesthesia: General | Site: Ureter | Laterality: Left

## 2018-08-02 MED ORDER — ONDANSETRON HCL 4 MG/2ML IJ SOLN
INTRAMUSCULAR | Status: DC | PRN
  Administered 2018-08-02: 4 mg via INTRAVENOUS

## 2018-08-02 MED ORDER — OXYCODONE HCL 5 MG/5ML PO SOLN: 5.0000 mg | Freq: Once | ORAL | Status: DC | PRN

## 2018-08-02 MED ORDER — OXYCODONE HCL 5 MG PO TABS: 5.0000 mg | ORAL_TABLET | Freq: Once | ORAL | Status: DC | PRN

## 2018-08-02 MED ORDER — ONDANSETRON HCL 4 MG/2ML IJ SOLN
INTRAMUSCULAR | Status: AC
  Filled 2018-08-02: qty 2

## 2018-08-02 MED ORDER — MIDAZOLAM HCL 5 MG/5ML IJ SOLN
INTRAMUSCULAR | Status: DC | PRN
  Administered 2018-08-02: 2 mg via INTRAVENOUS

## 2018-08-02 MED ORDER — FENTANYL CITRATE (PF) 100 MCG/2ML IJ SOLN
INTRAMUSCULAR | Status: AC
  Filled 2018-08-02: qty 2

## 2018-08-02 MED ORDER — PROPOFOL 10 MG/ML IV BOLUS
INTRAVENOUS | Status: DC | PRN
  Administered 2018-08-02: 200 mg via INTRAVENOUS

## 2018-08-02 MED ORDER — LACTATED RINGERS IV SOLN
INTRAVENOUS | Status: DC | PRN
  Administered 2018-08-02: 14:00:00 via INTRAVENOUS

## 2018-08-02 MED ORDER — CEFAZOLIN SODIUM-DEXTROSE 2-4 GM/100ML-% IV SOLN
2.0000 g | INTRAVENOUS | Status: AC
  Administered 2018-08-02: 2 g via INTRAVENOUS
  Filled 2018-08-02: qty 100

## 2018-08-02 MED ORDER — OXYCODONE-ACETAMINOPHEN 5-325 MG PO TABS: 1.0000 | ORAL_TABLET | ORAL | 0 refills | Status: DC | PRN

## 2018-08-02 MED ORDER — LIDOCAINE HCL (CARDIAC) PF 100 MG/5ML IV SOSY
PREFILLED_SYRINGE | INTRAVENOUS | Status: DC | PRN
  Administered 2018-08-02: 100 mg via INTRAVENOUS

## 2018-08-02 MED ORDER — LACTATED RINGERS IV SOLN
INTRAVENOUS | Status: DC
  Administered 2018-08-02: 14:00:00 via INTRAVENOUS

## 2018-08-02 MED ORDER — HYDROMORPHONE HCL 1 MG/ML IJ SOLN: 0.2500 mg | INTRAMUSCULAR | Status: DC | PRN

## 2018-08-02 MED ORDER — FENTANYL CITRATE (PF) 100 MCG/2ML IJ SOLN
INTRAMUSCULAR | Status: DC | PRN
  Administered 2018-08-02: 25 ug via INTRAVENOUS
  Administered 2018-08-02: 50 ug via INTRAVENOUS
  Administered 2018-08-02: 25 ug via INTRAVENOUS

## 2018-08-02 MED ORDER — PROPOFOL 10 MG/ML IV BOLUS
INTRAVENOUS | Status: AC
  Filled 2018-08-02: qty 20

## 2018-08-02 MED ORDER — LACTATED RINGERS IV SOLN: INTRAVENOUS | Status: DC

## 2018-08-02 MED ORDER — MIDAZOLAM HCL 2 MG/2ML IJ SOLN
INTRAMUSCULAR | Status: AC
  Filled 2018-08-02: qty 2

## 2018-08-02 MED ORDER — PROMETHAZINE HCL 25 MG/ML IJ SOLN: 6.2500 mg | INTRAMUSCULAR | Status: DC | PRN

## 2018-08-02 MED ORDER — SODIUM CHLORIDE 0.9 % IR SOLN
Status: DC | PRN
  Administered 2018-08-02: 4000 mL

## 2018-08-02 MED ORDER — IOHEXOL 300 MG/ML  SOLN
INTRAMUSCULAR | Status: DC | PRN
  Administered 2018-08-02: 16:00:00 10 mL via URETHRAL

## 2018-08-02 MED ORDER — LACTATED RINGERS IV SOLN: INTRAVENOUS | Status: DC | PRN

## 2018-08-02 SURGICAL SUPPLY — 24 items
BAG URO CATCHER STRL LF (MISCELLANEOUS) ×3 IMPLANT
CATH INTERMIT  6FR 70CM (CATHETERS) ×3 IMPLANT
CLOTH BEACON ORANGE TIMEOUT ST (SAFETY) ×3 IMPLANT
COVER SURGICAL LIGHT HANDLE (MISCELLANEOUS) ×3 IMPLANT
COVER WAND RF STERILE (DRAPES) IMPLANT
EXTRACTOR STONE NITINOL NGAGE (UROLOGICAL SUPPLIES) ×3 IMPLANT
FIBER LASER FLEXIVA 1000 (UROLOGICAL SUPPLIES) IMPLANT
FIBER LASER FLEXIVA 365 (UROLOGICAL SUPPLIES) ×3 IMPLANT
FIBER LASER FLEXIVA 550 (UROLOGICAL SUPPLIES) IMPLANT
FIBER LASER TRAC TIP (UROLOGICAL SUPPLIES) ×3 IMPLANT
GLOVE BIO SURGEON STRL SZ8 (GLOVE) ×3 IMPLANT
GOWN STRL REUS W/TWL XL LVL3 (GOWN DISPOSABLE) ×3 IMPLANT
GUIDEWIRE ANG ZIPWIRE 038X150 (WIRE) ×3 IMPLANT
GUIDEWIRE STR DUAL SENSOR (WIRE) ×3 IMPLANT
IV NS 1000ML (IV SOLUTION) ×3
IV NS 1000ML BAXH (IV SOLUTION) ×2 IMPLANT
KIT TURNOVER KIT A (KITS) IMPLANT
MANIFOLD NEPTUNE II (INSTRUMENTS) ×3 IMPLANT
PACK CYSTO (CUSTOM PROCEDURE TRAY) ×3 IMPLANT
SHEATH URETERAL 12FRX35CM (MISCELLANEOUS) IMPLANT
STENT URET 6FRX26 CONTOUR (STENTS) IMPLANT
TUBE FEEDING 8FR 16IN STR KANG (MISCELLANEOUS) IMPLANT
TUBING CONNECTING 10 (TUBING) ×3 IMPLANT
TUBING UROLOGY SET (TUBING) ×3 IMPLANT

## 2018-08-02 NOTE — Discharge Instructions (Signed)
Ureteral Stent Implantation, Care After °Refer to this sheet in the next few weeks. These instructions provide you with information about caring for yourself after your procedure. Your health care provider may also give you more specific instructions. Your treatment has been planned according to current medical practices, but problems sometimes occur. Call your health care provider if you have any problems or questions after your procedure. °What can I expect after the procedure? °After the procedure, it is common to have: °· Nausea. °· Mild pain when you urinate. You may feel this pain in your lower back or lower abdomen. Pain should stop within a few minutes after you urinate. This may last for up to 1 week. °· A small amount of blood in your urine for several days. °Follow these instructions at home: ° °Medicines °· Take over-the-counter and prescription medicines only as told by your health care provider. °· If you were prescribed an antibiotic medicine, take it as told by your health care provider. Do not stop taking the antibiotic even if you start to feel better. °· Do not drive for 24 hours if you received a sedative. °· Do not drive or operate heavy machinery while taking prescription pain medicines. °Activity °· Return to your normal activities as told by your health care provider. Ask your health care provider what activities are safe for you. °· Do not lift anything that is heavier than 10 lb (4.5 kg). Follow this limit for 1 week after your procedure, or for as long as told by your health care provider. °General instructions °· Watch for any blood in your urine. Call your health care provider if the amount of blood in your urine increases. °· If you have a catheter: °? Follow instructions from your health care provider about taking care of your catheter and collection bag. °? Do not take baths, swim, or use a hot tub until your health care provider approves. °· Drink enough fluid to keep your urine  clear or pale yellow. °· Keep all follow-up visits as told by your health care provider. This is important. °Contact a health care provider if: °· You have pain that gets worse or does not get better with medicine, especially pain when you urinate. °· You have difficulty urinating. °· You feel nauseous or you vomit repeatedly during a period of more than 2 days after the procedure. °Get help right away if: °· Your urine is dark red or has blood clots in it. °· You are leaking urine (have incontinence). °· The end of the stent comes out of your urethra. °· You cannot urinate. °· You have sudden, sharp, or severe pain in your abdomen or lower back. °· You have a fever. °This information is not intended to replace advice given to you by your health care provider. Make sure you discuss any questions you have with your health care provider. °Document Released: 10/02/2012 Document Revised: 07/08/2015 Document Reviewed: 08/14/2014 °Elsevier Interactive Patient Education © 2019 [employer] ° °

## 2018-08-02 NOTE — Anesthesia Procedure Notes (Signed)
Procedure Name: LMA Insertion Date/Time: 08/02/2018 3:28 PM Performed by: Glory Buff, CRNA Pre-anesthesia Checklist: Patient identified, Emergency Drugs available, Suction available and Patient being monitored Patient Re-evaluated:Patient Re-evaluated prior to induction Oxygen Delivery Method: Circle system utilized Preoxygenation: Pre-oxygenation with 100% oxygen Induction Type: IV induction LMA: LMA inserted LMA Size: 5.0 Number of attempts: 1 Placement Confirmation: positive ETCO2 Tube secured with: Tape Dental Injury: Teeth and Oropharynx as per pre-operative assessment

## 2018-08-02 NOTE — Transfer of Care (Signed)
Immediate Anesthesia Transfer of Care Note  Patient: DELFINO FRIESEN  Procedure(s) Performed: Procedure(s): CYSTOSCOPY WITH RETROGRADE PYELOGRAM, URETEROSCOPY AND STENT PLACEMENT (Left) HOLMIUM LASER APPLICATION (Left)  Patient Location: PACU  Anesthesia Type:General  Level of Consciousness:  sedated, patient cooperative and responds to stimulation  Airway & Oxygen Therapy:Patient Spontanous Breathing and Patient connected to face mask oxgen  Post-op Assessment:  Report given to PACU RN and Post -op Vital signs reviewed and stable  Post vital signs:  Reviewed and stable  Last Vitals:  Vitals:   08/02/18 1319 08/02/18 1630  BP: (!) 161/79 (!) 147/71  Pulse: 75 68  Resp: 18 14  Temp: 36.9 C 36.8 C  SpO2: 444% 619%    Complications: No apparent anesthesia complications

## 2018-08-02 NOTE — Op Note (Signed)
.  Preoperative diagnosis: Left ureteral stone  Postoperative diagnosis: Same  Procedure: 1 cystoscopy 2. Left retrograde pyelography 3.  Intraoperative fluoroscopy, under one hour, with interpretation 4.  Left ureteroscopic stone manipulation with laser lithotripsy 5.  Left 6 x 26 JJ stent placement  Attending: Rosie Fate  Anesthesia: General  Estimated blood loss: None  Drains: Left 6 x 26 JJ ureteral stent without tether  Specimens: stone for analysis  Antibiotics: ancef  Findings: left distal ureteral stone. Moderate hydronephrosis. No masses/lesions in the bladder. Ureteral orifices in normal anatomic location.  Indications: Patient is a 68 year old male with a history of left ureteral calculus who has failed medical expulsive therapy.  After discussing treatment options, he decided proceed with left ureteroscopic stone manipulation.  Procedure her in detail: The patient was brought to the operating room and a brief timeout was done to ensure correct patient, correct procedure, correct site.  General anesthesia was administered patient was placed in dorsal lithotomy position.  His genitalia was then prepped and draped in usual sterile fashion.  A rigid 59 French cystoscope was passed in the urethra and the bladder.  Bladder was inspected free masses or lesions.  the ureteral orifices were in the normal orthotopic locations.  a 6 french ureteral catheter was then instilled into the left ureteral orifice.  a gentle retrograde was obtained and findings noted above.  we then placed a zip wire through the ureteral catheter and advanced up to the renal pelvis.  we then removed the cystoscope and cannulated the left ureteral orifice with a semirigid ureteroscope. We encountered a calculus in the distal ureter. Using a 365 nm laser fiber the calculus was fragmented and the fragments were removed with an NGage basket.  once all stone fragments were removed we then placed a 6 x 26 double-j  ureteral stent over the original zip wire.  We then removed the wire and good coil was noted in the the renal pelvis under fluoroscopy and the bladder under direct vision.  the stone fragments were then removed from the bladder and sent for analysis.   the bladder was then drained and this concluded the procedure which was well tolerated by patient.  Complications: None  Condition: Stable, extubated, transferred to PACU  Plan: Patient is to be discharged home as to follow-up in one week for stent removal.

## 2018-08-02 NOTE — Anesthesia Postprocedure Evaluation (Signed)
Anesthesia Post Note  Patient: TREW SUNDE  Procedure(s) Performed: CYSTOSCOPY WITH RETROGRADE PYELOGRAM, URETEROSCOPY AND STENT PLACEMENT (Left Bladder) HOLMIUM LASER APPLICATION (Left Ureter)     Patient location during evaluation: PACU Anesthesia Type: General Level of consciousness: awake and alert Pain management: pain level controlled Vital Signs Assessment: post-procedure vital signs reviewed and stable Respiratory status: spontaneous breathing, nonlabored ventilation, respiratory function stable and patient connected to nasal cannula oxygen Cardiovascular status: blood pressure returned to baseline and stable Postop Assessment: no apparent nausea or vomiting Anesthetic complications: no    Last Vitals:  Vitals:   08/02/18 1715 08/02/18 1716  BP:  (!) 159/87  Pulse: 68 68  Resp: 16   Temp: (!) 36.4 C   SpO2: 97% 98%    Last Pain:  Vitals:   08/02/18 1715  TempSrc:   PainSc: 0-No pain                 Tiajuana Amass

## 2018-08-02 NOTE — H&P (Signed)
Urology Admission H&P  Chief Complaint: left flank pain  History of Present Illness: Taylor [name] is a 68yo with a hx of nephrolithiasis who developed worsening left flank pain 5 days ago. He underwent CT which showed a 31mm left distal ureteral calculus. No recent fevers/chills/sweats. NO LUTS. Pain is sharp, intermittinent, moderate, and nonradiaitng. No exacerbating/alleviaitng events  Past Medical History:  Diagnosis Date  . Bigeminal rhythm 08/01/2017   per EKG 07-24-2017 in epic (per confirmation new since last ecg in 2007)  in care everywhere in epic ecg dated 2012 shows sinus brady rate 53 no pvc's --per pt asymptomatic and   . Bradycardia   . Coronary artery disease involving native coronary artery 08/2017   Abnormal Myoview 08/2017 (Abnormal EKG - Severe ST depressions, PVCs & nsVT). TID noted.  Moderate LAD territory Ischemic Perfusion defect --> Cath with extensive RCA (RPDA & RPAV), Cx & LAD disease --> CABG x 4  . Family history of premature coronary artery disease    Father - MI 63. Paternal uncle x 2 -1 with MI & other with CABG in 66s  . History of contusion    ED visit 08-04-2013 , 9 days past injury ,  dx concussion w/ LOC, per CT inferior frontal lobes contusion hemarrhage-- residual headache (08-01-2017  per pt residual resolved)  . History of kidney stones   . Hypercholesterolemia   . Hypertension   . Nephrolithiasis    bilateral nonobstructive per CT 07-24-2017  . S/P CABG x 4 09/06/2017   LIMA to LAD, SVG to OM2, Sequential SVG to PDA and RPL2, EVH via right thigh and leg  . Type 2 diabetes mellitus ([place])    followed by pcp  . Ureteral calculi    left  . Wears glasses   . Wears hearing aid in both ears    Past Surgical History:  Procedure Laterality Date  . CORONARY ARTERY BYPASS GRAFT N/A 09/06/2017   Procedure: CORONARY ARTERY BYPASS GRAFTING (CABG) x 4, using left internal mammary artery and right leg greater saphenous vein harvested endoscopically;  Surgeon:  Rexene Alberts, MD;  Location: [facility];  Service: Open Heart Surgery;; LIMA-LAD, SVG-OM2, SeqSVG-RPDA-RPL2  . CYSTOSCOPY/RETROGRADE/URETEROSCOPY Left 08/06/2017   Procedure: CYSTOSCOPY/RETROGRADE/URETEROSCOPY laser and stone basket extrection;  Surgeon: Cleon Gustin, MD;  Location: [facility];  Service: Urology;  Laterality: Left;  . LAPAROSCOPIC APPENDECTOMY  11-18-2005    dr wyatt  [facility]  . LEFT HEART CATH AND CORONARY ANGIOGRAPHY N/A 09/03/2017   Procedure: LEFT HEART CATH AND CORONARY ANGIOGRAPHY;  Surgeon: Leonie Man, MD;  Location: [place] [facility];  Service: Cardiovascular;; mRCA 70%, dRCA 99%-95% into RPAV & 90% ostRPDA 95%. RPL1 70%, distal RVAM (tandem PDA) - 85%. Ost-prox LAD 40% -pLAD 60&90% @ D1 ->p-mLAD 95% @ D2 with 70% D2. pCx 80%, p-mCx 90%-> mCx 85%.  EF 45-50%, global HK. -->> referred for CABG  . NASAL ENDOSCOPY WITH EPISTAXIS CONTROL N/A 11/29/2017   Procedure: NASAL ENDOSCOPY WITH EPISTAXIS CONTROL;  Surgeon: Leta Baptist, MD;  Location: [place];  Service: ENT;  Laterality: N/A;  . NM MYOVIEW LTD  08/31/2017   HIGH RISK: Combination Imaging & EKG:  with  in tmedium sized, moderate severity Reversible perfusion e apdefect in  the mid and apical anterior, apical lateral walls - FINDINGS ARE C/W ISCHEMIA in the LAD territory,  however given significant arrhythmias, severe ST depressions and TID =  MV CAD is highly likely (SDS =7).   Marland Kitchen  ORCHIECTOMY  1965   unilateral -- per pt ruptured  . TEE WITHOUT CARDIOVERSION N/A 09/06/2017   Procedure: TRANSESOPHAGEAL ECHOCARDIOGRAM (TEE);  INTRA-OP.  Surgeon: Rexene Alberts, MD;  Location: [facility] OR;  Service: Open Heart Surgery:;; Normal AoV, MV & TV.  Normal RV.  Marland Kitchen TRANSTHORACIC ECHOCARDIOGRAM  09/04/2017   (pre-CABG): EF 40-45%, diffuse HK.  Difficult to assess due to frequent PVCs. Severe LA dilation. Mildly increased PAP.  Marland Kitchen UMBILICAL HERNIA REPAIR  2006  . VARICOSE VEIN SURGERY  2007   left  endovascular laser ablation    Home Medications:  Current Facility-Administered Medications  Medication Dose Route Frequency Provider Last Rate Last Dose  . ceFAZolin (ANCEF) IVPB 2g/100 mL premix  2 g Intravenous 30 min Pre-Op Genevra Orne, Candee Furbish, MD      . lactated ringers infusion   Intravenous Continuous Lynda Rainwater, MD 50 mL/hr at 08/02/18 1351     Facility-Administered Medications Ordered in Other Encounters  Medication Dose Route Frequency Provider Last Rate Last Dose  . lactated ringers infusion    Continuous PRN Glory Buff, CRNA       Allergies: No Known Allergies  Family History  Problem Relation Age of Onset  . Stroke Mother   . Atrial fibrillation Mother   . Clotting disorder Father   . Heart attack Father 67       Died from complications  . Diabetes Daughter   . Heart disease Other   . Stroke Other   . Heart attack Paternal Uncle        In his 17s  . CAD Paternal Uncle        Has had either bypass surgery or stents   Social History:  reports that he has never smoked. He has never used smokeless tobacco. He reports that he does not drink alcohol or use drugs.  Review of Systems  Genitourinary: Positive for flank pain.  All other systems reviewed and are negative.   Physical Exam:  Vital signs in last 24 hours: Temp:  [98.4 F (36.9 C)] 98.4 F (36.9 C) (06/19 1319) Pulse Rate:  [75] 75 (06/19 1319) Resp:  [18] 18 (06/19 1319) BP: (161)/(79) 161/79 (06/19 1319) SpO2:  [100 %] 100 % (06/19 1319) Physical Exam  Constitutional: He is oriented to person, place, and time. He appears well-developed and well-nourished.  HENT:  Head: Normocephalic and atraumatic.  Eyes: Pupils are equal, round, and reactive to light. EOM are normal.  Neck: Normal range of motion. No thyromegaly present.  Cardiovascular: Normal rate and regular rhythm.  Respiratory: Effort normal. No respiratory distress.  GI: Soft. He exhibits no distension.   Musculoskeletal: Normal range of motion.        General: No edema.  Neurological: He is alert and oriented to person, place, and time.  Skin: Skin is warm and dry.  Psychiatric: He has a normal mood and affect. His behavior is normal. Judgment and thought content normal.    Laboratory Data:  Results for orders placed or performed during the hospital encounter of 08/02/18 (from the past 24 hour(s))  Glucose, capillary     Status: Abnormal   Collection Time: 08/02/18  1:22 PM  Result Value Ref Range   Glucose-Capillary 143 (H) 70 - 99 mg/dL   Recent Results (from the past 240 hour(s))  Novel Coronavirus, NAA (hospital order; send-out to ref lab)     Status: None   Collection Time: 07/30/18  1:43 PM   Specimen: Nasopharyngeal Swab;  Respiratory  Result Value Ref Range Status   SARS-CoV-2, NAA NOT DETECTED NOT DETECTED Final    Comment: (NOTE) This test was developed and its performance characteristics determined by [employer]. This test has not been FDA cleared or approved. This test has been authorized by FDA under an Emergency Use Authorization (EUA). This test is only authorized for the duration of time the declaration that circumstances exist justifying the authorization of the emergency use of in vitro diagnostic tests for detection of SARS-CoV-2 virus and/or diagnosis of COVID-19 infection under section 564(b)(1) of the Act, 21 U.S.C. 721CCE-8(F)(3), unless the authorization is terminated or revoked sooner. When diagnostic testing is negative, the possibility of a false negative result should be considered in the context of a patient's recent exposures and the presence of clinical signs and symptoms consistent with COVID-19. An individual without symptoms of COVID-19 and who is not shedding SARS-CoV-2 virus would expect to have a negative (not detected) result in this assay. Performed  At: [facility] [address] [place], [place] [id number] Rush Farmer MD [place]:[id number]    [place]  Final    Comment: Performed at [facility], [place] [address] [place], [place] [address]   Creatinine: Recent Labs    07/31/18 1007  CREATININE 2.46*   Baseline Creatinine: 2  Impression/Assessment:  68yo with left ureteral calculus  Plan:  The risks/benefits/alternatives to left ureteroscopic stone extraction was explained to the patient and he understands and wishes to proceed with surgery  Nicolette Bang 08/02/2018, 3:16 PM

## 2018-08-03 ENCOUNTER — Encounter (HOSPITAL_COMMUNITY): Payer: Self-pay | Admitting: Urology

## 2018-08-09 DIAGNOSIS — N201 Calculus of ureter: Secondary | ICD-10-CM | POA: Diagnosis not present

## 2018-08-26 ENCOUNTER — Telehealth (INDEPENDENT_AMBULATORY_CARE_PROVIDER_SITE_OTHER): Payer: PPO | Admitting: Thoracic Surgery (Cardiothoracic Vascular Surgery)

## 2018-08-26 ENCOUNTER — Other Ambulatory Visit: Payer: Self-pay

## 2018-08-26 DIAGNOSIS — Z951 Presence of aortocoronary bypass graft: Secondary | ICD-10-CM

## 2018-08-26 DIAGNOSIS — I2510 Atherosclerotic heart disease of native coronary artery without angina pectoris: Secondary | ICD-10-CM | POA: Diagnosis not present

## 2018-08-26 NOTE — Progress Notes (Signed)
[address]       [place] [address]             [phone]     CARDIOTHORACIC SURGERY TELEPHONE VIRTUAL OFFICE NOTE  Primary Cardiologist is [name], MD PCP is Tisovec, Fransico Him, MD   HPI:  I spoke with [name] (DOB Mar 27, 1950 ) via telephone on 08/26/2018 at 2:14 PM and verified that I was speaking with the correct person using more than one form of identification.  We discussed the reason(s) for conducting our visit virtually instead of in-person.  The patient expressed understanding the circumstances and agreed to proceed as described.   Patient is a 68 year old male with coronary artery disease, type 2 diabetes mellitus, hypertension, and hyperlipidemia who returns to the office today for routine follow-up status post coronary artery bypass grafting x4 on September 06, 2017 for severe three-vessel coronary artery disease with abnormal stress test and a single episode of substernal chest discomfort at rest suspicious for angina pectoris.  His early postoperative recovery was uneventful and he was last seen here in our office on as November 12, 2017.  He had palpitations and initially was thought to be having periods of atrial fibrillation but later was found to have likely SVT caused by AV node reentry tachycardia.  This has apparently resolved with medical therapy and patient has not had any recurrent palpitations over the past 6 months.  I spoke with the patient over the telephone today for routine one-year follow-up.  He reports that he is doing very well.  Despite the fact that he was nearly asymptomatic prior to his bypass surgery, he states that now in retrospect he probably was having angina.  He currently reports no physical limitations whatsoever.  With strenuous exertion he experiences no shortness of breath or chest discomfort.  He does admit that in retrospect he probably was having mild chest discomfort with activity prior to surgery.  Overall he  feels noticeably better currently than he did prior to his surgery.  He is very pleased with his outcome.   Current Outpatient Medications  Medication Sig Dispense Refill  . aspirin EC 81 MG tablet Take 1 tablet (81 mg total) by mouth daily. 90 tablet 3  . atorvastatin (LIPITOR) 80 MG tablet Take 1 tablet (80 mg total) by mouth daily at 6 PM. 90 tablet 3  . HYDROcodone-acetaminophen (NORCO/VICODIN) 5-325 MG tablet Take 1 tablet by mouth every 6 (six) hours as needed for pain.    Marland Kitchen lisinopril (PRINIVIL,ZESTRIL) 10 MG tablet TAKE 1 TABLET BY MOUTH EVERY DAY (Patient taking differently: Take 10 mg by mouth daily. ) 30 tablet 5  . metoprolol succinate (TOPROL XL) 25 MG 24 hr tablet Take 1 tablet (25 mg total) by mouth 2 (two) times daily. 180 tablet 3  . metoprolol tartrate (LOPRESSOR) 25 MG tablet Take 1 tablet (25 mg total) by mouth as needed. TAKE FOR PROLONG INCREASE HEART RATE OR PALPATIONS 20 tablet 6  . Multiple Vitamin (MULTIVITAMIN) tablet Take 1 tablet by mouth daily.    Marland Kitchen oxyCODONE-acetaminophen (PERCOCET) 5-325 MG tablet Take 1 tablet by mouth every 4 (four) hours as needed for severe pain. 30 tablet 0  . sitaGLIPtan-metformin (JANUMET) 50-500 MG per tablet Take 1 tablet by mouth 2 (two) times daily with a meal.     . tamsulosin (FLOMAX) 0.4 MG CAPS capsule Take 0.4 mg by mouth daily.     No current facility-administered medications for this  visit.      Diagnostic Tests:  n/a   Impression:  Patient is doing very well approximately 1 year status post coronary artery bypass grafting  Plan:  We have not recommended any change the patient's current medications.  The patient will continue to follow-up regularly with Dr. Ellyn Hack and return to our office in the future only should specific problems or questions arise.  All questions answered.    I discussed limitations of evaluation and management via telephone.  The patient was advised to call back for repeat telephone  consultation or to seek an in-person evaluation if questions arise or the patient's clinical condition changes in any significant manner.  I spent in excess of 5 minutes of non-face-to-face time during the conduct of this telephone virtual office consultation.    Valentina Gu. Roxy Manns, MD 08/26/2018 2:14 PM

## 2018-08-26 NOTE — Patient Instructions (Signed)
Continue all previous medications without any changes at this time  

## 2018-09-23 DIAGNOSIS — N201 Calculus of ureter: Secondary | ICD-10-CM | POA: Diagnosis not present

## 2018-09-26 ENCOUNTER — Other Ambulatory Visit: Payer: Self-pay

## 2018-09-26 MED ORDER — ATORVASTATIN CALCIUM 80 MG PO TABS: 80.0000 mg | ORAL_TABLET | Freq: Every day | ORAL | 1 refills | Status: DC

## 2018-09-30 DIAGNOSIS — L570 Actinic keratosis: Secondary | ICD-10-CM | POA: Diagnosis not present

## 2018-09-30 DIAGNOSIS — B0239 Other herpes zoster eye disease: Secondary | ICD-10-CM | POA: Diagnosis not present

## 2018-09-30 DIAGNOSIS — B029 Zoster without complications: Secondary | ICD-10-CM | POA: Diagnosis not present

## 2018-10-07 ENCOUNTER — Other Ambulatory Visit: Payer: Self-pay | Admitting: Cardiology

## 2018-10-07 DIAGNOSIS — B0239 Other herpes zoster eye disease: Secondary | ICD-10-CM | POA: Diagnosis not present

## 2018-10-07 MED ORDER — LISINOPRIL 10 MG PO TABS: 10.0000 mg | ORAL_TABLET | Freq: Every day | ORAL | 1 refills | Status: DC

## 2018-10-07 NOTE — Telephone Encounter (Signed)
Pt calling requesting a refill on lisinopril sent to [facility] mail order pharmacy. Please address

## 2018-11-05 DIAGNOSIS — L218 Other seborrheic dermatitis: Secondary | ICD-10-CM | POA: Diagnosis not present

## 2018-11-05 DIAGNOSIS — L819 Disorder of pigmentation, unspecified: Secondary | ICD-10-CM | POA: Diagnosis not present

## 2018-11-05 DIAGNOSIS — L570 Actinic keratosis: Secondary | ICD-10-CM | POA: Diagnosis not present

## 2018-11-07 DIAGNOSIS — Z23 Encounter for immunization: Secondary | ICD-10-CM | POA: Diagnosis not present

## 2018-11-11 ENCOUNTER — Other Ambulatory Visit: Payer: Self-pay | Admitting: *Deleted

## 2018-11-11 MED ORDER — LISINOPRIL 10 MG PO TABS: 10.0000 mg | ORAL_TABLET | Freq: Every day | ORAL | 2 refills | Status: DC

## 2018-11-11 NOTE — Telephone Encounter (Signed)
Rx has been sent to the pharmacy electronically. ° °

## 2018-12-17 ENCOUNTER — Ambulatory Visit: Payer: PPO | Admitting: Cardiology

## 2018-12-17 ENCOUNTER — Encounter: Payer: Self-pay | Admitting: Cardiology

## 2018-12-17 ENCOUNTER — Other Ambulatory Visit: Payer: Self-pay

## 2018-12-17 VITALS — BP 134/76 | HR 55 | Ht 71.0 in | Wt 219.2 lb

## 2018-12-17 DIAGNOSIS — I471 Supraventricular tachycardia, unspecified: Secondary | ICD-10-CM

## 2018-12-17 NOTE — Progress Notes (Signed)
Electrophysiology Office Note   Date:  12/17/2018   ID:  CONNOLLY TREZISE, DOB 30-Jul-1950, MRN [id number]  PCP:  Haywood Pao, MD  Cardiologist:  Ellyn Hack Primary Electrophysiologist:  Will Meredith Leeds, MD    No chief complaint on file.    History of Present Illness: Taylor [name] is a 68 y.o. male who is being seen today for the evaluation of PVCs, SVT at the request of [name]. Presenting today for electrophysiology evaluation.  He has a history of coronary disease status post CABG, PVCs with ventricular bigeminy, and SVT.  He has had a few episodes of palpitations.  His first episode was October 22 when he went to the emergency room.  He was at work, felt his heart rate with associated chest tightness and his blood pressure of 170/110.  His heart rate was 160 bpm.  He was also mildly short of breath.  In the emergency room, his heart rate had returned back to normal.  2 days later, he noted his blood pressure and heart rate went up.  This lasted 20 to 30 minutes.  Heart rates were in the 160s.  It was thought that he had atrial fibrillation and thus he was put on anticoagulation.  He he developed epistaxis and had an ENT procedure.  He 5 mg Toprol-XL twice a day.  He has had one short episode of SVT since that time.  Today, denies symptoms of palpitations, chest pain, shortness of breath, orthopnea, PND, lower extremity edema, claudication, dizziness, presyncope, syncope, bleeding, or neurologic sequela. The patient is tolerating medications without difficulties.  Overall he is doing well.  He unfortunately had shingles on his face this summer as well as kidney stones.  He has recovered well from those.  He has no restrictions and is able to do all of his daily activities.   Past Medical History:  Diagnosis Date  . Bigeminal rhythm 08/01/2017   per EKG 07-24-2017 in epic (per confirmation new since last ecg in 2007)  in care everywhere in epic ecg dated 2012 shows sinus  brady rate 53 no pvc's --per pt asymptomatic and   . Bradycardia   . Coronary artery disease involving native coronary artery 08/2017   Abnormal Myoview 08/2017 (Abnormal EKG - Severe ST depressions, PVCs & nsVT). TID noted.  Moderate LAD territory Ischemic Perfusion defect --> Cath with extensive RCA (RPDA & RPAV), Cx & LAD disease --> CABG x 4  . Family history of premature coronary artery disease    Father - MI 49. Paternal uncle x 2 -1 with MI & other with CABG in 30s  . History of contusion    ED visit 08-04-2013 , 9 days past injury ,  dx concussion w/ LOC, per CT inferior frontal lobes contusion hemarrhage-- residual headache (08-01-2017  per pt residual resolved)  . History of kidney stones   . Hypercholesterolemia   . Hypertension   . Nephrolithiasis    bilateral nonobstructive per CT 07-24-2017  . S/P CABG x 4 09/06/2017   LIMA to LAD, SVG to OM2, Sequential SVG to PDA and RPL2, EVH via right thigh and leg  . Type 2 diabetes mellitus ([place])    followed by pcp  . Ureteral calculi    left  . Wears glasses   . Wears hearing aid in both ears    Past Surgical History:  Procedure Laterality Date  . CORONARY ARTERY BYPASS GRAFT N/A 09/06/2017   Procedure: CORONARY ARTERY BYPASS GRAFTING (CABG)  x 4, using left internal mammary artery and right leg greater saphenous vein harvested endoscopically;  Surgeon: Rexene Alberts, MD;  Location: [facility] OR;  Service: Open Heart Surgery;; LIMA-LAD, SVG-OM2, SeqSVG-RPDA-RPL2  . CYSTOSCOPY WITH RETROGRADE PYELOGRAM, URETEROSCOPY AND STENT PLACEMENT Left 08/02/2018   Procedure: CYSTOSCOPY WITH RETROGRADE PYELOGRAM, URETEROSCOPY AND STENT PLACEMENT;  Surgeon: Cleon Gustin, MD;  Location: [facility];  Service: Urology;  Laterality: Left;  . CYSTOSCOPY/RETROGRADE/URETEROSCOPY Left 08/06/2017   Procedure: CYSTOSCOPY/RETROGRADE/URETEROSCOPY laser and stone basket extrection;  Surgeon: Cleon Gustin, MD;  Location: [facility];  Service:  Urology;  Laterality: Left;  . HOLMIUM LASER APPLICATION Left [id number]   Procedure: HOLMIUM LASER APPLICATION;  Surgeon: Cleon Gustin, MD;  Location: [facility];  Service: Urology;  Laterality: Left;  . LAPAROSCOPIC APPENDECTOMY  11-18-2005    dr wyatt  [facility]  . LEFT HEART CATH AND CORONARY ANGIOGRAPHY N/A 09/03/2017   Procedure: LEFT HEART CATH AND CORONARY ANGIOGRAPHY;  Surgeon: Leonie Man, MD;  Location: [place] [facility];  Service: Cardiovascular;; mRCA 70%, dRCA 99%-95% into RPAV & 90% ostRPDA 95%. RPL1 70%, distal RVAM (tandem PDA) - 85%. Ost-prox LAD 40% -pLAD 60&90% @ D1 ->p-mLAD 95% @ D2 with 70% D2. pCx 80%, p-mCx 90%-> mCx 85%.  EF 45-50%, global HK. -->> referred for CABG  . NASAL ENDOSCOPY WITH EPISTAXIS CONTROL N/A 11/29/2017   Procedure: NASAL ENDOSCOPY WITH EPISTAXIS CONTROL;  Surgeon: Leta Baptist, MD;  Location: [facility];  Service: ENT;  Laterality: N/A;  . NM MYOVIEW LTD  08/31/2017   HIGH RISK: Combination Imaging & EKG:  with  in tmedium sized, moderate severity Reversible perfusion e apdefect in  the mid and apical anterior, apical lateral walls - FINDINGS ARE C/W ISCHEMIA in the LAD territory,  however given significant arrhythmias, severe ST depressions and TID =  MV CAD is highly likely (SDS =7).   . ORCHIECTOMY  1965   unilateral -- per pt ruptured  . TEE WITHOUT CARDIOVERSION N/A 09/06/2017   Procedure: TRANSESOPHAGEAL ECHOCARDIOGRAM (TEE);  INTRA-OP.  Surgeon: Rexene Alberts, MD;  Location: [facility] OR;  Service: Open Heart Surgery:;; Normal AoV, MV & TV.  Normal RV.  Marland Kitchen TRANSTHORACIC ECHOCARDIOGRAM  09/04/2017   (pre-CABG): EF 40-45%, diffuse HK.  Difficult to assess due to frequent PVCs. Severe LA dilation. Mildly increased PAP.  Marland Kitchen UMBILICAL HERNIA REPAIR  2006  . VARICOSE VEIN SURGERY  2007   left endovascular laser ablation     Current Outpatient Medications  Medication Sig Dispense Refill  . aspirin EC 81 MG tablet Take 1 tablet (81 mg total)  by mouth daily. 90 tablet 3  . atorvastatin (LIPITOR) 80 MG tablet Take 1 tablet (80 mg total) by mouth daily at 6 PM. 90 tablet 1  . lisinopril (ZESTRIL) 10 MG tablet Take 1 tablet (10 mg total) by mouth daily. 90 tablet 2  . metoprolol succinate (TOPROL XL) 25 MG 24 hr tablet Take 1 tablet (25 mg total) by mouth 2 (two) times daily. 180 tablet 3  . metoprolol tartrate (LOPRESSOR) 25 MG tablet Take 1 tablet (25 mg total) by mouth as needed. TAKE FOR PROLONG INCREASE HEART RATE OR PALPATIONS 20 tablet 6  . Multiple Vitamin (MULTIVITAMIN) tablet Take 1 tablet by mouth daily.    . sitaGLIPtan-metformin (JANUMET) 50-500 MG per tablet Take 1 tablet by mouth 2 (two) times daily with a meal.      No current facility-administered medications for this visit.  Allergies:   Patient has no known allergies.   Social History:  The patient  reports that he has never smoked. He has never used smokeless tobacco. He reports that he does not drink alcohol or use drugs.   Family History:  The patient's family history includes Atrial fibrillation in his mother; CAD in his paternal uncle; Clotting disorder in his father; Diabetes in his daughter; Heart attack in his paternal uncle; Heart attack (age of onset: 29) in his father; Heart disease in an other family member; Stroke in his mother and another family member.   ROS:  Please see the history of present illness.   Otherwise, review of systems is positive for none.   All other systems are reviewed and negative.   PHYSICAL EXAM: VS:  BP 134/76   Pulse (!) 55   Ht 5\' 11"  (1.803 m)   Wt 219 lb 3.2 oz (99.4 kg)   SpO2 95%   BMI 30.57 kg/m  , BMI Body mass index is 30.57 kg/m. GEN: Well nourished, well developed, in no acute distress  HEENT: normal  Neck: no JVD, carotid bruits, or masses Cardiac: RRR; no murmurs, rubs, or gallops,no edema  Respiratory:  clear to auscultation bilaterally, normal work of breathing GI: soft, nontender, nondistended, + BS  MS: no deformity or atrophy  Skin: warm and dry Neuro:  Strength and sensation are intact Psych: euthymic mood, full affect  EKG:  EKG is ordered today. Personal review of the ekg ordered shows this rhythm, rate 55, lateral T wave inversions  Recent Labs: 01/04/2018: Magnesium 1.7 07/31/2018: BUN 55; Creatinine, Ser 2.46; Hemoglobin 13.7; Platelets 244; Potassium 4.6; Sodium 140    Lipid Panel  No results found for: CHOL, TRIG, HDL, CHOLHDL, VLDL, LDLCALC, LDLDIRECT   Wt Readings from Last 3 Encounters:  12/17/18 219 lb 3.2 oz (99.4 kg)  07/31/18 217 lb (98.4 kg)  07/23/18 215 lb (97.5 kg)      Other studies Reviewed: Additional studies/ records that were reviewed today include: TTE 12/01/17  Review of the above records today demonstrates:  - Left ventricle: The cavity size was mildly dilated. Systolic   function was mildly to moderately reduced. The estimated ejection   fraction was in the range of 40% to 45%. Mild diffuse hypokinesis   with no identifiable regional variations. Features are consistent   with a pseudonormal left ventricular filling pattern, with   concomitant abnormal relaxation and increased filling pressure   (grade 2 diastolic dysfunction). Doppler parameters are   consistent with high ventricular filling pressure. - Aortic valve: Trileaflet; normal thickness, moderately calcified   leaflets. - Left atrium: The atrium was severely dilated. - Pulmonic valve: There was mild regurgitation. - Pulmonary arteries: PA peak pressure: 35 mm Hg (S).  Monitor 11/18/17 - personally reviewed  Mostly sinus rhythm. Frequent PVCs (12% burden), rare PACs.  Multiple short runs of 4-5 beats of PVCs/short VT.  Several episodes of short runs SVT with at least 3-4 prolonged runs of SVT (favor SVT over A. fib)  No prolonged ventricular tachycardia.   30-day event monitor shows several episodes of SVT (reported as a fib, but appears to be more related to SVT).  Very  high PVC burden of 12%. Short runs of 4-5 beats VT   ASSESSMENT AND PLAN:  1.  PVCs: 12% burden.  Minimally symptomatic.  2.  SVT: Currently on metoprolol.  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  Has had no further episodes.  No changes.  3.  Hypertension: Mildly elevated today but is better controlled at home.  No changes.  4.  Ischemic cardiomyopathy: Ejection fraction 40 to 45%.  No signs of volume overload.  5.  Coronary artery disease status post CABG: No current chest pain.   Current medicines are reviewed at length with the patient today.   The patient does not have concerns regarding his medicines.  The following changes were made today: None  Labs/ tests ordered today include:  Orders Placed This Encounter  Procedures  . EKG 12-Lead    Disposition:   FU with Will Camnitz 12 months  Signed, Will Meredith Leeds, MD  12/17/2018 10:47 AM     [facility] [address] [place] [address] [phone] (office) [phone] (fax)

## 2018-12-17 NOTE — Patient Instructions (Signed)
Medication Instructions:  Your physician recommends that you continue on your current medications as directed. Please refer to the Current Medication list given to you today.  * If you need a refill on your cardiac medications before your next appointment, please call your pharmacy.   Labwork: None ordered  Testing/Procedures: None ordered  Follow-Up: At [facility], you and your health needs are our priority.  As part of our continuing mission to provide you with exceptional heart care, we have created designated Provider Care Teams.  These Care Teams include your primary Cardiologist (physician) and Advanced Practice Providers (APPs -  Physician Assistants and Nurse Practitioners) who all work together to provide you with the care you need, when you need it.  You will need a follow up appointment in 1 year.  Please call our office 2 months in advance to schedule this appointment.  You may see Dr Curt Bears or one of the following Advanced Practice Providers on your designated Care Team:    Chanetta Marshall, NP  Tommye Standard, PA-C  Oda Kilts, [place]   Thank you for choosing [facility]!!   Trinidad Curet, RN [phone]

## 2018-12-30 DIAGNOSIS — E7800 Pure hypercholesterolemia, unspecified: Secondary | ICD-10-CM | POA: Diagnosis not present

## 2018-12-30 DIAGNOSIS — E1129 Type 2 diabetes mellitus with other diabetic kidney complication: Secondary | ICD-10-CM | POA: Diagnosis not present

## 2018-12-30 DIAGNOSIS — E1165 Type 2 diabetes mellitus with hyperglycemia: Secondary | ICD-10-CM | POA: Diagnosis not present

## 2018-12-30 DIAGNOSIS — E669 Obesity, unspecified: Secondary | ICD-10-CM | POA: Diagnosis not present

## 2018-12-30 DIAGNOSIS — N1832 Chronic kidney disease, stage 3b: Secondary | ICD-10-CM | POA: Diagnosis not present

## 2018-12-30 DIAGNOSIS — I130 Hypertensive heart and chronic kidney disease with heart failure and stage 1 through stage 4 chronic kidney disease, or unspecified chronic kidney disease: Secondary | ICD-10-CM | POA: Diagnosis not present

## 2018-12-30 DIAGNOSIS — I25810 Atherosclerosis of coronary artery bypass graft(s) without angina pectoris: Secondary | ICD-10-CM | POA: Diagnosis not present

## 2018-12-30 DIAGNOSIS — I255 Ischemic cardiomyopathy: Secondary | ICD-10-CM | POA: Diagnosis not present

## 2018-12-30 DIAGNOSIS — I471 Supraventricular tachycardia: Secondary | ICD-10-CM | POA: Diagnosis not present

## 2018-12-30 DIAGNOSIS — R809 Proteinuria, unspecified: Secondary | ICD-10-CM | POA: Diagnosis not present

## 2018-12-31 DIAGNOSIS — I130 Hypertensive heart and chronic kidney disease with heart failure and stage 1 through stage 4 chronic kidney disease, or unspecified chronic kidney disease: Secondary | ICD-10-CM | POA: Diagnosis not present

## 2018-12-31 DIAGNOSIS — E1165 Type 2 diabetes mellitus with hyperglycemia: Secondary | ICD-10-CM | POA: Diagnosis not present

## 2018-12-31 DIAGNOSIS — N183 Chronic kidney disease, stage 3 unspecified: Secondary | ICD-10-CM | POA: Diagnosis not present

## 2019-01-14 ENCOUNTER — Encounter: Payer: Self-pay | Admitting: Cardiology

## 2019-01-14 ENCOUNTER — Ambulatory Visit: Payer: PPO | Admitting: Cardiology

## 2019-01-14 ENCOUNTER — Other Ambulatory Visit: Payer: Self-pay

## 2019-01-14 VITALS — BP 154/77 | HR 68 | Temp 97.2°F | Ht 71.0 in | Wt 219.0 lb

## 2019-01-14 DIAGNOSIS — I498 Other specified cardiac arrhythmias: Secondary | ICD-10-CM

## 2019-01-14 DIAGNOSIS — I471 Supraventricular tachycardia: Secondary | ICD-10-CM

## 2019-01-14 DIAGNOSIS — I10 Essential (primary) hypertension: Secondary | ICD-10-CM | POA: Diagnosis not present

## 2019-01-14 DIAGNOSIS — I2510 Atherosclerotic heart disease of native coronary artery without angina pectoris: Secondary | ICD-10-CM

## 2019-01-14 DIAGNOSIS — E785 Hyperlipidemia, unspecified: Secondary | ICD-10-CM | POA: Diagnosis not present

## 2019-01-14 DIAGNOSIS — E1169 Type 2 diabetes mellitus with other specified complication: Secondary | ICD-10-CM

## 2019-01-14 DIAGNOSIS — I255 Ischemic cardiomyopathy: Secondary | ICD-10-CM | POA: Diagnosis not present

## 2019-01-14 DIAGNOSIS — I493 Ventricular premature depolarization: Secondary | ICD-10-CM

## 2019-01-14 LAB — COMPREHENSIVE METABOLIC PANEL
ALT: 24 IU/L (ref 0–44)
AST: 23 IU/L (ref 0–40)
Albumin/Globulin Ratio: 1.9 (ref 1.2–2.2)
Albumin: 4.6 g/dL (ref 3.8–4.8)
Alkaline Phosphatase: 102 IU/L (ref 39–117)
BUN/Creatinine Ratio: 15 (ref 10–24)
BUN: 19 mg/dL (ref 8–27)
Bilirubin Total: 0.7 mg/dL (ref 0.0–1.2)
CO2: 25 mmol/L (ref 20–29)
Calcium: 9.6 mg/dL (ref 8.6–10.2)
Chloride: 105 mmol/L (ref 96–106)
Creatinine, Ser: 1.26 mg/dL (ref 0.76–1.27)
GFR calc Af Amer: 67 mL/min/{1.73_m2} (ref >59)
GFR calc non Af Amer: 58 mL/min/{1.73_m2} — ABNORMAL LOW (ref >59)
Globulin, Total: 2.4 g/dL (ref 1.5–4.5)
Glucose: 134 mg/dL — ABNORMAL HIGH (ref 65–99)
Potassium: 5.4 mmol/L — ABNORMAL HIGH (ref 3.5–5.2)
Sodium: 140 mmol/L (ref 134–144)
Total Protein: 7 g/dL (ref 6.0–8.5)

## 2019-01-14 LAB — LIPID PANEL
Chol/HDL Ratio: 3 ratio (ref 0.0–5.0)
Cholesterol, Total: 109 mg/dL (ref 100–199)
HDL: 36 mg/dL — ABNORMAL LOW (ref >39)
LDL Chol Calc (NIH): 57 mg/dL (ref 0–99)
Triglycerides: 79 mg/dL (ref 0–149)
VLDL Cholesterol Cal: 16 mg/dL (ref 5–40)

## 2019-01-14 MED ORDER — METOPROLOL SUCCINATE ER 25 MG PO TB24: 25.0000 mg | ORAL_TABLET | Freq: Two times a day (BID) | ORAL | 3 refills | Status: DC

## 2019-01-14 MED ORDER — METOPROLOL TARTRATE 25 MG PO TABS: 25.0000 mg | ORAL_TABLET | ORAL | 6 refills | Status: DC | PRN

## 2019-01-14 NOTE — Progress Notes (Signed)
Primary Care Provider: Haywood Pao, MD Cardiologist: [name], MD Electrophysiologist: Dr. Allegra Lai  Clinic Note: Chief Complaint  Patient presents with  . Follow-up    Annual  . Coronary Artery Disease    -CABG  . Cardiomyopathy    EF 40 and 45%  . Tachycardia    History of PSVT    HPI:    Taylor [name] is a 68 y.o. male with a PMH below who presents today for annual follow-up.  Taylor [name] was initially seen for ventricular bigeminy/bradycardia and a strong family history of CAD.   --> He noted some significant exertional dyspnea and chest tightness. ->  High Risk Myoview with anterior ischemia--Cath w/ MV CAD--> CABG.  -> Postop monitor showed PSVT (initially considered to be A. Fib -> EP evaluation suggested AVNRT.)  Monitor was ordered for follow-up of preop ventricular bigeminy  Taylor [name] was last seen by me back in November 2019.  He was referred to Dr. Allegra Lai from EP and was most recently seen on December 17, 2018 -> no new medication changes.. -> Was walking roughly 1/2 mile per day having just completed cardiac rehab  Recent Hospitalizations: None  Reviewed  CV studies:    The following studies were reviewed today: (if available, images/films reviewed: From Epic Chart or Care Everywhere) . Echo January 01, 2018: EF moderately reduced 40 and 45%.  Diffuse HK.  No identifiable R WMA.  GRII DD with severe LA dilation.  (High filling pressures).  Moderate aortic calcification-aortic sclerosis with no stenosis.  Mildly elevated PA pressures.   Interval History:   Taylor [name] returns here today for annual follow-up doing quite well.  He says he is maybe had 1 or 2 episodes of fast heart rates lasting less than a minute since I last saw him.  He has not had to use any of the as needed beta-blocker.  He says his blood pressure is usually a lot better than it is today.  He had not yet taken his medicines, because he was not eating in order to  potentially get labs drawn.  He denies any recurrent symptoms of chest tightness or pressure or even dyspnea with rest or exertion.  Rarely notes skipped beats.  Only usually in the afternoon when he is settling down after work or at night for goes to bed, but they are not very significant.   CV Review of Symptoms (Summary)positive for - Very brief episodes of fast heart rates x2 in the last year. negative for - chest pain, dyspnea on exertion, irregular heartbeat, orthopnea, paroxysmal nocturnal dyspnea, shortness of breath or Syncope/near-syncope, TIA/amaurosis fugax, claudication   The patient does not have symptoms concerning for COVID-19 infection (fever, chills, cough, or new shortness of breath).  The patient is practicing social distancing. ++ Masking.  He does go out for groceries/shopping.  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  He try to stay away.  He walks a lot while at work   [place] was performed. Review of Systems  Constitutional: Negative for malaise/fatigue and weight loss.  HENT: Negative for congestion and nosebleeds.   Respiratory: Negative for cough, shortness of breath and wheezing.   Gastrointestinal: Negative for blood in stool and melena.  Genitourinary: Negative for hematuria.  Musculoskeletal: Negative  for back pain, falls and joint pain.  Neurological: Negative for dizziness and headaches.  Psychiatric/Behavioral: Negative for depression and memory loss. The patient is not nervous/anxious and does not have insomnia.   All other systems reviewed and are negative.  I have reviewed and (if needed) personally updated the patient's problem list, medications, allergies, past medical and surgical history, social and family history.   PAST MEDICAL HISTORY   Past Medical History:  Diagnosis  Date  . Bigeminal rhythm 08/01/2017   per EKG 07-24-2017 in epic (per confirmation new since last ecg in 2007)  in care everywhere in epic ecg dated 2012 shows sinus brady rate 53 no pvc's --per pt asymptomatic and   . Bradycardia   . Coronary artery disease involving native coronary artery 08/2017   Abnormal Myoview 08/2017 (Abnormal EKG - Severe ST depressions, PVCs & nsVT). TID noted.  Moderate LAD territory Ischemic Perfusion defect --> Cath with extensive RCA (RPDA & RPAV), Cx & LAD disease --> CABG x 4  . Family history of premature coronary artery disease    Father - MI 51. Paternal uncle x 2 -1 with MI & other with CABG in 62s  . History of contusion    ED visit 08-04-2013 , 9 days past injury ,  dx concussion w/ LOC, per CT inferior frontal lobes contusion hemarrhage-- residual headache (08-01-2017  per pt residual resolved)  . History of kidney stones   . Hypercholesterolemia   . Hypertension   . Nephrolithiasis    bilateral nonobstructive per CT 07-24-2017  . S/P CABG x 4 09/06/2017   LIMA to LAD, SVG to OM2, Sequential SVG to PDA and RPL2, EVH via right thigh and leg  . Type 2 diabetes mellitus ([place])    followed by pcp  . Ureteral calculi    left  . Wears glasses   . Wears hearing aid in both ears      PAST SURGICAL HISTORY   Past Surgical History:  Procedure Laterality Date  . CORONARY ARTERY BYPASS GRAFT N/A 09/06/2017   Procedure: CORONARY ARTERY BYPASS GRAFTING (CABG) x 4, using left internal mammary artery and right leg greater saphenous vein harvested endoscopically;  Surgeon: Rexene Alberts, MD;  Location: [facility];  Service: Open Heart Surgery;; LIMA-LAD, SVG-OM2, SeqSVG-RPDA-RPL2  . CYSTOSCOPY WITH RETROGRADE PYELOGRAM, URETEROSCOPY AND STENT PLACEMENT Left 08/02/2018   Procedure: CYSTOSCOPY WITH RETROGRADE PYELOGRAM, URETEROSCOPY AND STENT PLACEMENT;  Surgeon: Cleon Gustin, MD;  Location: [facility];  Service: Urology;  Laterality: Left;  .  CYSTOSCOPY/RETROGRADE/URETEROSCOPY Left 08/06/2017   Procedure: CYSTOSCOPY/RETROGRADE/URETEROSCOPY laser and stone basket extrection;  Surgeon: Cleon Gustin, MD;  Location: [facility];  Service: Urology;  Laterality: Left;  . HOLMIUM LASER APPLICATION Left [id number]   Procedure: HOLMIUM LASER APPLICATION;  Surgeon: Cleon Gustin, MD;  Location: [facility];  Service: Urology;  Laterality: Left;  . LAPAROSCOPIC APPENDECTOMY  11-18-2005    dr wyatt  [facility]  . LEFT HEART CATH AND CORONARY ANGIOGRAPHY N/A 09/03/2017   Procedure: LEFT HEART CATH AND CORONARY ANGIOGRAPHY;  Surgeon: Leonie Man, MD;  Location: [facility];  Service: Cardiovascular;; mRCA 70%, dRCA 99%-95% into RPAV & 90% ostRPDA 95%. RPL1 70%, distal RVAM (tandem PDA) - 85%. Ost-prox LAD 40% -pLAD 60&90% @ D1 ->p-mLAD 95% @ D2 with 70% D2. pCx 80%, p-mCx 90%-> mCx 85%.  EF 45-50%, global HK. -->> referred for CABG  . NASAL ENDOSCOPY WITH EPISTAXIS CONTROL N/A 11/29/2017   Procedure: NASAL  ENDOSCOPY WITH EPISTAXIS CONTROL;  Surgeon: Leta Baptist, MD;  Location: [place];  Service: ENT;  Laterality: N/A;  . NM MYOVIEW LTD  08/31/2017   HIGH RISK: Combination Imaging & EKG:  with  in tmedium sized, moderate severity Reversible perfusion e apdefect in  the mid and apical anterior, apical lateral walls - FINDINGS ARE C/W ISCHEMIA in the LAD territory,  however given significant arrhythmias, severe ST depressions and TID =  MV CAD is highly likely (SDS =7).   . ORCHIECTOMY  1965   unilateral -- per pt ruptured  . TEE WITHOUT CARDIOVERSION N/A 09/06/2017   Procedure: TRANSESOPHAGEAL ECHOCARDIOGRAM (TEE);  INTRA-OP.  Surgeon: Rexene Alberts, MD;  [facility];  Service: Open Heart Surgery:;; Normal AoV, MV & TV.  Normal RV.  Marland Kitchen TRANSTHORACIC ECHOCARDIOGRAM  09/04/2017   (pre-CABG): EF 40-45%, diffuse HK.  Difficult to assess due to frequent PVCs. Severe LA dilation. Mildly increased PAP.  Marland Kitchen  TRANSTHORACIC ECHOCARDIOGRAM  01/01/2018   Post CABG: EF moderately reduced 40 and 45%.  Diffuse HK.  No identifiable R WMA.  GRII DD with severe LA dilation.  (High filling pressures).  Moderate aortic calcification-aortic sclerosis with no stenosis.  Mildly elevated PA pressures.  Marland Kitchen UMBILICAL HERNIA REPAIR  2006  . VARICOSE VEIN SURGERY  2007   left endovascular laser ablation     MEDICATIONS/ALLERGIES   Current Meds  Medication Sig  . aspirin EC 81 MG tablet Take 1 tablet (81 mg total) by mouth daily.  Marland Kitchen atorvastatin (LIPITOR) 80 MG tablet Take 1 tablet (80 mg total) by mouth daily at 6 PM.  . lisinopril (ZESTRIL) 10 MG tablet Take 1 tablet (10 mg total) by mouth daily.  . metFORMIN (GLUCOPHAGE) 500 MG tablet Take 500 mg by mouth 2 (two) times daily.  . metoprolol succinate (TOPROL XL) 25 MG 24 hr tablet Take 1 tablet (25 mg total) by mouth 2 (two) times daily.  . metoprolol tartrate (LOPRESSOR) 25 MG tablet Take 1 tablet (25 mg total) by mouth as needed. TAKE FOR PROLONG INCREASE HEART RATE OR PALPATIONS  . Multiple Vitamin (MULTIVITAMIN) tablet Take 1 tablet by mouth daily.  . sitaGLIPtan-metformin (JANUMET) 50-500 MG per tablet Take 1 tablet by mouth 2 (two) times daily with a meal.   . TRULICITY 1.5 0000000 SOPN   . [DISCONTINUED] metoprolol succinate (TOPROL XL) 25 MG 24 hr tablet Take 1 tablet (25 mg total) by mouth 2 (two) times daily.  . [DISCONTINUED] metoprolol tartrate (LOPRESSOR) 25 MG tablet Take 1 tablet (25 mg total) by mouth as needed. TAKE FOR PROLONG INCREASE HEART RATE OR PALPATIONS (Patient taking differently: Take 25 mg by mouth as needed. TAKE FOR PROLONG INCREASE HEART RATE OR PALPATIONS PRN)    No Known Allergies   SOCIAL HISTORY/FAMILY HISTORY   Social History   Tobacco Use  . Smoking status: Never Smoker  . Smokeless tobacco: Never Used  Substance Use Topics  . Alcohol use: No  . Drug use: No   Social History   Social History Narrative   married  and lives locally in [place] with his family.     He has 2 adult children and several grandchildren.     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     Family History: F - MI & died @ 43, P Uncle x 2 with CAD - one died, M - PAF family history includes Atrial fibrillation in his  mother; CAD in his paternal uncle; Clotting disorder in his father; Diabetes in his daughter; Heart attack in his paternal uncle; Heart attack (age of onset: 18) in his father; Heart disease in an other family member; Stroke in his mother and another family member.   OBJCTIVE -PE, EKG, labs   Wt Readings from Last 3 Encounters:  01/14/19 219 lb (99.3 kg)  12/17/18 219 lb 3.2 oz (99.4 kg)  07/31/18 217 lb (98.4 kg)    Physical Exam: BP (!) 154/77   Pulse 68   Temp (!) 97.2 F (36.2 C)   Ht 5\' 11"  (1.803 m)   Wt 219 lb (99.3 kg)   SpO2 97%   BMI 30.54 kg/m  Physical Exam  Constitutional: He is oriented to person, place, and time. He appears well-developed and well-nourished. No distress.  Healthy-appearing.  Well-groomed.  Mild truncal obesity  HENT:  Head: Normocephalic.  Eyes: EOM are normal.  Neck: Normal range of motion. Neck supple. No hepatojugular reflux and no JVD present. Carotid bruit is not present.  Cardiovascular: Normal rate, regular rhythm, normal heart sounds and intact distal pulses.  No extrasystoles are present. Exam reveals no gallop and no friction rub.  No murmur heard. Pulmonary/Chest: Effort normal and breath sounds normal. No respiratory distress. He has no wheezes. He has no rales.  Abdominal: Soft. Bowel sounds are normal. He exhibits no distension. There is no abdominal tenderness. There is no rebound.  No HSM  Musculoskeletal: Normal range of motion.        General: No edema.  Neurological: He is alert and oriented to person, place, and time.  Skin: He is not diaphoretic.  Psychiatric: He has a normal mood and affect.  His behavior is normal. Judgment and thought content normal.  Vitals reviewed.    Adult ECG Report N/A  Recent Labs:  May 2020([facility]) TC 126, TG 68, HDL 33, LDL 80. A1c November 2020: 7.7. --> Labs ordered today, updated Lab Results  Component Value Date   CHOL 109 01/14/2019   HDL 36 (L) 01/14/2019   LDLCALC 57 01/14/2019   TRIG 79 01/14/2019   CHOLHDL 3.0 01/14/2019   Lab Results  Component Value Date   CREATININE 1.26 01/14/2019   BUN 19 01/14/2019   NA 140 01/14/2019   K 5.4 (H) 01/14/2019   CL 105 01/14/2019   CO2 25 01/14/2019    ASSESSMENT/PLAN    Problem List Items Addressed This Visit    Ventricular bigeminy (Chronic)    Relatively asymptomatic now as far as PVCs go.  Still had PVCs after CABG.  Also had a burst of SVT.  Is now currently on twice daily Toprol and seems to be well controlled. No plans for antiarrhythmic with no recurrent SVT.      Relevant Medications   metoprolol succinate (TOPROL XL) 25 MG 24 hr tablet   metoprolol tartrate (LOPRESSOR) 25 MG tablet   Frequent unifocal PVCs (Chronic)    On high-dose Toprol.  Somewhat stable.      Relevant Medications   metoprolol succinate (TOPROL XL) 25 MG 24 hr tablet   metoprolol tartrate (LOPRESSOR) 25 MG tablet   Coronary artery disease involving native coronary artery of native heart without angina pectoris - Primary (Chronic)    Very significant CAD now status post CABG.  On aspirin statin beta-blocker and ACE inhibitor. Also on GLP-1 agonist and Metformin  No active angina.  Stable EF.      Relevant Medications   metoprolol succinate (  TOPROL XL) 25 MG 24 hr tablet   metoprolol tartrate (LOPRESSOR) 25 MG tablet   Other Relevant Orders   Lipid panel (Completed)   Comprehensive metabolic panel (Completed)   Ischemic cardiomyopathy - pre-CABG ECHO: 40-45% (Chronic)    Unfortunately, his EF stayed relatively the same despite having CABG.  Thankfully, he is not showing any signs or symptoms of  heart failure.  Euvolemic on exam.  No significant edema.  He is on Toprol and lisinopril.  No indication as yet for diuretic.      Relevant Medications   metoprolol succinate (TOPROL XL) 25 MG 24 hr tablet   metoprolol tartrate (LOPRESSOR) 25 MG tablet   Other Relevant Orders   Lipid panel (Completed)   Comprehensive metabolic panel (Completed)   Hyperlipidemia associated with type 2 diabetes mellitus (HCC) (Chronic)    On 80 mg atorvastatin.  LDL is 57.  We will continue current dose for now, but would be okay with reducing to 40 mg if there was concern of side effects such as myalgias, fatigue or memory issues.  On combination of Metformin-sitagliptin plus Trulicity (GLP 1 0000000 does not seem at goal.  Defer to PCP, but consider SGLT2 inhibitor.      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  Well-controlled on current dose of twice daily Toprol.  He said he had 2 brief episodes that could have been brief episodes of SVT but not enough to cause significant symptoms in the last year. He has as needed beta-blocker.  We also discussed vagal maneuvers.  Not unexpectedly, after referred to electrophysiology, he did not have any further symptoms.      Relevant Medications   metoprolol succinate (TOPROL XL) 25 MG 24 hr tablet   metoprolol tartrate (LOPRESSOR) 25 MG tablet   Other Relevant Orders   Comprehensive metabolic panel (Completed)   Essential hypertension (Chronic)    Blood pressure is little high today, but he says at home it is usually well controlled.  Monitor over the next year with PCP, if it does seem to be elevated, would probably increase ACE inhibitor dose to 20 mg.  Otherwise continue current dose of Toprol.      Relevant Medications   metoprolol succinate (TOPROL XL) 25  MG 24 hr tablet   metoprolol tartrate (LOPRESSOR) 25 MG tablet       COVID-19 Education: The signs and symptoms of COVID-19 were discussed with the patient and how to seek care for testing (follow up with PCP or arrange E-visit).   The importance of social distancing was discussed today.  I spent a total of 86minutes with the patient and chart review. >  50% of the time was spent in direct patient consultation.  Additional time spent with chart review (studies, outside notes, etc): 6 Total Time: 23 min   Current medicines are reviewed at length with the patient today.  (+/- concerns) n/a   Patient Instructions / Medication Changes & Studies & Tests Ordered   Patient Instructions  Medication Instructions:   continue current medications *If you need a refill on your cardiac medications before your next appointment, please call your pharmacy*  Lab Work: Lipids cmp -today  If you have labs (blood work) drawn today and your tests are completely normal, you will receive your results  only by: Marland Kitchen MyChart Message (if you have MyChart) OR . A paper copy in the mail If you have any lab test that is abnormal or we need to change your treatment, we will call you to review the results.  Testing/Procedures: Not needed  Follow-Up: At [facility], you and your health needs are our priority.  As part of our continuing mission to provide you with exceptional heart care, we have created designated Provider Care Teams.  These Care Teams include your primary Cardiologist (physician) and Advanced Practice Providers (APPs -  Physician Assistants and Nurse Practitioners) who all work together to provide you with the care you need, when you need it.  Your next appointment:   5 month(s)- May 2021  The format for your next appointment:   In Person  Provider:   [name], MD       Studies Ordered:   Orders Placed This Encounter  Procedures  . Lipid panel  . Comprehensive metabolic  panel     [name], M.D., M.S. Interventional Cardiologist   Pager # [phone] Phone # [phone] [address]. [place], [place] [address]   Thank you for choosing Heartcare at [facility]!!

## 2019-01-14 NOTE — Patient Instructions (Addendum)
Medication Instructions:   continue current medications *If you need a refill on your cardiac medications before your next appointment, please call your pharmacy*  Lab Work: Lipids cmp -today  If you have labs (blood work) drawn today and your tests are completely normal, you will receive your results only by: Marland Kitchen MyChart Message (if you have MyChart) OR . A paper copy in the mail If you have any lab test that is abnormal or we need to change your treatment, we will call you to review the results.  Testing/Procedures: Not needed  Follow-Up: At [facility], you and your health needs are our priority.  As part of our continuing mission to provide you with exceptional heart care, we have created designated Provider Care Teams.  These Care Teams include your primary Cardiologist (physician) and Advanced Practice Providers (APPs -  Physician Assistants and Nurse Practitioners) who all work together to provide you with the care you need, when you need it.  Your next appointment:   5 month(s)- May 2021  The format for your next appointment:   In Person  Provider:   [name], MD

## 2019-01-15 ENCOUNTER — Encounter: Payer: Self-pay | Admitting: Cardiology

## 2019-01-15 NOTE — Assessment & Plan Note (Signed)
Very significant CAD now status post CABG.  On aspirin statin beta-blocker and ACE inhibitor. Also on GLP-1 agonist and Metformin  No active angina.  Stable EF.

## 2019-01-15 NOTE — Assessment & Plan Note (Signed)
On high-dose Toprol.  Somewhat stable.

## 2019-01-15 NOTE — Assessment & Plan Note (Signed)
Unfortunately, his EF stayed relatively the same despite having CABG.  Thankfully, he is not showing any signs or symptoms of heart failure.  Euvolemic on exam.  No significant edema.  He is on Toprol and lisinopril.  No indication as yet for diuretic.

## 2019-01-15 NOTE — Assessment & Plan Note (Signed)
Relatively asymptomatic now as far as PVCs go.  Still had PVCs after CABG.  Also had a burst of SVT.  Is now currently on twice daily Toprol and seems to be well controlled. No plans for antiarrhythmic with no recurrent SVT.

## 2019-01-15 NOTE — Assessment & Plan Note (Signed)
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  Well-controlled on current dose of twice daily Toprol.  He said he had 2 brief episodes that could have been brief episodes of SVT but not enough to cause significant symptoms in the last year. He has as needed beta-blocker.  We also discussed vagal maneuvers.  Not unexpectedly, after referred to electrophysiology, he did not have any further symptoms.

## 2019-01-15 NOTE — Assessment & Plan Note (Addendum)
On 80 mg atorvastatin.  LDL is 57.  We will continue current dose for now, but would be okay with reducing to 40 mg if there was concern of side effects such as myalgias, fatigue or memory issues.  On combination of Metformin-sitagliptin plus Trulicity (GLP 1 0000000 does not seem at goal.  Defer to PCP, but consider SGLT2 inhibitor.

## 2019-01-15 NOTE — Assessment & Plan Note (Signed)
Blood pressure is little high today, but he says at home it is usually well controlled.  Monitor over the next year with PCP, if it does seem to be elevated, would probably increase ACE inhibitor dose to 20 mg.  Otherwise continue current dose of Toprol.

## 2019-01-16 ENCOUNTER — Encounter: Payer: Self-pay | Admitting: Cardiology

## 2019-01-16 ENCOUNTER — Telehealth: Payer: Self-pay | Admitting: Cardiology

## 2019-01-16 NOTE — Telephone Encounter (Signed)
error 

## 2019-01-16 NOTE — Telephone Encounter (Signed)
Reference number for Arbie Cookey at [place] [id number]

## 2019-01-16 NOTE — Telephone Encounter (Addendum)
CALLED AN SPOKE TO MAIL ORDER- PATIENt IS  TAKING  METOPROLOL TARTARTE AS A NEEDED MEDICATION.  SUCCINATE TAKING DAILY.

## 2019-01-16 NOTE — Telephone Encounter (Signed)
Taylor [name] from [facility] order calling because the medication metoprolol succinate (TOPROL XL) 25 MG 24 hr tablet is being rejected. States that a prescription for metoprolol tartrate (LOPRESSOR) 25 MG tablet was sent into [facility] and this is causing the rejection. She states she will need a verbal order from Dr. Ellyn Hack to override the prescription.

## 2019-02-03 ENCOUNTER — Other Ambulatory Visit: Payer: Self-pay | Admitting: Cardiology

## 2019-02-11 ENCOUNTER — Other Ambulatory Visit: Payer: Self-pay | Admitting: *Deleted

## 2019-02-11 DIAGNOSIS — I130 Hypertensive heart and chronic kidney disease with heart failure and stage 1 through stage 4 chronic kidney disease, or unspecified chronic kidney disease: Secondary | ICD-10-CM | POA: Diagnosis not present

## 2019-02-11 DIAGNOSIS — N1832 Chronic kidney disease, stage 3b: Secondary | ICD-10-CM | POA: Diagnosis not present

## 2019-02-11 DIAGNOSIS — E7800 Pure hypercholesterolemia, unspecified: Secondary | ICD-10-CM | POA: Diagnosis not present

## 2019-02-11 DIAGNOSIS — E1165 Type 2 diabetes mellitus with hyperglycemia: Secondary | ICD-10-CM | POA: Diagnosis not present

## 2019-02-11 MED ORDER — LISINOPRIL 10 MG PO TABS: 10.0000 mg | ORAL_TABLET | Freq: Every day | ORAL | 3 refills | Status: DC

## 2019-02-11 NOTE — Telephone Encounter (Signed)
Received message ( letter ) from  [facility] order that pattient  needed refill lisinopril 10 mg.  order completed and e-sent.

## 2019-03-03 ENCOUNTER — Other Ambulatory Visit: Payer: Self-pay

## 2019-03-04 ENCOUNTER — Other Ambulatory Visit: Payer: Self-pay

## 2019-03-04 MED ORDER — LISINOPRIL 10 MG PO TABS: 10.0000 mg | ORAL_TABLET | Freq: Every day | ORAL | 3 refills | Status: DC

## 2019-03-10 DIAGNOSIS — L853 Xerosis cutis: Secondary | ICD-10-CM | POA: Diagnosis not present

## 2019-03-10 DIAGNOSIS — D1801 Hemangioma of skin and subcutaneous tissue: Secondary | ICD-10-CM | POA: Diagnosis not present

## 2019-03-10 DIAGNOSIS — L821 Other seborrheic keratosis: Secondary | ICD-10-CM | POA: Diagnosis not present

## 2019-03-10 DIAGNOSIS — L570 Actinic keratosis: Secondary | ICD-10-CM | POA: Diagnosis not present

## 2019-03-10 DIAGNOSIS — D229 Melanocytic nevi, unspecified: Secondary | ICD-10-CM | POA: Diagnosis not present

## 2019-03-10 DIAGNOSIS — L814 Other melanin hyperpigmentation: Secondary | ICD-10-CM | POA: Diagnosis not present

## 2019-03-10 DIAGNOSIS — L819 Disorder of pigmentation, unspecified: Secondary | ICD-10-CM | POA: Diagnosis not present

## 2019-03-24 ENCOUNTER — Other Ambulatory Visit: Payer: Self-pay

## 2019-03-24 MED ORDER — METOPROLOL SUCCINATE ER 25 MG PO TB24: 25.0000 mg | ORAL_TABLET | Freq: Two times a day (BID) | ORAL | 1 refills | Status: DC

## 2019-05-10 IMAGING — DX DG CHEST 1V PORT
1 series · 1 of 1 positions shown · non-contrast
Comparison: 09/06/2017

CLINICAL DATA: CABG

EXAM:
PORTABLE CHEST 1 VIEW

[chest]
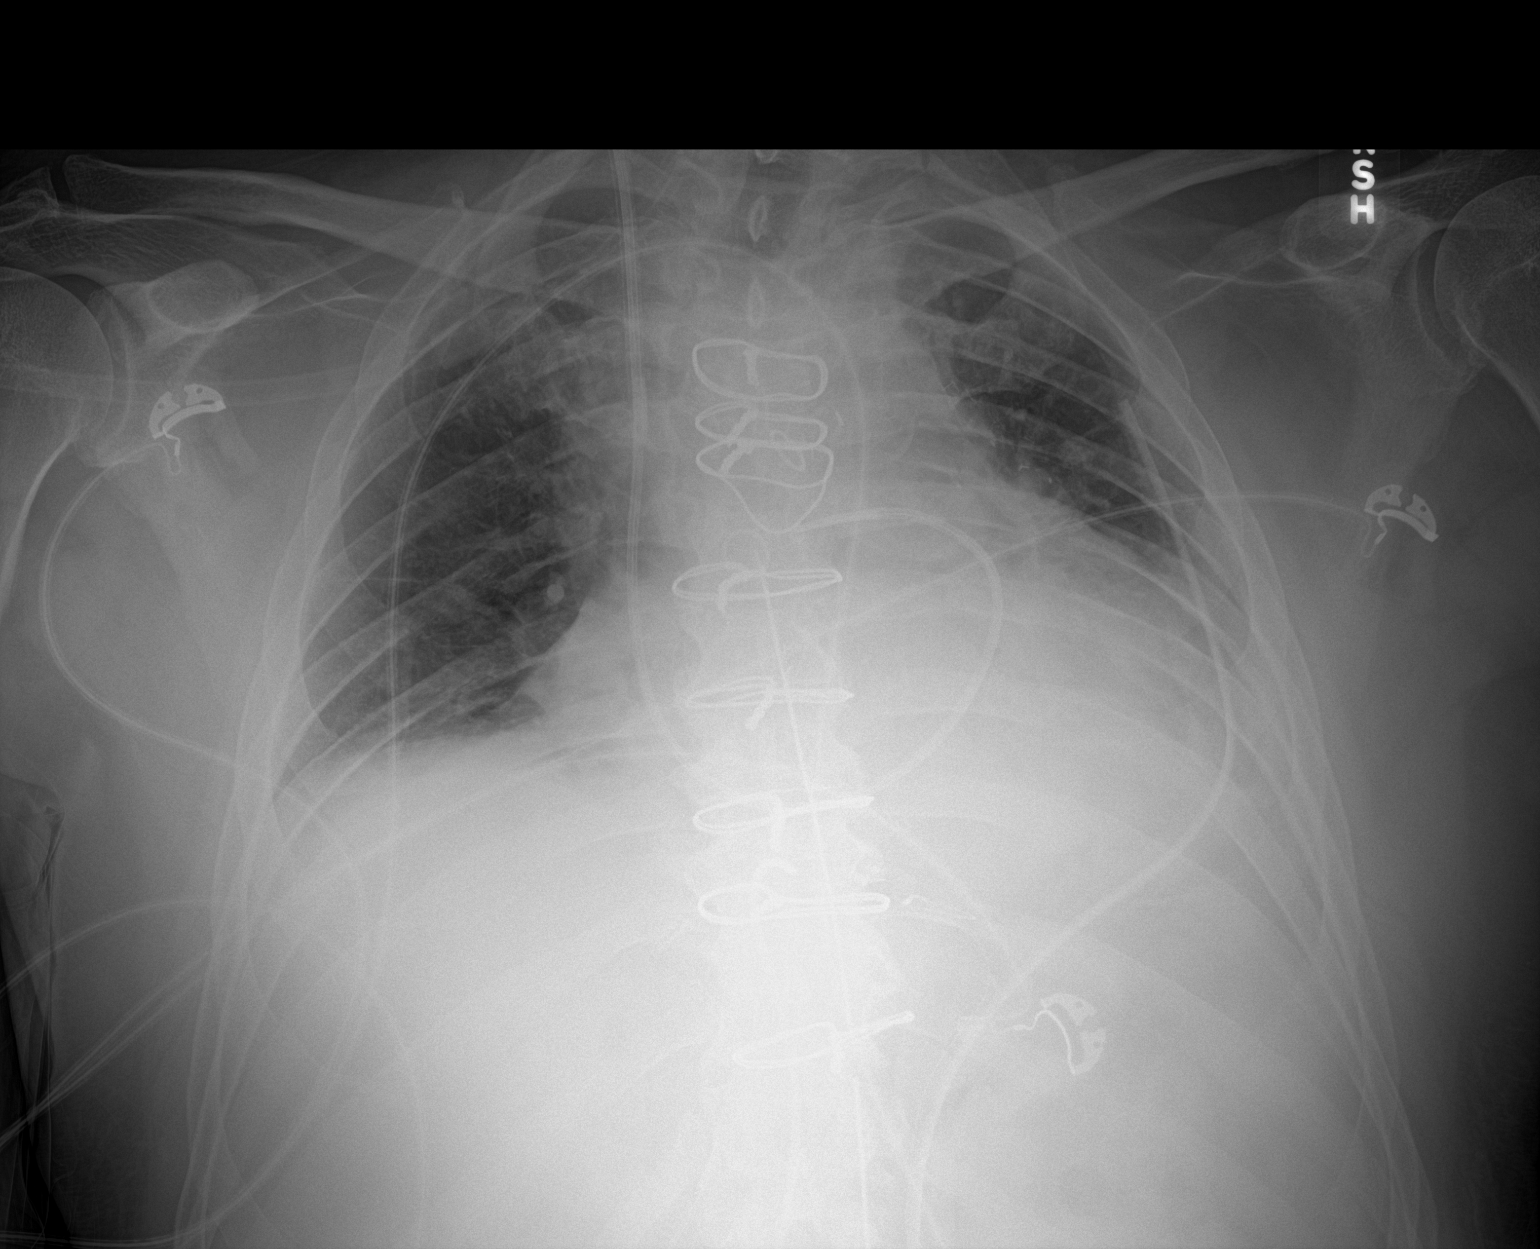

[1 of 1 positions shown; findings below may reference images not displayed]

FINDINGS: Endotracheal tube removed. NG tube removed. Swan-Ganz catheter tip
right pulmonary artery. Mediastinal drain in place. Left chest tube
in place.

Negative for pneumothorax. Hypoventilation with progression of
bibasilar atelectasis left greater than right. Small bilateral
pleural effusions.
IMPRESSION: Endotracheal tube and NG tube removed.

Hypoventilation with progression of bibasilar airspace disease left
greater than right. No pneumothorax.

## 2019-05-11 IMAGING — DX DG CHEST 1V PORT
1 series · 1 of 1 positions shown · non-contrast
Comparison: One-view chest x-ray 09/07/2017

CLINICAL DATA: Atelectasis.  CABG 2 days ago.

EXAM:
PORTABLE CHEST 1 VIEW

[chest ap]
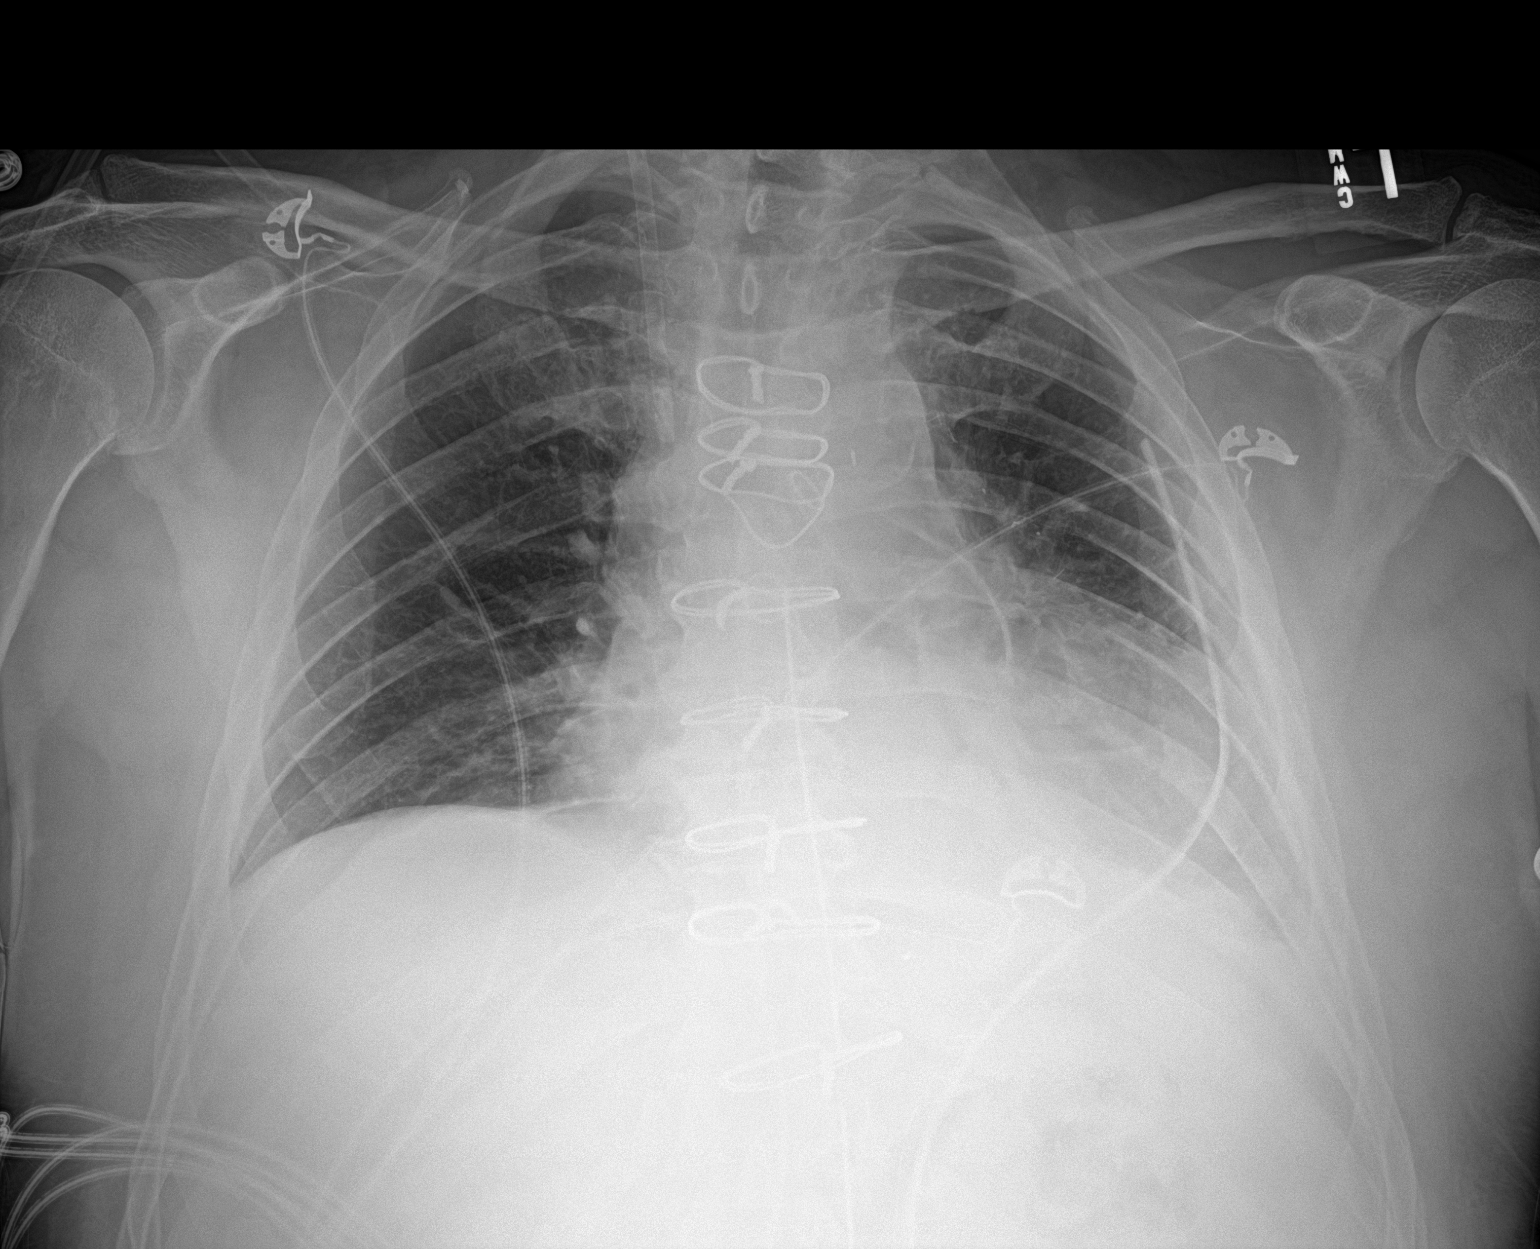

[1 of 1 positions shown; findings below may reference images not displayed]

FINDINGS: The heart is enlarged. Lung volumes remain low. Swan-Ganz catheter
was removed. Left-sided chest tube and mediastinal drain remain in
place. Aeration is slightly improved. Left pleural effusion and
basilar atelectasis remain. There is no pneumothorax.
IMPRESSION: 1. Interval removal of Swan-Ganz catheter.  Right IJ sheath remains.
2. Slight improved aeration.
3. Stable left-sided chest tube.  No pneumothorax.

## 2019-05-12 IMAGING — DX DG CHEST 1V PORT
1 series · 1 of 1 positions shown · non-contrast
Comparison: 09/08/2017

CLINICAL DATA: Follow-up atelectasis

EXAM:
PORTABLE CHEST 1 VIEW

[chest ap]
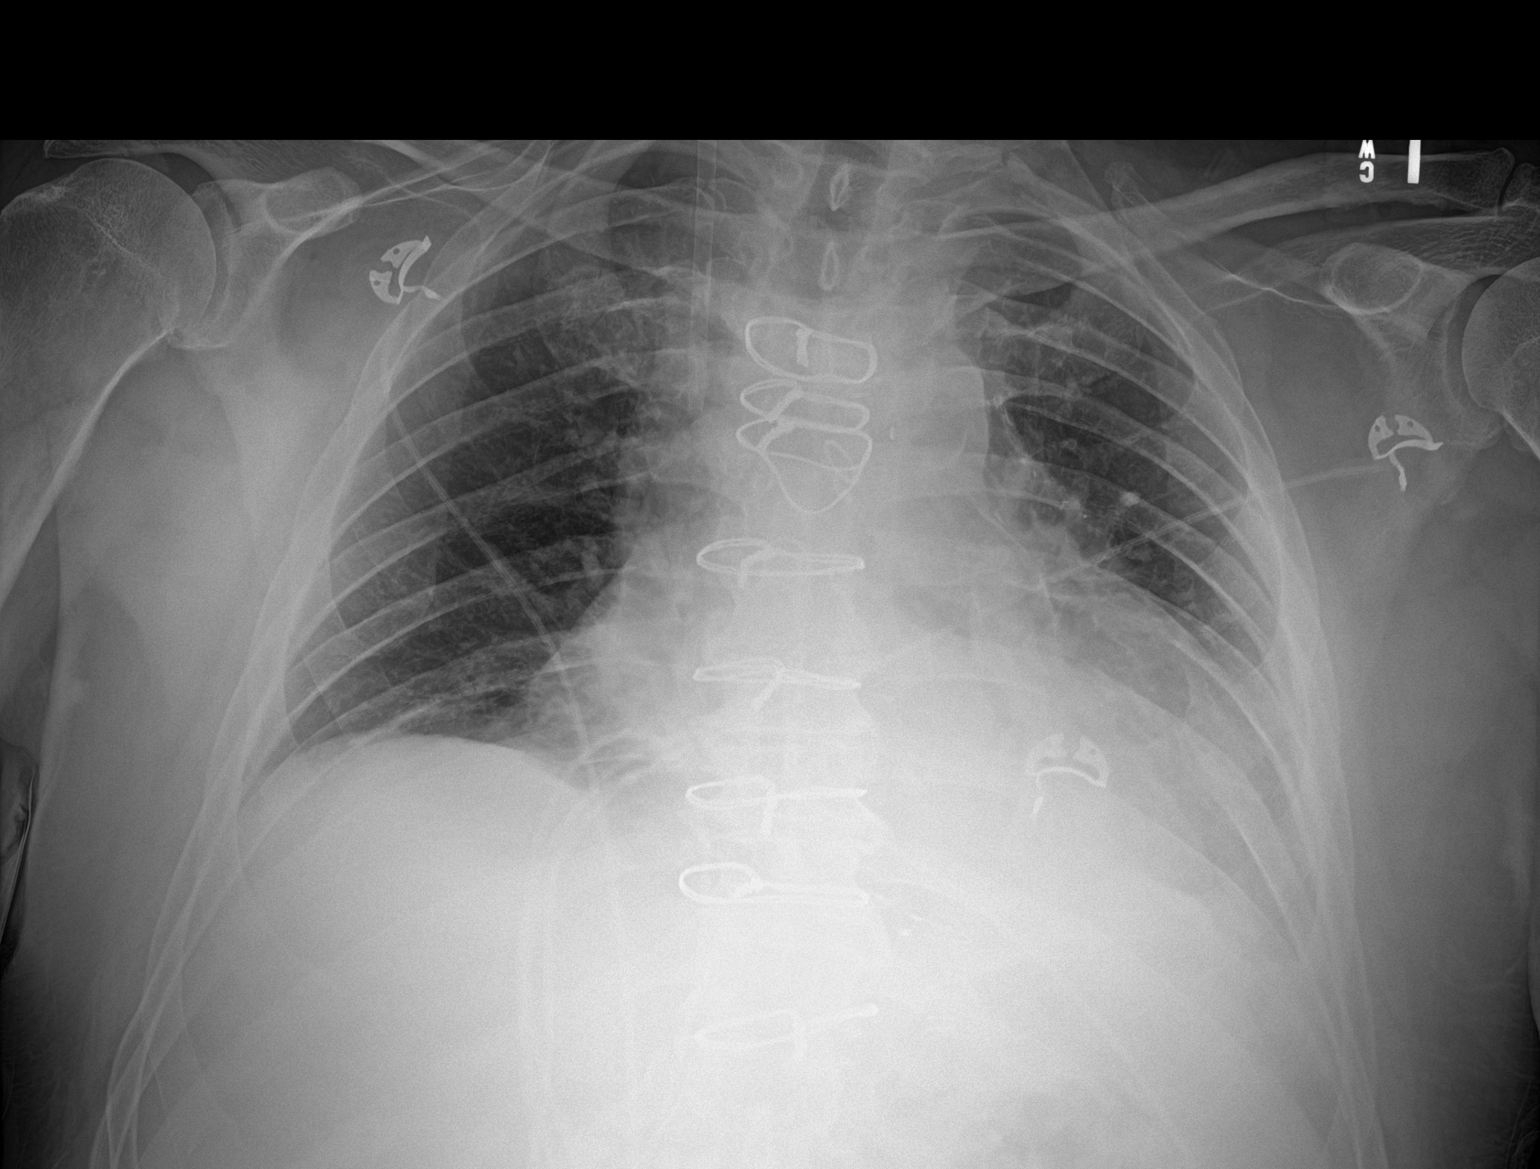

[1 of 1 positions shown; findings below may reference images not displayed]

FINDINGS: Cardiac shadow remains enlarged. Postsurgical changes are again
seen. Right jugular sheath remains in place. Mediastinal drain and
left thoracostomy catheter have been removed in the interval. No
pneumothorax is seen. Minimal blunting of left costophrenic angle
remains with mild atelectatic changes.
IMPRESSION: Stable changes in the left base.

No pneumothorax following chest tube removal.

## 2019-06-10 IMAGING — CR DG CHEST 2V
2 series · 2 of 2 positions shown · non-contrast
Comparison: 09/09/2017

CLINICAL DATA: S/P CABG 09/06/17, CAD, HTN, DM, there are no current
chest complaints (patient going to [REDACTED] now)

EXAM:
CHEST - 2 VIEW

[w chest pa]
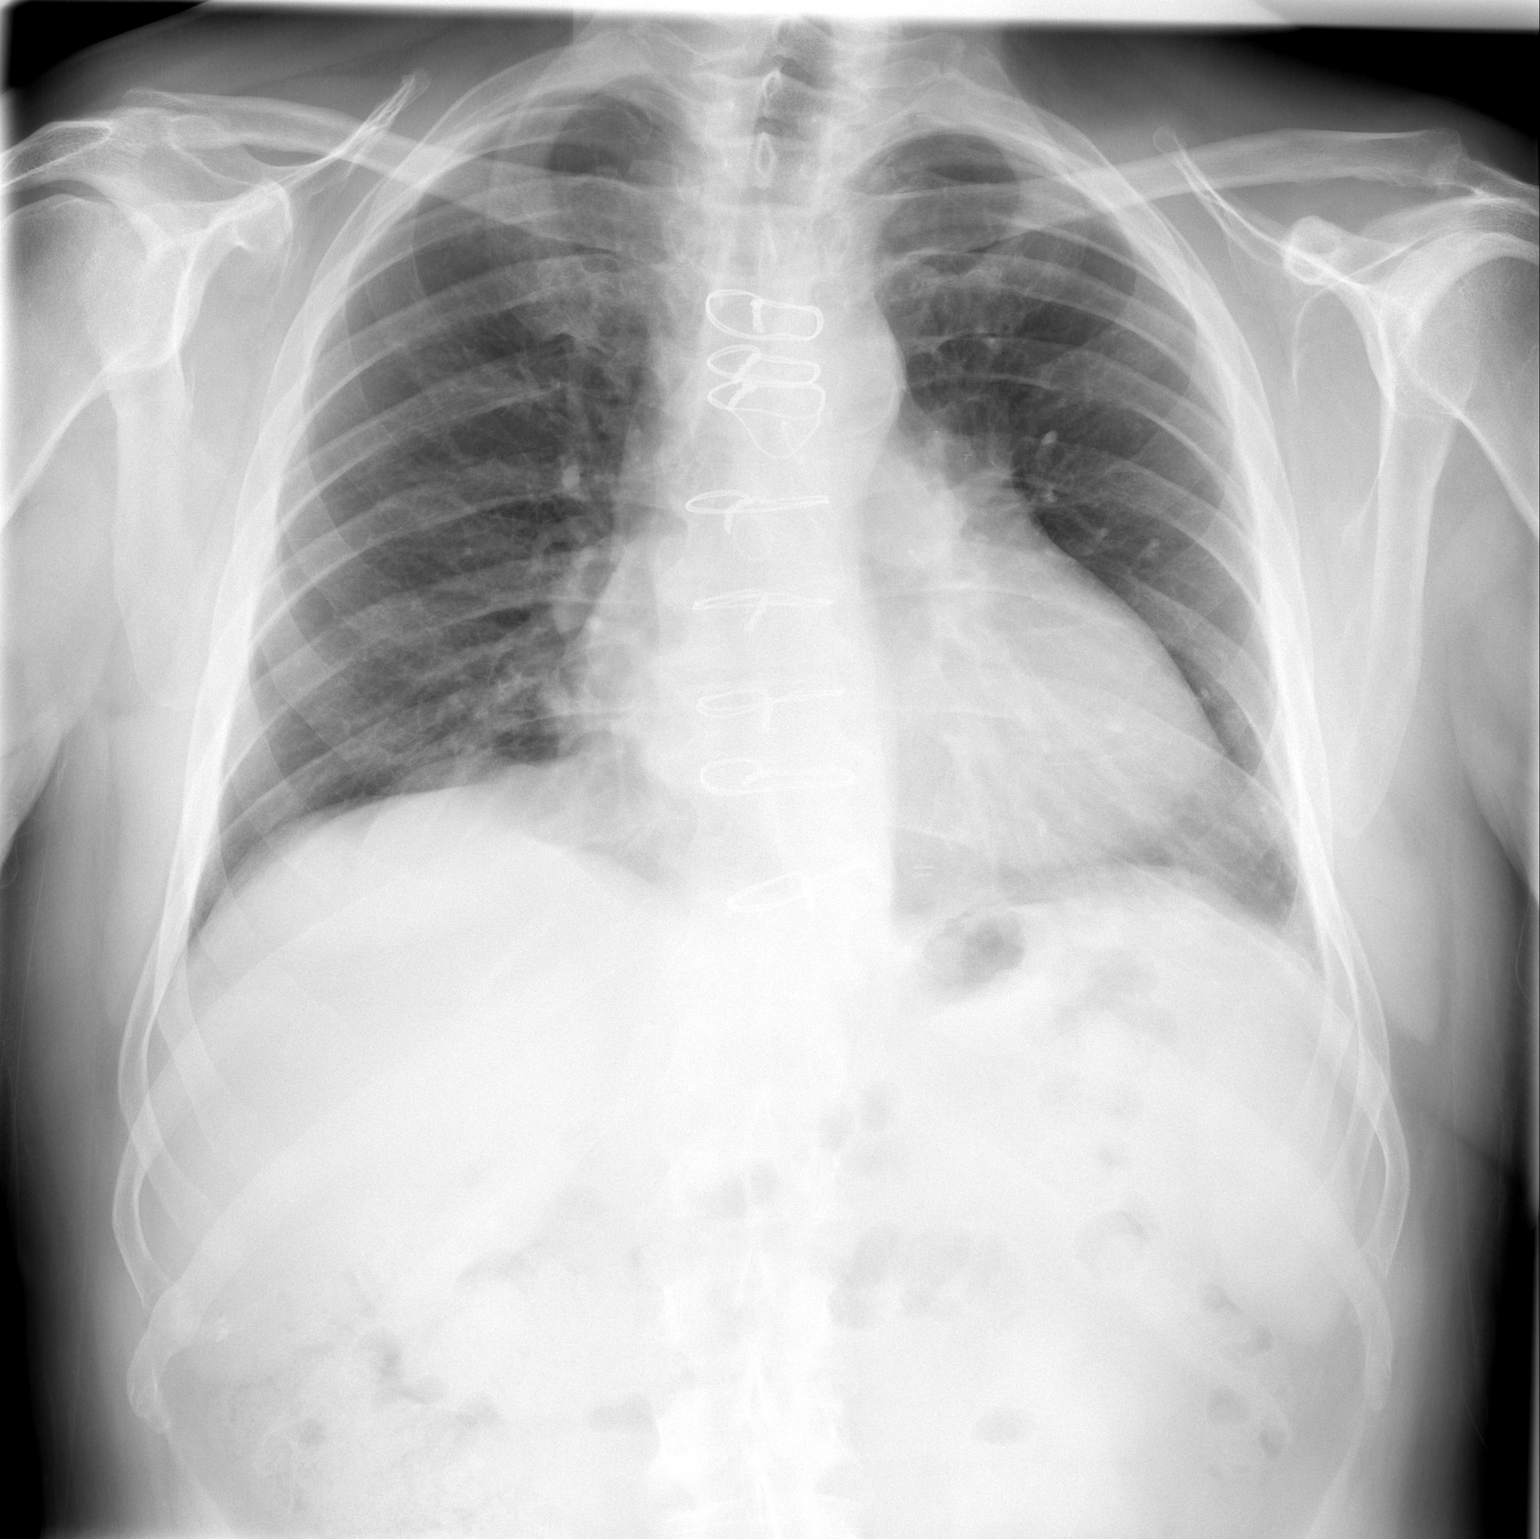

[w chest lat]
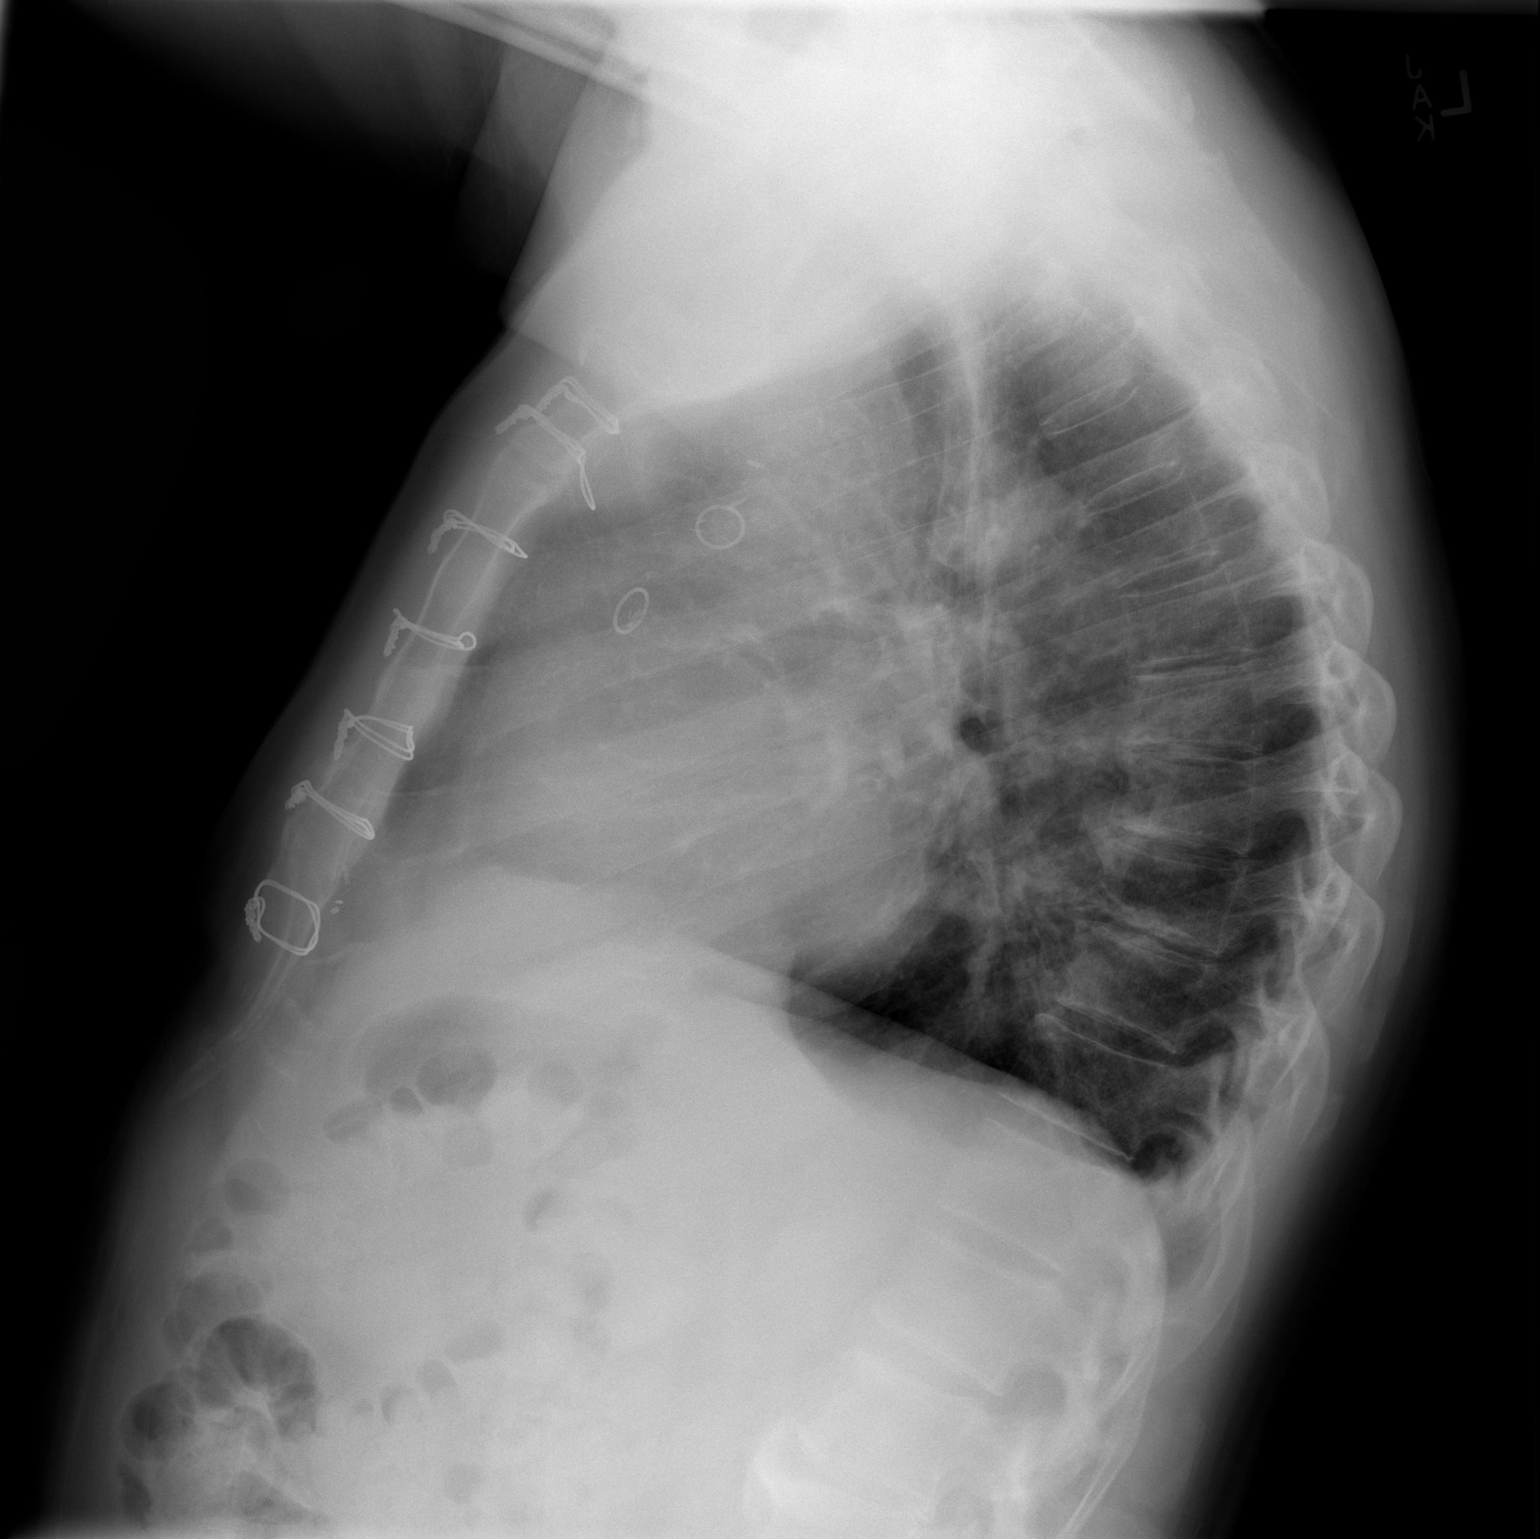

[2 of 2 positions shown; findings below may reference images not displayed]

FINDINGS: Lungs are clear. Improved aeration of the left lung base since prior
study.

Heart size and mediastinal contours are within normal limits.
Previous CABG. Aortic Atherosclerosis ([id number]-170.0).

No effusion.

Previous median sternotomy.  Visualized bones unremarkable.
IMPRESSION: No acute cardiopulmonary disease post   CABG.

## 2019-06-18 ENCOUNTER — Encounter: Payer: Self-pay | Admitting: Cardiology

## 2019-06-18 ENCOUNTER — Telehealth (INDEPENDENT_AMBULATORY_CARE_PROVIDER_SITE_OTHER): Payer: PPO | Admitting: Cardiology

## 2019-06-18 ENCOUNTER — Telehealth: Payer: Self-pay | Admitting: *Deleted

## 2019-06-18 VITALS — BP 146/70 | HR 61 | Temp 96.2°F | Ht 71.0 in | Wt 210.0 lb

## 2019-06-18 DIAGNOSIS — I255 Ischemic cardiomyopathy: Secondary | ICD-10-CM

## 2019-06-18 DIAGNOSIS — I493 Ventricular premature depolarization: Secondary | ICD-10-CM | POA: Diagnosis not present

## 2019-06-18 DIAGNOSIS — I2510 Atherosclerotic heart disease of native coronary artery without angina pectoris: Secondary | ICD-10-CM | POA: Diagnosis not present

## 2019-06-18 DIAGNOSIS — I471 Supraventricular tachycardia: Secondary | ICD-10-CM | POA: Diagnosis not present

## 2019-06-18 DIAGNOSIS — E1169 Type 2 diabetes mellitus with other specified complication: Secondary | ICD-10-CM | POA: Diagnosis not present

## 2019-06-18 DIAGNOSIS — I10 Essential (primary) hypertension: Secondary | ICD-10-CM | POA: Diagnosis not present

## 2019-06-18 DIAGNOSIS — E785 Hyperlipidemia, unspecified: Secondary | ICD-10-CM

## 2019-06-18 MED ORDER — ATORVASTATIN CALCIUM 80 MG PO TABS: 80.0000 mg | ORAL_TABLET | Freq: Every day | ORAL | 3 refills | Status: DC

## 2019-06-18 NOTE — Progress Notes (Signed)
Virtual Visit via Telephone Note   This visit type was conducted due to national recommendations for restrictions regarding the COVID-19 Pandemic (e.g. social distancing) in an effort to limit this patient's exposure and mitigate transmission in our community.  Due to his co-morbid illnesses, this patient is at least at moderate risk for complications without adequate follow up.  This format is felt to be most appropriate for this patient at this time.  The patient did not have access to video technology/had technical difficulties with video requiring transitioning to audio format only (telephone).  All issues noted in this document were discussed and addressed.  No physical exam could be performed with this format.  Please refer to the patient's chart for his  consent to telehealth for [facility].   Patient has given verbal permission to conduct this visit via virtual appointment and to bill insurance 06/18/2019 1:33 PM     Evaluation Performed:  Follow-up visit  Date:  06/18/2019   ID:  Taylor [name], DOB Sep 04, 1950, MRN [id number]  Patient Location: Home Provider Location: Home  PCP:  Tisovec, Fransico Him, MD  Cardiologist:  [name], MD  Electrophysiologist:  Will Meredith Leeds, MD   Chief Complaint:   Chief Complaint  Patient presents with  . Follow-up    Doing well.  No complaints  . Coronary Artery Disease    No angina    History of Present Illness:    Taylor [name] is a 68 y.o. male with PMH notable for hypertension, hyperlipidemia and multivessel CAD-CABG (diagnosed because of frequent PVCs and progressive angina) who presents via audio/video conferencing for a telehealth visit today.  [name] was initially seen for ventricular bigeminy/bradycardia and a strong family history of CAD.   --> He noted some significant exertional dyspnea and chest tightness. ->  High Risk Myoview with anterior ischemia--Cath w/ MV CAD--> CABG.  -> Postop monitor showed PSVT  (initially considered to be A. 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  He had noticed may be 1 or 2 episodes of fast heart rates over the past year.  No requirement for additional beta-blocker.  He notes that his blood pressures are usually much better at home than they are when he comes to the doctor's offices.  No chest pain pressure or dyspnea.  Plan was to monitor his blood pressure over the course of about a year to see if we need to increase his ACE inhibitor up to 20 mg or potentially add HCTZ.  Hospitalizations:  . None   Recent - Interim CV studies:   The following studies were reviewed today: . No new studies:  Inerval History   Taylor [name] is being evaluated today via telemedicine conferencing for 5-1-month follow-up for his CAD doing very well.  He is no active symptoms.  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  He monitors how much she walks and is getting at least 14,000 steps a day at a minimum.  He denies any cardiac symptoms of chest tightness pressure or dyspnea with rest or exertion.  No heart failure symptoms.  He has really not had any significant significant heart rate spells.  He says 3 times in  the last 5 months he has had episodes where his heart rate would spontaneously go up into the 120 bpm range and that is usually self-limited where he just sits down take some deep breaths and rest and then go away after a few minutes.  He denies any dizziness or syncope/near syncope associated with them.  Cardiovascular ROS: no chest pain or dyspnea on exertion positive for - Rare rapid heart rate spells-self-limited negative for - edema, irregular heartbeat, orthopnea, paroxysmal nocturnal dyspnea,  shortness of breath or Syncope/near syncope, TIA/arthrosis fugax, claudication   ROS:  Please see the history of present illness.    The patient does not have symptoms concerning for COVID-19 infection (fever, chills, cough, or new shortness of breath).  Review of Systems  Constitutional: Negative for malaise/fatigue and weight loss.  HENT: Negative for congestion and nosebleeds.   Respiratory: Negative for shortness of breath.   Cardiovascular: Negative for leg swelling.  Gastrointestinal: Positive for diarrhea (Last week he had about 4 days of loose stools with GI upset--there was a GI bug going around at work; now back eating regular foods with normal stools.). Negative for blood in stool and melena.  Genitourinary: Negative for hematuria.  Musculoskeletal: Negative for falls and joint pain.  Neurological: Negative for dizziness and focal weakness.  Psychiatric/Behavioral: Negative.     The patient is practicing social distancing.  He has delayed his COVID-19 vaccination--he states that about the time that they were thinking about going to get the vaccine ([employer]) their son and his entire family got sick so they had to do the quarantine.  They wanted to wait and see how they did after that.  They tested negative and are now looking to find out where they can get their vaccine done.  Past Medical History:  Diagnosis Date  . Bigeminal rhythm 08/01/2017   per EKG 07-24-2017 in epic (per confirmation new since last ecg in 2007)  in care everywhere in epic ecg dated 2012 shows sinus brady rate 53 no pvc's --per pt asymptomatic and   . Bradycardia   . Coronary artery disease involving native coronary artery 08/2017   Abnormal Myoview 08/2017 (Abnormal EKG - Severe ST depressions, PVCs & nsVT). TID noted.  Moderate LAD territory Ischemic Perfusion defect --> Cath with extensive RCA (RPDA & RPAV), Cx & LAD disease --> CABG x 4  . Family history of premature coronary artery disease      Father - MI 49. Paternal uncle x 2 -1 with MI & other with CABG in 48s  . History of contusion    ED visit 08-04-2013 , 9 days past injury ,  dx concussion w/ LOC, per CT inferior frontal lobes contusion hemarrhage-- residual headache (08-01-2017  per pt residual resolved)  . History of kidney stones   . Hypercholesterolemia   . Hypertension   . Nephrolithiasis    bilateral nonobstructive per CT 07-24-2017  . S/P CABG x 4 09/06/2017   LIMA to LAD, SVG to OM2, Sequential SVG to PDA and RPL2, EVH via right thigh and leg  . Type 2 diabetes mellitus ([place])    followed by pcp  . Ureteral calculi    left  . Wears glasses   . Wears hearing aid in both ears    Past Surgical History:  Procedure Laterality Date  . CORONARY ARTERY BYPASS GRAFT N/A 09/06/2017   Procedure: CORONARY ARTERY BYPASS GRAFTING (CABG) x 4, using left internal mammary artery and right leg greater saphenous vein  harvested endoscopically;  Surgeon: Rexene Alberts, MD;  Location: [facility];  Service: Open Heart Surgery;; LIMA-LAD, SVG-OM2, SeqSVG-RPDA-RPL2  . CYSTOSCOPY WITH RETROGRADE PYELOGRAM, URETEROSCOPY AND STENT PLACEMENT Left 08/02/2018   Procedure: CYSTOSCOPY WITH RETROGRADE PYELOGRAM, URETEROSCOPY AND STENT PLACEMENT;  Surgeon: Cleon Gustin, MD;  Location: [facility];  Service: Urology;  Laterality: Left;  . CYSTOSCOPY/RETROGRADE/URETEROSCOPY Left 08/06/2017   Procedure: CYSTOSCOPY/RETROGRADE/URETEROSCOPY laser and stone basket extrection;  Surgeon: Cleon Gustin, MD;  Location: [facility];  Service: Urology;  Laterality: Left;  . HOLMIUM LASER APPLICATION Left [id number]   Procedure: HOLMIUM LASER APPLICATION;  Surgeon: Cleon Gustin, MD;  Location: [facility];  Service: Urology;  Laterality: Left;  . LAPAROSCOPIC APPENDECTOMY  11-18-2005    dr wyatt  [facility]  . LEFT HEART CATH AND CORONARY ANGIOGRAPHY N/A 09/03/2017   Procedure: LEFT HEART CATH AND CORONARY ANGIOGRAPHY;  Surgeon: Leonie Man,  MD;  Location: [facility];  Service: Cardiovascular;; mRCA 70%, dRCA 99%-95% into RPAV & 90% ostRPDA 95%. RPL1 70%, distal RVAM (tandem PDA) - 85%. Ost-prox LAD 40% -pLAD 60&90% @ D1 ->p-mLAD 95% @ D2 with 70% D2. pCx 80%, p-mCx 90%-> mCx 85%.  EF 45-50%, global HK. -->> referred for CABG  . NASAL ENDOSCOPY WITH EPISTAXIS CONTROL N/A 11/29/2017   Procedure: NASAL ENDOSCOPY WITH EPISTAXIS CONTROL;  Surgeon: Leta Baptist, MD;  Location: [facility];  Service: ENT;  Laterality: N/A;  . NM MYOVIEW LTD  08/31/2017   HIGH RISK: Combination Imaging & EKG:  with  in tmedium sized, moderate severity Reversible perfusion e apdefect in  the mid and apical anterior, apical lateral walls - FINDINGS ARE C/W ISCHEMIA in the LAD territory,  however given significant arrhythmias, severe ST depressions and TID =  MV CAD is highly likely (SDS =7).   . ORCHIECTOMY  1965   unilateral -- per pt ruptured  . TEE WITHOUT CARDIOVERSION N/A 09/06/2017   Procedure: TRANSESOPHAGEAL ECHOCARDIOGRAM (TEE);  INTRA-OP.  Surgeon: Rexene Alberts, MD;  Location: [facility] OR;  Service: Open Heart Surgery:;; Normal AoV, MV & TV.  Normal RV.  Marland Kitchen TRANSTHORACIC ECHOCARDIOGRAM  09/04/2017   (pre-CABG): EF 40-45%, diffuse HK.  Difficult to assess due to frequent PVCs. Severe LA dilation. Mildly increased PAP.  Marland Kitchen TRANSTHORACIC ECHOCARDIOGRAM  01/01/2018   Post CABG: EF moderately reduced 40 and 45%.  Diffuse HK.  No identifiable R WMA.  GRII DD with severe LA dilation.  (High filling pressures).  Moderate aortic calcification-aortic sclerosis with no stenosis.  Mildly elevated PA pressures.  Marland Kitchen UMBILICAL HERNIA REPAIR  2006  . VARICOSE VEIN SURGERY  2007   left endovascular laser ablation     Current Meds  Medication Sig  . aspirin EC 81 MG tablet Take 1 tablet (81 mg total) by mouth daily.  Marland Kitchen atorvastatin (LIPITOR) 80 MG tablet Take 1 tablet (80 mg total) by mouth daily at 6 PM.  . lisinopril (ZESTRIL) 10 MG tablet Take 1  tablet (10 mg total) by mouth daily.  . metFORMIN (GLUCOPHAGE) 500 MG tablet Take 500 mg by mouth 2 (two) times daily.  . metoprolol succinate (TOPROL XL) 25 MG 24 hr tablet Take 1 tablet (25 mg total) by mouth 2 (two) times daily. NEED FOLLOW UP FOR FUTURE REFILL..  . metoprolol tartrate (LOPRESSOR) 25 MG tablet Take 1 tablet (25 mg total) by mouth as needed. TAKE FOR PROLONG INCREASE HEART RATE OR PALPATIONS  . Multiple Vitamin (MULTIVITAMIN) tablet Take 1  tablet by mouth daily.  . TRULICITY 1.5 0000000 SOPN Take every Sunday  . [DISCONTINUED] atorvastatin (LIPITOR) 80 MG tablet TAKE 1 TABLET (80 MG TOTAL) BY MOUTH DAILY AT 6 PM.     Allergies:   Patient has no known allergies.   Social History   Tobacco Use  . Smoking status: Never Smoker  . Smokeless tobacco: Never Used  Substance Use Topics  . Alcohol use: No  . Drug use: No     Family Hx: The patient's family history includes Atrial fibrillation in his mother; CAD in his paternal uncle; Clotting disorder in his father; Diabetes in his daughter; Heart attack in his paternal uncle; Heart attack (age of onset: 59) in his father; Heart disease in an other family member; Stroke in his mother and another family member.   Labs/Other Tests and Data Reviewed:    EKG:  No ECG reviewed.  Recent Labs: 07/31/2018: Hemoglobin 13.7; Platelets 244 01/14/2019: ALT 24; BUN 19; Creatinine, Ser 1.26; Potassium 5.4; Sodium 140   Recent Lipid Panel Lab Results  Component Value Date/Time   CHOL 109 01/14/2019 09:17 AM   TRIG 79 01/14/2019 09:17 AM   HDL 36 (L) 01/14/2019 09:17 AM   CHOLHDL 3.0 01/14/2019 09:17 AM   LDLCALC 57 01/14/2019 09:17 AM    Wt Readings from Last 3 Encounters:  06/18/19 210 lb (95.3 kg)  01/14/19 219 lb (99.3 kg)  12/17/18 219 lb 3.2 oz (99.4 kg)     Objective:    Vital Signs:  BP (!) 146/70   Pulse 61   Temp (!) 96.2 F (35.7 C)   Ht 5\' 11"  (1.803 m)   Wt 210 lb (95.3 kg)   BMI 29.29 kg/m   - BP  usually 120-130/70 mmHg VITAL SIGNS:  reviewed Very pleasant gentleman, in no acute distress. A&O x 3.  Normal Mood & Affect Non-labored respirations   ASSESSMENT & PLAN:    Problem List Items Addressed This Visit    Frequent unifocal PVCs (Chronic)    Pretty stable on high-dose Toprol.  Not symptomatic.      Relevant Medications   atorvastatin (LIPITOR) 80 MG tablet   Coronary artery disease involving native coronary artery of native heart without angina pectoris - Primary (Chronic)    It was amazing how well he coped with having relatively significant CAD.  It was the PVCs that kind of diagnosed and then he did acknowledge to having some exertional dyspnea and chest discomfort.  Found to have multivessel disease on cath and referred for CABG. He is now back to his full level of activity with no complaints.  He has no palpitations or anginal symptoms.  No heart failure symptoms despite having reduced EF.  Plan:   Continue aspirin and statin at current dose.  Continue current dose of beta-blocker and ACE inhibitor -> anticipate that we may need to titrate up ACE inhibitor versus add HCTZ  He is also on GLP-1 agonist along with Metformin.       Relevant Medications   atorvastatin (LIPITOR) 80 MG tablet   Ischemic cardiomyopathy - pre-CABG ECHO: 40-45% (Chronic)    He seems to be compensating relatively well for reduced EF.  No active heart failure symptoms.  No exertional dyspnea, PND orthopnea.  Euvolemic per his description with no edema.  He is on Toprol and lisinopril stable doses.  No indication for loop diuretic, however he may benefit from either HCTZ or chlorthalidone for additional blood pressure control (especially in light  of elevated potassium level)      Relevant Medications   atorvastatin (LIPITOR) 80 MG tablet   Hyperlipidemia associated with type 2 diabetes mellitus ([place]) (Chronic)    He is currently on 80 mg atorvastatin LDL is 57.  No myalgias or other  symptoms, however I am a little bit leery of being on high-dose atorvastatin.  Low threshold to consider switching to Crestor 40 mg.  On combination of Metformin/sitagliptin and Trulicity which is a GLP-1 agonist for diabetes.  A1c was 7.7 back in November.  Not on SGLT2 inhibitor, would consider if indicated (may be cost prohibitive).      Relevant Medications   atorvastatin (LIPITOR) 80 MG tablet   PSVT (paroxysmal supraventricular tachycardia) (HCC) (Chronic)    He had documented short bursts of AVNRT postoperatively.  He is now on twice daily Toprol and it seems to keep these episodes mostly at [place].  The episodes that he is having are short-lived and not requiring additional beta-blocker. We did discuss vagal maneuvers.  He is followed up with EP and the recommendation for now is is expectant management and consider ablation if symptoms worsen.      Relevant Medications   atorvastatin (LIPITOR) 80 MG tablet   Essential hypertension (Chronic)    Is recorded blood pressure is high today.  But he tells me usually is in the 120-130/70 mmHg range.  It is hard to know what his blood pressure truly is.  Have asked that he continue to maintain a blood pressure log. Since his labs have shown somewhat elevated potassium levels in the past, I would consider actually adding low-dose HCTZ to the lisinopril that he is already on if his blood pressures continue to be elevated based on his blood pressure readings.    He is due to see his PCP soon after getting blood work done.  He should bring the blood pressure recordings to his PCPs office as well -> medication can be titrated or added..      Relevant Medications   atorvastatin (LIPITOR) 80 MG tablet      COVID-19 Education: The signs and symptoms of COVID-19 were discussed with the patient and how to seek care for testing (follow up with PCP or arrange E-visit).   The importance of social distancing was discussed today.  We also discussed the  importance of obtaining the COVID-19 vaccine.  Time:   Today, I have spent 22 minutes with the patient with telehealth technology discussing the above problems.     Medication Adjustments/Labs and Tests Ordered: Current medicines are reviewed at length with the patient today.  Concerns regarding medicines are outlined above.   Patient Instructions  Medication Instructions:   No change  *If you need a refill on your cardiac medications before your next appointment, please call your pharmacy*   Lab Work:  Keep up with Dr. Loren Racer routine lab     Testing/Procedures:  N/a   Follow-Up: At [facility], you and your health needs are our priority.  As part of our continuing mission to provide you with exceptional heart care, we have created designated Provider Care Teams.  These Care Teams include your primary Cardiologist (physician) and Advanced Practice Providers (APPs -  Physician Assistants and Nurse Practitioners) who all work together to provide you with the care you need, when you need it.     Your next appointment:   8 month(s)  Jan 2022 The format for your next appointment:   In Person  Provider:  [name], MD   Other Instructions  Keep staying active -- you are doing great     Signed, [name], MD  06/18/2019 1:33 PM    [place]

## 2019-06-18 NOTE — Assessment & Plan Note (Signed)
He had documented short bursts of AVNRT postoperatively.  He is now on twice daily Toprol and it seems to keep these episodes mostly at [place].  The episodes that he is having are short-lived and not requiring additional beta-blocker. We did discuss vagal maneuvers.  He is followed up with EP and the recommendation for now is is expectant management and consider ablation if symptoms worsen.

## 2019-06-18 NOTE — Assessment & Plan Note (Signed)
Is recorded blood pressure is high today.  But he tells me usually is in the 120-130/70 mmHg range.  It is hard to know what his blood pressure truly is.  Have asked that he continue to maintain a blood pressure log. Since his labs have shown somewhat elevated potassium levels in the past, I would consider actually adding low-dose HCTZ to the lisinopril that he is already on if his blood pressures continue to be elevated based on his blood pressure readings.    He is due to see his PCP soon after getting blood work done.  He should bring the blood pressure recordings to his PCPs office as well -> medication can be titrated or added.Taylor [name]

## 2019-06-18 NOTE — Patient Instructions (Addendum)
Medication Instructions:   No change  *If you need a refill on your cardiac medications before your next appointment, please call your pharmacy*   Lab Work:  Keep up with Dr. Loren Racer routine lab     Testing/Procedures:  N/a   Follow-Up: At [facility], you and your health needs are our priority.  As part of our continuing mission to provide you with exceptional heart care, we have created designated Provider Care Teams.  These Care Teams include your primary Cardiologist (physician) and Advanced Practice Providers (APPs -  Physician Assistants and Nurse Practitioners) who all work together to provide you with the care you need, when you need it.     Your next appointment:   8 month(s)  Jan 2022 The format for your next appointment:   In Person  Provider:   [name], MD   Other Instructions  Keep staying active -- you are doing great

## 2019-06-18 NOTE — Telephone Encounter (Signed)
RN spoke to patient. Instruction were given  from today's virtual visit  06/18/19 .  AVS SUMMARY has been sent by mychart .   Patient verbalized understanding

## 2019-06-18 NOTE — Assessment & Plan Note (Signed)
He is currently on 80 mg atorvastatin LDL is 57.  No myalgias or other symptoms, however I am a little bit leery of being on high-dose atorvastatin.  Low threshold to consider switching to Crestor 40 mg.  On combination of Metformin/sitagliptin and Trulicity which is a GLP-1 agonist for diabetes.  A1c was 7.7 back in November.  Not on SGLT2 inhibitor, would consider if indicated (may be cost prohibitive).

## 2019-06-18 NOTE — Assessment & Plan Note (Signed)
Pretty stable on high-dose Toprol.  Not symptomatic.

## 2019-06-18 NOTE — Telephone Encounter (Signed)
  Patient Consent for Virtual Visit         [name] has provided verbal consent on 06/18/2019 for a virtual visit (video or telephone).   CONSENT FOR VIRTUAL VISIT FOR:  Taylor [name]  By participating in this virtual visit I agree to the following:  I hereby voluntarily request, consent and authorize [place] and its employed or contracted physicians, physician assistants, nurse practitioners or other licensed health care professionals (the Practitioner), to provide me with telemedicine health care services (the "Services") as deemed necessary by the treating Practitioner. I acknowledge and consent to receive the Services by the Practitioner via telemedicine. I understand that the telemedicine visit will involve communicating with the Practitioner through live audiovisual communication technology and the disclosure of certain medical information by electronic transmission. I acknowledge that I have been given the opportunity to request an in-person assessment or other available alternative prior to the telemedicine visit and am voluntarily participating in the telemedicine visit.  I understand that I have the right to withhold or withdraw my consent to the use of telemedicine in the course of my care at any time, without affecting my right to future care or treatment, and that the Practitioner or I may terminate the telemedicine visit at any time. I understand that I have the right to inspect all information obtained and/or recorded in the course of the telemedicine visit and may receive copies of available information for a reasonable fee.  I understand that some of the potential risks of receiving the Services via telemedicine include:  Marland Kitchen Delay or interruption in medical evaluation due to technological equipment failure or disruption; . Information transmitted may not be sufficient (e.g. poor resolution of images) to allow for appropriate medical decision making by the  Practitioner; and/or  . In rare instances, security protocols could fail, causing a breach of personal health information.  Furthermore, I acknowledge that it is my responsibility to provide information about my medical history, conditions and care that is complete and accurate to the best of my ability. I acknowledge that Practitioner's advice, recommendations, and/or decision may be based on factors not within their control, such as incomplete or inaccurate data provided by me or distortions of diagnostic images or specimens that may result from electronic transmissions. I understand that the practice of medicine is not an exact science and that Practitioner makes no warranties or guarantees regarding treatment outcomes. I acknowledge that a copy of this consent can be made available to me via my patient portal ([place]), or I can request a printed copy by calling the office of [facility].    I understand that my insurance will be billed for this visit.   I have read or had this consent read to me. . I understand the contents of this consent, which adequately explains the benefits and risks of the Services being provided via telemedicine.  . I have been provided ample opportunity to ask questions regarding this consent and the Services and have had my questions answered to my satisfaction. . I give my informed consent for the services to be provided through the use of telemedicine in my medical care

## 2019-06-18 NOTE — Assessment & Plan Note (Signed)
He seems to be compensating relatively well for reduced EF.  No active heart failure symptoms.  No exertional dyspnea, PND orthopnea.  Euvolemic per his description with no edema.  He is on Toprol and lisinopril stable doses.  No indication for loop diuretic, however he may benefit from either HCTZ or chlorthalidone for additional blood pressure control (especially in light of elevated potassium level)

## 2019-06-18 NOTE — Assessment & Plan Note (Signed)
It was amazing how well he coped with having relatively significant CAD.  It was the PVCs that kind of diagnosed and then he did acknowledge to having some exertional dyspnea and chest discomfort.  Found to have multivessel disease on cath and referred for CABG. He is now back to his full level of activity with no complaints.  He has no palpitations or anginal symptoms.  No heart failure symptoms despite having reduced EF.  Plan:   Continue aspirin and statin at current dose.  Continue current dose of beta-blocker and ACE inhibitor -> anticipate that we may need to titrate up ACE inhibitor versus add HCTZ  He is also on GLP-1 agonist along with Metformin.

## 2019-06-19 DIAGNOSIS — E7800 Pure hypercholesterolemia, unspecified: Secondary | ICD-10-CM | POA: Diagnosis not present

## 2019-06-19 DIAGNOSIS — E1129 Type 2 diabetes mellitus with other diabetic kidney complication: Secondary | ICD-10-CM | POA: Diagnosis not present

## 2019-06-19 DIAGNOSIS — Z125 Encounter for screening for malignant neoplasm of prostate: Secondary | ICD-10-CM | POA: Diagnosis not present

## 2019-06-26 DIAGNOSIS — E7800 Pure hypercholesterolemia, unspecified: Secondary | ICD-10-CM | POA: Diagnosis not present

## 2019-06-26 DIAGNOSIS — R82998 Other abnormal findings in urine: Secondary | ICD-10-CM | POA: Diagnosis not present

## 2019-06-26 DIAGNOSIS — I130 Hypertensive heart and chronic kidney disease with heart failure and stage 1 through stage 4 chronic kidney disease, or unspecified chronic kidney disease: Secondary | ICD-10-CM | POA: Diagnosis not present

## 2019-06-26 DIAGNOSIS — Z0000 Encounter for general adult medical examination without abnormal findings: Secondary | ICD-10-CM | POA: Diagnosis not present

## 2019-06-26 DIAGNOSIS — Z1331 Encounter for screening for depression: Secondary | ICD-10-CM | POA: Diagnosis not present

## 2019-06-26 DIAGNOSIS — I25810 Atherosclerosis of coronary artery bypass graft(s) without angina pectoris: Secondary | ICD-10-CM | POA: Diagnosis not present

## 2019-06-26 DIAGNOSIS — I255 Ischemic cardiomyopathy: Secondary | ICD-10-CM | POA: Diagnosis not present

## 2019-06-26 DIAGNOSIS — E669 Obesity, unspecified: Secondary | ICD-10-CM | POA: Diagnosis not present

## 2019-06-26 DIAGNOSIS — R809 Proteinuria, unspecified: Secondary | ICD-10-CM | POA: Diagnosis not present

## 2019-06-26 DIAGNOSIS — N1832 Chronic kidney disease, stage 3b: Secondary | ICD-10-CM | POA: Diagnosis not present

## 2019-06-26 DIAGNOSIS — Z1212 Encounter for screening for malignant neoplasm of rectum: Secondary | ICD-10-CM | POA: Diagnosis not present

## 2019-06-26 DIAGNOSIS — D126 Benign neoplasm of colon, unspecified: Secondary | ICD-10-CM | POA: Diagnosis not present

## 2019-06-26 DIAGNOSIS — E1129 Type 2 diabetes mellitus with other diabetic kidney complication: Secondary | ICD-10-CM | POA: Diagnosis not present

## 2019-06-26 DIAGNOSIS — I471 Supraventricular tachycardia: Secondary | ICD-10-CM | POA: Diagnosis not present

## 2019-06-26 DIAGNOSIS — E1165 Type 2 diabetes mellitus with hyperglycemia: Secondary | ICD-10-CM | POA: Diagnosis not present

## 2019-06-26 DIAGNOSIS — Z1339 Encounter for screening examination for other mental health and behavioral disorders: Secondary | ICD-10-CM | POA: Diagnosis not present

## 2019-08-28 DIAGNOSIS — N1832 Chronic kidney disease, stage 3b: Secondary | ICD-10-CM | POA: Diagnosis not present

## 2019-08-28 DIAGNOSIS — I25810 Atherosclerosis of coronary artery bypass graft(s) without angina pectoris: Secondary | ICD-10-CM | POA: Diagnosis not present

## 2019-08-28 DIAGNOSIS — E1165 Type 2 diabetes mellitus with hyperglycemia: Secondary | ICD-10-CM | POA: Diagnosis not present

## 2019-08-28 DIAGNOSIS — I130 Hypertensive heart and chronic kidney disease with heart failure and stage 1 through stage 4 chronic kidney disease, or unspecified chronic kidney disease: Secondary | ICD-10-CM | POA: Diagnosis not present

## 2019-09-10 DIAGNOSIS — L570 Actinic keratosis: Secondary | ICD-10-CM | POA: Diagnosis not present

## 2019-09-10 DIAGNOSIS — L738 Other specified follicular disorders: Secondary | ICD-10-CM | POA: Diagnosis not present

## 2019-09-10 DIAGNOSIS — L819 Disorder of pigmentation, unspecified: Secondary | ICD-10-CM | POA: Diagnosis not present

## 2019-11-06 DIAGNOSIS — Z20822 Contact with and (suspected) exposure to covid-19: Secondary | ICD-10-CM | POA: Diagnosis not present

## 2019-11-10 ENCOUNTER — Other Ambulatory Visit (HOSPITAL_COMMUNITY): Payer: Self-pay

## 2019-11-11 ENCOUNTER — Other Ambulatory Visit: Payer: Self-pay | Admitting: Nurse Practitioner

## 2019-11-11 DIAGNOSIS — U071 COVID-19: Secondary | ICD-10-CM

## 2019-11-11 DIAGNOSIS — Z951 Presence of aortocoronary bypass graft: Secondary | ICD-10-CM

## 2019-11-11 DIAGNOSIS — I10 Essential (primary) hypertension: Secondary | ICD-10-CM

## 2019-11-11 NOTE — Progress Notes (Signed)
I connected by phone with Taylor [name] on 11/11/2019 at 8:38 PM to discuss the potential use of a new treatment for mild to moderate COVID-19 viral infection in non-hospitalized patients.  This patient is a 69 y.o. male that meets the FDA criteria for Emergency Use Authorization of COVID monoclonal antibody casirivimab/imdevimab or bamlanivimab/eteseviamb.  Has a (+) direct SARS-CoV-2 viral test result  Has mild or moderate COVID-19   Is NOT hospitalized due to COVID-19  Is within 10 days of symptom onset  Has at least one of the high risk factor(s) for progression to severe COVID-19 and/or hospitalization as defined in EUA.  Specific high risk criteria : Older age (>/= 69 yo), BMI > 25, Diabetes and Cardiovascular disease or hypertension   I have spoken and communicated the following to the patient or parent/caregiver regarding COVID monoclonal antibody treatment:  1. FDA has authorized the emergency use for the treatment of mild to moderate COVID-19 in adults and pediatric patients with positive results of direct SARS-CoV-2 viral testing who are 69 years of age and older weighing at least 40 kg, and who are at high risk for progressing to severe COVID-19 and/or hospitalization.  2. The significant known and potential risks and benefits of COVID monoclonal antibody, and the extent to which such potential risks and benefits are unknown.  3. Information on available alternative treatments and the risks and benefits of those alternatives, including clinical trials.  4. Patients treated with COVID monoclonal antibody should continue to self-isolate and use infection control measures (e.g., wear mask, isolate, social distance, avoid sharing personal items, clean and disinfect "high touch" surfaces, and frequent handwashing) according to CDC guidelines.   5. The patient or parent/caregiver has the option to accept or refuse COVID monoclonal antibody treatment.  After reviewing this  information with the patient, the patient has agreed to receive one of the available covid 19 monoclonal antibodies and will be provided an appropriate fact sheet prior to infusion. Jobe Gibbon, NP 11/11/2019 8:38 PM

## 2019-11-12 ENCOUNTER — Inpatient Hospital Stay (HOSPITAL_COMMUNITY)
Admission: EM | Admit: 2019-11-12 | Discharge: 2019-11-24 | DRG: 871 | Disposition: A | Discharge status: Home / Self Care | Payer: PPO | Source: Ambulatory Visit | Attending: Internal Medicine | Admitting: Internal Medicine

## 2019-11-12 ENCOUNTER — Other Ambulatory Visit (HOSPITAL_COMMUNITY): Payer: Self-pay

## 2019-11-12 ENCOUNTER — Encounter (HOSPITAL_COMMUNITY): Payer: Self-pay | Admitting: *Deleted

## 2019-11-12 ENCOUNTER — Ambulatory Visit (HOSPITAL_COMMUNITY)
Admission: RE | Admit: 2019-11-12 | Discharge: 2019-11-12 | Disposition: A | Discharge status: Home / Self Care | Payer: Medicare Other | Source: Ambulatory Visit | Attending: Pulmonary Disease | Admitting: Pulmonary Disease

## 2019-11-12 ENCOUNTER — Emergency Department (HOSPITAL_COMMUNITY): Payer: PPO

## 2019-11-12 ENCOUNTER — Other Ambulatory Visit: Payer: Self-pay

## 2019-11-12 DIAGNOSIS — U071 COVID-19: Secondary | ICD-10-CM | POA: Diagnosis not present

## 2019-11-12 DIAGNOSIS — J1289 Other viral pneumonia: Secondary | ICD-10-CM | POA: Diagnosis not present

## 2019-11-12 DIAGNOSIS — I10 Essential (primary) hypertension: Secondary | ICD-10-CM | POA: Diagnosis not present

## 2019-11-12 DIAGNOSIS — Z87442 Personal history of urinary calculi: Secondary | ICD-10-CM | POA: Diagnosis not present

## 2019-11-12 DIAGNOSIS — R6520 Severe sepsis without septic shock: Secondary | ICD-10-CM | POA: Diagnosis present

## 2019-11-12 DIAGNOSIS — Z79899 Other long term (current) drug therapy: Secondary | ICD-10-CM

## 2019-11-12 DIAGNOSIS — Z974 Presence of external hearing-aid: Secondary | ICD-10-CM

## 2019-11-12 DIAGNOSIS — Z7984 Long term (current) use of oral hypoglycemic drugs: Secondary | ICD-10-CM | POA: Diagnosis not present

## 2019-11-12 DIAGNOSIS — I700 Atherosclerosis of aorta: Secondary | ICD-10-CM | POA: Diagnosis not present

## 2019-11-12 DIAGNOSIS — E1169 Type 2 diabetes mellitus with other specified complication: Secondary | ICD-10-CM

## 2019-11-12 DIAGNOSIS — E1165 Type 2 diabetes mellitus with hyperglycemia: Secondary | ICD-10-CM | POA: Diagnosis present

## 2019-11-12 DIAGNOSIS — R680 Hypothermia, not associated with low environmental temperature: Secondary | ICD-10-CM | POA: Diagnosis not present

## 2019-11-12 DIAGNOSIS — I255 Ischemic cardiomyopathy: Secondary | ICD-10-CM | POA: Diagnosis present

## 2019-11-12 DIAGNOSIS — Z8249 Family history of ischemic heart disease and other diseases of the circulatory system: Secondary | ICD-10-CM

## 2019-11-12 DIAGNOSIS — J189 Pneumonia, unspecified organism: Secondary | ICD-10-CM | POA: Diagnosis not present

## 2019-11-12 DIAGNOSIS — I2510 Atherosclerotic heart disease of native coronary artery without angina pectoris: Secondary | ICD-10-CM | POA: Diagnosis not present

## 2019-11-12 DIAGNOSIS — I5032 Chronic diastolic (congestive) heart failure: Secondary | ICD-10-CM | POA: Diagnosis present

## 2019-11-12 DIAGNOSIS — Z7982 Long term (current) use of aspirin: Secondary | ICD-10-CM

## 2019-11-12 DIAGNOSIS — E876 Hypokalemia: Secondary | ICD-10-CM | POA: Diagnosis not present

## 2019-11-12 DIAGNOSIS — Z833 Family history of diabetes mellitus: Secondary | ICD-10-CM

## 2019-11-12 DIAGNOSIS — E785 Hyperlipidemia, unspecified: Secondary | ICD-10-CM | POA: Diagnosis present

## 2019-11-12 DIAGNOSIS — H9193 Unspecified hearing loss, bilateral: Secondary | ICD-10-CM | POA: Diagnosis not present

## 2019-11-12 DIAGNOSIS — I517 Cardiomegaly: Secondary | ICD-10-CM | POA: Diagnosis not present

## 2019-11-12 DIAGNOSIS — R0602 Shortness of breath: Secondary | ICD-10-CM | POA: Diagnosis not present

## 2019-11-12 DIAGNOSIS — R7989 Other specified abnormal findings of blood chemistry: Secondary | ICD-10-CM | POA: Diagnosis not present

## 2019-11-12 DIAGNOSIS — A4189 Other specified sepsis: Principal | ICD-10-CM | POA: Diagnosis present

## 2019-11-12 DIAGNOSIS — I130 Hypertensive heart and chronic kidney disease with heart failure and stage 1 through stage 4 chronic kidney disease, or unspecified chronic kidney disease: Secondary | ICD-10-CM | POA: Diagnosis not present

## 2019-11-12 DIAGNOSIS — N1831 Chronic kidney disease, stage 3a: Secondary | ICD-10-CM | POA: Diagnosis not present

## 2019-11-12 DIAGNOSIS — Z951 Presence of aortocoronary bypass graft: Secondary | ICD-10-CM | POA: Diagnosis present

## 2019-11-12 DIAGNOSIS — J9601 Acute respiratory failure with hypoxia: Secondary | ICD-10-CM | POA: Diagnosis not present

## 2019-11-12 DIAGNOSIS — J1282 Pneumonia due to coronavirus disease 2019: Secondary | ICD-10-CM

## 2019-11-12 DIAGNOSIS — R197 Diarrhea, unspecified: Secondary | ICD-10-CM | POA: Diagnosis not present

## 2019-11-12 DIAGNOSIS — N289 Disorder of kidney and ureter, unspecified: Secondary | ICD-10-CM | POA: Diagnosis not present

## 2019-11-12 DIAGNOSIS — E1122 Type 2 diabetes mellitus with diabetic chronic kidney disease: Secondary | ICD-10-CM | POA: Diagnosis not present

## 2019-11-12 DIAGNOSIS — Z23 Encounter for immunization: Secondary | ICD-10-CM | POA: Insufficient documentation

## 2019-11-12 DIAGNOSIS — R0902 Hypoxemia: Secondary | ICD-10-CM | POA: Diagnosis not present

## 2019-11-12 DIAGNOSIS — N179 Acute kidney failure, unspecified: Secondary | ICD-10-CM | POA: Diagnosis not present

## 2019-11-12 DIAGNOSIS — J181 Lobar pneumonia, unspecified organism: Secondary | ICD-10-CM | POA: Diagnosis not present

## 2019-11-12 DIAGNOSIS — M47814 Spondylosis without myelopathy or radiculopathy, thoracic region: Secondary | ICD-10-CM | POA: Diagnosis not present

## 2019-11-12 DIAGNOSIS — J811 Chronic pulmonary edema: Secondary | ICD-10-CM | POA: Diagnosis not present

## 2019-11-12 DIAGNOSIS — N19 Unspecified kidney failure: Secondary | ICD-10-CM | POA: Diagnosis not present

## 2019-11-12 DIAGNOSIS — E872 Acidosis: Secondary | ICD-10-CM | POA: Diagnosis present

## 2019-11-12 LAB — LACTATE DEHYDROGENASE: LDH: 367 U/L — ABNORMAL HIGH (ref 98–192)

## 2019-11-12 LAB — URINALYSIS, ROUTINE W REFLEX MICROSCOPIC
Bacteria, UA: NONE SEEN
Bilirubin Urine: NEGATIVE
Glucose, UA: 50 mg/dL — AB
Ketones, ur: 5 mg/dL — AB
Leukocytes,Ua: NEGATIVE
Nitrite: NEGATIVE
Protein, ur: 100 mg/dL — AB
Specific Gravity, Urine: 1.025 (ref 1.005–1.030)
pH: 5 (ref 5.0–8.0)

## 2019-11-12 LAB — HEMOGLOBIN A1C
Hgb A1c MFr Bld: 7.7 % — ABNORMAL HIGH (ref 4.8–5.6)
Mean Plasma Glucose: 174.29 mg/dL

## 2019-11-12 LAB — CBC WITH DIFFERENTIAL/PLATELET
Abs Immature Granulocytes: 0.02 10*3/uL (ref 0.00–0.07)
Basophils Absolute: 0 10*3/uL (ref 0.0–0.1)
Basophils Relative: 0 %
Eosinophils Absolute: 0 10*3/uL (ref 0.0–0.5)
Eosinophils Relative: 0 %
HCT: 39.2 % (ref 39.0–52.0)
Hemoglobin: 13.6 g/dL (ref 13.0–17.0)
Immature Granulocytes: 0 %
Lymphocytes Relative: 14 %
Lymphs Abs: 0.6 10*3/uL — ABNORMAL LOW (ref 0.7–4.0)
MCH: 29.9 pg (ref 26.0–34.0)
MCHC: 34.7 g/dL (ref 30.0–36.0)
MCV: 86.2 fL (ref 80.0–100.0)
Monocytes Absolute: 0.2 10*3/uL (ref 0.1–1.0)
Monocytes Relative: 5 %
Neutro Abs: 3.8 10*3/uL (ref 1.7–7.7)
Neutrophils Relative %: 81 %
Platelets: 150 10*3/uL (ref 150–400)
RBC: 4.55 MIL/uL (ref 4.22–5.81)
RDW: 13 % (ref 11.5–15.5)
WBC: 4.7 10*3/uL (ref 4.0–10.5)
nRBC: 0 % (ref 0.0–0.2)

## 2019-11-12 LAB — COMPREHENSIVE METABOLIC PANEL
ALT: 32 U/L (ref 0–44)
AST: 55 U/L — ABNORMAL HIGH (ref 15–41)
Albumin: 3.3 g/dL — ABNORMAL LOW (ref 3.5–5.0)
Alkaline Phosphatase: 50 U/L (ref 38–126)
Anion gap: 13 (ref 5–15)
BUN: 43 mg/dL — ABNORMAL HIGH (ref 8–23)
CO2: 16 mmol/L — ABNORMAL LOW (ref 22–32)
Calcium: 7.9 mg/dL — ABNORMAL LOW (ref 8.9–10.3)
Chloride: 104 mmol/L (ref 98–111)
Creatinine, Ser: 1.59 mg/dL — ABNORMAL HIGH (ref 0.61–1.24)
GFR calc Af Amer: 51 mL/min — ABNORMAL LOW (ref >60)
GFR calc non Af Amer: 44 mL/min — ABNORMAL LOW (ref >60)
Glucose, Bld: 207 mg/dL — ABNORMAL HIGH (ref 70–99)
Potassium: 3.9 mmol/L (ref 3.5–5.1)
Sodium: 133 mmol/L — ABNORMAL LOW (ref 135–145)
Total Bilirubin: 1.3 mg/dL — ABNORMAL HIGH (ref 0.3–1.2)
Total Protein: 6.6 g/dL (ref 6.5–8.1)

## 2019-11-12 LAB — PROCALCITONIN: Procalcitonin: <0.1 ng/mL

## 2019-11-12 LAB — LACTIC ACID, PLASMA
Lactic Acid, Venous: 1.3 mmol/L (ref 0.5–1.9)
Lactic Acid, Venous: 3.4 mmol/L (ref 0.5–1.9)

## 2019-11-12 LAB — CBG MONITORING, ED: Glucose-Capillary: 215 mg/dL — ABNORMAL HIGH (ref 70–99)

## 2019-11-12 LAB — FIBRINOGEN: Fibrinogen: 615 mg/dL — ABNORMAL HIGH (ref 210–475)

## 2019-11-12 LAB — D-DIMER, QUANTITATIVE: D-Dimer, Quant: 2.01 ug/mL-FEU — ABNORMAL HIGH (ref 0.00–0.50)

## 2019-11-12 LAB — FERRITIN: Ferritin: 1048 ng/mL — ABNORMAL HIGH (ref 24–336)

## 2019-11-12 LAB — TRIGLYCERIDES: Triglycerides: 106 mg/dL (ref <150)

## 2019-11-12 LAB — C-REACTIVE PROTEIN: CRP: 10 mg/dL — ABNORMAL HIGH (ref <1.0)

## 2019-11-12 MED ORDER — INSULIN ASPART 100 UNIT/ML ~~LOC~~ SOLN
0.0000 [IU] | Freq: Three times a day (TID) | SUBCUTANEOUS | Status: DC
  Administered 2019-11-13: 8 [IU] via SUBCUTANEOUS
  Administered 2019-11-13: 11 [IU] via SUBCUTANEOUS
  Administered 2019-11-13: 15 [IU] via SUBCUTANEOUS
  Administered 2019-11-14: 11 [IU] via SUBCUTANEOUS
  Administered 2019-11-14: 8 [IU] via SUBCUTANEOUS
  Administered 2019-11-14: 11 [IU] via SUBCUTANEOUS
  Administered 2019-11-15 (×2): 15 [IU] via SUBCUTANEOUS
  Administered 2019-11-15: 11 [IU] via SUBCUTANEOUS
  Administered 2019-11-16: 8 [IU] via SUBCUTANEOUS
  Administered 2019-11-16: 15 [IU] via SUBCUTANEOUS
  Administered 2019-11-17: 2 [IU] via SUBCUTANEOUS
  Administered 2019-11-17: 11 [IU] via SUBCUTANEOUS
  Administered 2019-11-17 – 2019-11-18 (×2): 3 [IU] via SUBCUTANEOUS
  Administered 2019-11-18 – 2019-11-19 (×2): 2 [IU] via SUBCUTANEOUS
  Administered 2019-11-19: 3 [IU] via SUBCUTANEOUS
  Administered 2019-11-20: 8 [IU] via SUBCUTANEOUS
  Administered 2019-11-20: 5 [IU] via SUBCUTANEOUS
  Administered 2019-11-20 – 2019-11-21 (×2): 11 [IU] via SUBCUTANEOUS
  Administered 2019-11-21: 5 [IU] via SUBCUTANEOUS
  Administered 2019-11-21: 11 [IU] via SUBCUTANEOUS
  Administered 2019-11-22 (×2): 8 [IU] via SUBCUTANEOUS
  Administered 2019-11-22 – 2019-11-23 (×2): 11 [IU] via SUBCUTANEOUS
  Administered 2019-11-23: 3 [IU] via SUBCUTANEOUS
  Administered 2019-11-23: 5 [IU] via SUBCUTANEOUS
  Administered 2019-11-24: 3 [IU] via SUBCUTANEOUS
  Administered 2019-11-24: 5 [IU] via SUBCUTANEOUS
  Filled 2019-11-12: qty 0.15

## 2019-11-12 MED ORDER — INSULIN ASPART 100 UNIT/ML ~~LOC~~ SOLN
0.0000 [IU] | Freq: Every day | SUBCUTANEOUS | Status: DC
  Administered 2019-11-12: 2 [IU] via SUBCUTANEOUS
  Administered 2019-11-13 – 2019-11-20 (×4): 3 [IU] via SUBCUTANEOUS
  Administered 2019-11-21: 4 [IU] via SUBCUTANEOUS
  Administered 2019-11-22: 2 [IU] via SUBCUTANEOUS
  Administered 2019-11-23: 3 [IU] via SUBCUTANEOUS
  Filled 2019-11-12: qty 0.05

## 2019-11-12 MED ORDER — SODIUM CHLORIDE 0.9 % IV SOLN
1200.0000 mg | Freq: Once | INTRAVENOUS | Status: AC
  Administered 2019-11-12: 1200 mg via INTRAVENOUS

## 2019-11-12 MED ORDER — SODIUM CHLORIDE 0.9 % IV SOLN
100.0000 mg | Freq: Every day | INTRAVENOUS | Status: AC
  Administered 2019-11-13 – 2019-11-16 (×4): 100 mg via INTRAVENOUS
  Filled 2019-11-12 (×4): qty 20

## 2019-11-12 MED ORDER — SODIUM CHLORIDE 0.9 % IV SOLN: INTRAVENOUS | Status: DC | PRN

## 2019-11-12 MED ORDER — ACETAMINOPHEN 325 MG PO TABS: 650.0000 mg | ORAL_TABLET | Freq: Four times a day (QID) | ORAL | Status: DC | PRN

## 2019-11-12 MED ORDER — EPINEPHRINE 0.3 MG/0.3ML IJ SOAJ: 0.3000 mg | Freq: Once | INTRAMUSCULAR | Status: DC | PRN

## 2019-11-12 MED ORDER — HYDROCOD POLST-CPM POLST ER 10-8 MG/5ML PO SUER: 5.0000 mL | Freq: Two times a day (BID) | ORAL | Status: DC | PRN

## 2019-11-12 MED ORDER — ONDANSETRON HCL 4 MG PO TABS: 4.0000 mg | ORAL_TABLET | Freq: Four times a day (QID) | ORAL | Status: DC | PRN

## 2019-11-12 MED ORDER — BARICITINIB 2 MG PO TABS
2.0000 mg | ORAL_TABLET | Freq: Every day | ORAL | Status: DC
  Administered 2019-11-12 – 2019-11-18 (×7): 2 mg via ORAL
  Filled 2019-11-12 (×7): qty 1

## 2019-11-12 MED ORDER — ALBUTEROL SULFATE HFA 108 (90 BASE) MCG/ACT IN AERS: 2.0000 | INHALATION_SPRAY | Freq: Once | RESPIRATORY_TRACT | Status: DC | PRN

## 2019-11-12 MED ORDER — METHYLPREDNISOLONE SODIUM SUCC 125 MG IJ SOLR: 80.0000 mg | Freq: Two times a day (BID) | INTRAMUSCULAR | Status: DC

## 2019-11-12 MED ORDER — PREDNISONE 50 MG PO TABS: 50.0000 mg | ORAL_TABLET | Freq: Every day | ORAL | Status: DC

## 2019-11-12 MED ORDER — SODIUM BICARBONATE 650 MG PO TABS
650.0000 mg | ORAL_TABLET | Freq: Two times a day (BID) | ORAL | Status: DC
  Administered 2019-11-12 – 2019-11-19 (×14): 650 mg via ORAL
  Filled 2019-11-12 (×15): qty 1

## 2019-11-12 MED ORDER — ASPIRIN EC 81 MG PO TBEC
81.0000 mg | DELAYED_RELEASE_TABLET | Freq: Every day | ORAL | Status: DC
  Administered 2019-11-12 – 2019-11-24 (×13): 81 mg via ORAL
  Filled 2019-11-12 (×13): qty 1

## 2019-11-12 MED ORDER — METHYLPREDNISOLONE SODIUM SUCC 125 MG IJ SOLR: 125.0000 mg | Freq: Once | INTRAMUSCULAR | Status: DC | PRN

## 2019-11-12 MED ORDER — ACETAMINOPHEN 325 MG PO TABS
650.0000 mg | ORAL_TABLET | Freq: Once | ORAL | Status: AC
  Administered 2019-11-12: 650 mg via ORAL
  Filled 2019-11-12: qty 2

## 2019-11-12 MED ORDER — ADULT MULTIVITAMIN W/MINERALS CH
1.0000 | ORAL_TABLET | Freq: Every day | ORAL | Status: DC
  Administered 2019-11-13 – 2019-11-24 (×12): 1 via ORAL
  Filled 2019-11-12 (×12): qty 1

## 2019-11-12 MED ORDER — DEXAMETHASONE SODIUM PHOSPHATE 10 MG/ML IJ SOLN: 10.0000 mg | Freq: Once | INTRAMUSCULAR | Status: DC

## 2019-11-12 MED ORDER — METHYLPREDNISOLONE SODIUM SUCC 125 MG IJ SOLR
80.0000 mg | Freq: Two times a day (BID) | INTRAMUSCULAR | Status: AC
  Administered 2019-11-12 – 2019-11-15 (×6): 80 mg via INTRAVENOUS
  Filled 2019-11-12 (×6): qty 2

## 2019-11-12 MED ORDER — SODIUM CHLORIDE 0.9 % IV SOLN
200.0000 mg | Freq: Once | INTRAVENOUS | Status: AC
  Administered 2019-11-12: 200 mg via INTRAVENOUS
  Filled 2019-11-12: qty 200

## 2019-11-12 MED ORDER — METHYLPREDNISOLONE SODIUM SUCC 125 MG IJ SOLR: 50.0000 mg | Freq: Two times a day (BID) | INTRAMUSCULAR | Status: DC

## 2019-11-12 MED ORDER — DIPHENHYDRAMINE HCL 50 MG/ML IJ SOLN: 50.0000 mg | Freq: Once | INTRAMUSCULAR | Status: DC | PRN

## 2019-11-12 MED ORDER — GUAIFENESIN-DM 100-10 MG/5ML PO SYRP: 10.0000 mL | ORAL_SOLUTION | ORAL | Status: DC | PRN

## 2019-11-12 MED ORDER — ONDANSETRON HCL 4 MG/2ML IJ SOLN: 4.0000 mg | Freq: Four times a day (QID) | INTRAMUSCULAR | Status: DC | PRN

## 2019-11-12 MED ORDER — ATORVASTATIN CALCIUM 40 MG PO TABS
80.0000 mg | ORAL_TABLET | Freq: Every day | ORAL | Status: DC
  Administered 2019-11-12 – 2019-11-23 (×12): 80 mg via ORAL
  Filled 2019-11-12 (×13): qty 2

## 2019-11-12 MED ORDER — PREDNISONE 20 MG PO TABS
50.0000 mg | ORAL_TABLET | Freq: Every day | ORAL | Status: DC
  Administered 2019-11-16 – 2019-11-19 (×4): 50 mg via ORAL
  Filled 2019-11-12 (×4): qty 2

## 2019-11-12 MED ORDER — METOPROLOL SUCCINATE ER 25 MG PO TB24
25.0000 mg | ORAL_TABLET | Freq: Two times a day (BID) | ORAL | Status: DC
  Administered 2019-11-12 – 2019-11-24 (×24): 25 mg via ORAL
  Filled 2019-11-12 (×26): qty 1

## 2019-11-12 MED ORDER — ENOXAPARIN SODIUM 40 MG/0.4ML ~~LOC~~ SOLN
40.0000 mg | SUBCUTANEOUS | Status: DC
  Administered 2019-11-12 – 2019-11-23 (×12): 40 mg via SUBCUTANEOUS
  Filled 2019-11-12 (×12): qty 0.4

## 2019-11-12 MED ORDER — FAMOTIDINE IN NACL 20-0.9 MG/50ML-% IV SOLN: 20.0000 mg | Freq: Once | INTRAVENOUS | Status: DC | PRN

## 2019-11-12 NOTE — Progress Notes (Signed)
Pt now satting 89% on 6L via [place].  Ordering barcitinib due to increased O2 requirement  Increasing solumedrol to 1mg /kg with next dose.

## 2019-11-12 NOTE — ED Notes (Signed)
Pt placed on 6L [place] due to O2 sat's dropping. Dr. Alcario Drought made aware.

## 2019-11-12 NOTE — Progress Notes (Signed)
Pt arrived to infusion clinic with pre-vitals:  T 98.2 oral, BP 143/73, P 103, RR 18, O2 on RA 91. Post vitals:  T 101.7 oral, BP 134/71, P 103, RR 20, O2 88 on RA.  Pt put on 4L of oxygen via nasal canula. Prior to transfer to ED, pt 93% on 4L and RR 22. 650mg  of tylenol administered for temp.

## 2019-11-12 NOTE — ED Provider Notes (Signed)
[facility] DEPT Provider Note   CSN: [id number] Arrival date & time: 11/12/19  1701    History Hypoxia  MAKAR SLATTER is a 69 y.o. male with past medical history significant for quadruple bypass 2 years ago, diabetes, renal stones, hearing loss wearing bilateral hearing aids who presents for evaluation of hypoxia.  Tested positive for Covid at [facility] on 11/06/2019.  Has been having nonproductive cough.  He denies any chest pain or shortness of breath.  Was at infusion clinic earlier today.  On arrival his oxygen was 91% on room air however throughout infusion patient became hypoxic to 86% on room air and had some mild tachypnea.  Patient was placed on 4 L via nasal cannula.  Patient has had continued fevers, myalgias as well as diarrhea without melena or blood per rectum.  No psoas abdominal pain.  No headache, lightheadedness, congestion, rhinorrhea, hemoptysis, unilateral leg swelling, redness or warmth.  No prior history of PE or DVT.  Denies additional aggravating or alleviating factors.  History obtained from patient and past medical records.  No interpreter used.    SARS-COV-2 RNA, QL NAAT, RT PCR/TMA (COVID-19) (11/06/2019 12:11 PM EDT) SARS-COV-2 RNA, QL NAAT, RT PCR/TMA (COVID-19) (11/06/2019 12:11 PM EDT)  Component Value Ref Range Performed At Pathologist Signature  [facility] SARS-COV-2 RNA, QL NAAT, RT PCR/TMA (COVID-19) Detected (A)   Not Detected       HPI     Past Medical History:  Diagnosis Date  . Bigeminal rhythm 08/01/2017   per EKG 07-24-2017 in epic (per confirmation new since last ecg in 2007)  in care everywhere in epic ecg dated 2012 shows sinus brady rate 53 no pvc's --per pt asymptomatic and   . Bradycardia   . Coronary artery disease involving native coronary artery 08/2017   Abnormal Myoview 08/2017 (Abnormal EKG - Severe ST depressions, PVCs & nsVT). TID noted.  Moderate LAD territory Ischemic Perfusion defect --> Cath  with extensive RCA (RPDA & RPAV), Cx & LAD disease --> CABG x 4  . Family history of premature coronary artery disease    Father - MI 48. Paternal uncle x 2 -1 with MI & other with CABG in 6s  . History of contusion    ED visit 08-04-2013 , 9 days past injury ,  dx concussion w/ LOC, per CT inferior frontal lobes contusion hemarrhage-- residual headache (08-01-2017  per pt residual resolved)  . History of kidney stones   . Hypercholesterolemia   . Hypertension   . Nephrolithiasis    bilateral nonobstructive per CT 07-24-2017  . S/P CABG x 4 09/06/2017   LIMA to LAD, SVG to OM2, Sequential SVG to PDA and RPL2, EVH via right thigh and leg  . Type 2 diabetes mellitus ([place])    followed by pcp  . Ureteral calculi    left  . Wears glasses   . Wears hearing aid in both ears     Patient Active Problem List   Diagnosis Date Noted  . Acute hypoxemic respiratory failure due to COVID-19 ([place]) 11/12/2019  . Renal insufficiency 11/12/2019  . PSVT (paroxysmal supraventricular tachycardia) ([place]) 01/09/2018  . Ischemic cardiomyopathy - pre-CABG ECHO: 40-45% 09/14/2017  . S/P CABG x 4 09/06/2017  . Coronary artery disease involving native coronary artery of native heart without angina pectoris 09/03/2017  . Ventricular bigeminy 08/29/2017  . Essential hypertension 08/29/2017  . Hyperlipidemia associated with type 2 diabetes mellitus ([place]) 08/29/2017  . Family history of premature coronary  artery disease 08/29/2017  . Frequent unifocal PVCs 08/29/2017    Past Surgical History:  Procedure Laterality Date  . CORONARY ARTERY BYPASS GRAFT N/A 09/06/2017   Procedure: CORONARY ARTERY BYPASS GRAFTING (CABG) x 4, using left internal mammary artery and right leg greater saphenous vein harvested endoscopically;  Surgeon: Rexene Alberts, MD;  Location: [facility];  Service: Open Heart Surgery;; LIMA-LAD, SVG-OM2, SeqSVG-RPDA-RPL2  . CYSTOSCOPY WITH RETROGRADE PYELOGRAM, URETEROSCOPY AND STENT PLACEMENT Left  08/02/2018   Procedure: CYSTOSCOPY WITH RETROGRADE PYELOGRAM, URETEROSCOPY AND STENT PLACEMENT;  Surgeon: Cleon Gustin, MD;  Location: [facility];  Service: Urology;  Laterality: Left;  . CYSTOSCOPY/RETROGRADE/URETEROSCOPY Left 08/06/2017   Procedure: CYSTOSCOPY/RETROGRADE/URETEROSCOPY laser and stone basket extrection;  Surgeon: Cleon Gustin, MD;  Location[facility];  Service: Urology;  Laterality: Left;  . HOLMIUM LASER APPLICATION Left 08/12/5282   Procedure: HOLMIUM LASER APPLICATION;  Surgeon: Cleon Gustin, MD;  Location: [facility];  Service: Urology;  Laterality: Left;  . LAPAROSCOPIC APPENDECTOMY  11-18-2005    dr wyatt  [facility]  . LEFT HEART CATH AND CORONARY ANGIOGRAPHY N/A 09/03/2017   Procedure: LEFT HEART CATH AND CORONARY ANGIOGRAPHY;  Surgeon: Leonie Man, MD;  Location: [facility];  Service: Cardiovascular;; mRCA 70%, dRCA 99%-95% into RPAV & 90% ostRPDA 95%. RPL1 70%, distal RVAM (tandem PDA) - 85%. Ost-prox LAD 40% -pLAD 60&90% @ D1 ->p-mLAD 95% @ D2 with 70% D2. pCx 80%, p-mCx 90%-> mCx 85%.  EF 45-50%, global HK. -->> referred for CABG  . NASAL ENDOSCOPY WITH EPISTAXIS CONTROL N/A 11/29/2017   Procedure: NASAL ENDOSCOPY WITH EPISTAXIS CONTROL;  Surgeon: Leta Baptist, MD;  Location: [facility];  Service: ENT;  Laterality: N/A;  . NM MYOVIEW LTD  08/31/2017   HIGH RISK: Combination Imaging & EKG:  with  in tmedium sized, moderate severity Reversible perfusion e apdefect in  the mid and apical anterior, apical lateral walls - FINDINGS ARE C/W ISCHEMIA in the LAD territory,  however given significant arrhythmias, severe ST depressions and TID =  MV CAD is highly likely (SDS =7).   . ORCHIECTOMY  1965   unilateral -- per pt ruptured  . TEE WITHOUT CARDIOVERSION N/A 09/06/2017   Procedure: TRANSESOPHAGEAL ECHOCARDIOGRAM (TEE);  INTRA-OP.  Surgeon: Rexene Alberts, MD;  Location: [facility] OR;  Service: Open Heart Surgery:;; Normal AoV, MV & TV.   Normal RV.  Marland Kitchen TRANSTHORACIC ECHOCARDIOGRAM  09/04/2017   (pre-CABG): EF 40-45%, diffuse HK.  Difficult to assess due to frequent PVCs. Severe LA dilation. Mildly increased PAP.  Marland Kitchen TRANSTHORACIC ECHOCARDIOGRAM  01/01/2018   Post CABG: EF moderately reduced 40 and 45%.  Diffuse HK.  No identifiable R WMA.  GRII DD with severe LA dilation.  (High filling pressures).  Moderate aortic calcification-aortic sclerosis with no stenosis.  Mildly elevated PA pressures.  Marland Kitchen UMBILICAL HERNIA REPAIR  2006  . VARICOSE VEIN SURGERY  2007   left endovascular laser ablation       Family History  Problem Relation Age of Onset  . Stroke Mother   . Atrial fibrillation Mother   . Clotting disorder Father   . Heart attack Father 45       Died from complications  . Diabetes Daughter   . Heart disease Other   . Stroke Other   . Heart attack Paternal Uncle        In his 62s  . CAD Paternal Uncle        Has  had either bypass surgery or stents    Social History   Tobacco Use  . Smoking status: Never Smoker  . Smokeless tobacco: Never Used  Vaping Use  . Vaping Use: Never used  Substance Use Topics  . Alcohol use: No  . Drug use: No    Home Medications Prior to Admission medications   Medication Sig Start Date End Date Taking? Authorizing Provider  aspirin EC 81 MG tablet Take 1 tablet (81 mg total) by mouth daily. 10/25/17  Yes Leonie Man, MD  atorvastatin (LIPITOR) 80 MG tablet Take 1 tablet (80 mg total) by mouth daily at 6 PM. 06/18/19  Yes Leonie Man, MD  lisinopril (ZESTRIL) 10 MG tablet Take 1 tablet (10 mg total) by mouth daily. 03/04/19  Yes Leonie Man, MD  metFORMIN (GLUCOPHAGE) 500 MG tablet Take 500 mg by mouth 2 (two) times daily. 01/08/19  Yes [provider]  metoprolol succinate (TOPROL XL) 25 MG 24 hr tablet Take 1 tablet (25 mg total) by mouth 2 (two) times daily. NEED FOLLOW UP FOR FUTURE REFILL.. 03/24/19  Yes Leonie Man, MD  Multiple Vitamin  (MULTIVITAMIN) tablet Take 1 tablet by mouth daily.   Yes [provider]  TRULICITY 1.5 DG/6.4QI SOPN Inject 1.5 mg into the skin once a week. Take every Sunday 01/08/19  Yes [provider]  metoprolol tartrate (LOPRESSOR) 25 MG tablet Take 1 tablet (25 mg total) by mouth as needed. TAKE FOR PROLONG INCREASE HEART RATE OR PALPATIONS 01/14/19 06/18/19  Leonie Man, MD    Allergies    Patient has no known allergies.  Review of Systems   Review of Systems  Constitutional: Positive for activity change, appetite change, fatigue and fever.  HENT: Negative.   Respiratory: Positive for cough. Negative for apnea, choking, chest tightness, shortness of breath, wheezing and stridor.   Cardiovascular: Negative.   Gastrointestinal: Positive for diarrhea and nausea. Negative for abdominal distention, abdominal pain, anal bleeding, blood in stool, constipation, rectal pain and vomiting.  Genitourinary: Negative.   Musculoskeletal: Positive for myalgias.  Skin: Negative.   Neurological: Positive for weakness (Generalized).  All other systems reviewed and are negative.  Physical Exam Updated Vital Signs BP 122/78 (BP Location: Right Arm)   Pulse 85   Temp 98.5 F (36.9 C) (Oral)   Resp (!) 32   SpO2 94%   Physical Exam Vitals and nursing note reviewed.  Constitutional:      General: He is not in acute distress.    Appearance: He is well-developed. He is not ill-appearing, toxic-appearing or diaphoretic.  HENT:     Head: Normocephalic and atraumatic.     Nose: Nose normal.     Mouth/Throat:     Mouth: Mucous membranes are moist.  Eyes:     Pupils: Pupils are equal, round, and reactive to light.  Cardiovascular:     Rate and Rhythm: Normal rate and regular rhythm.     Pulses: Normal pulses.     Heart sounds: Normal heart sounds.  Pulmonary:     Effort: Pulmonary effort is normal. No respiratory distress.     Breath sounds: Normal breath sounds and air entry.      Comments: Clear to auscultation, Speaks in full sentences without difficulty Chest:     Comments: Old scar midline chest without crepitus step offs, erythema ,warmth Abdominal:     General: Bowel sounds are normal. There is no distension.     Palpations: Abdomen is  soft.     Tenderness: There is no abdominal tenderness. There is no guarding or rebound.     Hernia: No hernia is present.     Comments: Soft non tender without rebound or guarding  Musculoskeletal:        General: Normal range of motion.     Cervical back: Normal range of motion and neck supple.     Comments: Moves all 4 extremities without difficulty. Compartments soft. Homans negative. No bony tenderness  Skin:    General: Skin is warm and dry.     Capillary Refill: Capillary refill takes less than 2 seconds.     Comments: Brisk cap refill  Neurological:     Mental Status: He is alert.     Cranial Nerves: Cranial nerves are intact.     Sensory: Sensation is intact.     Motor: Motor function is intact.     Comments: CN 2-12 grossly intact  Ambulatory without ataxic gait    ED Results / Procedures / Treatments   Labs (all labs ordered are listed, but only abnormal results are displayed) Labs Reviewed  CBC WITH DIFFERENTIAL/PLATELET - Abnormal; Notable for the following components:      Result Value   Lymphs Abs 0.6 (*)    All other components within normal limits  COMPREHENSIVE METABOLIC PANEL - Abnormal; Notable for the following components:   Sodium 133 (*)    CO2 16 (*)    Glucose, Bld 207 (*)    BUN 43 (*)    Creatinine, Ser 1.59 (*)    Calcium 7.9 (*)    Albumin 3.3 (*)    AST 55 (*)    Total Bilirubin 1.3 (*)    GFR calc non Af Amer 44 (*)    GFR calc Af Amer 51 (*)    All other components within normal limits  D-DIMER, QUANTITATIVE (NOT AT [facility]) - Abnormal; Notable for the following components:   D-Dimer, Quant 2.01 (*)    All other components within normal limits  LACTATE DEHYDROGENASE - Abnormal;  Notable for the following components:   LDH 367 (*)    All other components within normal limits  FERRITIN - Abnormal; Notable for the following components:   Ferritin 1,048 (*)    All other components within normal limits  FIBRINOGEN - Abnormal; Notable for the following components:   Fibrinogen 615 (*)    All other components within normal limits  C-REACTIVE PROTEIN - Abnormal; Notable for the following components:   CRP 10.0 (*)    All other components within normal limits  CULTURE, BLOOD (ROUTINE X 2)  CULTURE, BLOOD (ROUTINE X 2)  LACTIC ACID, PLASMA  PROCALCITONIN  TRIGLYCERIDES  LACTIC ACID, PLASMA  HIV ANTIBODY (ROUTINE TESTING W REFLEX)  CBC WITH DIFFERENTIAL/PLATELET  COMPREHENSIVE METABOLIC PANEL  C-REACTIVE PROTEIN  D-DIMER, QUANTITATIVE (NOT AT [facility])  HEMOGLOBIN A1C  URINALYSIS, ROUTINE W REFLEX MICROSCOPIC    EKG None  Radiology DG Chest Portable 1 View  Result Date: 11/12/2019 CLINICAL DATA:  COVID hypoxia EXAM: PORTABLE CHEST 1 VIEW COMPARISON:  Chest chest x-ray 01/04/2018 FINDINGS: The heart size and mediastinal contours are unchanged. Aortic arch calcifications. Scattered hazy bilateral lower lung zone airspace opacities. No pulmonary edema. No pleural effusion. No pneumothorax. No acute osseous abnormality. Sternotomy wires and surgical changes overlie the mediastinum. IMPRESSION: Scattered hazy bilateral lower lung zone airspace opacities may represent infection versus atelectasis. Electronically Signed   By: Iven Finn M.D.   On: 11/12/2019 19:14  Procedures .Critical Care Performed by: Nettie Elm, PA-C Authorized by: Nettie Elm, PA-C   Critical care provider statement:    Critical care time (minutes):  45   Critical care was necessary to treat or prevent imminent or life-threatening deterioration of the following conditions:  Respiratory failure   Critical care was time spent personally by me on the following activities:   Discussions with consultants, evaluation of patient's response to treatment, examination of patient, ordering and performing treatments and interventions, ordering and review of laboratory studies, ordering and review of radiographic studies, pulse oximetry, re-evaluation of patient's condition, obtaining history from patient or surrogate and review of old charts   (including critical care time)  Medications Ordered in ED Medications  remdesivir 200 mg in sodium chloride 0.9% 250 mL IVPB (has no administration in time range)    Followed by  remdesivir 100 mg in sodium chloride 0.9 % 100 mL IVPB (has no administration in time range)  methylPREDNISolone sodium succinate (SOLU-MEDROL) injection 0.5 mg/kg (has no administration in time range)    Followed by  predniSONE (DELTASONE) tablet 50 mg (has no administration in time range)  guaiFENesin-dextromethorphan (ROBITUSSIN DM) 100-10 MG/5ML syrup 10 mL (has no administration in time range)  chlorpheniramine-HYDROcodone (TUSSIONEX) 10-8 MG/5ML suspension 5 mL (has no administration in time range)  acetaminophen (TYLENOL) tablet 650 mg (has no administration in time range)  ondansetron (ZOFRAN) tablet 4 mg (has no administration in time range)    Or  ondansetron (ZOFRAN) injection 4 mg (has no administration in time range)  enoxaparin (LOVENOX) injection 40 mg (has no administration in time range)  insulin aspart (novoLOG) injection 0-15 Units (has no administration in time range)  insulin aspart (novoLOG) injection 0-5 Units (has no administration in time range)  metoprolol succinate (TOPROL-XL) 24 hr tablet 25 mg (has no administration in time range)  atorvastatin (LIPITOR) tablet 80 mg (has no administration in time range)  aspirin EC tablet 81 mg (has no administration in time range)  multivitamin with minerals tablet 1 tablet (has no administration in time range)  sodium bicarbonate tablet 650 mg (has no administration in time range)    ED  Course  I have reviewed the triage vital signs and the nursing notes.  Pertinent labs & imaging results that were available during my care of the patient were reviewed by me and considered in my medical decision making (see chart for details).   69 year old male presents for evaluation of hypoxia.  Known Covid positive, diagnosed 1 week ago.  Seen at [facility] infusion clinic earlier today noted to become gradually hypoxic throughout infusion to mid 80's on RA.  Placed on 4 L via nasal cannula with Oxygen to 93%.  Patient denies any chest pain, shortness of breath.  He is post four-vessel CABG 2 years ago.  No unilateral leg swelling, redness or warmth.  Bevelyn Buckles' sign negative.  Clinically no evidence of DVT on exam.  He was febrile with may be clinic and subsequently given Tylenol.  His abdomen is soft, nontender.  Plan on labs, imaging and reassess.  Labs and imaging personally reviewed and interpreted:  CBC No leukocytosis CMP hyponatremia at 133 Lactic Ddimer 2.01 Dg chest EKG without STEMI, does have PVC consistent with prior EKGs  Patient reassessed.  Discussed inpatient management.  He is agreeable to this.  No respiratory distress on 4 L via nasal cannula.  Labs consistent with Covid infection.  We will plan admission for acute hypoxic respiratory failure due to  Covid pneumonia.  I have low suspicion for PE, ACS at this time.  Patient defervesced with Tylenol.  CONSULT with Dr. Alcario Drought with [facility] who agrees to evaluate patient for admission.  The patient appears reasonably stabilized for admission considering the current resources, flow, and capabilities available in the ED at this time, and I doubt any other [facility] requiring further screening and/or treatment in the ED prior to admission.     MDM Rules/Calculators/A&P                          THAMAS APPLEYARD was evaluated in Emergency Department on 11/12/2019 for the symptoms described in the history of present illness. He was evaluated in  the context of the global COVID-19 pandemic, which necessitated consideration that the patient might be at risk for infection with the SARS-CoV-2 virus that causes COVID-19. Institutional protocols and algorithms that pertain to the evaluation of patients at risk for COVID-19 are in a state of rapid change based on information released by regulatory bodies including the CDC and federal and state organizations. These policies and algorithms were followed during the patient's care in the ED.  Final Clinical Impression(s) / ED Diagnoses Final diagnoses:  Pneumonia due to COVID-19 virus  Elevated d-dimer  Acute respiratory failure with hypoxia [facility])    Rx / DC Orders ED Discharge Orders    None       Jeriann Sayres A, PA-C 11/12/19 2055    Gareth Morgan, MD 11/14/19 [phone]

## 2019-11-12 NOTE — ED Triage Notes (Signed)
Pt presented to the infusion center for monoclonal antibodies.  Pt was able to get infusion but after the infusion, pt's O2 dropped to 86% RA.  Pt placed on 3L [place].  Pt did have a temp of 101.7 and 650mg  tylenolol.  Hx DM, HTN

## 2019-11-12 NOTE — Discharge Instructions (Signed)

## 2019-11-12 NOTE — ED Notes (Signed)
Dr. Gardner at bedside 

## 2019-11-12 NOTE — H&P (Signed)
History and Physical    Taylor [name] MEQ:[id number] DOB: 09/27/1950 DOA: 11/12/2019  PCP: Haywood Pao, MD  Patient coming from: Home  I have personally briefly reviewed patient's old medical records in [place]  Chief Complaint: Hypoxia  HPI: Taylor [name] is a 69 y.o. male with medical history significant of CAD s/p CABG in 2019, ICM with EF of 40-45% and G2 DD, kidney stones, DM2, HTN, HLD.  Pt presents to the ED with c/o hypoxia.  Pt has been having nonproductive cough for about a week.  [facility]  Went to [place] infusion clinic earlier today.  Got MAB infusion but during infusion became hypoxic to 86% on RA.  Put on 4L with improvement in sat and sent to ED.  Patient has had continued fevers, myalgias as well as diarrhea without melena or blood per rectum.  No abd pain.  No h/o PE nor DVT.   ED Course: Creat 1.59.  Tm 101.7, RR 32, satting 94% on 4L  CRP 10, procalcitonin neg, WBC nl.  D.Dimer 2.0.  Positive COVID test is visible as [facility] in [facility].  CXR shows scattered bilateral lower lung zone opacities.  Bicarb 16, lactate 1.3.   Review of Systems: As per HPI, otherwise all review of systems negative.  Past Medical History:  Diagnosis Date  . Bigeminal rhythm 08/01/2017   per EKG 07-24-2017 in epic (per confirmation new since last ecg in 2007)  in care everywhere in epic ecg dated 2012 shows sinus brady rate 53 no pvc's --per pt asymptomatic and   . Bradycardia   . Coronary artery disease involving native coronary artery 08/2017   Abnormal Myoview 08/2017 (Abnormal EKG - Severe ST depressions, PVCs & nsVT). TID noted.  Moderate LAD territory Ischemic Perfusion defect --> Cath with extensive RCA (RPDA & RPAV), Cx & LAD disease --> CABG x 4  . Family history of premature coronary artery disease    Father - MI 49. Paternal uncle x 2 -1 with MI & other with CABG in 72s  . History of contusion     ED visit 08-04-2013 , 9 days past injury ,  dx concussion w/ LOC, per CT inferior frontal lobes contusion hemarrhage-- residual headache (08-01-2017  per pt residual resolved)  . History of kidney stones   . Hypercholesterolemia   . Hypertension   . Nephrolithiasis    bilateral nonobstructive per CT 07-24-2017  . S/P CABG x 4 09/06/2017   LIMA to LAD, SVG to OM2, Sequential SVG to PDA and RPL2, EVH via right thigh and leg  . Type 2 diabetes mellitus ([place])    followed by pcp  . Ureteral calculi    left  . Wears glasses   . Wears hearing aid in both ears     Past Surgical History:  Procedure Laterality Date  . CORONARY ARTERY BYPASS GRAFT N/A 09/06/2017   Procedure: CORONARY ARTERY BYPASS GRAFTING (CABG) x 4, using left internal mammary artery and right leg greater saphenous vein harvested endoscopically;  Surgeon: Rexene Alberts, MD;  Location: [facility];  Service: Open Heart Surgery;; LIMA-LAD, SVG-OM2, SeqSVG-RPDA-RPL2  . CYSTOSCOPY WITH RETROGRADE PYELOGRAM, URETEROSCOPY AND STENT PLACEMENT Left 08/02/2018   Procedure: CYSTOSCOPY WITH RETROGRADE PYELOGRAM, URETEROSCOPY AND STENT PLACEMENT;  Surgeon: Cleon Gustin, MD;  Location: [facility];  Service: Urology;  Laterality: Left;  . CYSTOSCOPY/RETROGRADE/URETEROSCOPY Left 08/06/2017   Procedure: CYSTOSCOPY/RETROGRADE/URETEROSCOPY laser and stone basket extrection;  Surgeon: Cleon Gustin, MD;  Location: [facility];  Service: Urology;  Laterality: Left;  . HOLMIUM LASER APPLICATION Left 5/78/4696   Procedure: HOLMIUM LASER APPLICATION;  Surgeon: Cleon Gustin, MD;  Location: [facility];  Service: Urology;  Laterality: Left;  . LAPAROSCOPIC APPENDECTOMY  11-18-2005    dr wyatt  [facility]  . LEFT HEART CATH AND CORONARY ANGIOGRAPHY N/A 09/03/2017   Procedure: LEFT HEART CATH AND CORONARY ANGIOGRAPHY;  Surgeon: Leonie Man, MD;  Location: [facility];  Service: Cardiovascular;; mRCA 70%, dRCA 99%-95% into RPAV & 90%  ostRPDA 95%. RPL1 70%, distal RVAM (tandem PDA) - 85%. Ost-prox LAD 40% -pLAD 60&90% @ D1 ->p-mLAD 95% @ D2 with 70% D2. pCx 80%, p-mCx 90%-> mCx 85%.  EF 45-50%, global HK. -->> referred for CABG  . NASAL ENDOSCOPY WITH EPISTAXIS CONTROL N/A 11/29/2017   Procedure: NASAL ENDOSCOPY WITH EPISTAXIS CONTROL;  Surgeon: Leta Baptist, MD;  Location: [facility];  Service: ENT;  Laterality: N/A;  . NM MYOVIEW LTD  08/31/2017   HIGH RISK: Combination Imaging & EKG:  with  in tmedium sized, moderate severity Reversible perfusion e apdefect in  the mid and apical anterior, apical lateral walls - FINDINGS ARE C/W ISCHEMIA in the LAD territory,  however given significant arrhythmias, severe ST depressions and TID =  MV CAD is highly likely (SDS =7).   . ORCHIECTOMY  1965   unilateral -- per pt ruptured  . TEE WITHOUT CARDIOVERSION N/A 09/06/2017   Procedure: TRANSESOPHAGEAL ECHOCARDIOGRAM (TEE);  INTRA-OP.  Surgeon: Rexene Alberts, MD;  Location: [facility] OR;  Service: Open Heart Surgery:;; Normal AoV, MV & TV.  Normal RV.  Marland Kitchen TRANSTHORACIC ECHOCARDIOGRAM  09/04/2017   (pre-CABG): EF 40-45%, diffuse HK.  Difficult to assess due to frequent PVCs. Severe LA dilation. Mildly increased PAP.  Marland Kitchen TRANSTHORACIC ECHOCARDIOGRAM  01/01/2018   Post CABG: EF moderately reduced 40 and 45%.  Diffuse HK.  No identifiable R WMA.  GRII DD with severe LA dilation.  (High filling pressures).  Moderate aortic calcification-aortic sclerosis with no stenosis.  Mildly elevated PA pressures.  Marland Kitchen UMBILICAL HERNIA REPAIR  2006  . VARICOSE VEIN SURGERY  2007   left endovascular laser ablation     reports that he has never smoked. He has never used smokeless tobacco. He reports that he does not drink alcohol and does not use drugs.  No Known Allergies  Family History  Problem Relation Age of Onset  . Stroke Mother   . Atrial fibrillation Mother   . Clotting disorder Father   . Heart attack Father 81       Died from  complications  . Diabetes Daughter   . Heart disease Other   . Stroke Other   . Heart attack Paternal Uncle        In his 3s  . CAD Paternal Uncle        Has had either bypass surgery or stents     Prior to Admission medications   Medication Sig Start Date End Date Taking? Authorizing Provider  aspirin EC 81 MG tablet Take 1 tablet (81 mg total) by mouth daily. 10/25/17  Yes Leonie Man, MD  atorvastatin (LIPITOR) 80 MG tablet Take 1 tablet (80 mg total) by mouth daily at 6 PM. 06/18/19  Yes Leonie Man, MD  lisinopril (ZESTRIL) 10 MG tablet Take 1 tablet (10 mg total) by mouth daily. 03/04/19  Yes Leonie Man, MD  metFORMIN (GLUCOPHAGE) 500 MG tablet Take 500 mg by mouth 2 (two) times daily. 01/08/19  Yes [provider]  metoprolol succinate (TOPROL XL) 25 MG 24 hr tablet Take 1 tablet (25 mg total) by mouth 2 (two) times daily. NEED FOLLOW UP FOR FUTURE REFILL.. 03/24/19  Yes Leonie Man, MD  Multiple Vitamin (MULTIVITAMIN) tablet Take 1 tablet by mouth daily.   Yes [provider]  TRULICITY 1.5 QJ/1.9ER SOPN Inject 1.5 mg into the skin once a week. Take every Sunday 01/08/19  Yes [provider]  metoprolol tartrate (LOPRESSOR) 25 MG tablet Take 1 tablet (25 mg total) by mouth as needed. TAKE FOR PROLONG INCREASE HEART RATE OR PALPATIONS 01/14/19 06/18/19  Leonie Man, MD    Physical Exam: Vitals:   11/12/19 1742 11/12/19 1743 11/12/19 1847 11/12/19 1945  BP:  128/76  122/78  Pulse:  94  85  Resp:  (!) 26  (!) 32  Temp:   98.5 F (36.9 C)   TempSrc:   Oral   SpO2: 92% 93%  94%    Constitutional: Awake, alert, pt with chills and tremors Eyes: PERRL, lids and conjunctivae normal ENMT: Mucous membranes are moist. Posterior pharynx clear of any exudate or lesions.Normal dentition.  Neck: normal, supple, no masses, no thyromegaly Respiratory: clear to auscultation bilaterally, no wheezing, no crackles. Normal respiratory effort. No  accessory muscle use.  Cardiovascular: Regular rate and rhythm, no murmurs / rubs / gallops. No extremity edema. 2+ pedal pulses. No carotid bruits.  Abdomen: no tenderness, no masses palpated. No hepatosplenomegaly. Bowel sounds positive.  Musculoskeletal: no clubbing / cyanosis. No joint deformity upper and lower extremities. Good ROM, no contractures. Normal muscle tone.  Skin: no rashes, lesions, ulcers. No induration Neurologic: CN 2-12 grossly intact. Sensation intact, DTR normal. Strength 5/5 in all 4.  Psychiatric: Normal judgment and insight. Alert and oriented x 3. Normal mood.    Labs on Admission: I have personally reviewed following labs and imaging studies  CBC: Recent Labs  Lab 11/12/19 1807  WBC 4.7  NEUTROABS 3.8  HGB 13.6  HCT 39.2  MCV 86.2  PLT 740   Basic Metabolic Panel: Recent Labs  Lab 11/12/19 1807  NA 133*  K 3.9  CL 104  CO2 16*  GLUCOSE 207*  BUN 43*  CREATININE 1.59*  CALCIUM 7.9*   GFR: CrCl cannot be calculated (Unknown ideal weight.). Liver Function Tests: Recent Labs  Lab 11/12/19 1807  AST 55*  ALT 32  ALKPHOS 50  BILITOT 1.3*  PROT 6.6  ALBUMIN 3.3*   No results for input(s): LIPASE, AMYLASE in the last 168 hours. No results for input(s): AMMONIA in the last 168 hours. Coagulation Profile: No results for input(s): INR, PROTIME in the last 168 hours. Cardiac Enzymes: No results for input(s): CKTOTAL, CKMB, CKMBINDEX, TROPONINI in the last 168 hours. BNP (last 3 results) No results for input(s): PROBNP in the last 8760 hours. HbA1C: No results for input(s): HGBA1C in the last 72 hours. CBG: No results for input(s): GLUCAP in the last 168 hours. Lipid Profile: Recent Labs    11/12/19 1810  TRIG 106   Thyroid Function Tests: No results for input(s): TSH, T4TOTAL, FREET4, T3FREE, THYROIDAB in the last 72 hours. Anemia Panel: Recent Labs    11/12/19 1808  FERRITIN 1,048*   Urine analysis:    Component Value  Date/Time   COLORURINE YELLOW 09/04/2017 [place] 09/04/2017 0946   LABSPEC 1.010 09/04/2017 0946  PHURINE 7.0 09/04/2017 0946   GLUCOSEU NEGATIVE 09/04/2017 0946   HGBUR TRACE (A) 09/04/2017 0946   BILIRUBINUR NEGATIVE 09/04/2017 0946   KETONESUR NEGATIVE 09/04/2017 0946   PROTEINUR 30 (A) 09/04/2017 0946   UROBILINOGEN 1.0 03/16/2007 1916   NITRITE NEGATIVE 09/04/2017 0946   LEUKOCYTESUR TRACE (A) 09/04/2017 0946    Radiological Exams on Admission: DG Chest Portable 1 View  Result Date: 11/12/2019 CLINICAL DATA:  COVID hypoxia EXAM: PORTABLE CHEST 1 VIEW COMPARISON:  Chest chest x-ray 01/04/2018 FINDINGS: The heart size and mediastinal contours are unchanged. Aortic arch calcifications. Scattered hazy bilateral lower lung zone airspace opacities. No pulmonary edema. No pleural effusion. No pneumothorax. No acute osseous abnormality. Sternotomy wires and surgical changes overlie the mediastinum. IMPRESSION: Scattered hazy bilateral lower lung zone airspace opacities may represent infection versus atelectasis. Electronically Signed   By: Iven Finn M.D.   On: 11/12/2019 19:14    EKG: Independently reviewed.  Assessment/Plan Principal Problem:   Acute hypoxemic respiratory failure due to COVID-19 [facility]) Active Problems:   Essential hypertension   Hyperlipidemia associated with type 2 diabetes mellitus ([place])   Coronary artery disease involving native coronary artery of native heart without angina pectoris   Ischemic cardiomyopathy - pre-CABG ECHO: 40-45%   Renal insufficiency    1. Acute hypoxic resp failure due to COVID-19 1. COVID pathway 2. remdesivir 3. Solumedrol 4. Holding off on actemra / barcitinib for the moment unless O2 requirement increases to 6L or more. 5. Cont pulse ox and tele monitor 6. Daily labs 2. Renal insufficiency - 1. Looks like he may just have underlying CKD 3 not formally diagnosed. 2. But with BUN elevation from baseline today  and mild NAG acidosis, will hold ACEi for the moment 1. Lactate level nl 3. Repeat CMP in AM 4. Strict intake and output 5. Will start pt on PO bicarb 650mg  BID for the moment 6. Check UA 7. May need renal US or other imaging in AM depending on what renal function doing 3. HTN - 1. Cont BB 2. Hold ACEi 4. DM2 - 1. Hold home meds 2. Mod scale SSI AC/HS 5. CAD and ICM - 1. Cont statin 2. Cont ASA 3. Cont BB 4. Hold ACEi  DVT prophylaxis: Lovenox Code Status: Full Family Communication: No family in room Disposition Plan: Home after O2 requirement improved Consults called: None Admission status: Admit to inpatient  Severity of Illness: The appropriate patient status for this patient is INPATIENT. Inpatient status is judged to be reasonable and necessary in order to provide the required intensity of service to ensure the patient's safety. The patient's presenting symptoms, physical exam findings, and initial radiographic and laboratory data in the context of their chronic comorbidities is felt to place them at high risk for further clinical deterioration. Furthermore, it is not anticipated that the patient will be medically stable for discharge from the hospital within 2 midnights of admission. The following factors support the patient status of inpatient.   IP status due to new O2 requirement associated with COVID-19.   * I certify that at the point of admission it is my clinical judgment that the patient will require inpatient hospital care spanning beyond 2 midnights from the point of admission due to high intensity of service, high risk for further deterioration and high frequency of surveillance required.*    Davy Faught M. DO [facility]  How to contact the [facility] Attending or Consulting provider [place] or covering provider during after hours  7P -7A, for this patient?  1. Check the care team in [facility] and look for a) attending/consulting TR[facility] provider listed and b) the  [facility] team listed 2. Log into [email or web address]  Amion Physician Scheduling and messaging for groups and whole hospitals  On call and physician scheduling software for group practices, residents, hospitalists and other medical providers for call, clinic, rotation and shift schedules. On[employer] is a hospital-wide system for scheduling doctors and paging doctors on call. EasyPlot is for scientific plotting and data analysis.  [email or web address]  and use [place]'s universal password to access. If you do not have the password, please contact the hospital operator.  3. Locate the [facility] provider you are looking for under [facility] and page to a number that you can be directly reached. 4. If you still have difficulty reaching the provider, please page the [facility] (Director on Call) for the Hospitalists listed on amion for assistance.  11/12/2019, 8:40 PM

## 2019-11-13 DIAGNOSIS — R7989 Other specified abnormal findings of blood chemistry: Secondary | ICD-10-CM

## 2019-11-13 DIAGNOSIS — U071 COVID-19: Secondary | ICD-10-CM | POA: Diagnosis not present

## 2019-11-13 DIAGNOSIS — J9601 Acute respiratory failure with hypoxia: Secondary | ICD-10-CM | POA: Diagnosis not present

## 2019-11-13 DIAGNOSIS — I2510 Atherosclerotic heart disease of native coronary artery without angina pectoris: Secondary | ICD-10-CM | POA: Diagnosis not present

## 2019-11-13 LAB — LACTATE DEHYDROGENASE: LDH: 416 U/L — ABNORMAL HIGH (ref 98–192)

## 2019-11-13 LAB — COMPREHENSIVE METABOLIC PANEL
ALT: 42 U/L (ref 0–44)
AST: 81 U/L — ABNORMAL HIGH (ref 15–41)
Albumin: 3.7 g/dL (ref 3.5–5.0)
Alkaline Phosphatase: 56 U/L (ref 38–126)
Anion gap: 19 — ABNORMAL HIGH (ref 5–15)
BUN: 46 mg/dL — ABNORMAL HIGH (ref 8–23)
CO2: 17 mmol/L — ABNORMAL LOW (ref 22–32)
Calcium: 8.5 mg/dL — ABNORMAL LOW (ref 8.9–10.3)
Chloride: 102 mmol/L (ref 98–111)
Creatinine, Ser: 1.83 mg/dL — ABNORMAL HIGH (ref 0.61–1.24)
GFR calc Af Amer: 43 mL/min — ABNORMAL LOW (ref >60)
GFR calc non Af Amer: 37 mL/min — ABNORMAL LOW (ref >60)
Glucose, Bld: 258 mg/dL — ABNORMAL HIGH (ref 70–99)
Potassium: 4.2 mmol/L (ref 3.5–5.1)
Sodium: 138 mmol/L (ref 135–145)
Total Bilirubin: 1.3 mg/dL — ABNORMAL HIGH (ref 0.3–1.2)
Total Protein: 7.5 g/dL (ref 6.5–8.1)

## 2019-11-13 LAB — CBC WITH DIFFERENTIAL/PLATELET
Abs Immature Granulocytes: 0.04 10*3/uL (ref 0.00–0.07)
Basophils Absolute: 0 10*3/uL (ref 0.0–0.1)
Basophils Relative: 0 %
Eosinophils Absolute: 0 10*3/uL (ref 0.0–0.5)
Eosinophils Relative: 0 %
HCT: 41.4 % (ref 39.0–52.0)
Hemoglobin: 14.1 g/dL (ref 13.0–17.0)
Immature Granulocytes: 1 %
Lymphocytes Relative: 12 %
Lymphs Abs: 1 10*3/uL (ref 0.7–4.0)
MCH: 29.6 pg (ref 26.0–34.0)
MCHC: 34.1 g/dL (ref 30.0–36.0)
MCV: 87 fL (ref 80.0–100.0)
Monocytes Absolute: 0.2 10*3/uL (ref 0.1–1.0)
Monocytes Relative: 2 %
Neutro Abs: 6.9 10*3/uL (ref 1.7–7.7)
Neutrophils Relative %: 85 %
Platelets: 164 10*3/uL (ref 150–400)
RBC: 4.76 MIL/uL (ref 4.22–5.81)
RDW: 13 % (ref 11.5–15.5)
WBC: 8.1 10*3/uL (ref 4.0–10.5)
nRBC: 0 % (ref 0.0–0.2)

## 2019-11-13 LAB — CBG MONITORING, ED
Glucose-Capillary: 286 mg/dL — ABNORMAL HIGH (ref 70–99)
Glucose-Capillary: 374 mg/dL — ABNORMAL HIGH (ref 70–99)

## 2019-11-13 LAB — MAGNESIUM: Magnesium: 2 mg/dL (ref 1.7–2.4)

## 2019-11-13 LAB — FERRITIN: Ferritin: 1069 ng/mL — ABNORMAL HIGH (ref 24–336)

## 2019-11-13 LAB — PHOSPHORUS: Phosphorus: 3.8 mg/dL (ref 2.5–4.6)

## 2019-11-13 LAB — HIV ANTIBODY (ROUTINE TESTING W REFLEX): HIV Screen 4th Generation wRfx: NONREACTIVE

## 2019-11-13 LAB — D-DIMER, QUANTITATIVE: D-Dimer, Quant: 3.44 ug/mL-FEU — ABNORMAL HIGH (ref 0.00–0.50)

## 2019-11-13 LAB — C-REACTIVE PROTEIN: CRP: 12.7 mg/dL — ABNORMAL HIGH (ref <1.0)

## 2019-11-13 MED ORDER — INSULIN DETEMIR 100 UNIT/ML ~~LOC~~ SOLN
10.0000 [IU] | Freq: Every day | SUBCUTANEOUS | Status: DC
  Administered 2019-11-13 – 2019-11-15 (×3): 10 [IU] via SUBCUTANEOUS
  Filled 2019-11-13 (×3): qty 0.1

## 2019-11-13 MED ORDER — ZINC SULFATE 220 (50 ZN) MG PO CAPS
220.0000 mg | ORAL_CAPSULE | Freq: Every day | ORAL | Status: DC
  Administered 2019-11-13 – 2019-11-24 (×12): 220 mg via ORAL
  Filled 2019-11-13 (×12): qty 1

## 2019-11-13 MED ORDER — IPRATROPIUM-ALBUTEROL 20-100 MCG/ACT IN AERS
1.0000 | INHALATION_SPRAY | Freq: Four times a day (QID) | RESPIRATORY_TRACT | Status: DC
  Administered 2019-11-13 – 2019-11-16 (×13): 1 via RESPIRATORY_TRACT
  Filled 2019-11-13: qty 4

## 2019-11-13 MED ORDER — ASCORBIC ACID 500 MG PO TABS
500.0000 mg | ORAL_TABLET | Freq: Every day | ORAL | Status: DC
  Administered 2019-11-13 – 2019-11-24 (×12): 500 mg via ORAL
  Filled 2019-11-13 (×12): qty 1

## 2019-11-13 MED ORDER — INSULIN ASPART 100 UNIT/ML ~~LOC~~ SOLN
5.0000 [IU] | Freq: Three times a day (TID) | SUBCUTANEOUS | Status: DC
  Administered 2019-11-14 – 2019-11-15 (×5): 5 [IU] via SUBCUTANEOUS
  Filled 2019-11-13: qty 0.05

## 2019-11-13 NOTE — ED Notes (Signed)
CBG 287  

## 2019-11-13 NOTE — ED Notes (Signed)
Bedside commode placed at bedside for patient to use

## 2019-11-13 NOTE — Progress Notes (Signed)
PROGRESS NOTE    Taylor [name]  BWL:[id number] DOB: 1950-07-25 DOA: 11/12/2019 PCP: Haywood Pao, MD     Brief Narrative:  Taylor [name] is a 69 y.o.WM PMHx CAD s/p CABG in 2019, ischemic cardiomyopathy with EF of 40-45%/diastolic CHF grade 2, HTN, nephrolithiasis, DM type II trolled with complication, HLD.  Pt presents to the ED with c/o hypoxia.  Pt has been having nonproductive cough for about a week.  [facility]  Went to [place] infusion clinic earlier today.  Got MAB infusion but during infusion became hypoxic to 86% on RA.  Put on 4L with improvement in sat and sent to ED.  Patient has had continued fevers, myalgias as well as diarrhea without melena or blood per rectum.  No abd pain.  No h/o PE nor DVT.   Subjective: Overnight T-max 38.7C, A/O x4, positive S OB, negative CP, negative abdominal pain, negative nausea, negative vomiting.    Assessment & Plan: Covid vaccination;   Principal Problem:   Acute hypoxemic respiratory failure due to COVID-19 [facility]) Active Problems:   Essential hypertension   Hyperlipidemia associated with type 2 diabetes mellitus ([place])   Coronary artery disease involving native coronary artery of native heart without angina pectoris   Ischemic cardiomyopathy - pre-CABG ECHO: 40-45%   Renal insufficiency  Severe sepsis -On admission patient meets guidelines RR> 20, temp> 38 C, site of infection long, lactic acid> 2   Acute respiratory failure with hypoxia/Covid pneumonia COVID-19 Labs  Recent Labs    11/12/19 1807 11/12/19 1808 11/13/19 0152  DDIMER 2.01*  --  3.44*  FERRITIN  --  1,048*  --   LDH 367*  --   --   CRP  --  10.0* 12.7*    11/06/[facility] -Baricitinib 2 mg daily x14 days per pharmacy protocol -Solu-Medrol 80 mg BID -Remdesivir per pharmacy protocol -Vitamin C and zinc per Covid protocol -Respimat QID -Titrate O2 to maintain SPO2> 88% -Prone  patient 8 hours/day; if patient cannot tolerate prone 2 to 3 hours per shift  Ischemic cardiomyopathy/CAD -Strict ins and out -Daily weight -Toprol 25 mg BID  HTN -See cardiomyopathy  HLD -Lipitor 80 mg daily -Lipid panel pending  DM type II uncontrolled with complication -6/81 hemoglobin A1c= 7.7 -Levemir 10 units daily -NovoLog 5 units qac -Moderate SSI  1. Renal insufficiency - 1. Looks like he may just have underlying CKD 3 not formally diagnosed. 2. But with BUN elevation from baseline today and mild NAG acidosis, will hold ACEi for the moment 1. Lactate level nl 3. Repeat CMP in AM 4. Strict intake and output 5. Will start pt on PO bicarb 650mg  BID for the moment 6. Check UA 7. May need renal US or other imaging in AM depending on what renal function doing     DVT prophylaxis: Lovenox Code Status: Full Family Communication:  Status is: Inpatient    Dispo: The patient is from: Home              Anticipated d/c is to: Home              Anticipated d/c date is: 10/4              Patient currently unstable      Consultants:    Procedures/Significant Events:    I have personally reviewed and interpreted all radiology studies and my findings are as above.  VENTILATOR SETTINGS: Nasal  cannula 9/30 Flow; 6 L/min SPO2; 91%   Cultures   Antimicrobials: Anti-infectives (From admission, onward)   Start     Dose/Rate Route Frequency Ordered Stop   11/13/19 1000  remdesivir 100 mg in sodium chloride 0.9 % 100 mL IVPB       "Followed by" Linked Group Details   100 mg 200 mL/hr over 30 Minutes Intravenous Daily 11/12/19 2035 11/17/19 0959   11/12/19 2100  remdesivir 200 mg in sodium chloride 0.9% 250 mL IVPB       "Followed by" Linked Group Details   200 mg 580 mL/hr over 30 Minutes Intravenous Once 11/12/19 2035 11/12/19 2239       Devices    LINES / TUBES:      Continuous Infusions: . remdesivir 100 mg in NS 100 mL        Objective: Vitals:   11/13/19 0133 11/13/19 0300 11/13/19 0500 11/13/19 0600  BP: (!) 159/71 138/72 123/71 130/78  Pulse: (!) 104 90 80 74  Resp: (!) 41 (!) 42 (!) 36 (!) 37  Temp:      TempSrc:      SpO2: (!) 89% 91% 91% 91%  Weight:        Intake/Output Summary (Last 24 hours) at 11/13/2019 0750 Last data filed at 11/13/2019 0153 Gross per 24 hour  Intake 300 ml  Output --  Net 300 ml   Filed Weights   11/12/19 2202  Weight: 87.1 kg    Examination:  General: A/O x4, No acute respiratory distress Eyes: negative scleral hemorrhage, negative anisocoria, negative icterus ENT: Negative Runny nose, negative gingival bleeding, Neck:  Negative scars, masses, torticollis, lymphadenopathy, JVD Lungs: decreased breath sounds bilaterally without wheezes or crackles Cardiovascular: Regular rate and rhythm without murmur gallop or rub normal S1 and S2 Abdomen: negative abdominal pain, nondistended, positive soft, bowel sounds, no rebound, no ascites, no appreciable mass Extremities: No significant cyanosis, clubbing, or edema bilateral lower extremities Skin: Negative rashes, lesions, ulcers Psychiatric:  Negative depression, negative anxiety, negative fatigue, negative mania  Central nervous system:  Cranial nerves II through XII intact, tongue/uvula midline, all extremities muscle strength 5/5, sensation intact throughout,negative dysarthria, negative expressive aphasia, negative receptive aphasia.  .     Data Reviewed: Care during the described time interval was provided by me .  I have reviewed this patient's available data, including medical history, events of note, physical examination, and all test results as part of my evaluation.  CBC: Recent Labs  Lab 11/12/19 1807 11/13/19 0152  WBC 4.7 8.1  NEUTROABS 3.8 6.9  HGB 13.6 14.1  HCT 39.2 41.4  MCV 86.2 87.0  PLT 150 295   Basic Metabolic Panel: Recent Labs  Lab 11/12/19 1807 11/13/19 0152  NA 133* 138  K  3.9 4.2  CL 104 102  CO2 16* 17*  GLUCOSE 207* 258*  BUN 43* 46*  CREATININE 1.59* 1.83*  CALCIUM 7.9* 8.5*   GFR: Estimated Creatinine Clearance: 40.6 mL/min (A) (by C-G formula based on SCr of 1.83 mg/dL (H)). Liver Function Tests: Recent Labs  Lab 11/12/19 1807 11/13/19 0152  AST 55* 81*  ALT 32 42  ALKPHOS 50 56  BILITOT 1.3* 1.3*  PROT 6.6 7.5  ALBUMIN 3.3* 3.7   No results for input(s): LIPASE, AMYLASE in the last 168 hours. No results for input(s): AMMONIA in the last 168 hours. Coagulation Profile: No results for input(s): INR, PROTIME in the last 168 hours. Cardiac Enzymes: No results for input(s): CKTOTAL,  CKMB, CKMBINDEX, TROPONINI in the last 168 hours. BNP (last 3 results) No results for input(s): PROBNP in the last 8760 hours. HbA1C: Recent Labs    11/12/19 1807  HGBA1C 7.7*   CBG: Recent Labs  Lab 11/12/19 2206  GLUCAP 215*   Lipid Profile: Recent Labs    11/12/19 1810  TRIG 106   Thyroid Function Tests: No results for input(s): TSH, T4TOTAL, FREET4, T3FREE, THYROIDAB in the last 72 hours. Anemia Panel: Recent Labs    11/12/19 1808  FERRITIN 1,048*   Sepsis Labs: Recent Labs  Lab 11/12/19 1806 11/12/19 1807 11/12/19 2203  PROCALCITON  --  <0.10  --   LATICACIDVEN 1.3  --  3.4*    No results found for this or any previous visit (from the past 240 hour(s)).       Radiology Studies: DG Chest Portable 1 View  Result Date: 11/12/2019 CLINICAL DATA:  COVID hypoxia EXAM: PORTABLE CHEST 1 VIEW COMPARISON:  Chest chest x-ray 01/04/2018 FINDINGS: The heart size and mediastinal contours are unchanged. Aortic arch calcifications. Scattered hazy bilateral lower lung zone airspace opacities. No pulmonary edema. No pleural effusion. No pneumothorax. No acute osseous abnormality. Sternotomy wires and surgical changes overlie the mediastinum. IMPRESSION: Scattered hazy bilateral lower lung zone airspace opacities may represent infection versus  atelectasis. Electronically Signed   By: Iven Finn M.D.   On: 11/12/2019 19:14        Scheduled Meds: . aspirin EC  81 mg Oral Daily  . atorvastatin  80 mg Oral q1800  . baricitinib  2 mg Oral Daily  . enoxaparin (LOVENOX) injection  40 mg Subcutaneous Q24H  . insulin aspart  0-15 Units Subcutaneous TID WC  . insulin aspart  0-5 Units Subcutaneous QHS  . methylPREDNISolone (SOLU-MEDROL) injection  80 mg Intravenous BID   Followed by  . [START ON 11/16/2019] predniSONE  50 mg Oral Daily  . metoprolol succinate  25 mg Oral BID  . multivitamin with minerals  1 tablet Oral Daily  . sodium bicarbonate  650 mg Oral BID   Continuous Infusions: . remdesivir 100 mg in NS 100 mL       LOS: 1 day    Time spent:40 min    Loribeth Katich, Geraldo Docker, MD [facility] Pager [phone]  If 7PM-7AM, please contact night-coverage [email or web address] Password [email or web address] 11/13/2019, 7:50 AM

## 2019-11-14 DIAGNOSIS — R7989 Other specified abnormal findings of blood chemistry: Secondary | ICD-10-CM | POA: Diagnosis not present

## 2019-11-14 DIAGNOSIS — I2510 Atherosclerotic heart disease of native coronary artery without angina pectoris: Secondary | ICD-10-CM | POA: Diagnosis not present

## 2019-11-14 DIAGNOSIS — J9601 Acute respiratory failure with hypoxia: Secondary | ICD-10-CM | POA: Diagnosis not present

## 2019-11-14 DIAGNOSIS — N19 Unspecified kidney failure: Secondary | ICD-10-CM

## 2019-11-14 DIAGNOSIS — U071 COVID-19: Secondary | ICD-10-CM | POA: Diagnosis not present

## 2019-11-14 LAB — LIPID PANEL
Cholesterol: 83 mg/dL (ref 0–200)
HDL: 22 mg/dL — ABNORMAL LOW (ref >40)
LDL Cholesterol: 51 mg/dL (ref 0–99)
Total CHOL/HDL Ratio: 3.8 RATIO
Triglycerides: 51 mg/dL (ref <150)
VLDL: 10 mg/dL (ref 0–40)

## 2019-11-14 LAB — CBC WITH DIFFERENTIAL/PLATELET
Abs Immature Granulocytes: 0.07 10*3/uL (ref 0.00–0.07)
Basophils Absolute: 0 10*3/uL (ref 0.0–0.1)
Basophils Relative: 0 %
Eosinophils Absolute: 0 10*3/uL (ref 0.0–0.5)
Eosinophils Relative: 0 %
HCT: 38.4 % — ABNORMAL LOW (ref 39.0–52.0)
Hemoglobin: 13.1 g/dL (ref 13.0–17.0)
Immature Granulocytes: 1 %
Lymphocytes Relative: 11 %
Lymphs Abs: 0.9 10*3/uL (ref 0.7–4.0)
MCH: 29.6 pg (ref 26.0–34.0)
MCHC: 34.1 g/dL (ref 30.0–36.0)
MCV: 86.7 fL (ref 80.0–100.0)
Monocytes Absolute: 0.3 10*3/uL (ref 0.1–1.0)
Monocytes Relative: 3 %
Neutro Abs: 7 10*3/uL (ref 1.7–7.7)
Neutrophils Relative %: 85 %
Platelets: 190 10*3/uL (ref 150–400)
RBC: 4.43 MIL/uL (ref 4.22–5.81)
RDW: 12.9 % (ref 11.5–15.5)
WBC: 8.3 10*3/uL (ref 4.0–10.5)
nRBC: 0 % (ref 0.0–0.2)

## 2019-11-14 LAB — COMPREHENSIVE METABOLIC PANEL
ALT: 44 U/L (ref 0–44)
AST: 73 U/L — ABNORMAL HIGH (ref 15–41)
Albumin: 3.2 g/dL — ABNORMAL LOW (ref 3.5–5.0)
Alkaline Phosphatase: 51 U/L (ref 38–126)
Anion gap: 11 (ref 5–15)
BUN: 50 mg/dL — ABNORMAL HIGH (ref 8–23)
CO2: 22 mmol/L (ref 22–32)
Calcium: 8.4 mg/dL — ABNORMAL LOW (ref 8.9–10.3)
Chloride: 105 mmol/L (ref 98–111)
Creatinine, Ser: 1.42 mg/dL — ABNORMAL HIGH (ref 0.61–1.24)
GFR calc Af Amer: 58 mL/min — ABNORMAL LOW (ref >60)
GFR calc non Af Amer: 50 mL/min — ABNORMAL LOW (ref >60)
Glucose, Bld: 295 mg/dL — ABNORMAL HIGH (ref 70–99)
Potassium: 3.9 mmol/L (ref 3.5–5.1)
Sodium: 138 mmol/L (ref 135–145)
Total Bilirubin: 0.9 mg/dL (ref 0.3–1.2)
Total Protein: 6.7 g/dL (ref 6.5–8.1)

## 2019-11-14 LAB — C-REACTIVE PROTEIN: CRP: 12 mg/dL — ABNORMAL HIGH (ref <1.0)

## 2019-11-14 LAB — CBG MONITORING, ED
Glucose-Capillary: 312 mg/dL — ABNORMAL HIGH (ref 70–99)
Glucose-Capillary: 270 mg/dL — ABNORMAL HIGH (ref 70–99)
Glucose-Capillary: 283 mg/dL — ABNORMAL HIGH (ref 70–99)
Glucose-Capillary: 340 mg/dL — ABNORMAL HIGH (ref 70–99)

## 2019-11-14 LAB — D-DIMER, QUANTITATIVE: D-Dimer, Quant: 1.2 ug/mL-FEU — ABNORMAL HIGH (ref 0.00–0.50)

## 2019-11-14 LAB — FERRITIN: Ferritin: 1624 ng/mL — ABNORMAL HIGH (ref 24–336)

## 2019-11-14 LAB — HEPATITIS PANEL, ACUTE
HCV Ab: NONREACTIVE
Hep A IgM: NONREACTIVE
Hep B C IgM: NONREACTIVE
Hepatitis B Surface Ag: NONREACTIVE

## 2019-11-14 LAB — PHOSPHORUS: Phosphorus: 2.9 mg/dL (ref 2.5–4.6)

## 2019-11-14 LAB — LACTIC ACID, PLASMA
Lactic Acid, Venous: 3.2 mmol/L (ref 0.5–1.9)
Lactic Acid, Venous: 2 mmol/L (ref 0.5–1.9)

## 2019-11-14 LAB — GLUCOSE, CAPILLARY: Glucose-Capillary: 274 mg/dL — ABNORMAL HIGH (ref 70–99)

## 2019-11-14 LAB — MAGNESIUM: Magnesium: 2.1 mg/dL (ref 1.7–2.4)

## 2019-11-14 LAB — LACTATE DEHYDROGENASE: LDH: 387 U/L — ABNORMAL HIGH (ref 98–192)

## 2019-11-14 MED ORDER — LIP MEDEX EX OINT
1.0000 "application " | TOPICAL_OINTMENT | CUTANEOUS | Status: DC | PRN
  Filled 2019-11-14: qty 7

## 2019-11-14 NOTE — ED Notes (Signed)
CBG- 340 

## 2019-11-14 NOTE — ED Notes (Signed)
Cleaned pt, changed linen, cleaned room.

## 2019-11-14 NOTE — ED Notes (Signed)
Pt up to BSC, HR increased to 160's and O2 dropped to 78%. After getting back in bed pt's HR and O2 returned back to baseline.

## 2019-11-14 NOTE — Progress Notes (Signed)
PROGRESS NOTE    Taylor [name]  HGD:[id number] DOB: 06-Dec-1950 DOA: 11/12/2019 PCP: Haywood Pao, MD     Brief Narrative:  Taylor [name] is a 69 y.o.WM PMHx CAD s/p CABG in 2019, ischemic cardiomyopathy with EF of 40-45%/diastolic CHF grade 2, HTN, nephrolithiasis, DM type II trolled with complication, HLD.  Pt presents to the ED with c/o hypoxia.  Pt has been having nonproductive cough for about a week.  [facility]  Went to [place] infusion clinic earlier today.  Got MAB infusion but during infusion became hypoxic to 86% on RA.  Put on 4L with improvement in sat and sent to ED.  Patient has had continued fevers, myalgias as well as diarrhea without melena or blood per rectum.  No abd pain.  No h/o PE nor DVT.   Subjective: 10/1 hypothermic overnight T minimum 36.2 C,    Assessment & Plan: Covid vaccination;   Principal Problem:   Acute hypoxemic respiratory failure due to COVID-19 [facility]) Active Problems:   Essential hypertension   Hyperlipidemia associated with type 2 diabetes mellitus ([place])   Coronary artery disease involving native coronary artery of native heart without angina pectoris   Ischemic cardiomyopathy - pre-CABG ECHO: 40-45%   Renal insufficiency  Severe sepsis -On admission patient meets guidelines RR> 20, temp> 38 C, site of infection Lung, lactic acid> 2   Acute respiratory failure with hypoxia/Covid pneumonia COVID-19 Labs  Recent Labs    11/12/19 1807 11/12/19 1808 11/13/19 0152 11/13/19 1250 11/14/19 0317  DDIMER 2.01*  --  3.44*  --  1.20*  FERRITIN  --  1,048*  --  1,069* 1,624*  LDH 367*  --   --  416* 387*  CRP  --  10.0* 12.7*  --  12.0*    [facility] -Baricitinib 2 mg daily x14 days per pharmacy protocol -Solu-Medrol 80 mg BID -Remdesivir per pharmacy protocol -Vitamin C and zinc per Covid protocol -Respimat QID -Titrate O2 to maintain SPO2> 88% -Prone  patient 8 hours/day; if patient cannot tolerate prone 2 to 3 hours per shift  Ischemic cardiomyopathy/CAD -Strict ins and out +443ml -Daily weight Filed Weights   11/12/19 2202 11/14/19 1303  Weight: 87.1 kg 90.7 kg  -Toprol 25 mg BID  HTN -See cardiomyopathy  HLD -Lipitor 80 mg daily -Lipid panel pending  DM type II uncontrolled with complication -9/62 hemoglobin A1c= 7.7 -Levemir 10 units daily -NovoLog 5 units qac -Moderate SSI  CKD stage IIIa( Baseline Cr 1.26 on 01/14/2019) Lab Results  Component Value Date   CREATININE 1.42 (H) 11/14/2019   CREATININE 1.83 (H) 11/13/2019   CREATININE 1.59 (H) 11/12/2019   CREATININE 1.26 01/14/2019   CREATININE 2.46 (H) 07/31/2018  -Initially came in with mild normal anion gap acidosis; ACEI held.  Lactate level normal -Strict in and out -Daily weight -Continue sodium bicarb 650 mg BID -Improving we will hold on renal ultrasound for now.  Uremia vs dementia -The elevated therefore believe patient may have a small touch of dementia unknown baseline.      DVT prophylaxis: Lovenox Code Status: Full Family Communication:  Status is: Inpatient    Dispo: The patient is from: Home              Anticipated d/c is to: Home              Anticipated d/c date is: 10/4  Patient currently unstable      Consultants:    Procedures/Significant Events:    I have personally reviewed and interpreted all radiology studies and my findings are as above.  VENTILATOR SETTINGS: Nasal cannula 10/1 Flow; 12 L/min SPO2; 85%   Cultures   Antimicrobials: Anti-infectives (From admission, onward)   Start     Ordered Stop   11/13/19 1000  remdesivir 100 mg in sodium chloride 0.9 % 100 mL IVPB       "Followed by" Linked Group Details   11/12/19 2035 11/17/19 0959   11/12/19 2100  remdesivir 200 mg in sodium chloride 0.9% 250 mL IVPB       "Followed by" Linked Group Details   11/12/19 2035 11/12/19 2239        Devices    LINES / TUBES:      Continuous Infusions: . remdesivir 100 mg in NS 100 mL 100 mg (11/14/19 1010)     Objective: Vitals:   11/14/19 0820 11/14/19 0900 11/14/19 0910 11/14/19 1000  BP:  (!) 152/87 (!) 152/87 (!) 150/128  Pulse: 71 82 78 84  Resp: (!) 32  18   Temp:      TempSrc:      SpO2: 96% 91% 92%   Weight:       No intake or output data in the 24 hours ending 11/14/19 1050 Filed Weights   11/12/19 2202  Weight: 87.1 kg    Examination:  General: A/O x4, No acute respiratory distress Eyes: negative scleral hemorrhage, negative anisocoria, negative icterus ENT: Negative Runny nose, negative gingival bleeding, Neck:  Negative scars, masses, torticollis, lymphadenopathy, JVD Lungs: decreased breath sounds bilaterally without wheezes or crackles Cardiovascular: Regular rate and rhythm without murmur gallop or rub normal S1 and S2 Abdomen: negative abdominal pain, nondistended, positive soft, bowel sounds, no rebound, no ascites, no appreciable mass Extremities: No significant cyanosis, clubbing, or edema bilateral lower extremities Skin: Negative rashes, lesions, ulcers Psychiatric:  Negative depression, negative anxiety, negative fatigue, negative mania  Central nervous system:  Cranial nerves II through XII intact, tongue/uvula midline, all extremities muscle strength 5/5, sensation intact throughout,negative dysarthria, negative expressive aphasia, negative receptive aphasia.  .     Data Reviewed: Care during the described time interval was provided by me .  I have reviewed this patient's available data, including medical history, events of note, physical examination, and all test results as part of my evaluation.  CBC: Recent Labs  Lab 11/12/19 1807 11/13/19 0152 11/14/19 0317  WBC 4.7 8.1 8.3  NEUTROABS 3.8 6.9 7.0  HGB 13.6 14.1 13.1  HCT 39.2 41.4 38.4*  MCV 86.2 87.0 86.7  PLT 150 164 932   Basic Metabolic Panel: Recent Labs   Lab 11/12/19 1807 11/13/19 0152 11/13/19 1250 11/14/19 0317  NA 133* 138  --  138  K 3.9 4.2  --  3.9  CL 104 102  --  105  CO2 16* 17*  --  22  GLUCOSE 207* 258*  --  295*  BUN 43* 46*  --  50*  CREATININE 1.59* 1.83*  --  1.42*  CALCIUM 7.9* 8.5*  --  8.4*  MG  --   --  2.0 2.1  PHOS  --   --  3.8 2.9   GFR: Estimated Creatinine Clearance: 52.3 mL/min (A) (by C-G formula based on SCr of 1.42 mg/dL (H)). Liver Function Tests: Recent Labs  Lab 11/12/19 1807 11/13/19 0152 11/14/19 0317  AST 55* 81* 73*  ALT  32 42 44  ALKPHOS 50 56 51  BILITOT 1.3* 1.3* 0.9  PROT 6.6 7.5 6.7  ALBUMIN 3.3* 3.7 3.2*   No results for input(s): LIPASE, AMYLASE in the last 168 hours. No results for input(s): AMMONIA in the last 168 hours. Coagulation Profile: No results for input(s): INR, PROTIME in the last 168 hours. Cardiac Enzymes: No results for input(s): CKTOTAL, CKMB, CKMBINDEX, TROPONINI in the last 168 hours. BNP (last 3 results) No results for input(s): PROBNP in the last 8760 hours. HbA1C: Recent Labs    11/12/19 1807  HGBA1C 7.7*   CBG: Recent Labs  Lab 11/12/19 2206 11/13/19 0902 11/13/19 1247  GLUCAP 215* 286* 374*   Lipid Profile: Recent Labs    11/12/19 1810 11/14/19 0317  CHOL  --  83  HDL  --  22*  LDLCALC  --  51  TRIG 106 51  CHOLHDL  --  3.8   Thyroid Function Tests: No results for input(s): TSH, T4TOTAL, FREET4, T3FREE, THYROIDAB in the last 72 hours. Anemia Panel: Recent Labs    11/13/19 1250 11/14/19 0317  FERRITIN 1,069* 1,624*   Sepsis Labs: Recent Labs  Lab 11/12/19 1806 11/12/19 1807 11/12/19 2203  PROCALCITON  --  <0.10  --   LATICACIDVEN 1.3  --  3.4*    Recent Results (from the past 240 hour(s))  Blood Culture (routine x 2)     Status: None (Preliminary result)   Collection Time: 11/12/19  6:07 PM   Specimen: BLOOD  Result Value Ref Range Status   Specimen Description   Final    BLOOD RIGHT ARM Performed at [facility] [address], [place], [place] [address]    Special Requests   Final    BOTTLES DRAWN AEROBIC AND ANAEROBIC Blood Culture adequate volume Performed at [facility] [address] [place], [place] [address]    Culture   Final    NO GROWTH 2 DAYS Performed at [facility] [address] [place], [place] [address]    Report Status PENDING  Incomplete  Blood Culture (routine x 2)     Status: None (Preliminary result)   Collection Time: 11/12/19  6:17 PM   Specimen: BLOOD  Result Value Ref Range Status   Specimen Description   Final    BLOOD RIGHT ANTECUBITAL Performed at [facility] [address], [place], [place] [address]    Special Requests   Final    BOTTLES DRAWN AEROBIC AND ANAEROBIC Blood Culture results may not be optimal due to an inadequate volume of blood received in culture bottles Performed at [facility] [address] [place], [place] [address]    Culture   Final    NO GROWTH 2 DAYS Performed at [facility] [address], [place],  [address]    Report Status PENDING  Incomplete         Radiology Studies: DG Chest Portable 1 View  Result Date: 11/12/2019 CLINICAL DATA:  COVID hypoxia EXAM: PORTABLE CHEST 1 VIEW COMPARISON:  Chest chest x-ray 01/04/2018 FINDINGS: The heart size and mediastinal contours are unchanged. Aortic arch calcifications. Scattered hazy bilateral lower lung zone airspace opacities. No pulmonary edema. No pleural effusion. No pneumothorax. No acute osseous abnormality. Sternotomy wires and surgical changes overlie the mediastinum. IMPRESSION: Scattered hazy bilateral lower lung zone airspace opacities may represent infection versus atelectasis. Electronically Signed   By: Iven Finn M.D.   On:  11/12/2019 19:14        Scheduled Meds: . vitamin C  500 mg Oral Daily  . aspirin EC  81 mg Oral Daily  . atorvastatin  80 mg Oral  q1800  . baricitinib  2 mg Oral Daily  . enoxaparin (LOVENOX) injection  40 mg Subcutaneous Q24H  . insulin aspart  0-15 Units Subcutaneous TID WC  . insulin aspart  0-5 Units Subcutaneous QHS  . insulin aspart  5 Units Subcutaneous TID WC  . insulin detemir  10 Units Subcutaneous Daily  . Ipratropium-Albuterol  1 puff Inhalation Q6H  . methylPREDNISolone (SOLU-MEDROL) injection  80 mg Intravenous BID   Followed by  . [START ON 11/16/2019] predniSONE  50 mg Oral Daily  . metoprolol succinate  25 mg Oral BID  . multivitamin with minerals  1 tablet Oral Daily  . sodium bicarbonate  650 mg Oral BID  . zinc sulfate  220 mg Oral Daily   Continuous Infusions: . remdesivir 100 mg in NS 100 mL 100 mg (11/14/19 1010)     LOS: 2 days    Time spent:40 min    Madelena Maturin, Geraldo Docker, MD [facility] Pager [phone]  If 7PM-7AM, please contact night-coverage [email or web address] Password [email or web address] 11/14/2019, 10:50 AM

## 2019-11-14 NOTE — ED Notes (Signed)
Per family, states patient is a diabetic and they were inquiring about bringing him extra food-states he is not getting enough meal coverage for insulin coverage-informed family outside food is not allowed at this time and Probation officer would send message to hospitalist requesting additional food

## 2019-11-14 NOTE — Progress Notes (Signed)
Pt arrived to 5W unit via stretcher. Patient oriented to unit and assigned RN/CNA. Pt placed on 12L HFNC. 85% (patient just ambulated).  VSS, mildly hypertensive.Tele box 64.  No signs of distress this time. NO complaints of pain.

## 2019-11-15 DIAGNOSIS — I2510 Atherosclerotic heart disease of native coronary artery without angina pectoris: Secondary | ICD-10-CM | POA: Diagnosis not present

## 2019-11-15 DIAGNOSIS — J9601 Acute respiratory failure with hypoxia: Secondary | ICD-10-CM | POA: Diagnosis not present

## 2019-11-15 DIAGNOSIS — U071 COVID-19: Secondary | ICD-10-CM | POA: Diagnosis not present

## 2019-11-15 DIAGNOSIS — R7989 Other specified abnormal findings of blood chemistry: Secondary | ICD-10-CM | POA: Diagnosis not present

## 2019-11-15 LAB — CBC WITH DIFFERENTIAL/PLATELET
Abs Immature Granulocytes: 0.07 10*3/uL (ref 0.00–0.07)
Basophils Absolute: 0 10*3/uL (ref 0.0–0.1)
Basophils Relative: 0 %
Eosinophils Absolute: 0 10*3/uL (ref 0.0–0.5)
Eosinophils Relative: 0 %
HCT: 37.5 % — ABNORMAL LOW (ref 39.0–52.0)
Hemoglobin: 12.6 g/dL — ABNORMAL LOW (ref 13.0–17.0)
Immature Granulocytes: 1 %
Lymphocytes Relative: 6 %
Lymphs Abs: 0.7 10*3/uL (ref 0.7–4.0)
MCH: 29.4 pg (ref 26.0–34.0)
MCHC: 33.6 g/dL (ref 30.0–36.0)
MCV: 87.4 fL (ref 80.0–100.0)
Monocytes Absolute: 0.3 10*3/uL (ref 0.1–1.0)
Monocytes Relative: 3 %
Neutro Abs: 9.5 10*3/uL — ABNORMAL HIGH (ref 1.7–7.7)
Neutrophils Relative %: 90 %
Platelets: 229 10*3/uL (ref 150–400)
RBC: 4.29 MIL/uL (ref 4.22–5.81)
RDW: 12.9 % (ref 11.5–15.5)
WBC: 10.6 10*3/uL — ABNORMAL HIGH (ref 4.0–10.5)
nRBC: 0 % (ref 0.0–0.2)

## 2019-11-15 LAB — COMPREHENSIVE METABOLIC PANEL
ALT: 47 U/L — ABNORMAL HIGH (ref 0–44)
AST: 60 U/L — ABNORMAL HIGH (ref 15–41)
Albumin: 3.2 g/dL — ABNORMAL LOW (ref 3.5–5.0)
Alkaline Phosphatase: 51 U/L (ref 38–126)
Anion gap: 11 (ref 5–15)
BUN: 58 mg/dL — ABNORMAL HIGH (ref 8–23)
CO2: 21 mmol/L — ABNORMAL LOW (ref 22–32)
Calcium: 8.4 mg/dL — ABNORMAL LOW (ref 8.9–10.3)
Chloride: 106 mmol/L (ref 98–111)
Creatinine, Ser: 1.35 mg/dL — ABNORMAL HIGH (ref 0.61–1.24)
GFR calc Af Amer: >60 mL/min (ref >60)
GFR calc non Af Amer: 53 mL/min — ABNORMAL LOW (ref >60)
Glucose, Bld: 424 mg/dL — ABNORMAL HIGH (ref 70–99)
Potassium: 3.7 mmol/L (ref 3.5–5.1)
Sodium: 138 mmol/L (ref 135–145)
Total Bilirubin: 1.3 mg/dL — ABNORMAL HIGH (ref 0.3–1.2)
Total Protein: 6.3 g/dL — ABNORMAL LOW (ref 6.5–8.1)

## 2019-11-15 LAB — LACTATE DEHYDROGENASE: LDH: 341 U/L — ABNORMAL HIGH (ref 98–192)

## 2019-11-15 LAB — FERRITIN: Ferritin: 1219 ng/mL — ABNORMAL HIGH (ref 24–336)

## 2019-11-15 LAB — C-REACTIVE PROTEIN: CRP: 4.4 mg/dL — ABNORMAL HIGH (ref <1.0)

## 2019-11-15 LAB — GLUCOSE, CAPILLARY
Glucose-Capillary: 301 mg/dL — ABNORMAL HIGH (ref 70–99)
Glucose-Capillary: 359 mg/dL — ABNORMAL HIGH (ref 70–99)
Glucose-Capillary: 373 mg/dL — ABNORMAL HIGH (ref 70–99)
Glucose-Capillary: 210 mg/dL — ABNORMAL HIGH (ref 70–99)

## 2019-11-15 LAB — PHOSPHORUS: Phosphorus: 3.6 mg/dL (ref 2.5–4.6)

## 2019-11-15 LAB — MAGNESIUM: Magnesium: 2.3 mg/dL (ref 1.7–2.4)

## 2019-11-15 LAB — D-DIMER, QUANTITATIVE: D-Dimer, Quant: 0.75 ug/mL-FEU — ABNORMAL HIGH (ref 0.00–0.50)

## 2019-11-15 MED ORDER — INSULIN DETEMIR 100 UNIT/ML ~~LOC~~ SOLN
15.0000 [IU] | Freq: Every day | SUBCUTANEOUS | Status: DC
  Administered 2019-11-16: 15 [IU] via SUBCUTANEOUS
  Filled 2019-11-15: qty 0.15

## 2019-11-15 MED ORDER — INSULIN ASPART 100 UNIT/ML ~~LOC~~ SOLN
10.0000 [IU] | Freq: Three times a day (TID) | SUBCUTANEOUS | Status: DC
  Administered 2019-11-15 – 2019-11-16 (×3): 10 [IU] via SUBCUTANEOUS

## 2019-11-15 MED ORDER — FUROSEMIDE 10 MG/ML IJ SOLN
60.0000 mg | Freq: Once | INTRAMUSCULAR | Status: DC
  Filled 2019-11-15: qty 6

## 2019-11-15 NOTE — Progress Notes (Signed)
PROGRESS NOTE    Taylor [name]  EYC:[id number] DOB: 1950-03-27 DOA: 11/12/2019 PCP: Haywood Pao, MD     Brief Narrative:  Taylor [name] is a 69 y.o.WM PMHx CAD s/p CABG in 2019, ischemic cardiomyopathy with EF of 40-45%/diastolic CHF grade 2, HTN, nephrolithiasis, DM type II trolled with complication, HLD.  Pt presents to the ED with c/o hypoxia.  Pt has been having nonproductive cough for about a week.  [facility]  Went to [place] infusion clinic earlier today.  Got MAB infusion but during infusion became hypoxic to 86% on RA.  Put on 4L with improvement in sat and sent to ED.  Patient has had continued fevers, myalgias as well as diarrhea without melena or blood per rectum.  No abd pain.  No h/o PE nor DVT.   Subjective: 10/2 afebrile overnight A/O x 4 positive S OB, negative abdominal pain, negative nausea, negative vomiting, negative diarrhea.   Assessment & Plan: Covid vaccination;   Principal Problem:   Acute hypoxemic respiratory failure due to COVID-19 [facility]) Active Problems:   Essential hypertension   Hyperlipidemia associated with type 2 diabetes mellitus ([place])   Coronary artery disease involving native coronary artery of native heart without angina pectoris   Ischemic cardiomyopathy - pre-CABG ECHO: 40-45%   Renal insufficiency  Severe sepsis -On admission patient meets guidelines RR> 20, temp> 38 C, site of infection Lung, lactic acid> 2   Acute respiratory failure with hypoxia/Covid pneumonia COVID-19 Labs  Recent Labs    11/12/19 1807 11/12/19 1808 11/13/19 0152 11/13/19 1250 11/14/19 0317  DDIMER 2.01*  --  3.44*  --  1.20*  FERRITIN  --  1,048*  --  1,069* 1,624*  LDH 367*  --   --  416* 387*  CRP  --  10.0* 12.7*  --  12.0*    [facility] -Baricitinib 2 mg daily x14 days per pharmacy protocol -Solu-Medrol 80 mg BID -Remdesivir per pharmacy protocol -Vitamin C and  zinc per Covid protocol -Respimat QID -Titrate O2 to maintain SPO2> 88% -Prone patient 8 hours/day; if patient cannot tolerate prone 2 to 3 hours per shift  Ischemic cardiomyopathy/CAD -Strict ins and out -14ml -Daily weight Filed Weights   11/12/19 2202 11/14/19 1303 11/15/19 0500  Weight: 87.1 kg 90.7 kg 90.1 kg  -Toprol 25 mg BID -Lasix IV 60 mg x 1  HTN -See cardiomyopathy  HLD -Lipitor 80 mg daily -Lipid panel pending  DM type II uncontrolled with complication -1/49 hemoglobin A1c= 7.7 -10/2 increase Levemir 15 units daily -10/2 increase NovoLog 10 units qac -Moderate SSI  CKD stage IIIa( Baseline Cr 1.26 on 01/14/2019) Lab Results  Component Value Date   CREATININE 1.35 (H) 11/15/2019   CREATININE 1.42 (H) 11/14/2019   CREATININE 1.83 (H) 11/13/2019   CREATININE 1.59 (H) 11/12/2019   CREATININE 1.26 01/14/2019   -Initially came in with mild normal anion gap acidosis; ACEI held.  Lactate level normal -Continue sodium bicarb 650 mg BID -Improving we will hold on renal ultrasound for now.  Uremia vs dementia -The elevated therefore believe patient may have a small touch of dementia unknown baseline.      DVT prophylaxis: Lovenox Code Status: Full Family Communication:  Status is: Inpatient    Dispo: The patient is from: Home              Anticipated d/c is to: Home  Anticipated d/c date is: 10/4              Patient currently unstable      Consultants:    Procedures/Significant Events:    I have personally reviewed and interpreted all radiology studies and my findings are as above.  VENTILATOR SETTINGS: Nasal cannula 10/2 Flow; 12 L/min SPO2; 90%   Cultures   Antimicrobials: Anti-infectives (From admission, onward)   Start     Ordered Stop   11/13/19 1000  remdesivir 100 mg in sodium chloride 0.9 % 100 mL IVPB       "Followed by" Linked Group Details   11/12/19 2035 11/17/19 0959   11/12/19 2100  remdesivir 200 mg in  sodium chloride 0.9% 250 mL IVPB       "Followed by" Linked Group Details   11/12/19 2035 11/12/19 2239       Devices    LINES / TUBES:      Continuous Infusions: . remdesivir 100 mg in NS 100 mL Stopped (11/14/19 1040)     Objective: Vitals:   11/15/19 0200 11/15/19 0248 11/15/19 0500 11/15/19 0633  BP:  118/69  132/70  Pulse:  70  75  Resp:  (!) 25  (!) 24  Temp:  (!) 97.5 F (36.4 C)  98.1 F (36.7 C)  TempSrc:      SpO2: 92% (!) 87% 90% (!) 86%  Weight:   90.1 kg   Height:        Intake/Output Summary (Last 24 hours) at 11/15/2019 9628 Last data filed at 11/15/2019 3662 Gross per 24 hour  Intake 339 ml  Output 600 ml  Net -261 ml   Filed Weights   11/12/19 2202 11/14/19 1303 11/15/19 0500  Weight: 87.1 kg 90.7 kg 90.1 kg   Physical Exam:  General: A/o X 4 positive  acute respiratory distress Eyes: negative scleral hemorrhage, negative anisocoria, negative icterus ENT: Negative Runny nose, negative gingival bleeding, Neck:  Negative scars, masses, torticollis, lymphadenopathy, JVD Lungs: decreased breath sounds bilaterally without wheezes, positive crackles Cardiovascular: Regular rate and rhythm without murmur gallop or rub normal S1 and S2 Abdomen: negative abdominal pain, nondistended, positive soft, bowel sounds, no rebound, no ascites, no appreciable mass Extremities: No significant cyanosis, clubbing, or edema bilateral lower extremities Skin: Negative rashes, lesions, ulcers Psychiatric:  Negative depression, negative anxiety, negative fatigue, negative mania  Central nervous system:  Cranial nerves II through XII intact, tongue/uvula midline, all extremities muscle strength 5/5, sensation intact throughout, negative dysarthria, negative expressive aphasia, negative receptive aphasia.  .     Data Reviewed: Care during the described time interval was provided by me .  I have reviewed this patient's available data, including medical history,  events of note, physical examination, and all test results as part of my evaluation.  CBC: Recent Labs  Lab 11/12/19 1807 11/13/19 0152 11/14/19 0317 11/15/19 0553  WBC 4.7 8.1 8.3 10.6*  NEUTROABS 3.8 6.9 7.0 9.5*  HGB 13.6 14.1 13.1 12.6*  HCT 39.2 41.4 38.4* 37.5*  MCV 86.2 87.0 86.7 87.4  PLT 150 164 190 947   Basic Metabolic Panel: Recent Labs  Lab 11/12/19 1807 11/13/19 0152 11/13/19 1250 11/14/19 0317  NA 133* 138  --  138  K 3.9 4.2  --  3.9  CL 104 102  --  105  CO2 16* 17*  --  22  GLUCOSE 207* 258*  --  295*  BUN 43* 46*  --  50*  CREATININE 1.59* 1.83*  --  1.42*  CALCIUM 7.9* 8.5*  --  8.4*  MG  --   --  2.0 2.1  PHOS  --   --  3.8 2.9   GFR: Estimated Creatinine Clearance: 52.3 mL/min (A) (by C-G formula based on SCr of 1.42 mg/dL (H)). Liver Function Tests: Recent Labs  Lab 11/12/19 1807 11/13/19 0152 11/14/19 0317  AST 55* 81* 73*  ALT 32 42 44  ALKPHOS 50 56 51  BILITOT 1.3* 1.3* 0.9  PROT 6.6 7.5 6.7  ALBUMIN 3.3* 3.7 3.2*   No results for input(s): LIPASE, AMYLASE in the last 168 hours. No results for input(s): AMMONIA in the last 168 hours. Coagulation Profile: No results for input(s): INR, PROTIME in the last 168 hours. Cardiac Enzymes: No results for input(s): CKTOTAL, CKMB, CKMBINDEX, TROPONINI in the last 168 hours. BNP (last 3 results) No results for input(s): PROBNP in the last 8760 hours. HbA1C: Recent Labs    11/12/19 1807  HGBA1C 7.7*   CBG: Recent Labs  Lab 11/13/19 2239 11/14/19 0846 11/14/19 1318 11/14/19 2139 11/15/19 0738  GLUCAP 270* 283* 340* 274* 301*   Lipid Profile: Recent Labs    11/12/19 1810 11/14/19 0317  CHOL  --  83  HDL  --  22*  LDLCALC  --  51  TRIG 106 51  CHOLHDL  --  3.8   Thyroid Function Tests: No results for input(s): TSH, T4TOTAL, FREET4, T3FREE, THYROIDAB in the last 72 hours. Anemia Panel: Recent Labs    11/13/19 1250 11/14/19 0317  FERRITIN 1,069* 1,624*   Sepsis  Labs: Recent Labs  Lab 11/12/19 1806 11/12/19 1807 11/12/19 2203 11/14/19 1108 11/14/19 1340  PROCALCITON  --  <0.10  --   --   --   LATICACIDVEN 1.3  --  3.4* 3.2* 2.0*    Recent Results (from the past 240 hour(s))  Blood Culture (routine x 2)     Status: None (Preliminary result)   Collection Time: 11/12/19  6:07 PM   Specimen: BLOOD  Result Value Ref Range Status   Specimen Description   Final    BLOOD RIGHT ARM Performed at [facility], [place] [address] [place], [place] [address]    Special Requests   Final    BOTTLES DRAWN AEROBIC AND ANAEROBIC Blood Culture adequate volume Performed at [facility] [address] [place], [place] [address]    Culture   Final    NO GROWTH 3 DAYS Performed at [facility], [place] [address] [place], [place] [address]    Report Status PENDING  Incomplete  Blood Culture (routine x 2)     Status: None (Preliminary result)   Collection Time: 11/12/19  6:17 PM   Specimen: BLOOD  Result Value Ref Range Status   Specimen Description   Final    BLOOD RIGHT ANTECUBITAL Performed at [facility] [address], [place], [place] [address]    Special Requests   Final    BOTTLES DRAWN AEROBIC AND ANAEROBIC Blood Culture results may not be optimal due to an inadequate volume of blood received in culture bottles Performed at [facility] [address], [place], [place] [address]    Culture   Final    NO GROWTH 3 DAYS Performed at [facility], [place] [address], [place], [place] [address]    Report Status PENDING  Incomplete         Radiology Studies: No results found.  Scheduled Meds: . vitamin C  500 mg Oral Daily  . aspirin EC  81 mg Oral Daily  . atorvastatin  80 mg Oral q1800  . baricitinib  2 mg Oral Daily  . enoxaparin (LOVENOX) injection  40 mg Subcutaneous Q24H  . insulin aspart  0-15 Units Subcutaneous TID WC  . insulin aspart   0-5 Units Subcutaneous QHS  . insulin aspart  5 Units Subcutaneous TID WC  . insulin detemir  10 Units Subcutaneous Daily  . Ipratropium-Albuterol  1 puff Inhalation Q6H  . methylPREDNISolone (SOLU-MEDROL) injection  80 mg Intravenous BID   Followed by  . [START ON 11/16/2019] predniSONE  50 mg Oral Daily  . metoprolol succinate  25 mg Oral BID  . multivitamin with minerals  1 tablet Oral Daily  . sodium bicarbonate  650 mg Oral BID  . zinc sulfate  220 mg Oral Daily   Continuous Infusions: . remdesivir 100 mg in NS 100 mL Stopped (11/14/19 1040)     LOS: 3 days    Time spent:40 min    Daionna Crossland, Geraldo Docker, MD [facility] Pager [phone]  If 7PM-7AM, please contact night-coverage [email or web address] Password [email or web address] 11/15/2019, 9:03 AM

## 2019-11-16 DIAGNOSIS — I2510 Atherosclerotic heart disease of native coronary artery without angina pectoris: Secondary | ICD-10-CM | POA: Diagnosis not present

## 2019-11-16 DIAGNOSIS — R7989 Other specified abnormal findings of blood chemistry: Secondary | ICD-10-CM | POA: Diagnosis not present

## 2019-11-16 DIAGNOSIS — J9601 Acute respiratory failure with hypoxia: Secondary | ICD-10-CM | POA: Diagnosis not present

## 2019-11-16 DIAGNOSIS — U071 COVID-19: Secondary | ICD-10-CM | POA: Diagnosis not present

## 2019-11-16 LAB — COMPREHENSIVE METABOLIC PANEL
ALT: 44 U/L (ref 0–44)
AST: 48 U/L — ABNORMAL HIGH (ref 15–41)
Albumin: 3.1 g/dL — ABNORMAL LOW (ref 3.5–5.0)
Alkaline Phosphatase: 49 U/L (ref 38–126)
Anion gap: 11 (ref 5–15)
BUN: 58 mg/dL — ABNORMAL HIGH (ref 8–23)
CO2: 22 mmol/L (ref 22–32)
Calcium: 8.5 mg/dL — ABNORMAL LOW (ref 8.9–10.3)
Chloride: 108 mmol/L (ref 98–111)
Creatinine, Ser: 1.32 mg/dL — ABNORMAL HIGH (ref 0.61–1.24)
GFR calc Af Amer: >60 mL/min (ref >60)
GFR calc non Af Amer: 55 mL/min — ABNORMAL LOW (ref >60)
Glucose, Bld: 306 mg/dL — ABNORMAL HIGH (ref 70–99)
Potassium: 3.9 mmol/L (ref 3.5–5.1)
Sodium: 141 mmol/L (ref 135–145)
Total Bilirubin: 1.4 mg/dL — ABNORMAL HIGH (ref 0.3–1.2)
Total Protein: 6.1 g/dL — ABNORMAL LOW (ref 6.5–8.1)

## 2019-11-16 LAB — CBC WITH DIFFERENTIAL/PLATELET
Abs Immature Granulocytes: 0.07 10*3/uL (ref 0.00–0.07)
Basophils Absolute: 0 10*3/uL (ref 0.0–0.1)
Basophils Relative: 0 %
Eosinophils Absolute: 0 10*3/uL (ref 0.0–0.5)
Eosinophils Relative: 0 %
HCT: 35.6 % — ABNORMAL LOW (ref 39.0–52.0)
Hemoglobin: 12.1 g/dL — ABNORMAL LOW (ref 13.0–17.0)
Immature Granulocytes: 1 %
Lymphocytes Relative: 5 %
Lymphs Abs: 0.4 10*3/uL — ABNORMAL LOW (ref 0.7–4.0)
MCH: 29.6 pg (ref 26.0–34.0)
MCHC: 34 g/dL (ref 30.0–36.0)
MCV: 87 fL (ref 80.0–100.0)
Monocytes Absolute: 0.4 10*3/uL (ref 0.1–1.0)
Monocytes Relative: 5 %
Neutro Abs: 7.7 10*3/uL (ref 1.7–7.7)
Neutrophils Relative %: 89 %
Platelets: 227 10*3/uL (ref 150–400)
RBC: 4.09 MIL/uL — ABNORMAL LOW (ref 4.22–5.81)
RDW: 13 % (ref 11.5–15.5)
WBC: 8.6 10*3/uL (ref 4.0–10.5)
nRBC: 0 % (ref 0.0–0.2)

## 2019-11-16 LAB — C-REACTIVE PROTEIN: CRP: 2.2 mg/dL — ABNORMAL HIGH (ref <1.0)

## 2019-11-16 LAB — GLUCOSE, CAPILLARY
Glucose-Capillary: 353 mg/dL — ABNORMAL HIGH (ref 70–99)
Glucose-Capillary: 278 mg/dL — ABNORMAL HIGH (ref 70–99)
Glucose-Capillary: 187 mg/dL — ABNORMAL HIGH (ref 70–99)

## 2019-11-16 LAB — LACTATE DEHYDROGENASE: LDH: 318 U/L — ABNORMAL HIGH (ref 98–192)

## 2019-11-16 LAB — PHOSPHORUS: Phosphorus: 3.5 mg/dL (ref 2.5–4.6)

## 2019-11-16 LAB — MAGNESIUM: Magnesium: 2.3 mg/dL (ref 1.7–2.4)

## 2019-11-16 LAB — FERRITIN: Ferritin: 929 ng/mL — ABNORMAL HIGH (ref 24–336)

## 2019-11-16 LAB — D-DIMER, QUANTITATIVE: D-Dimer, Quant: 0.77 ug/mL-FEU — ABNORMAL HIGH (ref 0.00–0.50)

## 2019-11-16 MED ORDER — INSULIN DETEMIR 100 UNIT/ML ~~LOC~~ SOLN
10.0000 [IU] | Freq: Two times a day (BID) | SUBCUTANEOUS | Status: DC
  Administered 2019-11-16 – 2019-11-21 (×9): 10 [IU] via SUBCUTANEOUS
  Filled 2019-11-16 (×11): qty 0.1

## 2019-11-16 MED ORDER — INSULIN ASPART 100 UNIT/ML ~~LOC~~ SOLN
14.0000 [IU] | Freq: Three times a day (TID) | SUBCUTANEOUS | Status: DC
  Administered 2019-11-17 – 2019-11-18 (×5): 14 [IU] via SUBCUTANEOUS

## 2019-11-16 MED ORDER — FUROSEMIDE 10 MG/ML IJ SOLN
40.0000 mg | Freq: Once | INTRAMUSCULAR | Status: AC
  Administered 2019-11-16: 40 mg via INTRAVENOUS

## 2019-11-16 NOTE — Progress Notes (Signed)
PROGRESS NOTE    Taylor [name]  WYO:[id number] DOB: [birth year] DOA: 11/12/2019 PCP: Haywood Pao, MD     Brief Narrative:  Taylor [name] is a 69 y.o.WM PMHx CAD s/p CABG in 2019, ischemic cardiomyopathy with EF of 40-45%/diastolic CHF grade 2, HTN, nephrolithiasis, DM type II trolled with complication, HLD.  Pt presents to the ED with c/o hypoxia.  Pt has been having nonproductive cough for about a week.  [facility]  Went to [facility] infusion clinic earlier today.  Got MAB infusion but during infusion became hypoxic to 86% on RA.  Put on 4L with improvement in sat and sent to ED.  Patient has had continued fevers, myalgias as well as diarrhea without melena or blood per rectum.  No abd pain.  No h/o PE nor DVT.   Subjective: 10/3 afebrile overnight A/O x4, positive S OB.  Negative abdominal pain, negative nausea, negative vomiting, negative diarrhea   Assessment & Plan: Covid vaccination;   Principal Problem:   Acute hypoxemic respiratory failure due to COVID-19 [facility]) Active Problems:   Essential hypertension   Hyperlipidemia associated with type 2 diabetes mellitus ([place])   Coronary artery disease involving native coronary artery of native heart without angina pectoris   Ischemic cardiomyopathy - pre-CABG ECHO: 40-45%   Renal insufficiency  Severe sepsis -On admission patient meets guidelines RR> 20, temp> 38 C, site of infection Lung, lactic acid> 2   Acute respiratory failure with hypoxia/Covid pneumonia COVID-19 Labs  Recent Labs    11/14/19 0317 11/15/19 0553 11/15/19 0939 11/16/19 0531  DDIMER 1.20* 0.75*  --  0.77*  FERRITIN 1,624* 1,219*  --  929*  LDH 387*  --  341* 318*  CRP 12.0* 4.4*  --  2.2*    [facility] -Baricitinib 2 mg daily x14 days per pharmacy protocol -Solu-Medrol 80 mg BID -Remdesivir per pharmacy protocol -Vitamin C and zinc per Covid protocol -Respimat  QID -Titrate O2 to maintain SPO2> 88% -Prone patient 8 hours/day; if patient cannot tolerate prone 2 to 3 hours per shift  Ischemic cardiomyopathy/CAD -Strict ins and out -1.2 L -Daily weight Filed Weights   11/12/19 2202 11/14/19 1303 11/15/19 0500  Weight: 87.1 kg 90.7 kg 90.1 kg  -Toprol 25 mg BID -10/2 Lasix IV 60 mg x 1 -10/3 Lasix IV 40 mg x 1  HTN -See cardiomyopathy  HLD -Lipitor 80 mg daily -10/1 LDL = 51   DM type II uncontrolled with complication -7/74 hemoglobin A1c= 7.7 -10/3 increase Levemir 10 units BID -10/3 increase NovoLog 14 units qac  -Moderate SSI  CKD stage IIIa( Baseline Cr 1.26 on 01/14/2019) Lab Results  Component Value Date   CREATININE 1.32 (H) 11/16/2019   CREATININE 1.35 (H) 11/15/2019   CREATININE 1.42 (H) 11/14/2019   CREATININE 1.83 (H) 11/13/2019   CREATININE 1.59 (H) 11/12/2019   -Initially came in with mild normal anion gap acidosis; ACEI held.  Lactate level normal -Continue sodium bicarb 650 mg BID -Improving we will hold on renal ultrasound for now.  Uremia vs dementia -The elevated therefore believe patient may have a small touch of dementia unknown baseline.      DVT prophylaxis: Lovenox Code Status: Full Family Communication:  Status is: Inpatient    Dispo: The patient is from: Home              Anticipated d/c is to: Home  Anticipated d/c date is: 10/4              Patient currently unstable      Consultants:    Procedures/Significant Events:    I have personally reviewed and interpreted all radiology studies and my findings are as above.  VENTILATOR SETTINGS: HFNC 10/3 Flow; 12 L/min SPO2; 88%   Cultures   Antimicrobials: Anti-infectives (From admission, onward)   Start     Ordered Stop   11/13/19 1000  remdesivir 100 mg in sodium chloride 0.9 % 100 mL IVPB       "Followed by" Linked Group Details   11/12/19 2035 11/17/19 0959   11/12/19 2100  remdesivir 200 mg in sodium  chloride 0.9% 250 mL IVPB       "Followed by" Linked Group Details   11/12/19 2035 11/12/19 2239       Devices    LINES / TUBES:      Continuous Infusions:    Objective: Vitals:   11/15/19 0633 11/15/19 1330 11/15/19 2034 11/16/19 0605  BP: 132/70 (!) 148/71 138/75 121/72  Pulse: 75 87 84 75  Resp: (!) 24 (!) 22 (!) 25 (!) 24  Temp: 98.1 F (36.7 C) 98.5 F (36.9 C) (!) 97.5 F (36.4 C) 97.7 F (36.5 C)  TempSrc:   Oral Oral  SpO2: (!) 86% 91% (!) 88% (!) 88%  Weight:      Height:        Intake/Output Summary (Last 24 hours) at 11/16/2019 1100 Last data filed at 11/16/2019 0300 Gross per 24 hour  Intake 240 ml  Output 1200 ml  Net -960 ml   Filed Weights   11/12/19 2202 11/14/19 1303 11/15/19 0500  Weight: 87.1 kg 90.7 kg 90.1 kg   Physical Exam:  General: A/o X 4 positive  acute respiratory distress Eyes: negative scleral hemorrhage, negative anisocoria, negative icterus ENT: Negative Runny nose, negative gingival bleeding, Neck:  Negative scars, masses, torticollis, lymphadenopathy, JVD Lungs: decreased breath sounds bilaterally without wheezes, positive crackles Cardiovascular: Regular rate and rhythm without murmur gallop or rub normal S1 and S2 Abdomen: negative abdominal pain, nondistended, positive soft, bowel sounds, no rebound, no ascites, no appreciable mass Extremities: No significant cyanosis, clubbing, or edema bilateral lower extremities Skin: Negative rashes, lesions, ulcers Psychiatric:  Negative depression, negative anxiety, negative fatigue, negative mania  Central nervous system:  Cranial nerves II through XII intact, tongue/uvula midline, all extremities muscle strength 5/5, sensation intact throughout, negative dysarthria, negative expressive aphasia, negative receptive aphasia.  .     Data Reviewed: Care during the described time interval was provided by me .  I have reviewed this patient's available data, including medical history,  events of note, physical examination, and all test results as part of my evaluation.  CBC: Recent Labs  Lab 11/12/19 1807 11/13/19 0152 11/14/19 0317 11/15/19 0553 11/16/19 0531  WBC 4.7 8.1 8.3 10.6* 8.6  NEUTROABS 3.8 6.9 7.0 9.5* 7.7  HGB 13.6 14.1 13.1 12.6* 12.1*  HCT 39.2 41.4 38.4* 37.5* 35.6*  MCV 86.2 87.0 86.7 87.4 87.0  PLT 150 164 190 229 852   Basic Metabolic Panel: Recent Labs  Lab 11/12/19 1807 11/13/19 0152 11/13/19 1250 11/14/19 0317 11/15/19 0939 11/16/19 0531  NA 133* 138  --  138 138 141  K 3.9 4.2  --  3.9 3.7 3.9  CL 104 102  --  105 106 108  CO2 16* 17*  --  22 21* 22  GLUCOSE 207* 258*  --  295* 424* 306*  BUN 43* 46*  --  50* 58* 58*  CREATININE 1.59* 1.83*  --  1.42* 1.35* 1.32*  CALCIUM 7.9* 8.5*  --  8.4* 8.4* 8.5*  MG  --   --  2.0 2.1 2.3 2.3  PHOS  --   --  3.8 2.9 3.6 3.5   GFR: Estimated Creatinine Clearance: 56.3 mL/min (A) (by C-G formula based on SCr of 1.32 mg/dL (H)). Liver Function Tests: Recent Labs  Lab 11/12/19 1807 11/13/19 0152 11/14/19 0317 11/15/19 0939 11/16/19 0531  AST 55* 81* 73* 60* 48*  ALT 32 42 44 47* 44  ALKPHOS 50 56 51 51 49  BILITOT 1.3* 1.3* 0.9 1.3* 1.4*  PROT 6.6 7.5 6.7 6.3* 6.1*  ALBUMIN 3.3* 3.7 3.2* 3.2* 3.1*   No results for input(s): LIPASE, AMYLASE in the last 168 hours. No results for input(s): AMMONIA in the last 168 hours. Coagulation Profile: No results for input(s): INR, PROTIME in the last 168 hours. Cardiac Enzymes: No results for input(s): CKTOTAL, CKMB, CKMBINDEX, TROPONINI in the last 168 hours. BNP (last 3 results) No results for input(s): PROBNP in the last 8760 hours. HbA1C: No results for input(s): HGBA1C in the last 72 hours. CBG: Recent Labs  Lab 11/14/19 2139 11/15/19 0738 11/15/19 1128 11/15/19 1618 11/15/19 2128  GLUCAP 274* 301* 359* 373* 210*   Lipid Profile: Recent Labs    11/14/19 0317  CHOL 83  HDL 22*  LDLCALC 51  TRIG 51  CHOLHDL 3.8    Thyroid Function Tests: No results for input(s): TSH, T4TOTAL, FREET4, T3FREE, THYROIDAB in the last 72 hours. Anemia Panel: Recent Labs    11/15/19 0553 11/16/19 0531  FERRITIN 1,219* 929*   Sepsis Labs: Recent Labs  Lab 11/12/19 1806 11/12/19 1807 11/12/19 2203 11/14/19 1108 11/14/19 1340  PROCALCITON  --  <0.10  --   --   --   LATICACIDVEN 1.3  --  3.4* 3.2* 2.0*    Recent Results (from the past 240 hour(s))  Blood Culture (routine x 2)     Status: None (Preliminary result)   Collection Time: 11/12/19  6:07 PM   Specimen: BLOOD  Result Value Ref Range Status   Specimen Description   Final    BLOOD RIGHT ARM Performed at [place] [address], [place], [place] [address]    Special Requests   Final    BOTTLES DRAWN AEROBIC AND ANAEROBIC Blood Culture adequate volume Performed at [facility] [address] [place], [place] [address]    Culture   Final    NO GROWTH 3 DAYS Performed at [facility], [place] [address] [place], [place] [address]    Report Status PENDING  Incomplete  Blood Culture (routine x 2)     Status: None (Preliminary result)   Collection Time: 11/12/19  6:17 PM   Specimen: BLOOD  Result Value Ref Range Status   Specimen Description   Final    BLOOD RIGHT ANTECUBITAL Performed at [facility] [address], [place], [place] [address]    Special Requests   Final    BOTTLES DRAWN AEROBIC AND ANAEROBIC Blood Culture results may not be optimal due to an inadequate volume of blood received in culture bottles Performed at [facility] [address], [place],  [address]    Culture   Final    NO GROWTH 3 DAYS Performed at [facility], [place]  [address] [place], [place] [address]    Report Status PENDING  Incomplete         Radiology Studies: No results found.      Scheduled Meds: . vitamin C  500 mg Oral Daily  . aspirin EC  81  mg Oral Daily  . atorvastatin  80 mg Oral q1800  . baricitinib  2 mg Oral Daily  . enoxaparin (LOVENOX) injection  40 mg Subcutaneous Q24H  . furosemide  60 mg Intravenous Once  . insulin aspart  0-15 Units Subcutaneous TID WC  . insulin aspart  0-5 Units Subcutaneous QHS  . insulin aspart  10 Units Subcutaneous TID WC  . insulin detemir  15 Units Subcutaneous Daily  . Ipratropium-Albuterol  1 puff Inhalation Q6H  . metoprolol succinate  25 mg Oral BID  . multivitamin with minerals  1 tablet Oral Daily  . predniSONE  50 mg Oral Daily  . sodium bicarbonate  650 mg Oral BID  . zinc sulfate  220 mg Oral Daily   Continuous Infusions:    LOS: 4 days    Time spent:40 min    Tylene Quashie, Geraldo Docker, MD [facility] Pager [phone]  If 7PM-7AM, please contact night-coverage [email or web address] Password [email or web address] 11/16/2019, 11:00 AM

## 2019-11-17 ENCOUNTER — Inpatient Hospital Stay (HOSPITAL_COMMUNITY): Payer: PPO

## 2019-11-17 DIAGNOSIS — J9601 Acute respiratory failure with hypoxia: Secondary | ICD-10-CM | POA: Diagnosis not present

## 2019-11-17 DIAGNOSIS — U071 COVID-19: Secondary | ICD-10-CM | POA: Diagnosis not present

## 2019-11-17 DIAGNOSIS — I5032 Chronic diastolic (congestive) heart failure: Secondary | ICD-10-CM

## 2019-11-17 DIAGNOSIS — I2510 Atherosclerotic heart disease of native coronary artery without angina pectoris: Secondary | ICD-10-CM | POA: Diagnosis not present

## 2019-11-17 DIAGNOSIS — R7989 Other specified abnormal findings of blood chemistry: Secondary | ICD-10-CM | POA: Diagnosis not present

## 2019-11-17 LAB — COMPREHENSIVE METABOLIC PANEL
ALT: 49 U/L — ABNORMAL HIGH (ref 0–44)
AST: 44 U/L — ABNORMAL HIGH (ref 15–41)
Albumin: 3.2 g/dL — ABNORMAL LOW (ref 3.5–5.0)
Alkaline Phosphatase: 49 U/L (ref 38–126)
Anion gap: 10 (ref 5–15)
BUN: 55 mg/dL — ABNORMAL HIGH (ref 8–23)
CO2: 25 mmol/L (ref 22–32)
Calcium: 8.6 mg/dL — ABNORMAL LOW (ref 8.9–10.3)
Chloride: 108 mmol/L (ref 98–111)
Creatinine, Ser: 1.46 mg/dL — ABNORMAL HIGH (ref 0.61–1.24)
GFR calc Af Amer: 56 mL/min — ABNORMAL LOW (ref >60)
GFR calc non Af Amer: 48 mL/min — ABNORMAL LOW (ref >60)
Glucose, Bld: 154 mg/dL — ABNORMAL HIGH (ref 70–99)
Potassium: 3.4 mmol/L — ABNORMAL LOW (ref 3.5–5.1)
Sodium: 143 mmol/L (ref 135–145)
Total Bilirubin: 1.4 mg/dL — ABNORMAL HIGH (ref 0.3–1.2)
Total Protein: 6.1 g/dL — ABNORMAL LOW (ref 6.5–8.1)

## 2019-11-17 LAB — CBC WITH DIFFERENTIAL/PLATELET
Abs Immature Granulocytes: 0.08 10*3/uL — ABNORMAL HIGH (ref 0.00–0.07)
Basophils Absolute: 0 10*3/uL (ref 0.0–0.1)
Basophils Relative: 0 %
Eosinophils Absolute: 0 10*3/uL (ref 0.0–0.5)
Eosinophils Relative: 0 %
HCT: 35.9 % — ABNORMAL LOW (ref 39.0–52.0)
Hemoglobin: 12.1 g/dL — ABNORMAL LOW (ref 13.0–17.0)
Immature Granulocytes: 1 %
Lymphocytes Relative: 7 %
Lymphs Abs: 0.7 10*3/uL (ref 0.7–4.0)
MCH: 29.2 pg (ref 26.0–34.0)
MCHC: 33.7 g/dL (ref 30.0–36.0)
MCV: 86.5 fL (ref 80.0–100.0)
Monocytes Absolute: 0.5 10*3/uL (ref 0.1–1.0)
Monocytes Relative: 5 %
Neutro Abs: 9.3 10*3/uL — ABNORMAL HIGH (ref 1.7–7.7)
Neutrophils Relative %: 87 %
Platelets: 265 10*3/uL (ref 150–400)
RBC: 4.15 MIL/uL — ABNORMAL LOW (ref 4.22–5.81)
RDW: 13.1 % (ref 11.5–15.5)
WBC: 10.6 10*3/uL — ABNORMAL HIGH (ref 4.0–10.5)
nRBC: 0 % (ref 0.0–0.2)

## 2019-11-17 LAB — GLUCOSE, CAPILLARY
Glucose-Capillary: 315 mg/dL — ABNORMAL HIGH (ref 70–99)
Glucose-Capillary: 137 mg/dL — ABNORMAL HIGH (ref 70–99)
Glucose-Capillary: 126 mg/dL — ABNORMAL HIGH (ref 70–99)
Glucose-Capillary: 307 mg/dL — ABNORMAL HIGH (ref 70–99)
Glucose-Capillary: 145 mg/dL — ABNORMAL HIGH (ref 70–99)

## 2019-11-17 LAB — CULTURE, BLOOD (ROUTINE X 2)
Culture: NO GROWTH
Culture: NO GROWTH
Special Requests: ADEQUATE

## 2019-11-17 LAB — PHOSPHORUS: Phosphorus: 3.2 mg/dL (ref 2.5–4.6)

## 2019-11-17 LAB — ECHOCARDIOGRAM COMPLETE
Area-P 1/2: 2.62 cm2
Calc EF: 47.8 %
Height: 71 in
S' Lateral: 3.9 cm
Single Plane A2C EF: 50.5 %
Single Plane A4C EF: 47.3 %
Weight: 3227.53 oz

## 2019-11-17 LAB — LACTATE DEHYDROGENASE: LDH: 356 U/L — ABNORMAL HIGH (ref 98–192)

## 2019-11-17 LAB — MAGNESIUM: Magnesium: 2.3 mg/dL (ref 1.7–2.4)

## 2019-11-17 LAB — FERRITIN: Ferritin: 682 ng/mL — ABNORMAL HIGH (ref 24–336)

## 2019-11-17 LAB — C-REACTIVE PROTEIN: CRP: 1.4 mg/dL — ABNORMAL HIGH (ref <1.0)

## 2019-11-17 LAB — D-DIMER, QUANTITATIVE: D-Dimer, Quant: 0.95 ug/mL-FEU — ABNORMAL HIGH (ref 0.00–0.50)

## 2019-11-17 MED ORDER — POTASSIUM CHLORIDE CRYS ER 20 MEQ PO TBCR
50.0000 meq | EXTENDED_RELEASE_TABLET | Freq: Once | ORAL | Status: AC
  Administered 2019-11-17: 50 meq via ORAL
  Filled 2019-11-17: qty 2

## 2019-11-17 MED ORDER — IPRATROPIUM-ALBUTEROL 20-100 MCG/ACT IN AERS
1.0000 | INHALATION_SPRAY | Freq: Three times a day (TID) | RESPIRATORY_TRACT | Status: DC
  Administered 2019-11-17 – 2019-11-24 (×23): 1 via RESPIRATORY_TRACT
  Filled 2019-11-17: qty 4

## 2019-11-17 NOTE — Progress Notes (Signed)
  Echocardiogram 2D Echocardiogram has been performed.  Taylor [name] 11/17/2019, 3:15 PM

## 2019-11-17 NOTE — Progress Notes (Signed)
PROGRESS NOTE    JERMAN TINNON  ZMO:[id number] DOB: [birth year] DOA: 11/12/2019 PCP: Haywood Pao, MD     Brief Narrative:  Taylor [name] is a 69 y.o.WM PMHx CAD s/p CABG in 2019, ischemic cardiomyopathy with EF of 40-45%/diastolic CHF grade 2, HTN, nephrolithiasis, DM type II trolled with complication, HLD.  Pt presents to the ED with c/o hypoxia.  Pt has been having nonproductive cough for about a week.  [facility]  Went to [facility] infusion clinic earlier today.  Got MAB infusion but during infusion became hypoxic to 86% on RA.  Put on 4L with improvement in sat and sent to ED.  Patient has had continued fevers, myalgias as well as diarrhea without melena or blood per rectum.  No abd pain.  No h/o PE nor DVT.   Subjective: 10/4 Afebrile overnight A/O x4, positive S OB.  Patient continues using significant amount of O2.  Negative abdominal pain   Assessment & Plan: Covid vaccination;   Principal Problem:   Acute hypoxemic respiratory failure due to COVID-19 [facility]) Active Problems:   Essential hypertension   Hyperlipidemia associated with type 2 diabetes mellitus ([place])   Coronary artery disease involving native coronary artery of native heart without angina pectoris   Ischemic cardiomyopathy - pre-CABG ECHO: 40-45%   Renal insufficiency  Severe sepsis -On admission patient meets guidelines RR> 20, temp> 38 C, site of infection Lung, lactic acid> 2   Acute respiratory failure with hypoxia/Covid pneumonia COVID-19 Labs  Recent Labs    11/15/19 0553 11/15/19 0939 11/16/19 0531 11/17/19 0500  DDIMER 0.75*  --  0.77* 0.95*  FERRITIN 1,219*  --  929* 682*  LDH  --  341* 318* 356*  CRP 4.4*  --  2.2* 1.4*    [facility] -Baricitinib 2 mg daily x14 days per pharmacy protocol -Solu-Medrol 80 mg BID -Remdesivir per pharmacy protocol -Vitamin C and zinc per Covid protocol -Respimat QID -Titrate  O2 to maintain SPO2> 88% -Prone patient 8 hours/day; if patient cannot tolerate prone 2 to 3 hours per shift --10/4 PCXR pending   Ischemic cardiomyopathy/CAD -Strict ins and out -3.2L -Daily weight Filed Weights   11/14/19 1303 11/15/19 0500 11/17/19 0527  Weight: 90.7 kg 90.1 kg 91.5 kg  -Toprol 25 mg BID -10/2 Lasix IV 60 mg x 1 -10/3 Lasix IV 40 mg x 1 --10/4 Echocardiogram Pending   HTN -See cardiomyopathy  HLD -Lipitor 80 mg daily -10/1 LDL = 51   DM type II uncontrolled with complication -0/35 hemoglobin A1c= 7.7 -10/3 increase Levemir 10 units BID -10/3 increase NovoLog 14 units qac  -Moderate SSI  CKD stage IIIa( Baseline Cr 1.26 on 01/14/2019) Lab Results  Component Value Date   CREATININE 1.46 (H) 11/17/2019   CREATININE 1.32 (H) 11/16/2019   CREATININE 1.35 (H) 11/15/2019   CREATININE 1.42 (H) 11/14/2019   CREATININE 1.83 (H) 11/13/2019   -Initially came in with mild normal anion gap acidosis; ACEI held.  Lactate level normal -Continue sodium bicarb 650 mg BID -Improving we will hold on renal ultrasound for now.  Uremia vs dementia -The elevated therefore believe patient may have a small touch of dementia unknown baseline.  Hypokalemia --Potassium Goal >4 --10/4 K-Dur 15meq       DVT prophylaxis: Lovenox Code Status: Full Family Communication:  Status is: Inpatient    Dispo: The patient is from: Home  Anticipated d/c is to: Home              Anticipated d/c date is: 10/4              Patient currently unstable      Consultants:    Procedures/Significant Events:  10/4 PCXR;-subtle airspace disease in the right lung is new since the prior examination and most consistent with pneumonia.   I have personally reviewed and interpreted all radiology studies and my findings are as above.  VENTILATOR SETTINGS: HFNC 10/3 Flow; 12 L/min SPO2; 88%   Cultures   Antimicrobials: Anti-infectives (From admission, onward)    Start     Ordered Stop   11/13/19 1000  remdesivir 100 mg in sodium chloride 0.9 % 100 mL IVPB       "Followed by" Linked Group Details   11/12/19 2035 11/17/19 0959   11/12/19 2100  remdesivir 200 mg in sodium chloride 0.9% 250 mL IVPB       "Followed by" Linked Group Details   11/12/19 2035 11/12/19 2239       Devices    LINES / TUBES:      Continuous Infusions:    Objective: Vitals:   11/16/19 1955 11/16/19 2000 11/16/19 2017 11/17/19 0527  BP:   (!) 141/77 140/84  Pulse:   86 74  Resp:   18 (!) 24  Temp:   98.6 F (37 C) 98.8 F (37.1 C)  TempSrc:      SpO2: (!) 82% 91% (!) 88% (!) 87%  Weight:    91.5 kg  Height:        Intake/Output Summary (Last 24 hours) at 11/17/2019 1307 Last data filed at 11/17/2019 0747 Gross per 24 hour  Intake 120 ml  Output 2325 ml  Net -2205 ml   Filed Weights   11/14/19 1303 11/15/19 0500 11/17/19 0527  Weight: 90.7 kg 90.1 kg 91.5 kg    Physical Exam:  General: A/O x 4 positive acute respiratory distress Eyes: negative scleral hemorrhage, negative anisocoria, negative icterus ENT: Negative Runny nose, negative gingival bleeding, Neck:  Negative scars, masses, torticollis, lymphadenopathy, JVD Lungs: Clear to auscultation bilaterally without wheezes or crackles Cardiovascular: Regular rate and rhythm without murmur gallop or rub normal S1 and S2 Abdomen: negative abdominal pain, nondistended, positive soft, bowel sounds, no rebound, no ascites, no appreciable mass Extremities: No significant cyanosis, clubbing, or edema bilateral lower extremities Skin: Negative rashes, lesions, ulcers Psychiatric:  Negative depression, negative anxiety, negative fatigue, negative mania  Central nervous system:  Cranial nerves II through XII intact, tongue/uvula midline, all extremities muscle strength 5/5, sensation intact throughout, negative dysarthria, negative expressive aphasia, negative receptive aphasia.  .     Data Reviewed:  Care during the described time interval was provided by me .  I have reviewed this patient's available data, including medical history, events of note, physical examination, and all test results as part of my evaluation.  CBC: Recent Labs  Lab 11/13/19 0152 11/14/19 0317 11/15/19 0553 11/16/19 0531 11/17/19 0500  WBC 8.1 8.3 10.6* 8.6 10.6*  NEUTROABS 6.9 7.0 9.5* 7.7 9.3*  HGB 14.1 13.1 12.6* 12.1* 12.1*  HCT 41.4 38.4* 37.5* 35.6* 35.9*  MCV 87.0 86.7 87.4 87.0 86.5  PLT 164 190 229 227 732   Basic Metabolic Panel: Recent Labs  Lab 11/13/19 0152 11/13/19 1250 11/14/19 0317 11/15/19 0939 11/16/19 0531 11/17/19 0500  NA 138  --  138 138 141 143  K 4.2  --  3.9 3.7 3.9 3.4*  CL 102  --  105 106 108 108  CO2 17*  --  22 21* 22 25  GLUCOSE 258*  --  295* 424* 306* 154*  BUN 46*  --  50* 58* 58* 55*  CREATININE 1.83*  --  1.42* 1.35* 1.32* 1.46*  CALCIUM 8.5*  --  8.4* 8.4* 8.5* 8.6*  MG  --  2.0 2.1 2.3 2.3 2.3  PHOS  --  3.8 2.9 3.6 3.5 3.2   GFR: Estimated Creatinine Clearance: 55.2 mL/min (A) (by C-G formula based on SCr of 1.46 mg/dL (H)). Liver Function Tests: Recent Labs  Lab 11/13/19 0152 11/14/19 0317 11/15/19 0939 11/16/19 0531 11/17/19 0500  AST 81* 73* 60* 48* 44*  ALT 42 44 47* 44 49*  ALKPHOS 56 51 51 49 49  BILITOT 1.3* 0.9 1.3* 1.4* 1.4*  PROT 7.5 6.7 6.3* 6.1* 6.1*  ALBUMIN 3.7 3.2* 3.2* 3.1* 3.2*   No results for input(s): LIPASE, AMYLASE in the last 168 hours. No results for input(s): AMMONIA in the last 168 hours. Coagulation Profile: No results for input(s): INR, PROTIME in the last 168 hours. Cardiac Enzymes: No results for input(s): CKTOTAL, CKMB, CKMBINDEX, TROPONINI in the last 168 hours. BNP (last 3 results) No results for input(s): PROBNP in the last 8760 hours. HbA1C: No results for input(s): HGBA1C in the last 72 hours. CBG: Recent Labs  Lab 11/16/19 1152 11/16/19 1629 11/16/19 2116 11/17/19 0745 11/17/19 1117  GLUCAP  353* 278* 187* 137* 126*   Lipid Profile: No results for input(s): CHOL, HDL, LDLCALC, TRIG, CHOLHDL, LDLDIRECT in the last 72 hours. Thyroid Function Tests: No results for input(s): TSH, T4TOTAL, FREET4, T3FREE, THYROIDAB in the last 72 hours. Anemia Panel: Recent Labs    11/16/19 0531 11/17/19 0500  FERRITIN 929* 682*   Sepsis Labs: Recent Labs  Lab 11/12/19 1806 11/12/19 1807 11/12/19 2203 11/14/19 1108 11/14/19 1340  PROCALCITON  --  <0.10  --   --   --   LATICACIDVEN 1.3  --  3.4* 3.2* 2.0*    Recent Results (from the past 240 hour(s))  Blood Culture (routine x 2)     Status: None   Collection Time: 11/12/19  6:07 PM   Specimen: BLOOD  Result Value Ref Range Status   Specimen Description   Final    BLOOD RIGHT ARM Performed at [facility] [address], [place], [place] [address]    Special Requests   Final    BOTTLES DRAWN AEROBIC AND ANAEROBIC Blood Culture adequate volume Performed at [facility] [address], [place], [place] [address]    Culture   Final    NO GROWTH 5 DAYS Performed at [facility], [place] [address], [place], [place] [address]    Report Status 11/17/2019 FINAL  Final  Blood Culture (routine x 2)     Status: None   Collection Time: 11/12/19  6:17 PM   Specimen: BLOOD  Result Value Ref Range Status   Specimen Description   Final    BLOOD RIGHT ANTECUBITAL Performed at [facility] [address], [place], [place] [address]    Special Requests   Final    BOTTLES DRAWN AEROBIC AND ANAEROBIC Blood Culture results may not be optimal due to an inadequate volume of blood received in culture bottles Performed at [facility] [address], [place], [place] [address]    Culture   Final  NO GROWTH 5 DAYS Performed at [facility], [place] [address], [place], [place] [address]    Report Status 11/17/2019 FINAL  Final         Radiology  Studies: No results found.      Scheduled Meds: . vitamin C  500 mg Oral Daily  . aspirin EC  81 mg Oral Daily  . atorvastatin  80 mg Oral q1800  . baricitinib  2 mg Oral Daily  . enoxaparin (LOVENOX) injection  40 mg Subcutaneous Q24H  . furosemide  60 mg Intravenous Once  . insulin aspart  0-15 Units Subcutaneous TID WC  . insulin aspart  0-5 Units Subcutaneous QHS  . insulin aspart  14 Units Subcutaneous TID WC  . insulin detemir  10 Units Subcutaneous BID  . Ipratropium-Albuterol  1 puff Inhalation TID  . metoprolol succinate  25 mg Oral BID  . multivitamin with minerals  1 tablet Oral Daily  . predniSONE  50 mg Oral Daily  . sodium bicarbonate  650 mg Oral BID  . zinc sulfate  220 mg Oral Daily   Continuous Infusions:    LOS: 5 days    Time spent:40 min    Kristl Morioka, Geraldo Docker, MD [facility] Pager [phone]  If 7PM-7AM, please contact night-coverage [email or web address] Password [email or web address] 11/17/2019, 1:07 PM

## 2019-11-17 NOTE — Care Management Important Message (Signed)
Important Message  Patient Details IM Letter given to the Patient Name: Taylor [name] MRN: [id number] Date of Birth: 1950/05/12   Medicare Important Message Given:  Yes     Kerin Salen 11/17/2019, 12:21 PM

## 2019-11-18 DIAGNOSIS — R7989 Other specified abnormal findings of blood chemistry: Secondary | ICD-10-CM | POA: Diagnosis not present

## 2019-11-18 DIAGNOSIS — J9601 Acute respiratory failure with hypoxia: Secondary | ICD-10-CM | POA: Diagnosis not present

## 2019-11-18 DIAGNOSIS — U071 COVID-19: Secondary | ICD-10-CM | POA: Diagnosis not present

## 2019-11-18 DIAGNOSIS — I2510 Atherosclerotic heart disease of native coronary artery without angina pectoris: Secondary | ICD-10-CM | POA: Diagnosis not present

## 2019-11-18 LAB — COMPREHENSIVE METABOLIC PANEL
ALT: 48 U/L — ABNORMAL HIGH (ref 0–44)
AST: 39 U/L (ref 15–41)
Albumin: 3.6 g/dL (ref 3.5–5.0)
Alkaline Phosphatase: 56 U/L (ref 38–126)
Anion gap: 11 (ref 5–15)
BUN: 41 mg/dL — ABNORMAL HIGH (ref 8–23)
CO2: 24 mmol/L (ref 22–32)
Calcium: 8.7 mg/dL — ABNORMAL LOW (ref 8.9–10.3)
Chloride: 108 mmol/L (ref 98–111)
Creatinine, Ser: 1.18 mg/dL (ref 0.61–1.24)
GFR calc Af Amer: >60 mL/min (ref >60)
GFR calc non Af Amer: >60 mL/min (ref >60)
Glucose, Bld: 74 mg/dL (ref 70–99)
Potassium: 3.4 mmol/L — ABNORMAL LOW (ref 3.5–5.1)
Sodium: 143 mmol/L (ref 135–145)
Total Bilirubin: 1.4 mg/dL — ABNORMAL HIGH (ref 0.3–1.2)
Total Protein: 6.6 g/dL (ref 6.5–8.1)

## 2019-11-18 LAB — CBC WITH DIFFERENTIAL/PLATELET
Abs Immature Granulocytes: 0.09 10*3/uL — ABNORMAL HIGH (ref 0.00–0.07)
Basophils Absolute: 0 10*3/uL (ref 0.0–0.1)
Basophils Relative: 0 %
Eosinophils Absolute: 0 10*3/uL (ref 0.0–0.5)
Eosinophils Relative: 0 %
HCT: 39.9 % (ref 39.0–52.0)
Hemoglobin: 13.4 g/dL (ref 13.0–17.0)
Immature Granulocytes: 1 %
Lymphocytes Relative: 8 %
Lymphs Abs: 1 10*3/uL (ref 0.7–4.0)
MCH: 29.6 pg (ref 26.0–34.0)
MCHC: 33.6 g/dL (ref 30.0–36.0)
MCV: 88.3 fL (ref 80.0–100.0)
Monocytes Absolute: 0.6 10*3/uL (ref 0.1–1.0)
Monocytes Relative: 5 %
Neutro Abs: 10.7 10*3/uL — ABNORMAL HIGH (ref 1.7–7.7)
Neutrophils Relative %: 86 %
Platelets: 339 10*3/uL (ref 150–400)
RBC: 4.52 MIL/uL (ref 4.22–5.81)
RDW: 13.1 % (ref 11.5–15.5)
WBC: 12.3 10*3/uL — ABNORMAL HIGH (ref 4.0–10.5)
nRBC: 0 % (ref 0.0–0.2)

## 2019-11-18 LAB — GLUCOSE, CAPILLARY
Glucose-Capillary: 88 mg/dL (ref 70–99)
Glucose-Capillary: 196 mg/dL — ABNORMAL HIGH (ref 70–99)
Glucose-Capillary: 130 mg/dL — ABNORMAL HIGH (ref 70–99)
Glucose-Capillary: 56 mg/dL — ABNORMAL LOW (ref 70–99)
Glucose-Capillary: 104 mg/dL — ABNORMAL HIGH (ref 70–99)
Glucose-Capillary: 58 mg/dL — ABNORMAL LOW (ref 70–99)

## 2019-11-18 LAB — MAGNESIUM: Magnesium: 2.1 mg/dL (ref 1.7–2.4)

## 2019-11-18 LAB — FERRITIN: Ferritin: 666 ng/mL — ABNORMAL HIGH (ref 24–336)

## 2019-11-18 LAB — PHOSPHORUS: Phosphorus: 3.1 mg/dL (ref 2.5–4.6)

## 2019-11-18 LAB — D-DIMER, QUANTITATIVE: D-Dimer, Quant: 1.05 ug/mL-FEU — ABNORMAL HIGH (ref 0.00–0.50)

## 2019-11-18 LAB — C-REACTIVE PROTEIN: CRP: 2.6 mg/dL — ABNORMAL HIGH (ref <1.0)

## 2019-11-18 LAB — LACTATE DEHYDROGENASE: LDH: 343 U/L — ABNORMAL HIGH (ref 98–192)

## 2019-11-18 MED ORDER — BARICITINIB 2 MG PO TABS
4.0000 mg | ORAL_TABLET | Freq: Every day | ORAL | Status: DC
  Administered 2019-11-19 – 2019-11-21 (×3): 4 mg via ORAL
  Filled 2019-11-18 (×3): qty 2

## 2019-11-18 MED ORDER — POTASSIUM CHLORIDE 10 MEQ/100ML IV SOLN
10.0000 meq | INTRAVENOUS | Status: AC
  Administered 2019-11-18 (×4): 10 meq via INTRAVENOUS
  Filled 2019-11-18 (×4): qty 100

## 2019-11-18 MED ORDER — POTASSIUM CHLORIDE CRYS ER 20 MEQ PO TBCR
50.0000 meq | EXTENDED_RELEASE_TABLET | Freq: Once | ORAL | Status: AC
  Administered 2019-11-18: 50 meq via ORAL
  Filled 2019-11-18: qty 2

## 2019-11-18 NOTE — Progress Notes (Signed)
This shift educated pt on the importance of proper positioning and I.S/flutter valve use. Hesitant about side positioning but did comply O2 saturation maintaining at 88-92% when positioned R to L side. Will continue to monitor

## 2019-11-18 NOTE — Progress Notes (Signed)
PROGRESS NOTE    QADIR FOLKS  DTO:[id number] DOB: 02/05/1951 DOA: 11/12/2019 PCP: Haywood Pao, MD     Brief Narrative:  Taylor [name] is a 69 y.o.WM PMHx CAD s/p CABG in 2019, ischemic cardiomyopathy with EF of 40-45%/diastolic CHF grade 2, HTN, nephrolithiasis, DM type II trolled with complication, HLD.  Pt presents to the ED with c/o hypoxia.  Pt has been having nonproductive cough for about a week.  [facility].  Went to [facility] infusion clinic earlier today.  Got MAB infusion but during infusion became hypoxic to 86% on RA.  Put on 4L with improvement in sat and sent to ED.  Patient has had continued fevers, myalgias as well as diarrhea without melena or blood per rectum.  No abd pain.  No h/o PE nor DVT.   Subjective: 10/5 Afebrile overnight A/O x4, positive S OB sitting comfortably in bed.  Negative abdominal pain   Assessment & Plan: Covid vaccination;   Principal Problem:   Acute hypoxemic respiratory failure due to COVID-19 [facility]) Active Problems:   Essential hypertension   Hyperlipidemia associated with type 2 diabetes mellitus ([place])   Coronary artery disease involving native coronary artery of native heart without angina pectoris   Ischemic cardiomyopathy - pre-CABG ECHO: 40-45%   Renal insufficiency  Severe sepsis -On admission patient meets guidelines RR> 20, temp> 38 C, site of infection Lung, lactic acid> 2   Acute respiratory failure with hypoxia/Covid pneumonia COVID-19 Labs  Recent Labs    11/16/19 0531 11/17/19 0500 11/18/19 0515  DDIMER 0.77* 0.95* 1.05*  FERRITIN 929* 682* 666*  LDH 318* 356* 343*  CRP 2.2* 1.4* 2.6*    [facility]06/[facility]   -Baricitinib 2 mg daily x14 days per pharmacy protocol -Solu-Medrol 80 mg BID -Remdesivir per pharmacy protocol -Vitamin C and zinc per Covid protocol -Respimat QID -Titrate O2 to maintain SPO2> 88% -Prone patient 8 hours/day; if  patient cannot tolerate prone 2 to 3 hours per shift --10/4 PCXR pending   Ischemic cardiomyopathy/CAD -Strict ins and out -3.0 L -Daily weight Filed Weights   11/14/19 1303 11/15/19 0500 11/17/19 0527  Weight: 90.7 kg 90.1 kg 91.5 kg  -Toprol 25 mg BID -10/2 Lasix IV 60 mg x 1 -10/3 Lasix IV 40 mg x 1 --10/4 Echocardiogram; patient's cardiac function improved from previous echo 01/01/2018 see results below    HTN -See cardiomyopathy  HLD -Lipitor 80 mg daily -10/1 LDL = 51   DM type II uncontrolled with complication -9/83 hemoglobin A1c= 7.7 -10/3 increase Levemir 10 units BID -10/3 increase NovoLog 14 units qac  -Moderate SSI  CKD stage IIIa( Baseline Cr 1.26 on 01/14/2019) Lab Results  Component Value Date   CREATININE 1.18 11/18/2019   CREATININE 1.46 (H) 11/17/2019   CREATININE 1.32 (H) 11/16/2019   CREATININE 1.35 (H) 11/15/2019   CREATININE 1.42 (H) 11/14/2019   -Initially came in with mild normal anion gap acidosis; ACEI held.  Lactate level normal -Continue sodium bicarb 650 mg BID -Improving we will hold on renal ultrasound for now.  Uremia vs dementia -The elevated therefore believe patient may have a small touch of dementia unknown baseline.  Hypokalemia --Potassium Goal >4 --10/5 K-Dur 45meq +Potassium IV 40 meq      DVT prophylaxis: Lovenox Code Status: Full Family Communication:  Status is: Inpatient    Dispo: The patient is from: Home  Anticipated d/c is to: Home              Anticipated d/c date is: 10/4              Patient currently unstable      Consultants:    Procedures/Significant Events:  10/4 PCXR;-subtle airspace disease in the right lung is new since the prior examination and most consistent with pneumonia. 10/5 Echocardiogram:Left Ventricle: Abnormal septal motion.  LVEF =50 to 55%.   Left ventricular diastolic parameters were normal.  Left Atrium: moderately dilated.     I have personally reviewed  and interpreted all radiology studies and my findings are as above.  VENTILATOR SETTINGS: HFNC 10/5 Flow; 15L/min SPO2; 87%   Cultures 11/06/2019 [facility]   Antimicrobials: Anti-infectives (From admission, onward)   Start     Ordered Stop   11/13/19 1000  remdesivir 100 mg in sodium chloride 0.9 % 100 mL IVPB       "Followed by" Linked Group Details   11/12/19 2035 11/16/19 0915   11/12/19 2100  remdesivir 200 mg in sodium chloride 0.9% 250 mL IVPB       "Followed by" Linked Group Details   11/12/19 2035 11/12/19 2239       Devices    LINES / TUBES:      Continuous Infusions:    Objective: Vitals:   11/17/19 1355 11/17/19 2015 11/17/19 2130 11/18/19 0600  BP: 137/62  138/85   Pulse: 80  83   Resp: 20  16   Temp: (!) 97.5 F (36.4 C)   97.7 F (36.5 C)  TempSrc: Oral   Oral  SpO2: 91% (!) 89% (!) 87%   Weight:      Height:        Intake/Output Summary (Last 24 hours) at 11/18/2019 1214 Last data filed at 11/18/2019 1046 Gross per 24 hour  Intake 1068 ml  Output 1125 ml  Net -57 ml   Filed Weights   11/14/19 1303 11/15/19 0500 11/17/19 0527  Weight: 90.7 kg 90.1 kg 91.5 kg    Physical Exam:  General: A/O x 4 positive acute respiratory distress Eyes: negative scleral hemorrhage, negative anisocoria, negative icterus ENT: Negative Runny nose, negative gingival bleeding, Neck:  Negative scars, masses, torticollis, lymphadenopathy, JVD Lungs: Clear to auscultation bilaterally without wheezes or crackles Cardiovascular: Regular rate and rhythm without murmur gallop or rub normal S1 and S2 Abdomen: negative abdominal pain, nondistended, positive soft, bowel sounds, no rebound, no ascites, no appreciable mass Extremities: No significant cyanosis, clubbing, or edema bilateral lower extremities Skin: Negative rashes, lesions, ulcers Psychiatric:  Negative depression, negative anxiety, negative fatigue, negative mania  Central  nervous system:  Cranial nerves II through XII intact, tongue/uvula midline, all extremities muscle strength 5/5, sensation intact throughout, negative dysarthria, negative expressive aphasia, negative receptive aphasia.  .     Data Reviewed: Care during the described time interval was provided by me .  I have reviewed this patient's available data, including medical history, events of note, physical examination, and all test results as part of my evaluation.  CBC: Recent Labs  Lab 11/14/19 0317 11/15/19 0553 11/16/19 0531 11/17/19 0500 11/18/19 0515  WBC 8.3 10.6* 8.6 10.6* 12.3*  NEUTROABS 7.0 9.5* 7.7 9.3* 10.7*  HGB 13.1 12.6* 12.1* 12.1* 13.4  HCT 38.4* 37.5* 35.6* 35.9* 39.9  MCV 86.7 87.4 87.0 86.5 88.3  PLT 190 229 227 265 353   Basic Metabolic Panel: Recent Labs  Lab  11/14/19 0317 11/15/19 0939 11/16/19 0531 11/17/19 0500 11/18/19 0515  NA 138 138 141 143 143  K 3.9 3.7 3.9 3.4* 3.4*  CL 105 106 108 108 108  CO2 22 21* 22 25 24   GLUCOSE 295* 424* 306* 154* 74  BUN 50* 58* 58* 55* 41*  CREATININE 1.42* 1.35* 1.32* 1.46* 1.18  CALCIUM 8.4* 8.4* 8.5* 8.6* 8.7*  MG 2.1 2.3 2.3 2.3 2.1  PHOS 2.9 3.6 3.5 3.2 3.1   GFR: Estimated Creatinine Clearance: 68.4 mL/min (by C-G formula based on SCr of 1.18 mg/dL). Liver Function Tests: Recent Labs  Lab 11/14/19 0317 11/15/19 0939 11/16/19 0531 11/17/19 0500 11/18/19 0515  AST 73* 60* 48* 44* 39  ALT 44 47* 44 49* 48*  ALKPHOS 51 51 49 49 56  BILITOT 0.9 1.3* 1.4* 1.4* 1.4*  PROT 6.7 6.3* 6.1* 6.1* 6.6  ALBUMIN 3.2* 3.2* 3.1* 3.2* 3.6   No results for input(s): LIPASE, AMYLASE in the last 168 hours. No results for input(s): AMMONIA in the last 168 hours. Coagulation Profile: No results for input(s): INR, PROTIME in the last 168 hours. Cardiac Enzymes: No results for input(s): CKTOTAL, CKMB, CKMBINDEX, TROPONINI in the last 168 hours. BNP (last 3 results) No results for input(s): PROBNP in the last 8760  hours. HbA1C: No results for input(s): HGBA1C in the last 72 hours. CBG: Recent Labs  Lab 11/17/19 1117 11/17/19 1711 11/17/19 2147 11/18/19 0755 11/18/19 1103  GLUCAP 126* 307* 145* 88 196*   Lipid Profile: No results for input(s): CHOL, HDL, LDLCALC, TRIG, CHOLHDL, LDLDIRECT in the last 72 hours. Thyroid Function Tests: No results for input(s): TSH, T4TOTAL, FREET4, T3FREE, THYROIDAB in the last 72 hours. Anemia Panel: Recent Labs    11/17/19 0500 11/18/19 0515  FERRITIN 682* 666*   Sepsis Labs: Recent Labs  Lab 11/12/19 1806 11/12/19 1807 11/12/19 2203 11/14/19 1108 11/14/19 1340  PROCALCITON  --  <0.10  --   --   --   LATICACIDVEN 1.3  --  3.4* 3.2* 2.0*    Recent Results (from the past 240 hour(s))  Blood Culture (routine x 2)     Status: None   Collection Time: 11/12/19  6:07 PM   Specimen: BLOOD  Result Value Ref Range Status   Specimen Description   Final    BLOOD RIGHT ARM Performed at [facility] [address], [place], [place] [address]    Special Requests   Final    BOTTLES DRAWN AEROBIC AND ANAEROBIC Blood Culture adequate volume Performed at [facility] [address], [place], [place] [address]    Culture   Final    NO GROWTH 5 DAYS Performed at [facility], [place] [address], [place], [place] [address]    Report Status 11/17/2019 FINAL  Final  Blood Culture (routine x 2)     Status: None   Collection Time: 11/12/19  6:17 PM   Specimen: BLOOD  Result Value Ref Range Status   Specimen Description   Final    BLOOD RIGHT ANTECUBITAL Performed at [facility] [address] [place], [place] [address]    Special Requests   Final    BOTTLES DRAWN AEROBIC AND ANAEROBIC Blood Culture results may not be optimal due to an inadequate volume of blood received in culture bottles Performed at [facility] [address] [place], [place] [address]    Culture    Final    NO GROWTH 5  DAYS Performed at [facility], [place] [address] [place], [place] [address]    Report Status 11/17/2019 FINAL  Final         Radiology Studies: DG CHEST PORT 1 VIEW  Result Date: 11/17/2019 CLINICAL DATA:  COVID-19 positive patient requiring oxygen. EXAM: PORTABLE CHEST 1 VIEW COMPARISON:  Single-view of the chest 11/12/2019. FINDINGS: Subtle, patchy foci of airspace opacity are best visualized in the right mid lung zone and new since the prior exam. Heart size is upper normal. The patient is status post CABG. No pneumothorax or pleural effusion. IMPRESSION: Subtle airspace disease in the right lung is new since the prior examination and most consistent with pneumonia. Electronically Signed   By: Inge Rise M.D.   On: 11/17/2019 14:16   ECHOCARDIOGRAM COMPLETE  Result Date: 11/17/2019    ECHOCARDIOGRAM REPORT   Patient Name:   KALA GASSMANN Date of Exam: 11/17/2019 Medical Rec #:  [id number]        Height:       71.0 in Accession #:    [id number]       Weight:       201.7 lb Date of Birth:  1950-09-24        BSA:          2.116 m Patient Age:    6 years         BP:           137/62 mmHg Patient Gender: M                HR:           79 bpm. Exam Location:  Inpatient Procedure: 2D Echo, Cardiac Doppler and Color Doppler Indications:    H85.27 Chronic diastolic (congestive) heart failure  History:        Patient has prior history of Echocardiogram examinations, most                 recent 01/01/2018. Cardiomyopathy, CAD, Abnormal ECG and Prior                 CABG, Arrythmias:Ventricular bigeminy; Risk Factors:Diabetes,                 Hypertension and Dyslipidemia. Covid positive. Hypoxia. PSVT.  Sonographer:    Roseanna Rainbow RDCS Referring Phys: [id number] [place]  Sonographer Comments: Technically difficult study due to poor echo windows. Image acquisition challenging due to patient body habitus. Apical images were located in patient armpit region. IMPRESSIONS  1.  Abnormal septal motion . Left ventricular ejection fraction, by estimation, is 50 to 55%. The left ventricle has low normal function. The left ventricle has no regional wall motion abnormalities. There is mild left ventricular hypertrophy. Left ventricular diastolic parameters were normal.  2. Right ventricular systolic function is normal. The right ventricular size is normal.  3. Left atrial size was moderately dilated.  4. The mitral valve is normal in structure. Trivial mitral valve regurgitation. No evidence of mitral stenosis.  5. The aortic valve is tricuspid. Aortic valve regurgitation is not visualized. Mild to moderate aortic valve sclerosis/calcification is present, without any evidence of aortic stenosis.  6. The inferior vena cava is normal in size with greater than 50% respiratory variability, suggesting right atrial pressure of 3 mmHg. FINDINGS  Left Ventricle: Abnormal septal motion. Left ventricular ejection fraction, by estimation, is 50 to 55%. The left ventricle has low normal function. The left ventricle has no regional wall motion  abnormalities. The left ventricular internal cavity size was normal in size. There is mild left ventricular hypertrophy. Left ventricular diastolic parameters were normal. Right Ventricle: The right ventricular size is normal. No increase in right ventricular wall thickness. Right ventricular systolic function is normal. Left Atrium: Left atrial size was moderately dilated. Right Atrium: Right atrial size was normal in size. Pericardium: There is no evidence of pericardial effusion. Mitral Valve: The mitral valve is normal in structure. Trivial mitral valve regurgitation. No evidence of mitral valve stenosis. Tricuspid Valve: The tricuspid valve is normal in structure. Tricuspid valve regurgitation is mild . No evidence of tricuspid stenosis. Aortic Valve: The aortic valve is tricuspid. Aortic valve regurgitation is not visualized. Mild to moderate aortic valve  sclerosis/calcification is present, without any evidence of aortic stenosis. Pulmonic Valve: The pulmonic valve was normal in structure. Pulmonic valve regurgitation is mild. No evidence of pulmonic stenosis. Aorta: The aortic root is normal in size and structure. Venous: The inferior vena cava is normal in size with greater than 50% respiratory variability, suggesting right atrial pressure of 3 mmHg. IAS/Shunts: No atrial level shunt detected by color flow Doppler.  LEFT VENTRICLE PLAX 2D LVIDd:         5.30 cm      Diastology LVIDs:         3.90 cm      LV e' medial:    4.03 cm/s LV PW:         1.50 cm      LV E/e' medial:  14.4 LV IVS:        1.20 cm      LV e' lateral:   8.38 cm/s LVOT diam:     2.20 cm      LV E/e' lateral: 6.9 LV SV:         68 LV SV Index:   32 LVOT Area:     3.80 cm  LV Volumes (MOD) LV vol d, MOD A2C: 116.0 ml LV vol d, MOD A4C: 114.5 ml LV vol s, MOD A2C: 57.4 ml LV vol s, MOD A4C: 60.4 ml LV SV MOD A2C:     58.6 ml LV SV MOD A4C:     114.5 ml LV SV MOD BP:      57.4 ml RIGHT VENTRICLE         IVC TAPSE (M-mode): 1.4 cm  IVC diam: 2.60 cm LEFT ATRIUM           Index       RIGHT ATRIUM           Index LA diam:      4.90 cm 2.32 cm/m  RA Area:     17.10 cm LA Vol (A2C): 38.0 ml 17.96 ml/m RA Volume:   39.30 ml  18.57 ml/m LA Vol (A4C): 60.3 ml 28.49 ml/m  AORTIC VALVE             PULMONIC VALVE LVOT Vmax:   90.20 cm/s  PR End Diast Vel: 2.00 msec LVOT Vmean:  65.400 cm/s LVOT VTI:    0.179 m  AORTA Ao Root diam: 4.00 cm Ao Asc diam:  3.60 cm MITRAL VALVE MV Area (PHT): 2.62 cm    SHUNTS MV Decel Time: 289 msec    Systemic VTI:  0.18 m MV E velocity: 58.20 cm/s  Systemic Diam: 2.20 cm MV A velocity: 74.70 cm/s MV E/A ratio:  0.78 Jenkins Rouge MD Electronically signed by Jenkins Rouge MD Signature Date/Time: 11/17/2019/4:03:06 PM  Final         Scheduled Meds: . vitamin C  500 mg Oral Daily  . aspirin EC  81 mg Oral Daily  . atorvastatin  80 mg Oral q1800  . [START ON  11/19/2019] baricitinib  4 mg Oral Daily  . enoxaparin (LOVENOX) injection  40 mg Subcutaneous Q24H  . furosemide  60 mg Intravenous Once  . insulin aspart  0-15 Units Subcutaneous TID WC  . insulin aspart  0-5 Units Subcutaneous QHS  . insulin aspart  14 Units Subcutaneous TID WC  . insulin detemir  10 Units Subcutaneous BID  . Ipratropium-Albuterol  1 puff Inhalation TID  . metoprolol succinate  25 mg Oral BID  . multivitamin with minerals  1 tablet Oral Daily  . predniSONE  50 mg Oral Daily  . sodium bicarbonate  650 mg Oral BID  . zinc sulfate  220 mg Oral Daily   Continuous Infusions:    LOS: 6 days    Time spent:40 min    Tesha Archambeau, Geraldo Docker, MD [facility] Pager [phone]  If 7PM-7AM, please contact night-coverage [email or web address] Password [email or web address] 11/18/2019, 12:14 PM

## 2019-11-18 NOTE — Progress Notes (Signed)
Pt had an episode of dirahhea.  Pt pulled off all of his monitoring equipment and oxygen and walked to the bathroom.  Pt was satting at 78% while sitting on the toilet.  Portable O2 brought in and Pt hooked up to that.  Safely got pt back into bed.  After walking to the bathroom pt starting requiring more O2.  Non needed the non-rebreather on top of the [place].  Explained to patient why he should not get up by himself. Pt verbalized understanding of this and stated he would call RN next time he had to use the bathroom.      BGM 59 @ 2059 given 1 apple juice BGM rechecked 15 minutes later.  Still below 70 at recheck treated with soda.  Rechecked 15 minutes later.  BGM over 70.  Paged on call hospitalist about whether to given long acting insulin still.  MD advised to still give background insulin and to recheck BGM around 0400-0500.

## 2019-11-19 ENCOUNTER — Inpatient Hospital Stay (HOSPITAL_COMMUNITY): Payer: PPO

## 2019-11-19 DIAGNOSIS — U071 COVID-19: Secondary | ICD-10-CM | POA: Diagnosis not present

## 2019-11-19 DIAGNOSIS — J9601 Acute respiratory failure with hypoxia: Secondary | ICD-10-CM | POA: Diagnosis not present

## 2019-11-19 LAB — GLUCOSE, CAPILLARY
Glucose-Capillary: 131 mg/dL — ABNORMAL HIGH (ref 70–99)
Glucose-Capillary: 128 mg/dL — ABNORMAL HIGH (ref 70–99)
Glucose-Capillary: 102 mg/dL — ABNORMAL HIGH (ref 70–99)
Glucose-Capillary: 163 mg/dL — ABNORMAL HIGH (ref 70–99)
Glucose-Capillary: 132 mg/dL — ABNORMAL HIGH (ref 70–99)
Glucose-Capillary: 185 mg/dL — ABNORMAL HIGH (ref 70–99)

## 2019-11-19 LAB — CBC WITH DIFFERENTIAL/PLATELET
Abs Immature Granulocytes: 0.07 10*3/uL (ref 0.00–0.07)
Basophils Absolute: 0 10*3/uL (ref 0.0–0.1)
Basophils Relative: 0 %
Eosinophils Absolute: 0 10*3/uL (ref 0.0–0.5)
Eosinophils Relative: 0 %
HCT: 37.4 % — ABNORMAL LOW (ref 39.0–52.0)
Hemoglobin: 12.7 g/dL — ABNORMAL LOW (ref 13.0–17.0)
Immature Granulocytes: 1 %
Lymphocytes Relative: 6 %
Lymphs Abs: 0.6 10*3/uL — ABNORMAL LOW (ref 0.7–4.0)
MCH: 29.7 pg (ref 26.0–34.0)
MCHC: 34 g/dL (ref 30.0–36.0)
MCV: 87.4 fL (ref 80.0–100.0)
Monocytes Absolute: 0.3 10*3/uL (ref 0.1–1.0)
Monocytes Relative: 4 %
Neutro Abs: 8.7 10*3/uL — ABNORMAL HIGH (ref 1.7–7.7)
Neutrophils Relative %: 89 %
Platelets: 328 10*3/uL (ref 150–400)
RBC: 4.28 MIL/uL (ref 4.22–5.81)
RDW: 13.3 % (ref 11.5–15.5)
WBC: 9.7 10*3/uL (ref 4.0–10.5)
nRBC: 0 % (ref 0.0–0.2)

## 2019-11-19 LAB — COMPREHENSIVE METABOLIC PANEL
ALT: 39 U/L (ref 0–44)
AST: 29 U/L (ref 15–41)
Albumin: 3 g/dL — ABNORMAL LOW (ref 3.5–5.0)
Alkaline Phosphatase: 49 U/L (ref 38–126)
Anion gap: 5 (ref 5–15)
BUN: 33 mg/dL — ABNORMAL HIGH (ref 8–23)
CO2: 24 mmol/L (ref 22–32)
Calcium: 8.4 mg/dL — ABNORMAL LOW (ref 8.9–10.3)
Chloride: 106 mmol/L (ref 98–111)
Creatinine, Ser: 1.08 mg/dL (ref 0.61–1.24)
GFR calc non Af Amer: >60 mL/min (ref >60)
Glucose, Bld: 122 mg/dL — ABNORMAL HIGH (ref 70–99)
Potassium: 3.8 mmol/L (ref 3.5–5.1)
Sodium: 135 mmol/L (ref 135–145)
Total Bilirubin: 1.6 mg/dL — ABNORMAL HIGH (ref 0.3–1.2)
Total Protein: 6.4 g/dL — ABNORMAL LOW (ref 6.5–8.1)

## 2019-11-19 LAB — LACTATE DEHYDROGENASE: LDH: 308 U/L — ABNORMAL HIGH (ref 98–192)

## 2019-11-19 LAB — FERRITIN: Ferritin: 702 ng/mL — ABNORMAL HIGH (ref 24–336)

## 2019-11-19 LAB — PHOSPHORUS: Phosphorus: 2.9 mg/dL (ref 2.5–4.6)

## 2019-11-19 LAB — D-DIMER, QUANTITATIVE: D-Dimer, Quant: 1.38 ug/mL-FEU — ABNORMAL HIGH (ref 0.00–0.50)

## 2019-11-19 LAB — C-REACTIVE PROTEIN: CRP: 7 mg/dL — ABNORMAL HIGH (ref <1.0)

## 2019-11-19 LAB — MAGNESIUM: Magnesium: 2 mg/dL (ref 1.7–2.4)

## 2019-11-19 MED ORDER — METHYLPREDNISOLONE SODIUM SUCC 125 MG IJ SOLR
80.0000 mg | Freq: Two times a day (BID) | INTRAMUSCULAR | Status: DC
  Administered 2019-11-19 – 2019-11-22 (×7): 80 mg via INTRAVENOUS
  Filled 2019-11-19 (×6): qty 2

## 2019-11-19 NOTE — Progress Notes (Signed)
[facility] Progress Note  Patient: Taylor [name]    IWL:[id number]  DOA: 11/12/2019     Date of Service: the patient was seen and examined on 11/19/2019  Brief hospital course: Past medical history of CAD SP CABG, ischemic cardiomyopathy, HTN, type II DM, HLD. Presents with complaints of cough and shortness of breath. Currently plan is continue current care.  Assessment and Plan: 1. Acute COVID-19 Viral Pneumonia Acute hypoxic respiratory failure CXR: hazy bilateral peripheral opacities Oxygen requirement: On 15 LPM CRP: 12.7-1.4-2.6-7.0 Remdesivir: Completed the treatment Steroids: On oral prednisone. Now back on IV Solu-Medrol Baricitinib/Actemra(off-label use): Currently on 4 mg daily dose. As renal function improved. The investigational nature of this medication was discussed with the patient/HCPOA and they choose to proceed as the potential benefits are felt to outweigh risks at this time.  Antibiotics: Not indicated Vitamin C and Zinc: Continue DVT Prophylaxis: enoxaparin (LOVENOX) injection 40 mg Start: 11/12/19 2200 Prone positioning and incentive spirometer use recommended.  The treatment plan and use of medications and known side effects were discussed with patient/family. It was clearly explained that Complete risks and long-term side effects are unknown, however in the best clinical judgment they seem to be of some clinical benefit rather than medical risks. Patient/family agree with the treatment plan and want to receive these treatments as indicated.   2. Essential hypertension. Ischemic cardiomyopathy. CAD. Continue Toprol, as needed Lasix. Echocardiogram shows improvement in EF.  3. Type 2 diabetes mellitus, uncontrolled with hyperglycemia with hyperlipidemia and renal dysfunction Continue sliding scale insulin. Discontinue scheduled premeal coverage given hypoglycemia. Continue Levemir as the patient will be on steroids.  4. CKD 3 a Renal function stable.  Continue to monitor.  5. Hypokalemia Continue to replace. Monitor.   Diet: Cardiac diet carb modified DVT Prophylaxis:   enoxaparin (LOVENOX) injection 40 mg Start: 11/12/19 2200    Advance goals of care discussion: Full code  Family Communication: NO family was present at bedside, at the time of interview.   Disposition:  Status is: Inpatient  Remains inpatient appropriate because:Hemodynamically unstable   Dispo: The patient is from: Home              Anticipated d/c is to: Home              Anticipated d/c date is: > 3 days              Patient currently is not medically stable to d/c.        Subjective: Continues to have shortness of breath no nausea or vomiting. Also fatigue and tiredness. No fever no chills. Flat affect.  Physical Exam:  General: Appear in mild distress, no Rash; Oral Mucosa Clear, moist. no Abnormal Neck Mass Or lumps, Conjunctiva normal  Cardiovascular: S1 and S2 Present, no Murmur, Respiratory: good respiratory effort, Bilateral Air entry present and CTA, no Crackles, no wheezes Abdomen: Bowel Sound present, Soft and no tenderness Extremities: no Pedal edema Neurology: alert and oriented to time, place, and person affect appropriate. no new focal deficit Gait not checked due to patient safety concerns  Vitals:   11/19/19 0353 11/19/19 0445 11/19/19 0604 11/19/19 1327  BP: 140/85   136/76  Pulse: 86 97 83 93  Resp: (!) 21   20  Temp: 98.5 F (36.9 C)   98 F (36.7 C)  TempSrc: Axillary     SpO2: 91% (!) 88% 91% 90%  Weight: 81.6 kg     Height:  Intake/Output Summary (Last 24 hours) at 11/19/2019 1906 Last data filed at 11/19/2019 1830 Gross per 24 hour  Intake 600 ml  Output 1400 ml  Net -800 ml   Filed Weights   11/15/19 0500 11/17/19 0527 11/19/19 0353  Weight: 90.1 kg 91.5 kg 81.6 kg    Data Reviewed: I have personally reviewed and interpreted daily labs, tele strips, imagings as discussed above. I reviewed all  nursing notes, pharmacy notes, vitals, pertinent old records I have discussed plan of care as described above with RN and patient/family.  CBC: Recent Labs  Lab 11/15/19 0553 11/16/19 0531 11/17/19 0500 11/18/19 0515 11/19/19 0442  WBC 10.6* 8.6 10.6* 12.3* 9.7  NEUTROABS 9.5* 7.7 9.3* 10.7* 8.7*  HGB 12.6* 12.1* 12.1* 13.4 12.7*  HCT 37.5* 35.6* 35.9* 39.9 37.4*  MCV 87.4 87.0 86.5 88.3 87.4  PLT 229 227 265 339 161   Basic Metabolic Panel: Recent Labs  Lab 11/15/19 0939 11/16/19 0531 11/17/19 0500 11/18/19 0515 11/19/19 0442  NA 138 141 143 143 135  K 3.7 3.9 3.4* 3.4* 3.8  CL 106 108 108 108 106  CO2 21* 22 25 24 24   GLUCOSE 424* 306* 154* 74 122*  BUN 58* 58* 55* 41* 33*  CREATININE 1.35* 1.32* 1.46* 1.18 1.08  CALCIUM 8.4* 8.5* 8.6* 8.7* 8.4*  MG 2.3 2.3 2.3 2.1 2.0  PHOS 3.6 3.5 3.2 3.1 2.9    Studies: DG CHEST PORT 1 VIEW  Result Date: 11/19/2019 CLINICAL DATA:  69 year old male with shortness of breath. Positive COVID-19. EXAM: PORTABLE CHEST 1 VIEW COMPARISON:  Portable chest 11/17/2019 and earlier. FINDINGS: Portable AP semi upright view at 1043 hours. Prior CABG. Stable cardiac size and mediastinal contours. Stable lung volumes. Increasing patchy bilateral peripheral pulmonary opacity. No pneumothorax, pulmonary edema or pleural effusion. Negative visible bowel gas pattern. Stable visualized osseous structures. IMPRESSION: Mild radiographic progression of bilateral COVID-19 pneumonia. Electronically Signed   By: Genevie Ann M.D.   On: 11/19/2019 11:08    Scheduled Meds: . vitamin C  500 mg Oral Daily  . aspirin EC  81 mg Oral Daily  . atorvastatin  80 mg Oral q1800  . baricitinib  4 mg Oral Daily  . enoxaparin (LOVENOX) injection  40 mg Subcutaneous Q24H  . insulin aspart  0-15 Units Subcutaneous TID WC  . insulin aspart  0-5 Units Subcutaneous QHS  . insulin detemir  10 Units Subcutaneous BID  . Ipratropium-Albuterol  1 puff Inhalation TID  .  methylPREDNISolone (SOLU-MEDROL) injection  80 mg Intravenous BID  . metoprolol succinate  25 mg Oral BID  . multivitamin with minerals  1 tablet Oral Daily  . zinc sulfate  220 mg Oral Daily   Continuous Infusions: PRN Meds: acetaminophen, chlorpheniramine-HYDROcodone, guaiFENesin-dextromethorphan, lip balm, ondansetron **OR** ondansetron (ZOFRAN) IV  Time spent: 35 minutes  Author: Berle Mull, MD [facility] 11/19/2019 7:06 PM  To reach On-call, see care teams to locate the attending and reach out via [email or web address]. Between 7PM-7AM, please contact night-coverage If you still have difficulty reaching the attending provider, please page the [facility] (Director on Call) for [facility] on amion for assistance.

## 2019-11-20 DIAGNOSIS — U071 COVID-19: Secondary | ICD-10-CM | POA: Diagnosis not present

## 2019-11-20 DIAGNOSIS — J9601 Acute respiratory failure with hypoxia: Secondary | ICD-10-CM | POA: Diagnosis not present

## 2019-11-20 LAB — COMPREHENSIVE METABOLIC PANEL WITH GFR
ALT: 35 U/L (ref 0–44)
AST: 23 U/L (ref 15–41)
Albumin: 2.9 g/dL — ABNORMAL LOW (ref 3.5–5.0)
Alkaline Phosphatase: 52 U/L (ref 38–126)
Anion gap: 9 (ref 5–15)
BUN: 36 mg/dL — ABNORMAL HIGH (ref 8–23)
CO2: 24 mmol/L (ref 22–32)
Calcium: 8.6 mg/dL — ABNORMAL LOW (ref 8.9–10.3)
Chloride: 105 mmol/L (ref 98–111)
Creatinine, Ser: 1.27 mg/dL — ABNORMAL HIGH (ref 0.61–1.24)
GFR calc non Af Amer: 57 mL/min — ABNORMAL LOW
Glucose, Bld: 198 mg/dL — ABNORMAL HIGH (ref 70–99)
Potassium: 4.2 mmol/L (ref 3.5–5.1)
Sodium: 138 mmol/L (ref 135–145)
Total Bilirubin: 1.5 mg/dL — ABNORMAL HIGH (ref 0.3–1.2)
Total Protein: 6.4 g/dL — ABNORMAL LOW (ref 6.5–8.1)

## 2019-11-20 LAB — GLUCOSE, CAPILLARY
Glucose-Capillary: 166 mg/dL — ABNORMAL HIGH (ref 70–99)
Glucose-Capillary: 216 mg/dL — ABNORMAL HIGH (ref 70–99)
Glucose-Capillary: 255 mg/dL — ABNORMAL HIGH (ref 70–99)
Glucose-Capillary: 271 mg/dL — ABNORMAL HIGH (ref 70–99)
Glucose-Capillary: 334 mg/dL — ABNORMAL HIGH (ref 70–99)

## 2019-11-20 LAB — CBC WITH DIFFERENTIAL/PLATELET
Abs Immature Granulocytes: 0.06 10*3/uL (ref 0.00–0.07)
Basophils Absolute: 0 10*3/uL (ref 0.0–0.1)
Basophils Relative: 0 %
Eosinophils Absolute: 0 10*3/uL (ref 0.0–0.5)
Eosinophils Relative: 0 %
HCT: 39.6 % (ref 39.0–52.0)
Hemoglobin: 13.4 g/dL (ref 13.0–17.0)
Immature Granulocytes: 1 %
Lymphocytes Relative: 7 %
Lymphs Abs: 0.4 10*3/uL — ABNORMAL LOW (ref 0.7–4.0)
MCH: 29.8 pg (ref 26.0–34.0)
MCHC: 33.8 g/dL (ref 30.0–36.0)
MCV: 88.2 fL (ref 80.0–100.0)
Monocytes Absolute: 0.1 10*3/uL (ref 0.1–1.0)
Monocytes Relative: 2 %
Neutro Abs: 5.3 10*3/uL (ref 1.7–7.7)
Neutrophils Relative %: 90 %
Platelets: 370 10*3/uL (ref 150–400)
RBC: 4.49 MIL/uL (ref 4.22–5.81)
RDW: 13.2 % (ref 11.5–15.5)
WBC: 5.9 10*3/uL (ref 4.0–10.5)
nRBC: 0 % (ref 0.0–0.2)

## 2019-11-20 LAB — MAGNESIUM: Magnesium: 2.4 mg/dL (ref 1.7–2.4)

## 2019-11-20 LAB — C-REACTIVE PROTEIN: CRP: 9.2 mg/dL — ABNORMAL HIGH

## 2019-11-20 LAB — D-DIMER, QUANTITATIVE: D-Dimer, Quant: 1.22 ug/mL-FEU — ABNORMAL HIGH (ref 0.00–0.50)

## 2019-11-20 NOTE — Evaluation (Signed)
Physical Therapy Evaluation Patient Details Name: Taylor [name] MRN: [id number] DOB: Nov 18, 1950 Today's Date: 11/20/2019   History of Present Illness  Taylor [name] is a 69 y.o.WM PMHx CAD s/p CABG in 2019, ischemic cardiomyopathy with EF of 40-45%/diastolic CHF grade 2, HTN, nephrolithiasis, DM type II trolled with complication, HLD.Tested positive for [facility] 11[facility]06[facility].  Went to [place] infusion clinic 11/12/19, during infusion became hypoxic to 86% on RA.  Put on 4L with improvement in sat and sent to ED.  Clinical Impression  The patient resting in recliner on15 [place] at 88%. Patient eager to ambulate in room. Patient progressed to ambulate350' on 15 L HFNC, dropping  To 78% with return to 85% with standing rest breaks x 3 within 1.5 minutes/. HR 75 resting to 115 ambulating.  Continue to progress ambulation and exercises and encouraged IS and porning. Reports mostly sidelying. Pt admitted with above diagnosis.  Pt currently with functional limitations due to the deficits listed below (see PT Problem List). Pt will benefit from skilled PT to increase their independence and safety with mobility to allow discharge to the venue listed below.       Follow Up Recommendations No PT follow up    Equipment Recommendations  None recommended by PT    Recommendations for Other Services OT consult     Precautions / Restrictions Precautions Precaution Comments: sats      Mobility  Bed Mobility Overal bed mobility: Independent                Transfers Overall transfer level: Needs assistance Equipment used: None Transfers: Sit to/from Stand Sit to Stand: Supervision         General transfer comment: patient sloghtly unsteady  upon standing.  Ambulation/Gait Ambulation/Gait assistance: Min guard Gait Distance (Feet): 350 Feet Assistive device: None Gait Pattern/deviations: Step-through pattern;Drifts right/left Gait velocity: decr   General Gait Details: occasional  slight balance off  Stairs            Wheelchair Mobility    Modified Rankin (Stroke Patients Only)       Balance Overall balance assessment: Mild deficits observed, not formally tested                                           Pertinent Vitals/Pain Pain Assessment: No/denies pain    Home Living Family/patient expects to be discharged to:: Private residence Living Arrangements: Spouse/significant other Available Help at Discharge: Family Type of Home: House Home Access: Stairs to enter   Technical brewer of Steps: 3 Home Layout: One level        Prior Function Level of Independence: Independent               Hand Dominance        Extremity/Trunk Assessment   Upper Extremity Assessment Upper Extremity Assessment: Overall WFL for tasks assessed    Lower Extremity Assessment Lower Extremity Assessment: Overall WFL for tasks assessed    Cervical / Trunk Assessment Cervical / Trunk Assessment: Normal  Communication      Cognition Arousal/Alertness: Awake/alert Behavior During Therapy: WFL for tasks assessed/performed Overall Cognitive Status: Within Functional Limits for tasks assessed  General Comments      Exercises     Assessment/Plan    PT Assessment Patient needs continued PT services  PT Problem List Decreased activity tolerance;Cardiopulmonary status limiting activity;Decreased mobility;Decreased balance       PT Treatment Interventions Gait training;Functional mobility training;Therapeutic activities;Therapeutic exercise;Patient/family education    PT Goals (Current goals can be found in the Care Plan section)  Acute Rehab PT Goals Patient Stated Goal: go home PT Goal Formulation: With patient Time For Goal Achievement: 12/04/19 Potential to Achieve Goals: Good    Frequency Min 3X/week   Barriers to discharge        Co-evaluation                AM-PAC PT "6 Clicks" Mobility  Outcome Measure Help needed turning from your back to your side while in a flat bed without using bedrails?: None Help needed moving from lying on your back to sitting on the side of a flat bed without using bedrails?: None Help needed moving to and from a bed to a chair (including a wheelchair)?: A Little Help needed standing up from a chair using your arms (e.g., wheelchair or bedside chair)?: A Little Help needed to walk in hospital room?: A Little Help needed climbing 3-5 steps with a railing? : A Lot 6 Click Score: 19    End of Session Equipment Utilized During Treatment: Oxygen Activity Tolerance: Patient tolerated treatment well Patient left: in bed;with call bell/phone within reach Nurse Communication: Mobility status PT Visit Diagnosis: Difficulty in walking, not elsewhere classified (R26.2)    Time: 2505-3976 PT Time Calculation (min) (ACUTE ONLY): 25 min   Charges:   PT Evaluation $PT Eval Low Complexity: 1 Low PT Treatments $Gait Training: 8-22 mins        [place] Pager [phone] Office [phone]   Claretha Cooper 11/20/2019, 3:15 PM

## 2019-11-20 NOTE — Progress Notes (Signed)
[facility] Progress Note  Patient: Taylor [name]    MEQ:[id number]  DOA: 11/12/2019     Date of Service: the patient was seen and examined on 11/20/2019  Brief hospital course: Past medical history of CAD SP CABG, ischemic cardiomyopathy, HTN, type II DM, HLD. Presents with complaints of cough and shortness of breath. Currently plan is continue current care.  Assessment and Plan: 1. Acute COVID-19 Viral Pneumonia Acute hypoxic respiratory failure CXR: hazy bilateral peripheral opacities Oxygen requirement: On 15 LPM CRP: 12.7-1.4-2.6-7.0 Remdesivir: Completed the treatment Steroids: On oral prednisone. Now back on IV Solu-Medrol Baricitinib/Actemra(off-label use): Currently on 4 mg daily dose. As renal function improved. The investigational nature of this medication was discussed with the patient/HCPOA and they choose to proceed as the potential benefits are felt to outweigh risks at this time.  Antibiotics: Not indicated Vitamin C and Zinc: Continue DVT Prophylaxis: enoxaparin (LOVENOX) injection 40 mg Start: 11/12/19 2200 Prone positioning and incentive spirometer use recommended.  The treatment plan and use of medications and known side effects were discussed with patient/family. It was clearly explained that Complete risks and long-term side effects are unknown, however in the best clinical judgment they seem to be of some clinical benefit rather than medical risks. Patient/family agree with the treatment plan and want to receive these treatments as indicated.   2. Essential hypertension. Ischemic cardiomyopathy. CAD. Continue Toprol, as needed Lasix. Echocardiogram shows improvement in EF.  3. Type 2 diabetes mellitus, uncontrolled with hyperglycemia with hyperlipidemia and renal dysfunction Continue sliding scale insulin. Discontinue scheduled premeal coverage given hypoglycemia. Continue Levemir as the patient will be on steroids.  4.  Acute kidney injury on CKD 3  a Renal function now stable. Continue to monitor.  5. Hypokalemia Continue to replace. Monitor.  Diet: Cardiac diet carb modified DVT Prophylaxis:   enoxaparin (LOVENOX) injection 40 mg Start: 11/12/19 2200  Advance goals of care discussion: Full code  Family Communication: NO family was present at bedside, at the time of interview.   Disposition:  Status is: Inpatient  Remains inpatient appropriate because:Hemodynamically unstable  Dispo: The patient is from: Home              Anticipated d/c is to: Home              Anticipated d/c date is: > 3 days              Patient currently is not medically stable to d/c.  Subjective: More cheerful today.  No nausea no vomiting.  No acute complaint.  No fever no chills.  Continues to have cough but improving from yesterday.  Physical Exam:  General: Appear in mild distress, no Rash; Oral Mucosa Clear, moist. no Abnormal Neck Mass Or lumps, Conjunctiva normal  Cardiovascular: S1 and S2 Present, no Murmur, Respiratory: increased respiratory effort, Bilateral Air entry present and bilateral  Crackles, no wheezes Abdomen: Bowel Sound present, Soft and no tenderness Extremities: no Pedal edema Neurology: alert and oriented to time, place, and person affect appropriate. no new focal deficit Gait not checked due to patient safety concerns   Vitals:   11/20/19 0500 11/20/19 0540 11/20/19 1500 11/20/19 2031  BP:   138/76 (!) 144/80  Pulse:  68 77 77  Resp:   20 15  Temp:   98 F (36.7 C) 97.6 F (36.4 C)  TempSrc:   Oral   SpO2:  94% 93% 91%  Weight: 86.7 kg     Height:  Intake/Output Summary (Last 24 hours) at 11/20/2019 2043 Last data filed at 11/20/2019 2041 Gross per 24 hour  Intake 720 ml  Output 1250 ml  Net -530 ml   Filed Weights   11/17/19 0527 11/19/19 0353 11/20/19 0500  Weight: 91.5 kg 81.6 kg 86.7 kg    Data Reviewed: I have personally reviewed and interpreted daily labs, tele strips, imagings as  discussed above. I reviewed all nursing notes, pharmacy notes, vitals, pertinent old records I have discussed plan of care as described above with RN and patient/family.  CBC: Recent Labs  Lab 11/16/19 0531 11/17/19 0500 11/18/19 0515 11/19/19 0442 11/20/19 0402  WBC 8.6 10.6* 12.3* 9.7 5.9  NEUTROABS 7.7 9.3* 10.7* 8.7* 5.3  HGB 12.1* 12.1* 13.4 12.7* 13.4  HCT 35.6* 35.9* 39.9 37.4* 39.6  MCV 87.0 86.5 88.3 87.4 88.2  PLT 227 265 339 328 540   Basic Metabolic Panel: Recent Labs  Lab 11/15/19 0939 11/15/19 0939 11/16/19 0531 11/17/19 0500 11/18/19 0515 11/19/19 0442 11/20/19 0402  NA 138   < > 141 143 143 135 138  K 3.7   < > 3.9 3.4* 3.4* 3.8 4.2  CL 106   < > 108 108 108 106 105  CO2 21*   < > 22 25 24 24 24   GLUCOSE 424*   < > 306* 154* 74 122* 198*  BUN 58*   < > 58* 55* 41* 33* 36*  CREATININE 1.35*   < > 1.32* 1.46* 1.18 1.08 1.27*  CALCIUM 8.4*   < > 8.5* 8.6* 8.7* 8.4* 8.6*  MG 2.3   < > 2.3 2.3 2.1 2.0 2.4  PHOS 3.6  --  3.5 3.2 3.1 2.9  --    < > = values in this interval not displayed.    Studies: No results found.  Scheduled Meds: . vitamin C  500 mg Oral Daily  . aspirin EC  81 mg Oral Daily  . atorvastatin  80 mg Oral q1800  . baricitinib  4 mg Oral Daily  . enoxaparin (LOVENOX) injection  40 mg Subcutaneous Q24H  . insulin aspart  0-15 Units Subcutaneous TID WC  . insulin aspart  0-5 Units Subcutaneous QHS  . insulin detemir  10 Units Subcutaneous BID  . Ipratropium-Albuterol  1 puff Inhalation TID  . methylPREDNISolone (SOLU-MEDROL) injection  80 mg Intravenous BID  . metoprolol succinate  25 mg Oral BID  . multivitamin with minerals  1 tablet Oral Daily  . zinc sulfate  220 mg Oral Daily   Continuous Infusions: PRN Meds: acetaminophen, chlorpheniramine-HYDROcodone, guaiFENesin-dextromethorphan, lip balm, ondansetron **OR** ondansetron (ZOFRAN) IV  Time spent: 35 minutes  Author: Berle Mull, MD [facility] 11/20/2019 8:43  PM  To reach On-call, see care teams to locate the attending and reach out via [email or web address]. Between 7PM-7AM, please contact night-coverage If you still have difficulty reaching the attending provider, please page the [facility] (Director on Call) for [facility] on amion for assistance.

## 2019-11-20 NOTE — Progress Notes (Signed)
Inpatient Diabetes Program Recommendations  AACE/ADA: New Consensus Statement on Inpatient Glycemic Control (2015)  Target Ranges:  Prepandial:   less than 140 mg/dL      Peak postprandial:   less than 180 mg/dL (1-2 hours)      Critically ill patients:  140 - 180 mg/dL   Lab Results  Component Value Date   GLUCAP 216 (H) 11/20/2019   HGBA1C 7.7 (H) 11/12/2019    Review of Glycemic Control  Diabetes history: DM 2 Outpatient Diabetes medications: metformin 027 mg bid, Trulicity 1.5 mg weekly Current orders for Inpatient glycemic control:  Levemir 10 units bid Novolog 0-15 units tid  Solumedrol 80 mg Q12 hours started yesterday  Inpatient Diabetes Program Recommendations:    -  Add Tradjenta 5 mg Daily -  Increase Levemir to 14 units bid -  Add Novolog hs scale    Thanks,  Tama Headings RN, MSN, BC-ADM Inpatient Diabetes Coordinator Team Pager [phone] (8a-5p)

## 2019-11-21 DIAGNOSIS — J9601 Acute respiratory failure with hypoxia: Secondary | ICD-10-CM | POA: Diagnosis not present

## 2019-11-21 DIAGNOSIS — U071 COVID-19: Secondary | ICD-10-CM | POA: Diagnosis not present

## 2019-11-21 LAB — CBC WITH DIFFERENTIAL/PLATELET
Abs Immature Granulocytes: 0.05 10*3/uL (ref 0.00–0.07)
Basophils Absolute: 0 10*3/uL (ref 0.0–0.1)
Basophils Relative: 0 %
Eosinophils Absolute: 0 10*3/uL (ref 0.0–0.5)
Eosinophils Relative: 0 %
HCT: 38.3 % — ABNORMAL LOW (ref 39.0–52.0)
Hemoglobin: 12.9 g/dL — ABNORMAL LOW (ref 13.0–17.0)
Immature Granulocytes: 0 %
Lymphocytes Relative: 3 %
Lymphs Abs: 0.4 10*3/uL — ABNORMAL LOW (ref 0.7–4.0)
MCH: 29.7 pg (ref 26.0–34.0)
MCHC: 33.7 g/dL (ref 30.0–36.0)
MCV: 88.2 fL (ref 80.0–100.0)
Monocytes Absolute: 0.4 10*3/uL (ref 0.1–1.0)
Monocytes Relative: 3 %
Neutro Abs: 12 10*3/uL — ABNORMAL HIGH (ref 1.7–7.7)
Neutrophils Relative %: 94 %
Platelets: 383 10*3/uL (ref 150–400)
RBC: 4.34 MIL/uL (ref 4.22–5.81)
RDW: 13 % (ref 11.5–15.5)
WBC: 12.8 10*3/uL — ABNORMAL HIGH (ref 4.0–10.5)
nRBC: 0 % (ref 0.0–0.2)

## 2019-11-21 LAB — D-DIMER, QUANTITATIVE: D-Dimer, Quant: 1.08 ug/mL-FEU — ABNORMAL HIGH (ref 0.00–0.50)

## 2019-11-21 LAB — COMPREHENSIVE METABOLIC PANEL
ALT: 34 U/L (ref 0–44)
AST: 22 U/L (ref 15–41)
Albumin: 2.8 g/dL — ABNORMAL LOW (ref 3.5–5.0)
Alkaline Phosphatase: 52 U/L (ref 38–126)
Anion gap: 8 (ref 5–15)
BUN: 52 mg/dL — ABNORMAL HIGH (ref 8–23)
CO2: 24 mmol/L (ref 22–32)
Calcium: 8.5 mg/dL — ABNORMAL LOW (ref 8.9–10.3)
Chloride: 106 mmol/L (ref 98–111)
Creatinine, Ser: 1.39 mg/dL — ABNORMAL HIGH (ref 0.61–1.24)
GFR calc non Af Amer: 51 mL/min — ABNORMAL LOW (ref >60)
Glucose, Bld: 240 mg/dL — ABNORMAL HIGH (ref 70–99)
Potassium: 4.7 mmol/L (ref 3.5–5.1)
Sodium: 138 mmol/L (ref 135–145)
Total Bilirubin: 1.3 mg/dL — ABNORMAL HIGH (ref 0.3–1.2)
Total Protein: 6.2 g/dL — ABNORMAL LOW (ref 6.5–8.1)

## 2019-11-21 LAB — GLUCOSE, CAPILLARY
Glucose-Capillary: 229 mg/dL — ABNORMAL HIGH (ref 70–99)
Glucose-Capillary: 231 mg/dL — ABNORMAL HIGH (ref 70–99)
Glucose-Capillary: 301 mg/dL — ABNORMAL HIGH (ref 70–99)
Glucose-Capillary: 325 mg/dL — ABNORMAL HIGH (ref 70–99)
Glucose-Capillary: 341 mg/dL — ABNORMAL HIGH (ref 70–99)

## 2019-11-21 LAB — C-REACTIVE PROTEIN: CRP: 3.7 mg/dL — ABNORMAL HIGH (ref <1.0)

## 2019-11-21 LAB — MAGNESIUM: Magnesium: 2.5 mg/dL — ABNORMAL HIGH (ref 1.7–2.4)

## 2019-11-21 MED ORDER — INSULIN ASPART 100 UNIT/ML ~~LOC~~ SOLN: 8.0000 [IU] | Freq: Three times a day (TID) | SUBCUTANEOUS | Status: DC

## 2019-11-21 MED ORDER — SENNOSIDES-DOCUSATE SODIUM 8.6-50 MG PO TABS
1.0000 | ORAL_TABLET | Freq: Two times a day (BID) | ORAL | Status: DC
  Administered 2019-11-21 – 2019-11-24 (×6): 1 via ORAL
  Filled 2019-11-21 (×7): qty 1

## 2019-11-21 MED ORDER — INSULIN DETEMIR 100 UNIT/ML ~~LOC~~ SOLN
15.0000 [IU] | Freq: Two times a day (BID) | SUBCUTANEOUS | Status: DC
  Administered 2019-11-21 – 2019-11-24 (×7): 15 [IU] via SUBCUTANEOUS
  Filled 2019-11-21 (×7): qty 0.15

## 2019-11-21 MED ORDER — BARICITINIB 2 MG PO TABS
2.0000 mg | ORAL_TABLET | Freq: Every day | ORAL | Status: DC
  Administered 2019-11-22 – 2019-11-24 (×3): 2 mg via ORAL
  Filled 2019-11-21 (×3): qty 1

## 2019-11-21 NOTE — Evaluation (Signed)
Occupational Therapy Evaluation Patient Details Name: Taylor [name] MRN: [id number] DOB: 1950-10-28 Today's Date: 11/21/2019    History of Present Illness TAREQ DWAN is a 69 y.o.WM PMHx CAD s/p CABG in 2019, ischemic cardiomyopathy with EF of 40-45%/diastolic CHF grade 2, HTN, nephrolithiasis, DM type II trolled with complication, HLD.[facility].  Went to [place] infusion clinic 11/12/19, during infusion became hypoxic to 86% on RA.  Put on 4L with improvement in sat and sent to ED.   Clinical Impression   Mr. Blake Vetrano is a 69 year old man normally independent and works at [employer] 40 hrs a week who is currently admitted to hospital with COVID pneumonia. ON evaluation patient demonstrates normal rom and strength of upper extremities but decreased activity tolerance and impaired cardiopulmonary status. Patient supine when therapist entered the room with o2 sat at 90% on 10 L HFNC. Patient's sat dropped to 86% with transfer to edge of bed and to 79% after donning socks. Therapist instructed patient on diaphragmatic breathing techniques and encouraged improved posture to assist with oxygen recovery. Patient able to ambulate 60 feet in room on 10L with sats dropping to 76%. Patient recovered to 88% in three minutes. Patient educated on use of breathing techniques, breathing devices, prone positioning and mobility for recovery. Patient verbalized understanding. Patient will benefit from skilled OT services to improve activity tolerance and cardiopulmonary status  to reduce oxygenation needs in order for patient to return home at discharge.    Follow Up Recommendations  No OT follow up    Equipment Recommendations  None recommended by OT    Recommendations for Other Services       Precautions / Restrictions Precautions Precautions: None Precaution Comments: monitor o2 sats, no complaints of shorntess of breath Restrictions Weight Bearing  Restrictions: No      Mobility Bed Mobility Overal bed mobility: Independent                Transfers Overall transfer level: Needs assistance Equipment used: None Transfers: Sit to/from Stand Sit to Stand: Supervision         General transfer comment: Supervision for safety and to monitor symptoms and o2 sats. One LOB patient able to recover by placing hand on foot board.    Balance Overall balance assessment: Mild deficits observed, not formally tested                                         ADL either performed or assessed with clinical judgement   ADL Overall ADL's : Needs assistance/impaired Eating/Feeding: Independent   Grooming: Independent   Upper Body Bathing: Set up;Sitting   Lower Body Bathing: Set up;Supervison/ safety;Sit to/from stand   Upper Body Dressing : Set up;Sitting   Lower Body Dressing: Set up;Supervision/safety;Sit to/from stand Lower Body Dressing Details (indicate cue type and reason): Patient able to donn socks - o2 sat dropped to 79% Toilet Transfer: Min guard;Ambulation;Regular Toilet   Toileting- Water quality scientist and Hygiene: Supervision/safety;Sit to/from stand       Functional mobility during ADLs: Min guard       Vision Baseline Vision/History: Wears glasses       Perception     Praxis      Pertinent Vitals/Pain Pain Assessment: No/denies pain     Hand Dominance Right   Extremity/Trunk Assessment Upper Extremity Assessment Upper Extremity Assessment:  RUE deficits/detail;LUE deficits/detail RUE Deficits / Details: WNL ROM 5/5 strength LUE Deficits / Details: WNL ROM, 5/5  strength           Communication Communication Communication: No difficulties   Cognition Arousal/Alertness: Awake/alert Behavior During Therapy: WFL for tasks assessed/performed Overall Cognitive Status: Within Functional Limits for tasks assessed                                     General  Comments       Exercises     Shoulder Instructions      Home Living Family/patient expects to be discharged to:: Private residence Living Arrangements: Spouse/significant other Available Help at Discharge: Family Type of Home: House Home Access: Stairs to enter Technical brewer of Steps: 3   Home Layout: One level     Bathroom Shower/Tub: Occupational psychologist: [place]: Shower seat          Prior Functioning/Environment Level of Independence: Independent                 OT Problem List: Decreased activity tolerance;Cardiopulmonary status limiting activity      OT Treatment/Interventions: Self-care/ADL training;Therapeutic exercise;DME and/or AE instruction;Energy conservation;Therapeutic activities;Patient/family education    OT Goals(Current goals can be found in the care plan section) Acute Rehab OT Goals Patient Stated Goal: to go home OT Goal Formulation: With patient Time For Goal Achievement: 12/05/19 Potential to Achieve Goals: Good  OT Frequency: Min 2X/week   Barriers to D/C:            Co-evaluation              AM-PAC OT "6 Clicks" Daily Activity     Outcome Measure Help from another person eating meals?: None Help from another person taking care of personal grooming?: None Help from another person toileting, which includes using toliet, bedpan, or urinal?: A Little Help from another person bathing (including washing, rinsing, drying)?: A Little Help from another person to put on and taking off regular upper body clothing?: None Help from another person to put on and taking off regular lower body clothing?: A Little 6 Click Score: 21   End of Session Equipment Utilized During Treatment: Oxygen Nurse Communication:  (okay to see per RN)  Activity Tolerance: Patient tolerated treatment well Patient left: in bed;with call bell/phone within reach                   Time: 1015-1040 OT Time  Calculation (min): 25 min Charges:  OT General Charges $OT Visit: 1 Visit OT Evaluation $OT Eval Low Complexity: 1 Low OT Treatments $Therapeutic Activity: 8-22 mins  Jesalyn Finazzo, OTR/L [place]  Office [phone] Pager: [phone]   Lenward Chancellor 11/21/2019, 10:56 AM

## 2019-11-21 NOTE — Progress Notes (Signed)
Inpatient Diabetes Program Recommendations  AACE/ADA: New Consensus Statement on Inpatient Glycemic Control (2015)  Target Ranges:  Prepandial:   less than 140 mg/dL      Peak postprandial:   less than 180 mg/dL (1-2 hours)      Critically ill patients:  140 - 180 mg/dL   Lab Results  Component Value Date   GLUCAP 229 (H) 11/21/2019   HGBA1C 7.7 (H) 11/12/2019    Review of Glycemic Control  Diabetes history: DM 2 Outpatient Diabetes medications: metformin 833 mg bid, Trulicity 1.5 mg weekly Current orders for Inpatient glycemic control:  Levemir 15 units bid Novolog 0-15 units tid + hs  Solumedrol 80 mg Q12 hours started yesterday  Inpatient Diabetes Program Recommendations:    Note: To get Levemir 15 units bid today.  -  Add Tradjenta 5 mg Daily (shown to reduce mortality in COVID pts)   Thanks,  Tama Headings RN, MSN, BC-ADM Inpatient Diabetes Coordinator Team Pager [phone] (8a-5p)

## 2019-11-21 NOTE — Progress Notes (Signed)
[facility] Progress Note  Patient: Taylor [name]    YOV:[id number]  DOA: 11/12/2019     Date of Service: the patient was seen and examined on 11/21/2019  Brief hospital course: Past medical history of CAD SP CABG, ischemic cardiomyopathy, HTN, type II DM, HLD. Presents with complaints of cough and shortness of breath. Currently plan is continue current care to support hypoxia  Assessment and Plan: 1. Acute COVID-19 Viral Pneumonia Acute hypoxic respiratory failure CXR: hazy bilateral peripheral opacities Oxygen requirement: On 10 LPM from 15 LPM yesterday. CRP: 12.7-1.4-2.6-7.0-9.2-3.7 Remdesivir: Completed the treatment Steroids: On oral prednisone. Now back on IV Solu-Medrol Baricitinib/Actemra(off-label use): Currently on 4 mg daily dose. As renal function improved. The investigational nature of this medication was discussed with the patient/HCPOA and they choose to proceed as the potential benefits are felt to outweigh risks at this time.  Antibiotics: Not indicated Vitamin C and Zinc: Continue DVT Prophylaxis: enoxaparin (LOVENOX) injection 40 mg Start: 11/12/19 2200 Prone positioning and incentive spirometer use recommended.  The treatment plan and use of medications and known side effects were discussed with patient/family. It was clearly explained that Complete risks and long-term side effects are unknown, however in the best clinical judgment they seem to be of some clinical benefit rather than medical risks. Patient/family agree with the treatment plan and want to receive these treatments as indicated.   2. Essential hypertension. Ischemic cardiomyopathy. CAD. Continue Toprol, as needed Lasix. Echocardiogram shows improvement in EF.  3. Type 2 diabetes mellitus, uncontrolled with hyperglycemia with hyperlipidemia and renal dysfunction Continue sliding scale insulin. Discontinue scheduled premeal coverage given hypoglycemia. Continue Levemir as the patient will be  on steroids.  4.  Acute kidney injury on CKD 3 a Renal function now stable. Continue to monitor.  5. Hypokalemia Continue to replace. Monitor.  Diet: Cardiac diet carb modified DVT Prophylaxis:   enoxaparin (LOVENOX) injection 40 mg Start: 11/12/19 2200  Advance goals of care discussion: Full code  Family Communication: NO family was present at bedside, at the time of interview.   Disposition:  Status is: Inpatient  Remains inpatient appropriate because:Hemodynamically unstable  Dispo: The patient is from: Home              Anticipated d/c is to: Home              Anticipated d/c date is: > 3 days              Patient currently is not medically stable to d/c.  Subjective: No nausea no vomiting.  No fever no chills.  No acute complaint.  Physical Exam:  General: Appear in mild distress, no Rash; Oral Mucosa Clear, moist. no Abnormal Neck Mass Or lumps, Conjunctiva normal  Cardiovascular: S1 and S2 Present, no Murmur, Respiratory: increased respiratory effort, Bilateral Air entry present and bilateral  Crackles, no wheezes Abdomen: Bowel Sound present, Soft and no tenderness Extremities: no Pedal edema Neurology: alert and oriented to time, place, and person affect appropriate. no new focal deficit Gait not checked due to patient safety concerns   Vitals:   11/21/19 0317 11/21/19 0356 11/21/19 0958 11/21/19 1457  BP:  136/72  132/75  Pulse: (!) 58 67  83  Resp:  15 18 20   Temp:  97.9 F (36.6 C)  (!) 97.3 F (36.3 C)  TempSrc:  Oral  Oral  SpO2: 94% 91% 91% 92%  Weight:  88.1 kg    Height:        Intake/Output  Summary (Last 24 hours) at 11/21/2019 1858 Last data filed at 11/21/2019 1700 Gross per 24 hour  Intake 480 ml  Output 1300 ml  Net -820 ml   Filed Weights   11/19/19 0353 11/20/19 0500 11/21/19 0356  Weight: 81.6 kg 86.7 kg 88.1 kg    Data Reviewed: I have personally reviewed and interpreted daily labs, tele strips, imagings as discussed above. I  reviewed all nursing notes, pharmacy notes, vitals, pertinent old records I have discussed plan of care as described above with RN and patient/family.  CBC: Recent Labs  Lab 11/17/19 0500 11/18/19 0515 11/19/19 0442 11/20/19 0402 11/21/19 0427  WBC 10.6* 12.3* 9.7 5.9 12.8*  NEUTROABS 9.3* 10.7* 8.7* 5.3 12.0*  HGB 12.1* 13.4 12.7* 13.4 12.9*  HCT 35.9* 39.9 37.4* 39.6 38.3*  MCV 86.5 88.3 87.4 88.2 88.2  PLT 265 339 328 370 825   Basic Metabolic Panel: Recent Labs  Lab 11/15/19 0939 11/15/19 0939 11/16/19 0531 11/16/19 0531 11/17/19 0500 11/18/19 0515 11/19/19 0442 11/20/19 0402 11/21/19 0427  NA 138   < > 141   < > 143 143 135 138 138  K 3.7   < > 3.9   < > 3.4* 3.4* 3.8 4.2 4.7  CL 106   < > 108   < > 108 108 106 105 106  CO2 21*   < > 22   < > 25 24 24 24 24   GLUCOSE 424*   < > 306*   < > 154* 74 122* 198* 240*  BUN 58*   < > 58*   < > 55* 41* 33* 36* 52*  CREATININE 1.35*   < > 1.32*   < > 1.46* 1.18 1.08 1.27* 1.39*  CALCIUM 8.4*   < > 8.5*   < > 8.6* 8.7* 8.4* 8.6* 8.5*  MG 2.3   < > 2.3   < > 2.3 2.1 2.0 2.4 2.5*  PHOS 3.6  --  3.5  --  3.2 3.1 2.9  --   --    < > = values in this interval not displayed.    Studies: No results found.  Scheduled Meds: . vitamin C  500 mg Oral Daily  . aspirin EC  81 mg Oral Daily  . atorvastatin  80 mg Oral q1800  . [START ON 11/22/2019] baricitinib  2 mg Oral Daily  . enoxaparin (LOVENOX) injection  40 mg Subcutaneous Q24H  . insulin aspart  0-15 Units Subcutaneous TID WC  . insulin aspart  0-5 Units Subcutaneous QHS  . insulin detemir  15 Units Subcutaneous BID  . Ipratropium-Albuterol  1 puff Inhalation TID  . methylPREDNISolone (SOLU-MEDROL) injection  80 mg Intravenous BID  . metoprolol succinate  25 mg Oral BID  . multivitamin with minerals  1 tablet Oral Daily  . senna-docusate  1 tablet Oral BID  . zinc sulfate  220 mg Oral Daily   Continuous Infusions: PRN Meds: acetaminophen, chlorpheniramine-HYDROcodone,  guaiFENesin-dextromethorphan, lip balm, ondansetron **OR** ondansetron (ZOFRAN) IV  Time spent: 35 minutes  Author: Berle Mull, MD [facility] 11/21/2019 6:58 PM  To reach On-call, see care teams to locate the attending and reach out via [email or web address]. Between 7PM-7AM, please contact night-coverage If you still have difficulty reaching the attending provider, please page the [facility] (Director on Call) for [facility] on amion for assistance.

## 2019-11-22 DIAGNOSIS — U071 COVID-19: Secondary | ICD-10-CM | POA: Diagnosis not present

## 2019-11-22 DIAGNOSIS — J9601 Acute respiratory failure with hypoxia: Secondary | ICD-10-CM | POA: Diagnosis not present

## 2019-11-22 LAB — GLUCOSE, CAPILLARY
Glucose-Capillary: 225 mg/dL — ABNORMAL HIGH (ref 70–99)
Glucose-Capillary: 316 mg/dL — ABNORMAL HIGH (ref 70–99)
Glucose-Capillary: 295 mg/dL — ABNORMAL HIGH (ref 70–99)
Glucose-Capillary: 242 mg/dL — ABNORMAL HIGH (ref 70–99)

## 2019-11-22 LAB — BASIC METABOLIC PANEL
Anion gap: 8 (ref 5–15)
BUN: 54 mg/dL — ABNORMAL HIGH (ref 8–23)
CO2: 23 mmol/L (ref 22–32)
Calcium: 8.3 mg/dL — ABNORMAL LOW (ref 8.9–10.3)
Chloride: 106 mmol/L (ref 98–111)
Creatinine, Ser: 1.34 mg/dL — ABNORMAL HIGH (ref 0.61–1.24)
GFR, Estimated: 54 mL/min — ABNORMAL LOW (ref >60)
Glucose, Bld: 209 mg/dL — ABNORMAL HIGH (ref 70–99)
Potassium: 4.7 mmol/L (ref 3.5–5.1)
Sodium: 137 mmol/L (ref 135–145)

## 2019-11-22 LAB — MAGNESIUM: Magnesium: 2.4 mg/dL (ref 1.7–2.4)

## 2019-11-22 MED ORDER — METHYLPREDNISOLONE SODIUM SUCC 40 MG IJ SOLR
40.0000 mg | Freq: Two times a day (BID) | INTRAMUSCULAR | Status: DC
  Administered 2019-11-22 – 2019-11-24 (×4): 40 mg via INTRAVENOUS
  Filled 2019-11-22 (×4): qty 1

## 2019-11-22 NOTE — Progress Notes (Signed)
Patient's blood glucose levels has been elevated. Patient does has snack from home at the bedside. I spoke to him about the importance of monitoring his intake.

## 2019-11-22 NOTE — Progress Notes (Signed)
Physical Therapy Treatment Patient Details Name: Taylor [name] MRN: [id number] DOB: Aug 06, 1950 Today's Date: 11/22/2019    History of Present Illness Taylor [name] is a 69 y.o.WM PMHx CAD s/p CABG in 2019, ischemic cardiomyopathy with EF of 40-45%/diastolic CHF grade 2, HTN, nephrolithiasis, DM type II trolled with complication, HLD.Tested positive for COVID at [facility] on 11/06/19.  Went to [facility] infusion clinic 11/12/19, during infusion became hypoxic to 86% on RA.  Put on 4L with improvement in sat and sent to ED.    PT Comments    Pt ambulated in hallway and saturation dropped on 6L HFNC.  General Gait Details: occasional slight off balance but no physical assist required; ambulated on 6L O2 HFNC and Spo2 dropped to 70% requiring 4 minutes to improve to 79%; changed to pulse ox on ear lobe and SPO2 dropped to 75% on 6L HFNC and 3 minutes to recover to 85%  Pt hopeful for d/c home soon and very motivated to mobilize.  Pt encouraged to ambulate again with staff later today.    Follow Up Recommendations  No PT follow up     Equipment Recommendations  None recommended by PT    Recommendations for Other Services       Precautions / Restrictions Precautions Precautions: None Precaution Comments: monitor o2 sats    Mobility  Bed Mobility Overal bed mobility: Modified Independent;Independent                Transfers Overall transfer level: Needs assistance Equipment used: None Transfers: Sit to/from Stand Sit to Stand: Supervision         General transfer comment: Supervision for safety  Ambulation/Gait Ambulation/Gait assistance: Min guard;Supervision Gait Distance (Feet): 120 Feet (x2) Assistive device: None Gait Pattern/deviations: Step-through pattern;Drifts right/left     General Gait Details: occasional slight off balance but no physical assist required; ambulated on 6L O2 HFNC and Spo2 dropped to 70% requiring 4 minutes to improve to 79%; changed to pulse ox  on ear lobe and SPO2 dropped to 75% on 6L HFNC and 3 minutes to recover to 85%   Stairs             Wheelchair Mobility    Modified Rankin (Stroke Patients Only)       Balance                                            Cognition Arousal/Alertness: Awake/alert Behavior During Therapy: WFL for tasks assessed/performed Overall Cognitive Status: Within Functional Limits for tasks assessed                                        Exercises      General Comments        Pertinent Vitals/Pain Pain Assessment: No/denies pain    Home Living                      Prior Function            PT Goals (current goals can now be found in the care plan section) Progress towards PT goals: Progressing toward goals    Frequency    Min 3X/week      PT Plan Current plan remains appropriate    Co-evaluation  AM-PAC PT "6 Clicks" Mobility   Outcome Measure  Help needed turning from your back to your side while in a flat bed without using bedrails?: None Help needed moving from lying on your back to sitting on the side of a flat bed without using bedrails?: None Help needed moving to and from a bed to a chair (including a wheelchair)?: A Little Help needed standing up from a chair using your arms (e.g., wheelchair or bedside chair)?: A Little Help needed to walk in hospital room?: A Little Help needed climbing 3-5 steps with a railing? : A Little 6 Click Score: 20    End of Session Equipment Utilized During Treatment: Oxygen Activity Tolerance: Patient tolerated treatment well Patient left: in bed;with call bell/phone within reach Nurse Communication: Mobility status PT Visit Diagnosis: Difficulty in walking, not elsewhere classified (R26.2)     Time: 1856-3149 PT Time Calculation (min) (ACUTE ONLY): 23 min  Charges:  $Gait Training: 23-37 mins                    Jannette Spanner PT, DPT Acute Rehabilitation  Services Pager: [phone] Office: [phone]  York Ram E 11/22/2019, 2:55 PM

## 2019-11-22 NOTE — Progress Notes (Signed)
[facility] Progress Note  Patient: Taylor [name]    ION:[id number]  DOA: 11/12/2019     Date of Service: the patient was seen and examined on 11/22/2019  Brief hospital course: Past medical history of CAD SP CABG, ischemic cardiomyopathy, HTN, type II DM, HLD. Presents with complaints of cough and shortness of breath. Currently plan is continue current care to support hypoxia  Assessment and Plan: 1. Acute COVID-19 Viral Pneumonia Acute hypoxic respiratory failure CXR: hazy bilateral peripheral opacities Oxygen requirement: On 6 LPM now. CRP: 12.7-1.4-2.6-7.0-9.2-3.7 Remdesivir: Completed the treatment Steroids: Was on oral prednisone. Now back on IV Solu-Medrol.  Now tapering. Baricitinib/Actemra(off-label use): Currently on.  Pharmacy adjusting dosage based on renal function. The investigational nature of this medication was discussed with the patient/HCPOA and they choose to proceed as the potential benefits are felt to outweigh risks at this time.  Antibiotics: Not indicated Vitamin C and Zinc: Continue DVT Prophylaxis: enoxaparin (LOVENOX) injection 40 mg Start: 11/12/19 2200 Prone positioning and incentive spirometer use recommended.  The treatment plan and use of medications and known side effects were discussed with patient/family. It was clearly explained that Complete risks and long-term side effects are unknown, however in the best clinical judgment they seem to be of some clinical benefit rather than medical risks. Patient/family agree with the treatment plan and want to receive these treatments as indicated.   2. Essential hypertension. Ischemic cardiomyopathy. CAD. Continue Toprol, as needed Lasix. Echocardiogram shows improvement in EF.  3. Type 2 diabetes mellitus, uncontrolled with hyperglycemia with hyperlipidemia and renal dysfunction Continue sliding scale insulin. Continue Levemir as the patient will be on steroids.  4.  Acute kidney injury on CKD 3  a Renal function again Mildly worsening. Continue to monitor.  5. Hypokalemia Continue to replace. Monitor.  Diet: Cardiac diet carb modified DVT Prophylaxis:   enoxaparin (LOVENOX) injection 40 mg Start: 11/12/19 2200  Advance goals of care discussion: Full code  Family Communication: NO family was present at bedside, at the time of interview.  Discussed with wife on phone.  Disposition:  Status is: Inpatient  Remains inpatient appropriate because:Hemodynamically unstable  Dispo: The patient is from: Home              Anticipated d/c is to: Home              Anticipated d/c date is: 1 to 2 days depending on progression              Patient currently is not medically stable to d/c.  Subjective: Continues to have shortness of breath but improving.  No nausea no vomiting.  Cough is improving.  Oral intake is improving.  Physical Exam:  General: Appear in mild distress, no Rash; Oral Mucosa Clear, moist. no Abnormal Neck Mass Or lumps, Conjunctiva normal  Cardiovascular: S1 and S2 Present, no Murmur, Respiratory: increased respiratory effort, Bilateral Air entry present and bilateral  Crackles, no wheezes Abdomen: Bowel Sound present, Soft and no tenderness Extremities: no Pedal edema Neurology: alert and oriented to time, place, and person affect appropriate. no new focal deficit Gait not checked due to patient safety concerns  Vitals:   11/22/19 0600 11/22/19 0800 11/22/19 1300 11/22/19 1356  BP:    134/74  Pulse:    70  Resp:  20  19  Temp:    97.8 F (36.6 C)  TempSrc:    Oral  SpO2: 96% 93% 94% 91%  Weight:      Height:  Intake/Output Summary (Last 24 hours) at 11/22/2019 1930 Last data filed at 11/22/2019 1700 Gross per 24 hour  Intake 1470 ml  Output 1600 ml  Net -130 ml   Filed Weights   11/20/19 0500 11/21/19 0356 11/22/19 0442  Weight: 86.7 kg 88.1 kg 88.1 kg    Data Reviewed: I have personally reviewed and interpreted daily labs, tele  strips, imagings as discussed above. I reviewed all nursing notes, pharmacy notes, vitals, pertinent old records I have discussed plan of care as described above with RN and patient/family.  CBC: Recent Labs  Lab 11/17/19 0500 11/18/19 0515 11/19/19 0442 11/20/19 0402 11/21/19 0427  WBC 10.6* 12.3* 9.7 5.9 12.8*  NEUTROABS 9.3* 10.7* 8.7* 5.3 12.0*  HGB 12.1* 13.4 12.7* 13.4 12.9*  HCT 35.9* 39.9 37.4* 39.6 38.3*  MCV 86.5 88.3 87.4 88.2 88.2  PLT 265 339 328 370 585   Basic Metabolic Panel: Recent Labs  Lab 11/16/19 0531 11/16/19 0531 11/17/19 0500 11/17/19 0500 11/18/19 0515 11/19/19 0442 11/20/19 0402 11/21/19 0427 11/22/19 0440  NA 141   < > 143   < > 143 135 138 138 137  K 3.9   < > 3.4*   < > 3.4* 3.8 4.2 4.7 4.7  CL 108   < > 108   < > 108 106 105 106 106  CO2 22   < > 25   < > 24 24 24 24 23   GLUCOSE 306*   < > 154*   < > 74 122* 198* 240* 209*  BUN 58*   < > 55*   < > 41* 33* 36* 52* 54*  CREATININE 1.32*   < > 1.46*   < > 1.18 1.08 1.27* 1.39* 1.34*  CALCIUM 8.5*   < > 8.6*   < > 8.7* 8.4* 8.6* 8.5* 8.3*  MG 2.3   < > 2.3   < > 2.1 2.0 2.4 2.5* 2.4  PHOS 3.5  --  3.2  --  3.1 2.9  --   --   --    < > = values in this interval not displayed.    Studies: No results found.  Scheduled Meds: . vitamin C  500 mg Oral Daily  . aspirin EC  81 mg Oral Daily  . atorvastatin  80 mg Oral q1800  . baricitinib  2 mg Oral Daily  . enoxaparin (LOVENOX) injection  40 mg Subcutaneous Q24H  . insulin aspart  0-15 Units Subcutaneous TID WC  . insulin aspart  0-5 Units Subcutaneous QHS  . insulin detemir  15 Units Subcutaneous BID  . Ipratropium-Albuterol  1 puff Inhalation TID  . methylPREDNISolone (SOLU-MEDROL) injection  40 mg Intravenous BID  . metoprolol succinate  25 mg Oral BID  . multivitamin with minerals  1 tablet Oral Daily  . senna-docusate  1 tablet Oral BID  . zinc sulfate  220 mg Oral Daily   Continuous Infusions: PRN Meds: acetaminophen,  chlorpheniramine-HYDROcodone, guaiFENesin-dextromethorphan, lip balm, ondansetron **OR** ondansetron (ZOFRAN) IV  Time spent: 35 minutes  Author: Berle Mull, MD [facility] 11/22/2019 7:30 PM  To reach On-call, see care teams to locate the attending and reach out via [email or web address]. Between 7PM-7AM, please contact night-coverage If you still have difficulty reaching the attending provider, please page the [facility] (Director on Call) for [facility] on amion for assistance.

## 2019-11-23 ENCOUNTER — Inpatient Hospital Stay (HOSPITAL_COMMUNITY): Payer: PPO

## 2019-11-23 DIAGNOSIS — U071 COVID-19: Secondary | ICD-10-CM | POA: Diagnosis not present

## 2019-11-23 DIAGNOSIS — J9601 Acute respiratory failure with hypoxia: Secondary | ICD-10-CM | POA: Diagnosis not present

## 2019-11-23 LAB — COMPREHENSIVE METABOLIC PANEL
ALT: 40 U/L (ref 0–44)
AST: 24 U/L (ref 15–41)
Albumin: 2.9 g/dL — ABNORMAL LOW (ref 3.5–5.0)
Alkaline Phosphatase: 60 U/L (ref 38–126)
Anion gap: 7 (ref 5–15)
BUN: 47 mg/dL — ABNORMAL HIGH (ref 8–23)
CO2: 24 mmol/L (ref 22–32)
Calcium: 8.4 mg/dL — ABNORMAL LOW (ref 8.9–10.3)
Chloride: 106 mmol/L (ref 98–111)
Creatinine, Ser: 1.13 mg/dL (ref 0.61–1.24)
GFR, Estimated: >60 mL/min (ref >60)
Glucose, Bld: 216 mg/dL — ABNORMAL HIGH (ref 70–99)
Potassium: 4.7 mmol/L (ref 3.5–5.1)
Sodium: 137 mmol/L (ref 135–145)
Total Bilirubin: 1 mg/dL (ref 0.3–1.2)
Total Protein: 6.1 g/dL — ABNORMAL LOW (ref 6.5–8.1)

## 2019-11-23 LAB — GLUCOSE, CAPILLARY
Glucose-Capillary: 172 mg/dL — ABNORMAL HIGH (ref 70–99)
Glucose-Capillary: 243 mg/dL — ABNORMAL HIGH (ref 70–99)
Glucose-Capillary: 313 mg/dL — ABNORMAL HIGH (ref 70–99)
Glucose-Capillary: 266 mg/dL — ABNORMAL HIGH (ref 70–99)

## 2019-11-23 LAB — CBC
HCT: 38.7 % — ABNORMAL LOW (ref 39.0–52.0)
Hemoglobin: 12.9 g/dL — ABNORMAL LOW (ref 13.0–17.0)
MCH: 29.5 pg (ref 26.0–34.0)
MCHC: 33.3 g/dL (ref 30.0–36.0)
MCV: 88.6 fL (ref 80.0–100.0)
Platelets: 408 10*3/uL — ABNORMAL HIGH (ref 150–400)
RBC: 4.37 MIL/uL (ref 4.22–5.81)
RDW: 13 % (ref 11.5–15.5)
WBC: 13.6 10*3/uL — ABNORMAL HIGH (ref 4.0–10.5)
nRBC: 0 % (ref 0.0–0.2)

## 2019-11-23 LAB — MAGNESIUM: Magnesium: 2.4 mg/dL (ref 1.7–2.4)

## 2019-11-23 LAB — D-DIMER, QUANTITATIVE: D-Dimer, Quant: 1.98 ug/mL-FEU — ABNORMAL HIGH (ref 0.00–0.50)

## 2019-11-23 LAB — C-REACTIVE PROTEIN: CRP: 0.8 mg/dL (ref <1.0)

## 2019-11-23 MED ORDER — IOHEXOL 350 MG/ML SOLN
100.0000 mL | Freq: Once | INTRAVENOUS | Status: AC | PRN
  Administered 2019-11-23: 100 mL via INTRAVENOUS

## 2019-11-23 NOTE — Progress Notes (Signed)
Pt down for CT scan of chest.

## 2019-11-23 NOTE — Progress Notes (Signed)
Pt up in hallway with [place] at 6 L/min and O2 sats low 70s. Pt aware and standing up without SOB. Pt stating he "feels fine". MD present and [employer]. Pts O2 increased to 10 L/min via  and sats only increased to low 80s. Pt put back to bed and HFNC applied back on pt at 4 L/min and pt's O2 sats increased to low 90s.  CT scan of chest ordered by MD.

## 2019-11-23 NOTE — Progress Notes (Signed)
[facility] Progress Note  Patient: Taylor [name]    GBT:[id number]  DOA: 11/12/2019     Date of Service: the patient was seen and examined on 11/23/2019  Brief hospital course: Past medical history of CAD SP CABG, ischemic cardiomyopathy, HTN, type II DM, HLD. Presents with complaints of cough and shortness of breath.  Found to have COVID-19 pneumonia with severe hypoxia. Currently plan is continue current care to support hypoxia  Assessment and Plan: 1. Acute COVID-19 Viral Pneumonia Acute hypoxic respiratory failure, POA Orthodeoxia CXR: hazy bilateral peripheral opacities CT scan of the chest, negative for pulmonary embolism shows multifocal pneumonia with groundglass opacities. Oxygen requirement: 2 LPM at rest 94%, 10 LPM on exertion 83%. CRP: 12.7-1.4-2.6-7.0-9.2-3.7-0.8 Remdesivir: Completed the treatment Steroids: Was on oral prednisone. Now back on IV Solu-Medrol.  Now tapering. Baricitinib/Actemra(off-label use): Currently on.  Pharmacy adjusting dosage based on renal function. The investigational nature of this medication was discussed with the patient/HCPOA and they choose to proceed as the potential benefits are felt to outweigh risks at this time.  Antibiotics: Not indicated Vitamin C and Zinc: Continue DVT Prophylaxis: enoxaparin (LOVENOX) injection 40 mg Start: 11/12/19 2200 Prone positioning and incentive spirometer use recommended.  Etiology of the patient's severe worsening of hypoxia on exertion is not clear.  Echocardiogram did not show any evidence of shunting.  Does not appear to have significant liver disease history.  CT PE protocol also negative for any pulmonary embolism or any other shunting or significant findings.  For now we will continue with current management for supportive therapy.  The treatment plan and use of medications and known side effects were discussed with patient/family. It was clearly explained that Complete risks and long-term side  effects are unknown, however in the best clinical judgment they seem to be of some clinical benefit rather than medical risks. Patient/family agree with the treatment plan and want to receive these treatments as indicated.   2. Essential hypertension. Ischemic cardiomyopathy. CAD. Continue Toprol, as needed Lasix. Echocardiogram shows improvement in EF.  3. Type 2 diabetes mellitus, uncontrolled with hyperglycemia with hyperlipidemia and renal dysfunction Continue sliding scale insulin. Continue Levemir as the patient will be on steroids.  4.  Acute kidney injury on CKD 3 a Renal function again Mildly worsening. Continue to monitor.  5. Hypokalemia Continue to replace. Monitor.  Diet: Cardiac diet carb modified DVT Prophylaxis:   enoxaparin (LOVENOX) injection 40 mg Start: 11/12/19 2200  Advance goals of care discussion: Full code  Family Communication: NO family was present at bedside, at the time of interview.    Disposition:  Status is: Inpatient  Remains inpatient appropriate because:Hemodynamically unstable  Dispo: The patient is from: Home              Anticipated d/c is to: Home              Anticipated d/c date is: 1 to 2 days depending on progression              Patient currently is not medically stable to d/c.  Subjective: No acute complaint no nausea no vomiting.  No fever no chills.  No chest pain.  Physical Exam:  General: Appear in mild distress, no Rash; Oral Mucosa Clear, moist. no Abnormal Neck Mass Or lumps, Conjunctiva normal  Cardiovascular: S1 and S2 Present, no Murmur, Respiratory: good respiratory effort, Bilateral Air entry present and bilateral Crackles, no wheezes Abdomen: Bowel Sound present, Soft and no tenderness Extremities: no Pedal edema  Neurology: alert and oriented to time, place, and person affect appropriate. no new focal deficit Gait not checked due to patient safety concerns  Vitals:   11/23/19 1000 11/23/19 1337 11/23/19 1600  11/23/19 2016  BP:  138/70  140/78  Pulse:  66  66  Resp:  18  (!) 21  Temp:  97.7 F (36.5 C)  97.7 F (36.5 C)  TempSrc:    Oral  SpO2: 91% 94% 99% 93%  Weight:      Height:        Intake/Output Summary (Last 24 hours) at 11/23/2019 2032 Last data filed at 11/23/2019 1829 Gross per 24 hour  Intake 960 ml  Output 1200 ml  Net -240 ml   Filed Weights   11/21/19 0356 11/22/19 0442 11/23/19 0549  Weight: 88.1 kg 88.1 kg 88.2 kg    Data Reviewed: I have personally reviewed and interpreted daily labs, tele strips, imagings as discussed above. I reviewed all nursing notes, pharmacy notes, vitals, pertinent old records I have discussed plan of care as described above with RN and patient/family.  CBC: Recent Labs  Lab 11/17/19 0500 11/17/19 0500 11/18/19 0515 11/19/19 0442 11/20/19 0402 11/21/19 0427 11/23/19 0437  WBC 10.6*   < > 12.3* 9.7 5.9 12.8* 13.6*  NEUTROABS 9.3*  --  10.7* 8.7* 5.3 12.0*  --   HGB 12.1*   < > 13.4 12.7* 13.4 12.9* 12.9*  HCT 35.9*   < > 39.9 37.4* 39.6 38.3* 38.7*  MCV 86.5   < > 88.3 87.4 88.2 88.2 88.6  PLT 265   < > 339 328 370 383 408*   < > = values in this interval not displayed.   Basic Metabolic Panel: Recent Labs  Lab 11/17/19 0500 11/17/19 0500 11/18/19 0515 11/18/19 0515 11/19/19 0442 11/20/19 0402 11/21/19 0427 11/22/19 0440 11/23/19 0437  NA 143   < > 143   < > 135 138 138 137 137  K 3.4*   < > 3.4*   < > 3.8 4.2 4.7 4.7 4.7  CL 108   < > 108   < > 106 105 106 106 106  CO2 25   < > 24   < > 24 24 24 23 24   GLUCOSE 154*   < > 74   < > 122* 198* 240* 209* 216*  BUN 55*   < > 41*   < > 33* 36* 52* 54* 47*  CREATININE 1.46*   < > 1.18   < > 1.08 1.27* 1.39* 1.34* 1.13  CALCIUM 8.6*   < > 8.7*   < > 8.4* 8.6* 8.5* 8.3* 8.4*  MG 2.3   < > 2.1   < > 2.0 2.4 2.5* 2.4 2.4  PHOS 3.2  --  3.1  --  2.9  --   --   --   --    < > = values in this interval not displayed.    Studies: CT ANGIO CHEST PE W OR WO CONTRAST   Result Date: 11/23/2019 CLINICAL DATA:  History of COVID-19. EXAM: CT ANGIOGRAPHY CHEST WITH CONTRAST TECHNIQUE: Multidetector CT imaging of the chest was performed using the standard protocol during bolus administration of intravenous contrast. Multiplanar CT image reconstructions and MIPs were obtained to evaluate the vascular anatomy. CONTRAST:  186mL OMNIPAQUE IOHEXOL 350 MG/ML SOLN COMPARISON:  Chest radiograph 11/19/2019 FINDINGS: Cardiovascular: Heart is mildly enlarged. Status post median sternotomy. Thoracic aortic vascular calcifications. No pericardial effusion. Adequate opacification the pulmonary  arterial system. Motion artifact limits evaluation. No evidence for acute pulmonary embolus. Mediastinum/Nodes: No enlarged axillary, mediastinal or hilar lymphadenopathy. Normal appearance of the esophagus. Lungs/Pleura: Central airways are patent. Extensive sharply marginated peripheral ground-glass and consolidative opacities throughout the lungs bilaterally. No pleural effusion or pneumothorax. Upper Abdomen: 1.3 cm exophytic dense structure superior pole right kidney (image 77; series 4), potentially representing a hemorrhagic/proteinaceous cyst. This is incompletely characterized. No acute process. Musculoskeletal: Thoracic spine degenerative changes. Prior median sternotomy. No aggressive or acute appearing osseous lesions. Review of the MIP images confirms the above findings. IMPRESSION: 1. No evidence for acute pulmonary embolus. 2. Extensive sharply marginated peripheral ground-glass and consolidative opacities throughout the lungs bilaterally which may represent atypical/viral pneumonia. 3. Aortic atherosclerosis. 4. Incompletely characterized exophytic dense structure superior pole right kidney, potentially a hemorrhagic/proteinaceous cyst. Consider dedicated evaluation in outpatient setting. Electronically Signed   By: Lovey Newcomer M.D.   On: 11/23/2019 14:23    Scheduled Meds: . vitamin C  500  mg Oral Daily  . aspirin EC  81 mg Oral Daily  . atorvastatin  80 mg Oral q1800  . baricitinib  2 mg Oral Daily  . enoxaparin (LOVENOX) injection  40 mg Subcutaneous Q24H  . insulin aspart  0-15 Units Subcutaneous TID WC  . insulin aspart  0-5 Units Subcutaneous QHS  . insulin detemir  15 Units Subcutaneous BID  . Ipratropium-Albuterol  1 puff Inhalation TID  . methylPREDNISolone (SOLU-MEDROL) injection  40 mg Intravenous BID  . metoprolol succinate  25 mg Oral BID  . multivitamin with minerals  1 tablet Oral Daily  . senna-docusate  1 tablet Oral BID  . zinc sulfate  220 mg Oral Daily   Continuous Infusions: PRN Meds: acetaminophen, chlorpheniramine-HYDROcodone, guaiFENesin-dextromethorphan, lip balm, ondansetron **OR** ondansetron (ZOFRAN) IV  Time spent: 35 minutes  Author: Berle Mull, MD [facility] 11/23/2019 8:32 PM  To reach On-call, see care teams to locate the attending and reach out via [email or web address]. Between 7PM-7AM, please contact night-coverage If you still have difficulty reaching the attending provider, please page the [facility] (Director on Call) for [facility] on amion for assistance.

## 2019-11-24 LAB — COMPREHENSIVE METABOLIC PANEL
ALT: 38 U/L (ref 0–44)
AST: 20 U/L (ref 15–41)
Albumin: 2.6 g/dL — ABNORMAL LOW (ref 3.5–5.0)
Alkaline Phosphatase: 55 U/L (ref 38–126)
Anion gap: 8 (ref 5–15)
BUN: 40 mg/dL — ABNORMAL HIGH (ref 8–23)
CO2: 24 mmol/L (ref 22–32)
Calcium: 8.3 mg/dL — ABNORMAL LOW (ref 8.9–10.3)
Chloride: 105 mmol/L (ref 98–111)
Creatinine, Ser: 1.18 mg/dL (ref 0.61–1.24)
GFR, Estimated: >60 mL/min (ref >60)
Glucose, Bld: 199 mg/dL — ABNORMAL HIGH (ref 70–99)
Potassium: 5.4 mmol/L — ABNORMAL HIGH (ref 3.5–5.1)
Sodium: 137 mmol/L (ref 135–145)
Total Bilirubin: 0.9 mg/dL (ref 0.3–1.2)
Total Protein: 5.7 g/dL — ABNORMAL LOW (ref 6.5–8.1)

## 2019-11-24 LAB — CBC
HCT: 37.1 % — ABNORMAL LOW (ref 39.0–52.0)
Hemoglobin: 12.2 g/dL — ABNORMAL LOW (ref 13.0–17.0)
MCH: 29.3 pg (ref 26.0–34.0)
MCHC: 32.9 g/dL (ref 30.0–36.0)
MCV: 89.2 fL (ref 80.0–100.0)
Platelets: 364 10*3/uL (ref 150–400)
RBC: 4.16 MIL/uL — ABNORMAL LOW (ref 4.22–5.81)
RDW: 13.1 % (ref 11.5–15.5)
WBC: 10.7 10*3/uL — ABNORMAL HIGH (ref 4.0–10.5)
nRBC: 0 % (ref 0.0–0.2)

## 2019-11-24 LAB — D-DIMER, QUANTITATIVE: D-Dimer, Quant: 1.6 ug/mL-FEU — ABNORMAL HIGH (ref 0.00–0.50)

## 2019-11-24 LAB — MAGNESIUM: Magnesium: 2.3 mg/dL (ref 1.7–2.4)

## 2019-11-24 LAB — C-REACTIVE PROTEIN: CRP: 0.6 mg/dL (ref <1.0)

## 2019-11-24 LAB — GLUCOSE, CAPILLARY
Glucose-Capillary: 155 mg/dL — ABNORMAL HIGH (ref 70–99)
Glucose-Capillary: 211 mg/dL — ABNORMAL HIGH (ref 70–99)

## 2019-11-24 MED ORDER — GUAIFENESIN ER 600 MG PO TB12: 600.0000 mg | ORAL_TABLET | Freq: Two times a day (BID) | ORAL | 2 refills | Status: DC

## 2019-11-24 MED ORDER — ASCORBIC ACID 500 MG PO TABS
500.0000 mg | ORAL_TABLET | Freq: Every day | ORAL | 0 refills | Status: DC
Start: 2019-11-25 — End: 2021-12-23

## 2019-11-24 MED ORDER — DOCUSATE SODIUM 100 MG PO CAPS: 100.0000 mg | ORAL_CAPSULE | Freq: Two times a day (BID) | ORAL | 2 refills | Status: DC

## 2019-11-24 MED ORDER — ZINC SULFATE 220 (50 ZN) MG PO CAPS
220.0000 mg | ORAL_CAPSULE | Freq: Every day | ORAL | 0 refills | Status: DC
Start: 2019-11-25 — End: 2020-01-07

## 2019-11-24 MED ORDER — ALBUTEROL SULFATE HFA 108 (90 BASE) MCG/ACT IN AERS: 2.0000 | INHALATION_SPRAY | Freq: Four times a day (QID) | RESPIRATORY_TRACT | 2 refills | Status: DC | PRN

## 2019-11-24 MED ORDER — PREDNISONE 10 MG PO TABS: ORAL_TABLET | ORAL | 0 refills | Status: DC

## 2019-11-24 NOTE — Care Management Important Message (Signed)
Important Message  Patient Details IM Letter given to the Patient Name: Taylor [name] MRN: [id number] Date of Birth: 06/18/1950   Medicare Important Message Given:  Yes     Kerin Salen 11/24/2019, 11:17 AM

## 2019-11-24 NOTE — Discharge Instructions (Signed)
COVID-19 COVID-19 is a respiratory infection that is caused by a virus called severe acute respiratory syndrome coronavirus 2 (SARS-CoV-2). The disease is also known as coronavirus disease or novel coronavirus. In some people, the virus may not cause any symptoms. In others, it may cause a serious infection. The infection can get worse quickly and can lead to complications, such as:  Pneumonia, or infection of the lungs.  Acute respiratory distress syndrome or ARDS. This is a condition in which fluid build-up in the lungs prevents the lungs from filling with air and passing oxygen into the blood.  Acute respiratory failure. This is a condition in which there is not enough oxygen passing from the lungs to the body or when carbon dioxide is not passing from the lungs out of the body.  Sepsis or septic shock. This is a serious bodily reaction to an infection.  Blood clotting problems.  Secondary infections due to bacteria or fungus.  Organ failure. This is when your body's organs stop working. The virus that causes COVID-19 is contagious. This means that it can spread from person to person through droplets from coughs and sneezes (respiratory secretions). What are the causes? This illness is caused by a virus. You may catch the virus by:  Breathing in droplets from an infected person. Droplets can be spread by a person breathing, speaking, singing, coughing, or sneezing.  Touching something, like a table or a doorknob, that was exposed to the virus (contaminated) and then touching your mouth, nose, or eyes. What increases the risk? Risk for infection You are more likely to be infected with this virus if you:  Are within 6 feet (2 meters) of a person with COVID-19.  Provide care for or live with a person who is infected with COVID-19.  Spend time in crowded indoor spaces or live in shared housing. Risk for serious illness You are more likely to become seriously ill from the virus if  you:  Are 50 years of age or older. The higher your age, the more you are at risk for serious illness.  Live in a nursing home or long-term care facility.  Have cancer.  Have a long-term (chronic) disease such as: ? Chronic lung disease, including chronic obstructive pulmonary disease or asthma. ? A long-term disease that lowers your body's ability to fight infection (immunocompromised). ? Heart disease, including heart failure, a condition in which the arteries that lead to the heart become narrow or blocked (coronary artery disease), a disease which makes the heart muscle thick, weak, or stiff (cardiomyopathy). ? Diabetes. ? Chronic kidney disease. ? Sickle cell disease, a condition in which red blood cells have an abnormal "sickle" shape. ? Liver disease.  Are obese. What are the signs or symptoms? Symptoms of this condition can range from mild to severe. Symptoms may appear any time from 2 to 14 days after being exposed to the virus. They include:  A fever or chills.  A cough.  Difficulty breathing.  Headaches, body aches, or muscle aches.  Runny or stuffy (congested) nose.  A sore throat.  New loss of taste or smell. Some people may also have stomach problems, such as nausea, vomiting, or diarrhea. Other people may not have any symptoms of COVID-19. How is this diagnosed? This condition may be diagnosed based on:  Your signs and symptoms, especially if: ? You live in an area with a COVID-19 outbreak. ? You recently traveled to or from an area where the virus is common. ? You   provide care for or live with a person who was diagnosed with COVID-19. ? You were exposed to a person who was diagnosed with COVID-19.  A physical exam.  Lab tests, which may include: ? Taking a sample of fluid from the back of your nose and throat (nasopharyngeal fluid), your nose, or your throat using a swab. ? A sample of mucus from your lungs (sputum). ? Blood tests.  Imaging tests,  which may include, X-rays, CT scan, or ultrasound. How is this treated? At present, there is no medicine to treat COVID-19. Medicines that treat other diseases are being used on a trial basis to see if they are effective against COVID-19. Your health care provider will talk with you about ways to treat your symptoms. For most people, the infection is mild and can be managed at home with rest, fluids, and over-the-counter medicines. Treatment for a serious infection usually takes places in a hospital intensive care unit (ICU). It may include one or more of the following treatments. These treatments are given until your symptoms improve.  Receiving fluids and medicines through an IV.  Supplemental oxygen. Extra oxygen is given through a tube in the nose, a face mask, or a hood.  Positioning you to lie on your stomach (prone position). This makes it easier for oxygen to get into the lungs.  Continuous positive airway pressure (CPAP) or bi-level positive airway pressure (BPAP) machine. This treatment uses mild air pressure to keep the airways open. A tube that is connected to a motor delivers oxygen to the body.  Ventilator. This treatment moves air into and out of the lungs by using a tube that is placed in your windpipe.  Tracheostomy. This is a procedure to create a hole in the neck so that a breathing tube can be inserted.  Extracorporeal membrane oxygenation (ECMO). This procedure gives the lungs a chance to recover by taking over the functions of the heart and lungs. It supplies oxygen to the body and removes carbon dioxide. Follow these instructions at home: Lifestyle  If you are sick, stay home except to get medical care. Your health care provider will tell you how long to stay home. Call your health care provider before you go for medical care.  Rest at home as told by your health care provider.  Do not use any products that contain nicotine or tobacco, such as cigarettes,  e-cigarettes, and chewing tobacco. If you need help quitting, ask your health care provider.  Return to your normal activities as told by your health care provider. Ask your health care provider what activities are safe for you. General instructions  Take over-the-counter and prescription medicines only as told by your health care provider.  Drink enough fluid to keep your urine pale yellow.  Keep all follow-up visits as told by your health care provider. This is important. How is this prevented?  There is no vaccine to help prevent COVID-19 infection. However, there are steps you can take to protect yourself and others from this virus. To protect yourself:   Do not travel to areas where COVID-19 is a risk. The areas where COVID-19 is reported change often. To identify high-risk areas and travel restrictions, check the CDC travel website: wwwnc.cdc.gov/travel/notices  If you live in, or must travel to, an area where COVID-19 is a risk, take precautions to avoid infection. ? Stay away from people who are sick. ? Wash your hands often with soap and water for 20 seconds. If soap and water   are not available, use an alcohol-based hand sanitizer. ? Avoid touching your mouth, face, eyes, or nose. ? Avoid going out in public, follow guidance from your state and local health authorities. ? If you must go out in public, wear a cloth face covering or face mask. Make sure your mask covers your nose and mouth. ? Avoid crowded indoor spaces. Stay at least 6 feet (2 meters) away from others. ? Disinfect objects and surfaces that are frequently touched every day. This may include:  Counters and tables.  Doorknobs and light switches.  Sinks and faucets.  Electronics, such as phones, remote controls, keyboards, computers, and tablets. To protect others: If you have symptoms of COVID-19, take steps to prevent the virus from spreading to others.  If you think you have a COVID-19 infection, contact  your health care provider right away. Tell your health care team that you think you may have a COVID-19 infection.  Stay home. Leave your house only to seek medical care. Do not use public transport.  Do not travel while you are sick.  Wash your hands often with soap and water for 20 seconds. If soap and water are not available, use alcohol-based hand sanitizer.  Stay away from other members of your household. Let healthy household members care for children and pets, if possible. If you have to care for children or pets, wash your hands often and wear a mask. If possible, stay in your own room, separate from others. Use a different bathroom.  Make sure that all people in your household wash their hands well and often.  Cough or sneeze into a tissue or your sleeve or elbow. Do not cough or sneeze into your hand or into the air.  Wear a cloth face covering or face mask. Make sure your mask covers your nose and mouth. Where to find more information  Centers for Disease Control and Prevention: www.cdc.gov/coronavirus/2019-ncov/index.html  World Health Organization: www.who.int/health-topics/coronavirus Contact a health care provider if:  You live in or have traveled to an area where COVID-19 is a risk and you have symptoms of the infection.  You have had contact with someone who has COVID-19 and you have symptoms of the infection. Get help right away if:  You have trouble breathing.  You have pain or pressure in your chest.  You have confusion.  You have bluish lips and fingernails.  You have difficulty waking from sleep.  You have symptoms that get worse. These symptoms may represent a serious problem that is an emergency. Do not wait to see if the symptoms will go away. Get medical help right away. Call your local emergency services (911 in the [place] Do not drive yourself to the hospital. Let the emergency medical personnel know if you think you have  COVID-19. Summary  COVID-19 is a respiratory infection that is caused by a virus. It is also known as coronavirus disease or novel coronavirus. It can cause serious infections, such as pneumonia, acute respiratory distress syndrome, acute respiratory failure, or sepsis.  The virus that causes COVID-19 is contagious. This means that it can spread from person to person through droplets from breathing, speaking, singing, coughing, or sneezing.  You are more likely to develop a serious illness if you are 50 years of age or older, have a weak immune system, live in a nursing home, or have chronic disease.  There is no medicine to treat COVID-19. Your health care provider will talk with you about ways to treat your symptoms.    Take steps to protect yourself and others from infection. Wash your hands often and disinfect objects and surfaces that are frequently touched every day. Stay away from people who are sick and wear a mask if you are sick. This information is not intended to replace advice given to you by your health care provider. Make sure you discuss any questions you have with your health care provider. Document Revised: 11/29/2018 Document Reviewed: 03/07/2018 Elsevier Patient Education  2020 [employer]   COVID-19 Frequently Asked Questions COVID-19 (coronavirus disease) is an infection that is caused by a large family of viruses. Some viruses cause illness in people and others cause illness in animals like camels, cats, and bats. In some cases, the viruses that cause illness in animals can spread to humans. Where did the coronavirus come from? In December 2019, [place] told the World Health Organization (WHO) of several cases of lung disease (human respiratory illness). These cases were linked to an open seafood and livestock market in the city of [place]. The link to the seafood and livestock market suggests that the virus may have spread from animals to humans. However, since that first  outbreak in December, the virus has also been shown to spread from person to person. What is the name of the disease and the virus? Disease name Early on, this disease was called novel coronavirus. This is because scientists determined that the disease was caused by a new (novel) respiratory virus. The World Health Organization (WHO) has now named the disease COVID-19, or coronavirus disease. Virus name The virus that causes the disease is called severe acute respiratory syndrome coronavirus 2 (SARS-CoV-2). More information on disease and virus naming World Health Organization (WHO): www.who.int/emergencies/diseases/novel-coronavirus-2019/technical-guidance/naming-the-coronavirus-disease-(covid-2019)-and-the-virus-that-causes-it Who is at risk for complications from coronavirus disease? Some people may be at higher risk for complications from coronavirus disease. This includes older adults and people who have chronic diseases, such as heart disease, diabetes, and lung disease. If you are at higher risk for complications, take these extra precautions:  Stay home as much as possible.  Avoid social gatherings and travel.  Avoid close contact with others. Stay at least 6 ft (2 m) away from others, if possible.  Wash your hands often with soap and water for at least 20 seconds.  Avoid touching your face, mouth, nose, or eyes.  Keep supplies on hand at home, such as food, medicine, and cleaning supplies.  If you must go out in public, wear a cloth face covering or face mask. Make sure your mask covers your nose and mouth. How does coronavirus disease spread? The virus that causes coronavirus disease spreads easily from person to person (is contagious). You may catch the virus by:  Breathing in droplets from an infected person. Droplets can be spread by a person breathing, speaking, singing, coughing, or sneezing.  Touching something, like a table or a doorknob, that was exposed to the virus  (contaminated) and then touching your mouth, nose, or eyes. Can I get the virus from touching surfaces or objects? There is still a lot that we do not know about the virus that causes coronavirus disease. Scientists are basing a lot of information on what they know about similar viruses, such as:  Viruses cannot generally survive on surfaces for long. They need a human body (host) to survive.  It is more likely that the virus is spread by close contact with people who are sick (direct contact), such as through: ? Shaking hands or hugging. ? Breathing in respiratory droplets   that travel through the air. Droplets can be spread by a person breathing, speaking, singing, coughing, or sneezing.  It is less likely that the virus is spread when a person touches a surface or object that has the virus on it (indirect contact). The virus may be able to enter the body if the person touches a surface or object and then touches his or her face, eyes, nose, or mouth. Can a person spread the virus without having symptoms of the disease? It may be possible for the virus to spread before a person has symptoms of the disease, but this is most likely not the main way the virus is spreading. It is more likely for the virus to spread by being in close contact with people who are sick and breathing in the respiratory droplets spread by a person breathing, speaking, singing, coughing, or sneezing. What are the symptoms of coronavirus disease? Symptoms vary from person to person and can range from mild to severe. Symptoms may include:  Fever or chills.  Cough.  Difficulty breathing or feeling short of breath.  Headaches, body aches, or muscle aches.  Runny or stuffy (congested) nose.  Sore throat.  New loss of taste or smell.  Nausea, vomiting, or diarrhea. These symptoms can appear anywhere from 2 to 14 days after you have been exposed to the virus. Some people may not have any symptoms. If you develop  symptoms, call your health care provider. People with severe symptoms may need hospital care. Should I be tested for this virus? Your health care provider will decide whether to test you based on your symptoms, history of exposure, and your risk factors. How does a health care provider test for this virus? Health care providers will collect samples to send for testing. Samples may include:  Taking a swab of fluid from the back of your nose and throat, your nose, or your throat.  Taking fluid from the lungs by having you cough up mucus (sputum) into a sterile cup.  Taking a blood sample. Is there a treatment or vaccine for this virus? Currently, there is no vaccine to prevent coronavirus disease. Also, there are no medicines like antibiotics or antivirals to treat the virus. A person who becomes sick is given supportive care, which means rest and fluids. A person may also relieve his or her symptoms by using over-the-counter medicines that treat sneezing, coughing, and runny nose. These are the same medicines that a person takes for the common cold. If you develop symptoms, call your health care provider. People with severe symptoms may need hospital care. What can I do to protect myself and my family from this virus?     You can protect yourself and your family by taking the same actions that you would take to prevent the spread of other viruses. Take the following actions:  Wash your hands often with soap and water for at least 20 seconds. If soap and water are not available, use alcohol-based hand sanitizer.  Avoid touching your face, mouth, nose, or eyes.  Cough or sneeze into a tissue, sleeve, or elbow. Do not cough or sneeze into your hand or the air. ? If you cough or sneeze into a tissue, throw it away immediately and wash your hands.  Disinfect objects and surfaces that you frequently touch every day.  Stay away from people who are sick.  Avoid going out in public, follow  guidance from your state and local health authorities.  Avoid crowded indoor spaces.   Stay at least 6 ft (2 m) away from others.  If you must go out in public, wear a cloth face covering or face mask. Make sure your mask covers your nose and mouth.  Stay home if you are sick, except to get medical care. Call your health care provider before you get medical care. Your health care provider will tell you how long to stay home.  Make sure your vaccines are up to date. Ask your health care provider what vaccines you need. What should I do if I need to travel? Follow travel recommendations from your local health authority, the CDC, and WHO. Travel information and advice  Centers for Disease Control and Prevention (CDC): www.cdc.gov/coronavirus/2019-ncov/travelers/index.html  World Health Organization (WHO): www.who.int/emergencies/diseases/novel-coronavirus-2019/travel-advice Know the risks and take action to protect your health  You are at higher risk of getting coronavirus disease if you are traveling to areas with an outbreak or if you are exposed to travelers from areas with an outbreak.  Wash your hands often and practice good hygiene to lower the risk of catching or spreading the virus. What should I do if I am sick? General instructions to stop the spread of infection  Wash your hands often with soap and water for at least 20 seconds. If soap and water are not available, use alcohol-based hand sanitizer.  Cough or sneeze into a tissue, sleeve, or elbow. Do not cough or sneeze into your hand or the air.  If you cough or sneeze into a tissue, throw it away immediately and wash your hands.  Stay home unless you must get medical care. Call your health care provider or local health authority before you get medical care.  Avoid public areas. Do not take public transportation, if possible.  If you can, wear a mask if you must go out of the house or if you are in close contact with someone  who is not sick. Make sure your mask covers your nose and mouth. Keep your home clean  Disinfect objects and surfaces that are frequently touched every day. This may include: ? Counters and tables. ? Doorknobs and light switches. ? Sinks and faucets. ? Electronics such as phones, remote controls, keyboards, computers, and tablets.  Wash dishes in hot, soapy water or use a dishwasher. Air-dry your dishes.  Wash laundry in hot water. Prevent infecting other household members  Let healthy household members care for children and pets, if possible. If you have to care for children or pets, wash your hands often and wear a mask.  Sleep in a different bedroom or bed, if possible.  Do not share personal items, such as razors, toothbrushes, deodorant, combs, brushes, towels, and washcloths. Where to find more information Centers for Disease Control and Prevention (CDC)  Information and news updates: www.cdc.gov/coronavirus/2019-ncov World Health Organization (WHO)  Information and news updates: www.who.int/emergencies/diseases/novel-coronavirus-2019  Coronavirus health topic: www.who.int/health-topics/coronavirus  Questions and answers on COVID-19: www.who.int/news-room/q-a-detail/q-a-coronaviruses  Global tracker: who.sprinklr.com American Academy of Pediatrics (AAP)  Information for families: www.healthychildren.org/English/health-issues/conditions/chest-lungs/Pages/2019-Novel-Coronavirus.aspx The coronavirus situation is changing rapidly. Check your local health authority website or the CDC and WHO websites for updates and news. When should I contact a health care provider?  Contact your health care provider if you have symptoms of an infection, such as fever or cough, and you: ? Have been near anyone who is known to have coronavirus disease. ? Have come into contact with a person who is suspected to have coronavirus disease. ? Have traveled to an area where there is   an outbreak of  COVID-19. When should I get emergency medical care?  Get help right away by calling your local emergency services (911 in the [place] if you have: ? Trouble breathing. ? Pain or pressure in your chest. ? Confusion. ? Blue-tinged lips and fingernails. ? Difficulty waking from sleep. ? Symptoms that get worse. Let the emergency medical personnel know if you think you have coronavirus disease. Summary  A new respiratory virus is spreading from person to person and causing COVID-19 (coronavirus disease).  The virus that causes COVID-19 appears to spread easily. It spreads from one person to another through droplets from breathing, speaking, singing, coughing, or sneezing.  Older adults and those with chronic diseases are at higher risk of disease. If you are at higher risk for complications, take extra precautions.  There is currently no vaccine to prevent coronavirus disease. There are no medicines, such as antibiotics or antivirals, to treat the virus.  You can protect yourself and your family by washing your hands often, avoiding touching your face, and covering your coughs and sneezes. This information is not intended to replace advice given to you by your health care provider. Make sure you discuss any questions you have with your health care provider. Document Revised: 11/29/2018 Document Reviewed: 05/28/2018 Elsevier Patient Education  2020 [employer]  COVID-19: How to Protect Yourself and Others Know how it spreads  There is currently no vaccine to prevent coronavirus disease 2019 (COVID-19).  The best way to prevent illness is to avoid being exposed to this virus.  The virus is thought to spread mainly from person-to-person. ? Between people who are in close contact with one another (within about 6 feet). ? Through respiratory droplets produced when an infected person coughs, sneezes or talks. ? These droplets can land in the mouths or noses of people who are nearby or  possibly be inhaled into the lungs. ? COVID-19 may be spread by people who are not showing symptoms. Everyone should Clean your hands often  Wash your hands often with soap and water for at least 20 seconds especially after you have been in a public place, or after blowing your nose, coughing, or sneezing.  If soap and water are not readily available, use a hand sanitizer that contains at least 60% alcohol. Cover all surfaces of your hands and rub them together until they feel dry.  Avoid touching your eyes, nose, and mouth with unwashed hands. Avoid close contact  Limit contact with others as much as possible.  Avoid close contact with people who are sick.  Put distance between yourself and other people. ? Remember that some people without symptoms may be able to spread virus. ? This is especially important for people who are at higher risk of getting very sick.www.cdc.gov/coronavirus/2019-ncov/need-extra-precautions/people-at-higher-risk.html Cover your mouth and nose with a mask when around others  You could spread COVID-19 to others even if you do not feel sick.  Everyone should wear a mask in public settings and when around people not living in their household, especially when social distancing is difficult to maintain. ? Masks should not be placed on young children under age 2, anyone who has trouble breathing, or is unconscious, incapacitated or otherwise unable to remove the mask without assistance.  The mask is meant to protect other people in case you are infected.  Do NOT use a facemask meant for a healthcare worker.  Continue to keep about 6 feet between yourself and others. The mask is not a substitute   for social distancing. Cover coughs and sneezes  Always cover your mouth and nose with a tissue when you cough or sneeze or use the inside of your elbow.  Throw used tissues in the trash.  Immediately wash your hands with soap and water for at least 20 seconds. If soap  and water are not readily available, clean your hands with a hand sanitizer that contains at least 60% alcohol. Clean and disinfect  Clean AND disinfect frequently touched surfaces daily. This includes tables, doorknobs, light switches, countertops, handles, desks, phones, keyboards, toilets, faucets, and sinks. www.cdc.gov/coronavirus/2019-ncov/prevent-getting-sick/disinfecting-your-home.html  If surfaces are dirty, clean them: Use detergent or soap and water prior to disinfection.  Then, use a household disinfectant. You can see a list of EPA-registered household disinfectants here. cdc.gov/coronavirus 10/16/2018 This information is not intended to replace advice given to you by your health care provider. Make sure you discuss any questions you have with your health care provider. Document Revised: 10/24/2018 Document Reviewed: 08/22/2018 Elsevier Patient Education  2020 [employer]  10 Things You Can Do to Manage Your COVID-19 Symptoms at Home If you have possible or confirmed COVID-19: 1. Stay home from work and school. And stay away from other public places. If you must go out, avoid using any kind of public transportation, ridesharing, or taxis. 2. Monitor your symptoms carefully. If your symptoms get worse, call your healthcare provider immediately. 3. Get rest and stay hydrated. 4. If you have a medical appointment, call the healthcare provider ahead of time and tell them that you have or may have COVID-19. 5. For medical emergencies, call 911 and notify the dispatch personnel that you have or may have COVID-19. 6. Cover your cough and sneezes with a tissue or use the inside of your elbow. 7. Wash your hands often with soap and water for at least 20 seconds or clean your hands with an alcohol-based hand sanitizer that contains at least 60% alcohol. 8. As much as possible, stay in a specific room and away from other people in your home. Also, you should use a separate bathroom, if  available. If you need to be around other people in or outside of the home, wear a mask. 9. Avoid sharing personal items with other people in your household, like dishes, towels, and bedding. 10. Clean all surfaces that are touched often, like counters, tabletops, and doorknobs. Use household cleaning sprays or wipes according to the label instructions. cdc.gov/coronavirus 08/14/2018 This information is not intended to replace advice given to you by your health care provider. Make sure you discuss any questions you have with your health care provider. Document Revised: 01/16/2019 Document Reviewed: 01/16/2019 Elsevier Patient Education  2020 [employer] Inc.  

## 2019-11-24 NOTE — Progress Notes (Signed)
SATURATION QUALIFICATIONS: (This note is used to comply with regulatory documentation for home oxygen)  Patient Saturations on Room Air at Rest =86%  Patient Saturations on 4 Liters of oxygen while Ambulating = 92%  Please briefly explain why patient needs home oxygen:

## 2019-11-24 NOTE — Progress Notes (Signed)
Gave patient discharge instructions, patient verbalized understanding. Patient awaiting oxygen and a ride home.

## 2019-11-24 NOTE — Plan of Care (Signed)

## 2019-11-24 NOTE — Progress Notes (Signed)
OT Cancellation Note  Patient Details Name: Taylor [name] MRN: [id number] DOB: 06/06/1950   Cancelled Treatment:    Reason Eval/Treat Not Completed: Other (comment) Patient working with PT upon arrival. Will check back 10/12 as schedule permits.  Delbert Phenix OT OT pager: [place] 11/24/2019, 11:30 AM

## 2019-11-24 NOTE — TOC Transition Note (Signed)
Transition of Care [facility]) - CM/SW Discharge Note   Patient Details  Name: Taylor [name] MRN: [id number] Date of Birth: May 01, 1950  [facility] CM/SW Contact:  Trish Mage, LCSW Phone Number: 11/24/2019, 2:13 PM   Clinical Narrative:  Patient who is d/cing today is in need of home O2, has a ride.  SAT note and order seen and appreciated.  Contacted Zach with [employer] who will arrange for delivery of travel cannister to patient room, home unit to home.  No further needs identified. TOC sign off.     Final next level of care: Home/Self Care Barriers to Discharge: No Barriers Identified   Patient Goals and CMS Choice        Discharge Placement                       Discharge Plan and Services                                     Social Determinants of Health (SDOH) Interventions     Readmission Risk Interventions No flowsheet data found.

## 2019-11-24 NOTE — Progress Notes (Signed)
Physical Therapy Treatment Patient Details Name: Taylor [name] MRN: [id number] DOB: [birth year] Today's Date: 11/24/2019    History of Present Illness Taylor [name] is a 69 y.o.WM PMHx CAD s/p CABG in 2019, ischemic cardiomyopathy with EF of 40-45%/diastolic CHF grade 2, HTN, nephrolithiasis, DM type II trolled with complication, HLD.Tested positive for COVID at [facility] on 11/06/19.  Went [facility] 11/12/19, during infusion became hypoxic to 86% on RA.  Put on 4L with improvement in sat and sent to ED.    PT Comments    Pt is progressing, improving activity tolerance/decreasing O2 reliance.  SpO2= 90s at rest on 3L SpO2=92-95% on 4L with amb (initial drop on standing however likely artifact/poor waveform noted) SPO2=87-91% with exercise/repeated sit<>stands on 3L Returned to 3l at rest with Sats remaining in 90s. Minimal DOE/no significant  incr WOB.  Continue PT in acute setting. Reviewed proper breathing techniques.  Follow Up Recommendations  No PT follow up     Equipment Recommendations  None recommended by PT    Recommendations for Other Services       Precautions / Restrictions Precautions Precautions: None Precaution Comments: monitor o2 sats Restrictions Weight Bearing Restrictions: No    Mobility  Bed Mobility Overal bed mobility: Independent                Transfers   Equipment used: None Transfers: Sit to/from Stand Sit to Stand: Supervision         General transfer comment: Supervision for safety  Ambulation/Gait Ambulation/Gait assistance: Min guard;Supervision Gait Distance (Feet): 200 Feet Assistive device: None Gait Pattern/deviations: Step-through pattern;Drifts right/left Gait velocity: decr   General Gait Details: intermittent unsteadiness/hesitation however no overt LOB. min/guard for safety    Stairs             Wheelchair Mobility    Modified Rankin (Stroke Patients Only)       Balance                                             Cognition Arousal/Alertness: Awake/alert Behavior During Therapy: WFL for tasks assessed/performed Overall Cognitive Status: Within Functional Limits for tasks assessed                                        Exercises Other Exercises Other Exercises: sit to stand x5 Other Exercises: educated and encouraged proning    General Comments        Pertinent Vitals/Pain Pain Assessment: No/denies pain    Home Living                      Prior Function            PT Goals (current goals can now be found in the care plan section) Acute Rehab PT Goals Patient Stated Goal: to go home PT Goal Formulation: With patient Time For Goal Achievement: 12/04/19 Potential to Achieve Goals: Good Progress towards PT goals: Progressing toward goals    Frequency    Min 3X/week      PT Plan Current plan remains appropriate    Co-evaluation              AM-PAC PT "6 Clicks" Mobility   Outcome Measure  Help needed turning from your  back to your side while in a flat bed without using bedrails?: None Help needed moving from lying on your back to sitting on the side of a flat bed without using bedrails?: None Help needed moving to and from a bed to a chair (including a wheelchair)?: A Little Help needed standing up from a chair using your arms (e.g., wheelchair or bedside chair)?: A Little Help needed to walk in hospital room?: A Little Help needed climbing 3-5 steps with a railing? : A Little 6 Click Score: 20    End of Session Equipment Utilized During Treatment: Oxygen Activity Tolerance: Patient tolerated treatment well Patient left: in bed;with call bell/phone within reach;with nursing/sitter in room Nurse Communication: Mobility status PT Visit Diagnosis: Difficulty in walking, not elsewhere classified (R26.2)     Time: 3818-2993 PT Time Calculation (min) (ACUTE ONLY): 21 min  Charges:  $Gait  Training: 8-22 mins                     Baxter Flattery, PT  Acute Rehab Dept ([place]) [phone] Pager [phone]  11/24/2019    [facility] 11/24/2019, 11:51 AM

## 2019-11-25 DIAGNOSIS — J9601 Acute respiratory failure with hypoxia: Secondary | ICD-10-CM | POA: Diagnosis not present

## 2019-11-25 DIAGNOSIS — U071 COVID-19: Secondary | ICD-10-CM | POA: Diagnosis not present

## 2019-11-26 NOTE — Discharge Summary (Signed)
[facility] Discharge Summary   Patient: AMEDEO DETWEILER YPP:[id number]  PCP: Haywood Pao, MD  Date of admission: 11/12/2019   Date of discharge: 11/24/2019      Discharge Diagnoses:  Principal Problem:   Acute hypoxemic respiratory failure due to COVID-19 [facility]) Active Problems:   Essential hypertension   Hyperlipidemia associated with type 2 diabetes mellitus ([place])   Coronary artery disease involving native coronary artery of native heart without angina pectoris   Ischemic cardiomyopathy - pre-CABG ECHO: 40-45%   Renal insufficiency   Admitted From: Home Disposition:  Home   Recommendations for Outpatient Follow-up:  1. PCP: Follow-up with PCP in 1 week 2. Follow up LABS/TEST: None   Follow-up Information    Tisovec, Fransico Him, MD. Schedule an appointment as soon as possible for a visit in 1 week(s).   Specialty: Internal Medicine Contact information: [address] [place] [address] [phone]              Diet recommendation: Cardiac diet  Activity: The patient is advised to gradually reintroduce usual activities, as tolerated  Discharge Condition: stable  Code Status: Full code   History of present illness: As per the H and P dictated on admission, "MATTIAS [name] is a 69 y.o. male with medical history significant of CAD s/p CABG in 2019, ICM with EF of 40-45% and G2 DD, kidney stones, DM2, HTN, HLD.  Pt presents to the ED with c/o hypoxia.  Pt has been having nonproductive cough for about a week.  Tested positive for [facility] at [facility] on 11/06[facility].  Went to [facility] earlier today.  Got MAB infusion but during infusion became hypoxic to 86% on RA.  Put on 4L with improvement in sat and sent to ED.  Patient has had continued fevers, myalgias as well as diarrhea without melena or blood per rectum.  No abd pain.  No h/o PE nor DVT."  Hospital Course:  Summary of his active problems in the hospital is as following. 1. Acute  COVID-19 Viral Pneumonia Acute hypoxic respiratory failure, POA Orthodeoxia CXR: hazy bilateral peripheral opacities CT scan of the chest, negative for pulmonary embolism shows multifocal pneumonia with groundglass opacities. Oxygen requirement: 2 LPM at rest 94%, 4 L on exertion, continue 4 L on discharge CRP: 12.7-1.4-2.6-7.0-9.2-3.7-0.8 Remdesivir: Completed the treatment Steroids: Was on oral prednisone. Now back on IV Solu-Medrol.  Now tapering back to steroids on oral. Baricitinib/Actemra(off-label use):  Treated during the hospital stay with [place] adjusting dosage based on renal function. The investigational nature of this medication was discussed with the patient/HCPOA and they choose to proceed as the potential benefits are felt to outweigh risks at this time.  Antibiotics: Not indicated Vitamin C and Zinc: Continue DVT Prophylaxis: enoxaparin (LOVENOX) injection 40 mg Start: 11/12/19 2200 Prone positioning and incentive spirometer use recommended.  Etiology of the patient's severe worsening of hypoxia on exertion is not clear.  Echocardiogram did not show any evidence of shunting.  Does not appear to have significant liver disease history.  CT PE protocol also negative for any pulmonary embolism or any other shunting or significant findings.  For now we will continue with current management for supportive therapy.  The treatment plan and use of medications and known side effects were discussed with patient/family. It was clearly explained that Complete risks and long-term side effects are unknown, however in the best clinical judgment they seem to be of some clinical benefit rather than medical risks. Patient/family  agree with the treatment plan and want to receive these treatments as indicated.   2. Essential hypertension. Ischemic cardiomyopathy. CAD. Continue Toprol, as needed Lasix. Echocardiogram shows improvement in EF.  3. Type 2 diabetes mellitus,  uncontrolled with hyperglycemia with hyperlipidemia and renal dysfunction Treated with sliding scale insulin as well as Levemir. Occasionally patient also had some hypoglycemic episodes. We will continue home regimen on discharge.  4.  Acute kidney injury on CKD 3 a Renal fluctuated in the hospital. At the time of the discharge back to baseline.  5. Hypokalemia Replaced..  Patient was ambulatory without any assistance. On the day of the discharge the patient's vitals were stable, and no other acute medical condition were reported by patient. The patient was felt safe to be discharge at Home with no therapy needed on discharge.  Consultants: none Procedures: Echocardiogram   Discharge Exam: General: Appear in no distress, no Rash; Oral Mucosa Clear, moist. no Abnormal Neck Mass Or lumps, Conjunctiva normal  Cardiovascular: S1 and S2 Present, no Murmur Respiratory: good respiratory effort, Bilateral Air entry present and bilateral  Crackles, no wheezes Abdomen: Bowel Sound present, Soft and no tenderness Extremities: no Pedal edema Neurology: alert and oriented to time, place, and person affect appropriate. no new focal deficit  Filed Weights   11/22/19 0442 11/23/19 0549 11/24/19 0451  Weight: 88.1 kg 88.2 kg 89 kg   Vitals:   11/24/19 0918 11/24/19 1437  BP: 139/72 (!) 148/78  Pulse: 65 71  Resp:  19  Temp:  98.2 F (36.8 C)  SpO2: 93% 94%    DISCHARGE MEDICATION: Allergies as of 11/24/2019   No Known Allergies     Medication List    STOP taking these medications   lisinopril 10 MG tablet Commonly known as: ZESTRIL   metoprolol tartrate 25 MG tablet Commonly known as: LOPRESSOR     TAKE these medications   albuterol 108 (90 Base) MCG/ACT inhaler Commonly known as: VENTOLIN HFA Inhale 2 puffs into the lungs every 6 (six) hours as needed for wheezing or shortness of breath.   ascorbic acid 500 MG tablet Commonly known as: VITAMIN C Take 1 tablet (500 mg  total) by mouth daily.   aspirin EC 81 MG tablet Take 1 tablet (81 mg total) by mouth daily.   atorvastatin 80 MG tablet Commonly known as: LIPITOR Take 1 tablet (80 mg total) by mouth daily at 6 PM.   docusate sodium 100 MG capsule Commonly known as: Colace Take 1 capsule (100 mg total) by mouth 2 (two) times daily.   guaiFENesin 600 MG 12 hr tablet Commonly known as: Mucinex Take 1 tablet (600 mg total) by mouth 2 (two) times daily.   metFORMIN 500 MG tablet Commonly known as: GLUCOPHAGE Take 500 mg by mouth 2 (two) times daily.   metoprolol succinate 25 MG 24 hr tablet Commonly known as: Toprol XL Take 1 tablet (25 mg total) by mouth 2 (two) times daily. NEED FOLLOW UP FOR FUTURE REFILL..   multivitamin tablet Take 1 tablet by mouth daily.   predniSONE 10 MG tablet Commonly known as: DELTASONE Take 50mg  daily for 3days,Take 40mg  daily for 3days,Take 30mg  daily for 3days,Take 20mg  daily for 3days,Take 10mg  daily for 3days, then stop   Trulicity 1.5 HT/3.4KA Sopn Generic drug: Dulaglutide Inject 1.5 mg into the skin once a week. Take every Sunday   zinc sulfate 220 (50 Zn) MG capsule Take 1 capsule (220 mg total) by mouth daily.  No Known Allergies Discharge Instructions    Diet - low sodium heart healthy   Complete by: As directed    Discharge instructions   Complete by: As directed    It is important that you read the given instructions as well as go over your medication list with RN to help you understand your care after this hospitalization.  Please follow-up with PCP in 1-2 weeks.  Please note that NO REFILLS for any discharge medications will be authorized once you are discharged, as it is imperative that you return to your primary care physician (or establish a relationship with a primary care physician if you do not have one) for your aftercare needs so that they can reassess your need for medications and monitor your lab values.  Please request your  primary care physician to go over all Hospital Tests and Procedure/Radiological results at the follow up. Please get all Hospital records sent to your PCP by signing hospital release before you go home.    Do not take more than prescribed Pain, Sleep and Anxiety Medications.  You were cared for by a hospitalist during your hospital stay. If you have any questions about your discharge medications or the care you received while you were in the hospital after you are discharged, you can call the unit @UNIT @ you were admitted to and ask to speak with the hospitalist Berle Mull. Ask for Hospitalist on call if the hospitalist that took care of you is not available.   Once you are discharged, your primary care physician will handle any further medical issues.  You Must read complete instructions/literature along with all the possible adverse reactions/side effects for all the Medicines you take and that have been prescribed to you. Take any new Medicines after you have completely understood and accept all the possible adverse reactions/side effects.  If you have smoked or chewed Tobacco in the last 2 yrs please STOP smoking Wear Seat belts while driving.   Increase activity slowly   Complete by: As directed       The results of significant diagnostics from this hospitalization (including imaging, microbiology, ancillary and laboratory) are listed below for reference.    Significant Diagnostic Studies: CT ANGIO CHEST PE W OR WO CONTRAST  Result Date: 11/23/2019 CLINICAL DATA:  History of COVID-19. EXAM: CT ANGIOGRAPHY CHEST WITH CONTRAST TECHNIQUE: Multidetector CT imaging of the chest was performed using the standard protocol during bolus administration of intravenous contrast. Multiplanar CT image reconstructions and MIPs were obtained to evaluate the vascular anatomy. CONTRAST:  160mL OMNIPAQUE IOHEXOL 350 MG/ML SOLN COMPARISON:  Chest radiograph 11/19/2019 FINDINGS: Cardiovascular: Heart is  mildly enlarged. Status post median sternotomy. Thoracic aortic vascular calcifications. No pericardial effusion. Adequate opacification the pulmonary arterial system. Motion artifact limits evaluation. No evidence for acute pulmonary embolus. Mediastinum/Nodes: No enlarged axillary, mediastinal or hilar lymphadenopathy. Normal appearance of the esophagus. Lungs/Pleura: Central airways are patent. Extensive sharply marginated peripheral ground-glass and consolidative opacities throughout the lungs bilaterally. No pleural effusion or pneumothorax. Upper Abdomen: 1.3 cm exophytic dense structure superior pole right kidney (image 77; series 4), potentially representing a hemorrhagic/proteinaceous cyst. This is incompletely characterized. No acute process. Musculoskeletal: Thoracic spine degenerative changes. Prior median sternotomy. No aggressive or acute appearing osseous lesions. Review of the MIP images confirms the above findings. IMPRESSION: 1. No evidence for acute pulmonary embolus. 2. Extensive sharply marginated peripheral ground-glass and consolidative opacities throughout the lungs bilaterally which may represent atypical/viral pneumonia. 3. Aortic atherosclerosis. 4. Incompletely characterized exophytic  dense structure superior pole right kidney, potentially a hemorrhagic/proteinaceous cyst. Consider dedicated evaluation in outpatient setting. Electronically Signed   By: Lovey Newcomer M.D.   On: 11/23/2019 14:23   DG CHEST PORT 1 VIEW  Result Date: 11/19/2019 CLINICAL DATA:  69 year old male with shortness of breath. Positive COVID-19. EXAM: PORTABLE CHEST 1 VIEW COMPARISON:  Portable chest 11/17/2019 and earlier. FINDINGS: Portable AP semi upright view at 1043 hours. Prior CABG. Stable cardiac size and mediastinal contours. Stable lung volumes. Increasing patchy bilateral peripheral pulmonary opacity. No pneumothorax, pulmonary edema or pleural effusion. Negative visible bowel gas pattern. Stable  visualized osseous structures. IMPRESSION: Mild radiographic progression of bilateral COVID-19 pneumonia. Electronically Signed   By: Genevie Ann M.D.   On: 11/19/2019 11:08   DG CHEST PORT 1 VIEW  Result Date: 11/17/2019 CLINICAL DATA:  COVID-19 positive patient requiring oxygen. EXAM: PORTABLE CHEST 1 VIEW COMPARISON:  Single-view of the chest 11/12/2019. FINDINGS: Subtle, patchy foci of airspace opacity are best visualized in the right mid lung zone and new since the prior exam. Heart size is upper normal. The patient is status post CABG. No pneumothorax or pleural effusion. IMPRESSION: Subtle airspace disease in the right lung is new since the prior examination and most consistent with pneumonia. Electronically Signed   By: Inge Rise M.D.   On: 11/17/2019 14:16   DG Chest Portable 1 View  Result Date: 11/12/2019 CLINICAL DATA:  COVID hypoxia EXAM: PORTABLE CHEST 1 VIEW COMPARISON:  Chest chest x-ray 01/04/2018 FINDINGS: The heart size and mediastinal contours are unchanged. Aortic arch calcifications. Scattered hazy bilateral lower lung zone airspace opacities. No pulmonary edema. No pleural effusion. No pneumothorax. No acute osseous abnormality. Sternotomy wires and surgical changes overlie the mediastinum. IMPRESSION: Scattered hazy bilateral lower lung zone airspace opacities may represent infection versus atelectasis. Electronically Signed   By: Iven Finn M.D.   On: 11/12/2019 19:14   ECHOCARDIOGRAM COMPLETE  Result Date: 11/17/2019    ECHOCARDIOGRAM REPORT   Patient Name:   DACARI BECKSTRAND Date of Exam: 11/17/2019 Medical Rec #:  [id number]        Height:       71.0 in Accession #:    [id number]       Weight:       201.7 lb Date of Birth:  [birth year]        BSA:          2.116 m Patient Age:    66 years         BP:           137/62 mmHg Patient Gender: M                HR:           79 bpm. Exam Location:  Inpatient Procedure: 2D Echo, Cardiac Doppler and Color Doppler Indications:     A26.33 Chronic diastolic (congestive) heart failure  History:        Patient has prior history of Echocardiogram examinations, most                 recent 01/01/2018. Cardiomyopathy, CAD, Abnormal ECG and Prior                 CABG, Arrythmias:Ventricular bigeminy; Risk Factors:Diabetes,                 Hypertension and Dyslipidemia. Covid positive. Hypoxia. PSVT.  Sonographer:    Roseanna Rainbow RDCS Referring Phys: [id number] [place]  Sonographer Comments: Technically difficult study due to poor echo windows. Image acquisition challenging due to patient body habitus. Apical images were located in patient armpit region. IMPRESSIONS  1. Abnormal septal motion . Left ventricular ejection fraction, by estimation, is 50 to 55%. The left ventricle has low normal function. The left ventricle has no regional wall motion abnormalities. There is mild left ventricular hypertrophy. Left ventricular diastolic parameters were normal.  2. Right ventricular systolic function is normal. The right ventricular size is normal.  3. Left atrial size was moderately dilated.  4. The mitral valve is normal in structure. Trivial mitral valve regurgitation. No evidence of mitral stenosis.  5. The aortic valve is tricuspid. Aortic valve regurgitation is not visualized. Mild to moderate aortic valve sclerosis/calcification is present, without any evidence of aortic stenosis.  6. The inferior vena cava is normal in size with greater than 50% respiratory variability, suggesting right atrial pressure of 3 mmHg. FINDINGS  Left Ventricle: Abnormal septal motion. Left ventricular ejection fraction, by estimation, is 50 to 55%. The left ventricle has low normal function. The left ventricle has no regional wall motion abnormalities. The left ventricular internal cavity size was normal in size. There is mild left ventricular hypertrophy. Left ventricular diastolic parameters were normal. Right Ventricle: The right ventricular size is normal. No increase  in right ventricular wall thickness. Right ventricular systolic function is normal. Left Atrium: Left atrial size was moderately dilated. Right Atrium: Right atrial size was normal in size. Pericardium: There is no evidence of pericardial effusion. Mitral Valve: The mitral valve is normal in structure. Trivial mitral valve regurgitation. No evidence of mitral valve stenosis. Tricuspid Valve: The tricuspid valve is normal in structure. Tricuspid valve regurgitation is mild . No evidence of tricuspid stenosis. Aortic Valve: The aortic valve is tricuspid. Aortic valve regurgitation is not visualized. Mild to moderate aortic valve sclerosis/calcification is present, without any evidence of aortic stenosis. Pulmonic Valve: The pulmonic valve was normal in structure. Pulmonic valve regurgitation is mild. No evidence of pulmonic stenosis. Aorta: The aortic root is normal in size and structure. Venous: The inferior vena cava is normal in size with greater than 50% respiratory variability, suggesting right atrial pressure of 3 mmHg. IAS/Shunts: No atrial level shunt detected by color flow Doppler.  LEFT VENTRICLE PLAX 2D LVIDd:         5.30 cm      Diastology LVIDs:         3.90 cm      LV e' medial:    4.03 cm/s LV PW:         1.50 cm      LV E/e' medial:  14.4 LV IVS:        1.20 cm      LV e' lateral:   8.38 cm/s LVOT diam:     2.20 cm      LV E/e' lateral: 6.9 LV SV:         68 LV SV Index:   32 LVOT Area:     3.80 cm  LV Volumes (MOD) LV vol d, MOD A2C: 116.0 ml LV vol d, MOD A4C: 114.5 ml LV vol s, MOD A2C: 57.4 ml LV vol s, MOD A4C: 60.4 ml LV SV MOD A2C:     58.6 ml LV SV MOD A4C:     114.5 ml LV SV MOD BP:      57.4 ml RIGHT VENTRICLE         IVC TAPSE (M-mode): 1.4  cm  IVC diam: 2.60 cm LEFT ATRIUM           Index       RIGHT ATRIUM           Index LA diam:      4.90 cm 2.32 cm/m  RA Area:     17.10 cm LA Vol (A2C): 38.0 ml 17.96 ml/m RA Volume:   39.30 ml  18.57 ml/m LA Vol (A4C): 60.3 ml 28.49 ml/m  AORTIC  VALVE             PULMONIC VALVE LVOT Vmax:   90.20 cm/s  PR End Diast Vel: 2.00 msec LVOT Vmean:  65.400 cm/s LVOT VTI:    0.179 m  AORTA Ao Root diam: 4.00 cm Ao Asc diam:  3.60 cm MITRAL VALVE MV Area (PHT): 2.62 cm    SHUNTS MV Decel Time: 289 msec    Systemic VTI:  0.18 m MV E velocity: 58.20 cm/s  Systemic Diam: 2.20 cm MV A velocity: 74.70 cm/s MV E/A ratio:  0.78 Jenkins Rouge MD Electronically signed by Jenkins Rouge MD Signature Date/Time: 11/17/2019/4:03:06 PM    Final     Microbiology: No results found for this or any previous visit (from the past 240 hour(s)).   Labs: CBC: Recent Labs  Lab 11/20/19 0402 11/21/19 0427 11/23/19 0437 11/24/19 0447  WBC 5.9 12.8* 13.6* 10.7*  NEUTROABS 5.3 12.0*  --   --   HGB 13.4 12.9* 12.9* 12.2*  HCT 39.6 38.3* 38.7* 37.1*  MCV 88.2 88.2 88.6 89.2  PLT 370 383 408* 210   Basic Metabolic Panel: Recent Labs  Lab 11/20/19 0402 11/21/19 0427 11/22/19 0440 11/23/19 0437 11/24/19 0447  NA 138 138 137 137 137  K 4.2 4.7 4.7 4.7 5.4*  CL 105 106 106 106 105  CO2 24 24 23 24 24   GLUCOSE 198* 240* 209* 216* 199*  BUN 36* 52* 54* 47* 40*  CREATININE 1.27* 1.39* 1.34* 1.13 1.18  CALCIUM 8.6* 8.5* 8.3* 8.4* 8.3*  MG 2.4 2.5* 2.4 2.4 2.3   Liver Function Tests: Recent Labs  Lab 11/20/19 0402 11/21/19 0427 11/23/19 0437 11/24/19 0447  AST 23 22 24 20   ALT 35 34 40 38  ALKPHOS 52 52 60 55  BILITOT 1.5* 1.3* 1.0 0.9  PROT 6.4* 6.2* 6.1* 5.7*  ALBUMIN 2.9* 2.8* 2.9* 2.6*   CBG: Recent Labs  Lab 11/23/19 1117 11/23/19 1628 11/23/19 2055 11/24/19 0732 11/24/19 1126  GLUCAP 243* 313* 266* 155* 211*    Time spent: 35 minutes  Signed:  Berle Mull  [facility] 11/24/2019 3:02 PM

## 2019-12-02 DIAGNOSIS — I255 Ischemic cardiomyopathy: Secondary | ICD-10-CM | POA: Diagnosis not present

## 2019-12-02 DIAGNOSIS — I25810 Atherosclerosis of coronary artery bypass graft(s) without angina pectoris: Secondary | ICD-10-CM | POA: Diagnosis not present

## 2019-12-02 DIAGNOSIS — U071 COVID-19: Secondary | ICD-10-CM | POA: Diagnosis not present

## 2019-12-02 DIAGNOSIS — E1165 Type 2 diabetes mellitus with hyperglycemia: Secondary | ICD-10-CM | POA: Diagnosis not present

## 2019-12-02 DIAGNOSIS — Z9981 Dependence on supplemental oxygen: Secondary | ICD-10-CM | POA: Diagnosis not present

## 2019-12-02 DIAGNOSIS — N1832 Chronic kidney disease, stage 3b: Secondary | ICD-10-CM | POA: Diagnosis not present

## 2019-12-02 DIAGNOSIS — E1129 Type 2 diabetes mellitus with other diabetic kidney complication: Secondary | ICD-10-CM | POA: Diagnosis not present

## 2019-12-02 DIAGNOSIS — I130 Hypertensive heart and chronic kidney disease with heart failure and stage 1 through stage 4 chronic kidney disease, or unspecified chronic kidney disease: Secondary | ICD-10-CM | POA: Diagnosis not present

## 2019-12-02 DIAGNOSIS — E7800 Pure hypercholesterolemia, unspecified: Secondary | ICD-10-CM | POA: Diagnosis not present

## 2019-12-02 DIAGNOSIS — E663 Overweight: Secondary | ICD-10-CM | POA: Diagnosis not present

## 2019-12-14 ENCOUNTER — Other Ambulatory Visit: Payer: Self-pay | Admitting: Cardiology

## 2019-12-26 DIAGNOSIS — J9601 Acute respiratory failure with hypoxia: Secondary | ICD-10-CM | POA: Diagnosis not present

## 2019-12-26 DIAGNOSIS — U071 COVID-19: Secondary | ICD-10-CM | POA: Diagnosis not present

## 2020-01-07 ENCOUNTER — Ambulatory Visit (INDEPENDENT_AMBULATORY_CARE_PROVIDER_SITE_OTHER): Payer: PPO | Admitting: Pulmonary Disease

## 2020-01-07 ENCOUNTER — Encounter: Payer: Self-pay | Admitting: Pulmonary Disease

## 2020-01-07 ENCOUNTER — Other Ambulatory Visit: Payer: Self-pay

## 2020-01-07 VITALS — BP 128/72 | HR 94 | Temp 97.2°F | Ht 71.0 in | Wt 202.8 lb

## 2020-01-07 DIAGNOSIS — U099 Post covid-19 condition, unspecified: Secondary | ICD-10-CM | POA: Diagnosis not present

## 2020-01-07 NOTE — Progress Notes (Signed)
Taylor [name]    [id number]    09/07/1950  Primary Care Physician:Tisovec, Fransico Him, MD  Referring Physician: Haywood Pao, MD [address] [place],  [place] [address]  Chief complaint: Follow-up for post Covid  HPI: 69 year old with hypertension, hyperlipidemia, coronary artery disease, ischemic cardiomyopathy admitted with COVID-19 pneumonia on 11/12/2019. He was treated with high flow nasal cannula, remdesivir, steroids and baricitinib. He received 15 days of steroid taper post discharge and was sent home on 3 L oxygen. He has weaned himself off oxygen during the daytime and continues on oxygen at night Overall his major slow recovery. Still has some persistent dyspnea on exertion.  Pets: Has a dog Occupation: Retired Agricultural consultant Exposures: No known exposures. No mold, hot tub, Jacuzzi Smoking history: Non-smoker Travel history: No significant travel history Relevant family history: No significant family history of lung disease   Outpatient Encounter Medications as of 01/07/2020  Medication Sig  . ascorbic acid (VITAMIN C) 500 MG tablet Take 1 tablet (500 mg total) by mouth daily.  Marland Kitchen aspirin EC 81 MG tablet Take 1 tablet (81 mg total) by mouth daily.  Marland Kitchen atorvastatin (LIPITOR) 80 MG tablet Take 1 tablet (80 mg total) by mouth daily at 6 PM.  . metFORMIN (GLUCOPHAGE) 500 MG tablet Take 500 mg by mouth 2 (two) times daily.  . metoprolol succinate (TOPROL-XL) 25 MG 24 hr tablet TAKE 1 TABLET BY MOUTH TWICE DAILY  . Multiple Vitamin (MULTIVITAMIN) tablet Take 1 tablet by mouth daily.  . TRULICITY 1.5 SW/5.4OE SOPN Inject 1.5 mg into the skin once a week. Take every Sunday  . albuterol (VENTOLIN HFA) 108 (90 Base) MCG/ACT inhaler Inhale 2 puffs into the lungs every 6 (six) hours as needed for wheezing or shortness of breath. (Patient not taking: Reported on 01/07/2020)  . [DISCONTINUED] docusate sodium (COLACE) 100 MG capsule Take 1 capsule (100 mg total) by mouth 2  (two) times daily.  . [DISCONTINUED] guaiFENesin (MUCINEX) 600 MG 12 hr tablet Take 1 tablet (600 mg total) by mouth 2 (two) times daily.  . [DISCONTINUED] predniSONE (DELTASONE) 10 MG tablet Take 50mg  daily for 3days,Take 40mg  daily for 3days,Take 30mg  daily for 3days,Take 20mg  daily for 3days,Take 10mg  daily for 3days, then stop  . [DISCONTINUED] zinc sulfate 220 (50 Zn) MG capsule Take 1 capsule (220 mg total) by mouth daily.   No facility-administered encounter medications on file as of 01/07/2020.    Allergies as of 01/07/2020  . (No Known Allergies)    Past Medical History:  Diagnosis Date  . Bigeminal rhythm 08/01/2017   per EKG 07-24-2017 in epic (per confirmation new since last ecg in 2007)  in care everywhere in epic ecg dated 2012 shows sinus brady rate 53 no pvc's --per pt asymptomatic and   . Bradycardia   . Coronary artery disease involving native coronary artery 08/2017   Abnormal Myoview 08/2017 (Abnormal EKG - Severe ST depressions, PVCs & nsVT). TID noted.  Moderate LAD territory Ischemic Perfusion defect --> Cath with extensive RCA (RPDA & RPAV), Cx & LAD disease --> CABG x 4  . Family history of premature coronary artery disease    Father - MI 17. Paternal uncle x 2 -1 with MI & other with CABG in 26s  . History of contusion    ED visit 08-04-2013 , 9 days past injury ,  dx concussion w/ LOC, per CT inferior frontal lobes contusion hemarrhage-- residual headache (08-01-2017  per pt  residual resolved)  . History of kidney stones   . Hypercholesterolemia   . Hypertension   . Nephrolithiasis    bilateral nonobstructive per CT 07-24-2017  . S/P CABG x 4 09/06/2017   LIMA to LAD, SVG to OM2, Sequential SVG to PDA and RPL2, EVH via right thigh and leg  . Type 2 diabetes mellitus ([place])    followed by pcp  . Ureteral calculi    left  . Wears glasses   . Wears hearing aid in both ears     Past Surgical History:  Procedure Laterality Date  . CORONARY ARTERY BYPASS  GRAFT N/A 09/06/2017   Procedure: CORONARY ARTERY BYPASS GRAFTING (CABG) x 4, using left internal mammary artery and right leg greater saphenous vein harvested endoscopically;  Surgeon: Rexene Alberts, MD;  Location: [facility];  Service: Open Heart Surgery;; LIMA-LAD, SVG-OM2, SeqSVG-RPDA-RPL2  . CYSTOSCOPY WITH RETROGRADE PYELOGRAM, URETEROSCOPY AND STENT PLACEMENT Left 08/02/2018   Procedure: CYSTOSCOPY WITH RETROGRADE PYELOGRAM, URETEROSCOPY AND STENT PLACEMENT;  Surgeon: Cleon Gustin, MD;  Location: [facility];  Service: Urology;  Laterality: Left;  . CYSTOSCOPY/RETROGRADE/URETEROSCOPY Left 08/06/2017   Procedure: CYSTOSCOPY/RETROGRADE/URETEROSCOPY laser and stone basket extrection;  Surgeon: Cleon Gustin, MD;  Location: [facility];  Service: Urology;  Laterality: Left;  . HOLMIUM LASER APPLICATION Left 03/27/9415   Procedure: HOLMIUM LASER APPLICATION;  Surgeon: Cleon Gustin, MD;  [facility]: [facility];  Service: Urology;  Laterality: Left;  . LAPAROSCOPIC APPENDECTOMY  11-18-2005    dr wyatt  [facility]  . LEFT HEART CATH AND CORONARY ANGIOGRAPHY N/A 09/03/2017   Procedure: LEFT HEART CATH AND CORONARY ANGIOGRAPHY;  Surgeon: Leonie Man, MD;  Location: [facility];  Service: Cardiovascular;; mRCA 70%, dRCA 99%-95% into RPAV & 90% ostRPDA 95%. RPL1 70%, distal RVAM (tandem PDA) - 85%. Ost-prox LAD 40% -pLAD 60&90% @ D1 ->p-mLAD 95% @ D2 with 70% D2. pCx 80%, p-mCx 90%-> mCx 85%.  EF 45-50%, global HK. -->> referred for CABG  . NASAL ENDOSCOPY WITH EPISTAXIS CONTROL N/A 11/29/2017   Procedure: NASAL ENDOSCOPY WITH EPISTAXIS CONTROL;  Surgeon: Leta Baptist, MD;  Location: [facility];  Service: ENT;  Laterality: N/A;  . NM MYOVIEW LTD  08/31/2017   HIGH RISK: Combination Imaging & EKG:  with  in tmedium sized, moderate severity Reversible perfusion e apdefect in  the mid and apical anterior, apical lateral walls - FINDINGS ARE C/W ISCHEMIA in the LAD territory,   however given significant arrhythmias, severe ST depressions and TID =  MV CAD is highly likely (SDS =7).   . ORCHIECTOMY  1965   unilateral -- per pt ruptured  . TEE WITHOUT CARDIOVERSION N/A 09/06/2017   Procedure: TRANSESOPHAGEAL ECHOCARDIOGRAM (TEE);  INTRA-OP.  Surgeon: Rexene Alberts, MD;  Location: [facility];  Service: Open Heart Surgery:;; Normal AoV, MV & TV.  Normal RV.  Marland Kitchen TRANSTHORACIC ECHOCARDIOGRAM  09/04/2017   (pre-CABG): EF 40-45%, diffuse HK.  Difficult to assess due to frequent PVCs. Severe LA dilation. Mildly increased PAP.  Marland Kitchen TRANSTHORACIC ECHOCARDIOGRAM  01/01/2018   Post CABG: EF moderately reduced 40 and 45%.  Diffuse HK.  No identifiable R WMA.  GRII DD with severe LA dilation.  (High filling pressures).  Moderate aortic calcification-aortic sclerosis with no stenosis.  Mildly elevated PA pressures.  Marland Kitchen UMBILICAL HERNIA REPAIR  2006  . VARICOSE VEIN SURGERY  2007   left endovascular laser ablation    Family History  Problem Relation Age of  Onset  . Stroke Mother   . Atrial fibrillation Mother   . Clotting disorder Father   . Heart attack Father 31       Died from complications  . Diabetes Daughter   . Heart disease Other   . Stroke Other   . Heart attack Paternal Uncle        In his 57s  . CAD Paternal Uncle        Has had either bypass surgery or stents    Social History   Socioeconomic History  . Marital status: Married    Spouse name: Not on file  . Number of children: Not on file  . Years of education: Not on file  . Highest education level: Not on file  Occupational History  . Not on file  Tobacco Use  . Smoking status: Never Smoker  . Smokeless tobacco: Never Used  Vaping Use  . Vaping Use: Never used  Substance and Sexual Activity  . Alcohol use: No  . Drug use: No  . Sexual activity: Yes  Other Topics Concern  . Not on file  Social History Narrative   married and lives locally in [place] with his family.     He has 2 adult  children and several grandchildren.     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    Social Determinants of Health   Financial Resource Strain:   . Difficulty of Paying Living Expenses: Not on file  Food Insecurity:   . Worried About Charity fundraiser in the Last Year: Not on file  . Ran Out of Food in the Last Year: Not on file  Transportation Needs:   . Lack of Transportation (Medical): Not on file  . Lack of Transportation (Non-Medical): Not on file  Physical Activity:   . Days of Exercise per Week: Not on file  . Minutes of Exercise per Session: Not on file  Stress:   . Feeling of Stress : Not on file  Social Connections:   . Frequency of Communication with Friends and Family: Not on file  . Frequency of Social Gatherings with Friends and Family: Not on file  . Attends Religious Services: Not on file  . Active Member of Clubs or Organizations: Not on file  . Attends Archivist Meetings: Not on file  . Marital Status: Not on file  Intimate Partner Violence:   . Fear of Current or Ex-Partner: Not on file  . Emotionally Abused: Not on file  . Physically Abused: Not on file  . Sexually Abused: Not on file    Review of systems: Review of Systems  Constitutional: Negative for fever and chills.  HENT: Negative.   Eyes: Negative for blurred vision.  Respiratory: as per HPI  Cardiovascular: Negative for chest pain and palpitations.  Gastrointestinal: Negative for vomiting, diarrhea, blood per rectum. Genitourinary: Negative for dysuria, urgency, frequency and hematuria.  Musculoskeletal: Negative for myalgias, back pain and joint pain.  Skin: Negative for itching and rash.  Neurological: Negative for dizziness, tremors, focal weakness, seizures and loss of consciousness.  Endo/Heme/Allergies: Negative for environmental allergies.  Psychiatric/Behavioral: Negative for depression, suicidal ideas and hallucinations.   All other systems reviewed and are negative.  Physical Exam: Blood pressure 128/72, pulse 94, temperature (!) 97.2 F (36.2 C), temperature source Skin, height 5\' 11"  (1.803 m), weight 202 lb 12.8 oz (92 kg), SpO2 96 %. Gen:  No acute distress HEENT:  EOMI, sclera anicteric Neck:     No masses; no thyromegaly Lungs:    Clear to auscultation bilaterally; normal respiratory effort CV:         Regular rate and rhythm; no murmurs Abd:      + bowel sounds; soft, non-tender; no palpable masses, no distension Ext:    No edema; adequate peripheral perfusion Skin:      Warm and dry; no rash Neuro: alert and oriented x 3 Psych: normal mood and affect  Data Reviewed: Imaging: CT abdomen 11/18/2005-visualized lung fields show atelectasis at the base CTA 11/23/2019-no PE, peripheral groundglass opacities and consolidation, lesion in the superior pole of right kidney. I have reviewed the images personally.  PFTs:  Labs:  Assessment:  Post Covid pneumonia Overall he is making gradual recovery the he has some residual dyspnea on exertion Does not need supplemental oxygen during daytime. I have asked him to continue O2 at night. We will reassess with an overnight oximetry in a few months to determine if he can come off that as well.  Schedule high-res CT and PFTs for evaluation of the lung  Plan/Recommendations: High-res CT, PFTs  Marshell Garfinkel MD [facility] 01/07/2020, 10:16 AM  CC: Tisovec, Fransico Him, MD

## 2020-01-07 NOTE — Patient Instructions (Signed)
I am glad you are doing well and improving after recent hospitalization Continue the supplemental oxygen at night.  We will get high-resolution CT scan and pulmonary function test for better evaluation of your lung Follow-up in 2 to 3 months.

## 2020-01-21 DIAGNOSIS — Z23 Encounter for immunization: Secondary | ICD-10-CM | POA: Diagnosis not present

## 2020-01-21 DIAGNOSIS — E1129 Type 2 diabetes mellitus with other diabetic kidney complication: Secondary | ICD-10-CM | POA: Diagnosis not present

## 2020-01-21 DIAGNOSIS — N1832 Chronic kidney disease, stage 3b: Secondary | ICD-10-CM | POA: Diagnosis not present

## 2020-01-21 DIAGNOSIS — E7800 Pure hypercholesterolemia, unspecified: Secondary | ICD-10-CM | POA: Diagnosis not present

## 2020-01-21 DIAGNOSIS — Z9981 Dependence on supplemental oxygen: Secondary | ICD-10-CM | POA: Diagnosis not present

## 2020-01-21 DIAGNOSIS — E663 Overweight: Secondary | ICD-10-CM | POA: Diagnosis not present

## 2020-01-21 DIAGNOSIS — I25810 Atherosclerosis of coronary artery bypass graft(s) without angina pectoris: Secondary | ICD-10-CM | POA: Diagnosis not present

## 2020-01-21 DIAGNOSIS — E1165 Type 2 diabetes mellitus with hyperglycemia: Secondary | ICD-10-CM | POA: Diagnosis not present

## 2020-01-21 DIAGNOSIS — I130 Hypertensive heart and chronic kidney disease with heart failure and stage 1 through stage 4 chronic kidney disease, or unspecified chronic kidney disease: Secondary | ICD-10-CM | POA: Diagnosis not present

## 2020-01-21 DIAGNOSIS — I255 Ischemic cardiomyopathy: Secondary | ICD-10-CM | POA: Diagnosis not present

## 2020-01-25 DIAGNOSIS — J9601 Acute respiratory failure with hypoxia: Secondary | ICD-10-CM | POA: Diagnosis not present

## 2020-01-25 DIAGNOSIS — U071 COVID-19: Secondary | ICD-10-CM | POA: Diagnosis not present

## 2020-02-10 DIAGNOSIS — E1129 Type 2 diabetes mellitus with other diabetic kidney complication: Secondary | ICD-10-CM | POA: Diagnosis not present

## 2020-02-10 DIAGNOSIS — N1832 Chronic kidney disease, stage 3b: Secondary | ICD-10-CM | POA: Diagnosis not present

## 2020-02-10 DIAGNOSIS — I130 Hypertensive heart and chronic kidney disease with heart failure and stage 1 through stage 4 chronic kidney disease, or unspecified chronic kidney disease: Secondary | ICD-10-CM | POA: Diagnosis not present

## 2020-02-18 ENCOUNTER — Ambulatory Visit
Admission: RE | Admit: 2020-02-18 | Discharge: 2020-02-18 | Disposition: A | Discharge status: Home / Self Care | Payer: PPO | Source: Ambulatory Visit | Attending: Pulmonary Disease | Admitting: Pulmonary Disease

## 2020-02-18 DIAGNOSIS — I2510 Atherosclerotic heart disease of native coronary artery without angina pectoris: Secondary | ICD-10-CM | POA: Diagnosis not present

## 2020-02-18 DIAGNOSIS — J189 Pneumonia, unspecified organism: Secondary | ICD-10-CM | POA: Diagnosis not present

## 2020-02-18 DIAGNOSIS — J479 Bronchiectasis, uncomplicated: Secondary | ICD-10-CM | POA: Diagnosis not present

## 2020-02-18 DIAGNOSIS — J84112 Idiopathic pulmonary fibrosis: Secondary | ICD-10-CM | POA: Diagnosis not present

## 2020-02-18 DIAGNOSIS — U099 Post covid-19 condition, unspecified: Secondary | ICD-10-CM

## 2020-02-25 DIAGNOSIS — J9601 Acute respiratory failure with hypoxia: Secondary | ICD-10-CM | POA: Diagnosis not present

## 2020-02-25 DIAGNOSIS — U071 COVID-19: Secondary | ICD-10-CM | POA: Diagnosis not present

## 2020-03-04 ENCOUNTER — Other Ambulatory Visit (HOSPITAL_COMMUNITY)
Admission: RE | Admit: 2020-03-04 | Discharge: 2020-03-04 | Disposition: A | Discharge status: Home / Self Care | Payer: PPO | Source: Ambulatory Visit | Attending: Pulmonary Disease | Admitting: Pulmonary Disease

## 2020-03-04 DIAGNOSIS — Z01812 Encounter for preprocedural laboratory examination: Secondary | ICD-10-CM | POA: Insufficient documentation

## 2020-03-04 DIAGNOSIS — Z20822 Contact with and (suspected) exposure to covid-19: Secondary | ICD-10-CM | POA: Insufficient documentation

## 2020-03-04 LAB — SARS CORONAVIRUS 2 (TAT 6-24 HRS): SARS Coronavirus 2: NEGATIVE

## 2020-03-08 ENCOUNTER — Encounter: Payer: Self-pay | Admitting: Pulmonary Disease

## 2020-03-08 ENCOUNTER — Other Ambulatory Visit: Payer: Self-pay

## 2020-03-08 ENCOUNTER — Ambulatory Visit: Payer: PPO | Admitting: Pulmonary Disease

## 2020-03-08 ENCOUNTER — Ambulatory Visit (INDEPENDENT_AMBULATORY_CARE_PROVIDER_SITE_OTHER): Payer: PPO | Admitting: Pulmonary Disease

## 2020-03-08 VITALS — BP 128/72 | HR 72 | Temp 97.0°F | Ht 71.0 in | Wt 211.6 lb

## 2020-03-08 DIAGNOSIS — U099 Post covid-19 condition, unspecified: Secondary | ICD-10-CM

## 2020-03-08 LAB — PULMONARY FUNCTION TEST
DL/VA % pred: 105 %
DL/VA: 4.29 ml/min/mmHg/L
DLCO cor % pred: 59 %
DLCO cor: 16.14 ml/min/mmHg
DLCO unc % pred: 59 %
DLCO unc: 16.14 ml/min/mmHg
FEF 25-75 Post: 3.62 L/sec
FEF 25-75 Pre: 3.85 L/sec
FEF2575-%Change-Post: -6 %
FEF2575-%Pred-Post: 138 %
FEF2575-%Pred-Pre: 147 %
FEV1-%Change-Post: -1 %
FEV1-%Pred-Post: 72 %
FEV1-%Pred-Pre: 73 %
FEV1-Post: 2.47 L
FEV1-Pre: 2.51 L
FEV1FVC-%Change-Post: 0 %
FEV1FVC-%Pred-Pre: 119 %
FEV6-%Change-Post: -2 %
FEV6-%Pred-Post: 63 %
FEV6-%Pred-Pre: 65 %
FEV6-Post: 2.8 L
FEV6-Pre: 2.86 L
FEV6FVC-%Pred-Post: 105 %
FEV6FVC-%Pred-Pre: 105 %
FVC-%Change-Post: -2 %
FVC-%Pred-Post: 60 %
FVC-%Pred-Pre: 61 %
FVC-Post: 2.8 L
FVC-Pre: 2.86 L
Post FEV1/FVC ratio: 88 %
Post FEV6/FVC ratio: 100 %
Pre FEV1/FVC ratio: 88 %
Pre FEV6/FVC Ratio: 100 %
RV % pred: 47 %
RV: 1.17 L
TLC % pred: 57 %
TLC: 4.17 L

## 2020-03-08 NOTE — Progress Notes (Addendum)
Taylor [name]    [id number]    04/22/1950  Primary Care Physician:Tisovec, Fransico Him, MD  Referring Physician: Haywood Pao, MD [address] [place],  [place] [address]  Chief complaint: Follow-up for post Covid  HPI: 70 year old with hypertension, hyperlipidemia, coronary artery disease, ischemic cardiomyopathy admitted with COVID-19 pneumonia on 11/12/2019. He was treated with high flow nasal cannula, remdesivir, steroids and baricitinib. He received 15 days of steroid taper post discharge and was sent home on 3 L oxygen. He has weaned himself off oxygen during the daytime and continues on oxygen at night Overall his major slow recovery. Still has some persistent dyspnea on exertion.  Pets: Has a dog Occupation: Retired Agricultural consultant Exposures: No known exposures. No mold, hot tub, Jacuzzi Smoking history: Non-smoker Travel history: No significant travel history Relevant family history: No significant family history of lung disease  Interim Hx: Weaned off supplemental oxygen. Denies any residual dyspnea on exertion. Endorses lower extremity deconditioning.  Outpatient Encounter Medications as of 03/08/2020  Medication Sig  . ascorbic acid (VITAMIN C) 500 MG tablet Take 1 tablet (500 mg total) by mouth daily.  Marland Kitchen aspirin EC 81 MG tablet Take 1 tablet (81 mg total) by mouth daily.  Marland Kitchen atorvastatin (LIPITOR) 80 MG tablet Take 1 tablet (80 mg total) by mouth daily at 6 PM.  . insulin glargine (LANTUS) 100 UNIT/ML injection Inject 15 Units into the skin daily.  Marland Kitchen JARDIANCE 25 MG TABS tablet Take 25 mg by mouth daily.  Marland Kitchen lisinopril (ZESTRIL) 10 MG tablet Take 10 mg by mouth daily.  . metFORMIN (GLUCOPHAGE) 500 MG tablet Take 500 mg by mouth 2 (two) times daily.  . metoprolol succinate (TOPROL-XL) 25 MG 24 hr tablet TAKE 1 TABLET BY MOUTH TWICE DAILY  . Multiple Vitamin (MULTIVITAMIN) tablet Take 1 tablet by mouth daily.  . TRULICITY 1.5 SW/9.6PR SOPN Inject 1.5 mg into  the skin once a week. Take every Sunday  . [DISCONTINUED] albuterol (VENTOLIN HFA) 108 (90 Base) MCG/ACT inhaler Inhale 2 puffs into the lungs every 6 (six) hours as needed for wheezing or shortness of breath. (Patient not taking: Reported on 01/07/2020)   No facility-administered encounter medications on file as of 03/08/2020.    Allergies as of 03/08/2020  . (No Known Allergies)    Past Medical History:  Diagnosis Date  . Bigeminal rhythm 08/01/2017   per EKG 07-24-2017 in epic (per confirmation new since last ecg in 2007)  in care everywhere in epic ecg dated 2012 shows sinus brady rate 53 no pvc's --per pt asymptomatic and   . Bradycardia   . Coronary artery disease involving native coronary artery 08/2017   Abnormal Myoview 08/2017 (Abnormal EKG - Severe ST depressions, PVCs & nsVT). TID noted.  Moderate LAD territory Ischemic Perfusion defect --> Cath with extensive RCA (RPDA & RPAV), Cx & LAD disease --> CABG x 4  . Family history of premature coronary artery disease    Father - MI 22. Paternal uncle x 2 -1 with MI & other with CABG in 64s  . History of contusion    ED visit 08-04-2013 , 9 days past injury ,  dx concussion w/ LOC, per CT inferior frontal lobes contusion hemarrhage-- residual headache (08-01-2017  per pt residual resolved)  . History of kidney stones   . Hypercholesterolemia   . Hypertension   . Nephrolithiasis    bilateral nonobstructive per CT 07-24-2017  . S/P CABG x 4 09/06/2017  LIMA to LAD, SVG to OM2, Sequential SVG to PDA and RPL2, EVH via right thigh and leg  . Type 2 diabetes mellitus ([place])    followed by pcp  . Ureteral calculi    left  . Wears glasses   . Wears hearing aid in both ears     Past Surgical History:  Procedure Laterality Date  . CORONARY ARTERY BYPASS GRAFT N/A 09/06/2017   Procedure: CORONARY ARTERY BYPASS GRAFTING (CABG) x 4, using left internal mammary artery and right leg greater saphenous vein harvested endoscopically;  Surgeon:  Rexene Alberts, MD;  Location: [facility];  Service: Open Heart Surgery;; LIMA-LAD, SVG-OM2, SeqSVG-RPDA-RPL2  . CYSTOSCOPY WITH RETROGRADE PYELOGRAM, URETEROSCOPY AND STENT PLACEMENT Left 08/02/2018   Procedure: CYSTOSCOPY WITH RETROGRADE PYELOGRAM, URETEROSCOPY AND STENT PLACEMENT;  Surgeon: Cleon Gustin, MD;  [facility]: [facility];  Service: Urology;  Laterality: Left;  . CYSTOSCOPY/RETROGRADE/URETEROSCOPY Left 08/06/2017   Procedure: CYSTOSCOPY/RETROGRADE/URETEROSCOPY laser and stone basket extrection;  Surgeon: Cleon Gustin, MD;  Location: [facility];  Service: Urology;  Laterality: Left;  . HOLMIUM LASER APPLICATION Left 05/22/8117   Procedure: HOLMIUM LASER APPLICATION;  Surgeon: Cleon Gustin, MD;  [facility]: [facility];  Service: Urology;  Laterality: Left;  . LAPAROSCOPIC APPENDECTOMY  11-18-2005    dr wyatt  [facility]  . LEFT HEART CATH AND CORONARY ANGIOGRAPHY N/A 09/03/2017   Procedure: LEFT HEART CATH AND CORONARY ANGIOGRAPHY;  Surgeon: Leonie Man, MD;  Location: [facility];  Service: Cardiovascular;; mRCA 70%, dRCA 99%-95% into RPAV & 90% ostRPDA 95%. RPL1 70%, distal RVAM (tandem PDA) - 85%. Ost-prox LAD 40% -pLAD 60&90% @ D1 ->p-mLAD 95% @ D2 with 70% D2. pCx 80%, p-mCx 90%-> mCx 85%.  EF 45-50%, global HK. -->> referred for CABG  . NASAL ENDOSCOPY WITH EPISTAXIS CONTROL N/A 11/29/2017   Procedure: NASAL ENDOSCOPY WITH EPISTAXIS CONTROL;  Surgeon: Leta Baptist, MD;  Location: [facility];  Service: ENT;  Laterality: N/A;  . NM MYOVIEW LTD  08/31/2017   HIGH RISK: Combination Imaging & EKG:  with  in tmedium sized, moderate severity Reversible perfusion e apdefect in  the mid and apical anterior, apical lateral walls - FINDINGS ARE C/W ISCHEMIA in the LAD territory,  however given significant arrhythmias, severe ST depressions and TID =  MV CAD is highly likely (SDS =7).   . ORCHIECTOMY  1965   unilateral -- per pt ruptured  . TEE WITHOUT  CARDIOVERSION N/A 09/06/2017   Procedure: TRANSESOPHAGEAL ECHOCARDIOGRAM (TEE);  INTRA-OP.  Surgeon: Rexene Alberts, MD;  Location: [facility] OR;  Service: Open Heart Surgery:;; Normal AoV, MV & TV.  Normal RV.  Marland Kitchen TRANSTHORACIC ECHOCARDIOGRAM  09/04/2017   (pre-CABG): EF 40-45%, diffuse HK.  Difficult to assess due to frequent PVCs. Severe LA dilation. Mildly increased PAP.  Marland Kitchen TRANSTHORACIC ECHOCARDIOGRAM  01/01/2018   Post CABG: EF moderately reduced 40 and 45%.  Diffuse HK.  No identifiable R WMA.  GRII DD with severe LA dilation.  (High filling pressures).  Moderate aortic calcification-aortic sclerosis with no stenosis.  Mildly elevated PA pressures.  Marland Kitchen UMBILICAL HERNIA REPAIR  2006  . VARICOSE VEIN SURGERY  2007   left endovascular laser ablation    Family History  Problem Relation Age of Onset  . Stroke Mother   . Atrial fibrillation Mother   . Clotting disorder Father   . Heart attack Father 62       Died from complications  . Diabetes Daughter   .  Heart disease Other   . Stroke Other   . Heart attack Paternal Uncle        In his 2s  . CAD Paternal Uncle        Has had either bypass surgery or stents    Social History   Socioeconomic History  . Marital status: Married    Spouse name: Not on file  . Number of children: Not on file  . Years of education: Not on file  . Highest education level: Not on file  Occupational History  . Not on file  Tobacco Use  . Smoking status: Never Smoker  . Smokeless tobacco: Never Used  Vaping Use  . Vaping Use: Never used  Substance and Sexual Activity  . Alcohol use: No  . Drug use: No  . Sexual activity: Yes  Other Topics Concern  . Not on file  Social History Narrative   married and lives locally in [place] with his family.     He has 2 adult children and several grandchildren.     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    Social Determinants of Health   Financial  Resource Strain: Not on file  Food Insecurity: Not on file  Transportation Needs: Not on file  Physical Activity: Not on file  Stress: Not on file  Social Connections: Not on file  Intimate Partner Violence: Not on file    Review of systems: Review of Systems  Constitutional: Negative for fever and chills.  HENT: Negative.   Eyes: Negative for blurred vision.  Respiratory: Negative for shortness of breath. Cardiovascular: Negative for chest pain and palpitations.  Gastrointestinal: Negative for vomiting, diarrhea, blood per rectum. Genitourinary: Negative for dysuria, urgency, frequency and hematuria.  Musculoskeletal: Negative for myalgias, back pain and joint pain.  Skin: Negative for itching and rash.  Neurological: Negative for dizziness, tremors, focal weakness, seizures and loss of consciousness.  Endo/Heme/Allergies: Negative for environmental allergies.  Psychiatric/Behavioral: Negative for depression, suicidal ideas and hallucinations.  All other systems reviewed and are negative.  Physical Exam: Blood pressure 128/72, pulse 94, temperature (!) 97.2 F (36.2 C), temperature source Skin, height 5\' 11"  (1.803 m), weight 202 lb 12.8 oz (92 kg), SpO2 96 %. Gen:      No acute distress HEENT:  EOMI, sclera anicteric Neck:     No masses; no thyromegaly Lungs:    Clear to auscultation bilaterally; normal respiratory effort CV:         Regular rate and rhythm; no murmurs Abd:      + bowel sounds; soft, non-tender; no palpable masses, no distension Ext:    No edema; adequate peripheral perfusion Skin:      Warm and dry; no rash Neuro: alert and oriented x 3 Psych: normal mood and affect  Data Reviewed: Imaging: CT abdomen 11/18/2005-visualized lung fields show atelectasis at the base CTA 11/23/2019-no PE, peripheral groundglass opacities and consolidation, lesion in the superior pole of right kidney. CT chest high resolution 02/18/2020- demonstrates evolving postinflammatory  fibrosis due to COVID 19 pneumonia, previously visualized regions of consolidation have resolved. I have reviewed the images personally.   PFTs:  Pulmonary Functions Testing Results:  Component Ref Range & Units 09:16 2 yr ago  FVC-Pre L 2.86  3.91   FVC-%Pred-Pre % 61  83   FVC-Post L 2.80  3.91   FVC-%Pred-Post % 60  83   FVC-%Change-Post % -2  0   FEV1-Pre  L 2.51  3.35   FEV1-%Pred-Pre % 73  96   FEV1-Post L 2.47  3.39   FEV1-%Pred-Post % 72  97   FEV1-%Change-Post % -1  1   FEV6-Pre L 2.86  3.90   FEV6-%Pred-Pre % 65  87   FEV6-Post L 2.80  3.91   FEV6-%Pred-Post % 63  87   FEV6-%Change-Post % -2  0   Pre FEV1/FVC ratio % 88  86   FEV1FVC-%Pred-Pre % 119  115   Post FEV1/FVC ratio % 88  87   FEV1FVC-%Change-Post % 0  1   Pre FEV6/FVC Ratio % 100  100   FEV6FVC-%Pred-Pre % 105  105   Post FEV6/FVC ratio % 100  100   FEV6FVC-%Pred-Post % 105  105   FEF 25-75 Pre L/sec 3.85  4.20   FEF2575-%Pred-Pre % 147  155   FEF 25-75 Post L/sec 3.62  4.33   FEF2575-%Pred-Post % 138  160   FEF2575-%Change-Post % -6  3   RV L 1.17  1.43   RV % pred % 47  58   TLC L 4.17  5.55   TLC % pred % 57  76   DLCO unc ml/min/mmHg 16.14  21.45   DLCO unc % pred % 59  63   DLCO cor ml/min/mmHg 16.14  22.31   DLCO cor % pred % 59  66   DL/VA ml/min/mmHg/L 4.29  4.29   DL/VA % pred % 105  92     Labs:  COVID-19 Labs  No results for input(s): DDIMER, FERRITIN, LDH, CRP in the last 72 hours.  Lab Results  Component Value Date   [place] NEGATIVE 03/04/2020   SARSCOV2NAA NOT DETECTED 07/30/2018    Assessment:  Post Covid pneumonia Weaned off supplemental oxygen about 4-5 weeks ago. Denies any residual dyspnea on exertion. PFT's consistent with restrictive disease due to post-inflammation from COVID 19. High resolution CT showed improvement of previously visualized consolidations with evolving postinflammatory fibrosis due to COVID.   Repeat PFT's in ~6 months and CT in 1  year.   Plan/Recommendations: PFT's in 6 months, high resolution CT in 1 year   Harlow Ohms, DO PGY-2  [facility] 03/08/2020, 10:50 AM  CC: Tisovec, Fransico Him, MD  Attending note: I have seen and examined the patient. History, labs and imaging reviewed. Agree with assessment and plan as noted above  Marshell Garfinkel MD [facility] 03/08/2020, 2:33 PM.

## 2020-03-08 NOTE — Progress Notes (Deleted)
Taylor [name]    [id number]    04/22/1950  Primary Care Physician:Tisovec, Fransico Him, MD  Referring Physician: Haywood Pao, MD [address] [place],  [place] [address]  Chief complaint: Follow-up for post Covid  HPI: 70 year old with hypertension, hyperlipidemia, coronary artery disease, ischemic cardiomyopathy admitted with COVID-19 pneumonia on 11/12/2019. He was treated with high flow nasal cannula, remdesivir, steroids and baricitinib. He received 15 days of steroid taper post discharge and was sent home on 3 L oxygen. He has weaned himself off oxygen during the daytime and continues on oxygen at night Overall his major slow recovery. Still has some persistent dyspnea on exertion.  Pets: Has a dog Occupation: Retired Agricultural consultant Exposures: No known exposures. No mold, hot tub, Jacuzzi Smoking history: Non-smoker Travel history: No significant travel history Relevant family history: No significant family history of lung disease  Interim Hx: Weaned off supplemental oxygen. Denies any residual dyspnea on exertion. Endorses lower extremity deconditioning.  Outpatient Encounter Medications as of 03/08/2020  Medication Sig  . ascorbic acid (VITAMIN C) 500 MG tablet Take 1 tablet (500 mg total) by mouth daily.  Marland Kitchen aspirin EC 81 MG tablet Take 1 tablet (81 mg total) by mouth daily.  Marland Kitchen atorvastatin (LIPITOR) 80 MG tablet Take 1 tablet (80 mg total) by mouth daily at 6 PM.  . insulin glargine (LANTUS) 100 UNIT/ML injection Inject 15 Units into the skin daily.  Marland Kitchen JARDIANCE 25 MG TABS tablet Take 25 mg by mouth daily.  Marland Kitchen lisinopril (ZESTRIL) 10 MG tablet Take 10 mg by mouth daily.  . metFORMIN (GLUCOPHAGE) 500 MG tablet Take 500 mg by mouth 2 (two) times daily.  . metoprolol succinate (TOPROL-XL) 25 MG 24 hr tablet TAKE 1 TABLET BY MOUTH TWICE DAILY  . Multiple Vitamin (MULTIVITAMIN) tablet Take 1 tablet by mouth daily.  . TRULICITY 1.5 SW/9.6PR SOPN Inject 1.5 mg into  the skin once a week. Take every Sunday  . [DISCONTINUED] albuterol (VENTOLIN HFA) 108 (90 Base) MCG/ACT inhaler Inhale 2 puffs into the lungs every 6 (six) hours as needed for wheezing or shortness of breath. (Patient not taking: Reported on 01/07/2020)   No facility-administered encounter medications on file as of 03/08/2020.    Allergies as of 03/08/2020  . (No Known Allergies)    Past Medical History:  Diagnosis Date  . Bigeminal rhythm 08/01/2017   per EKG 07-24-2017 in epic (per confirmation new since last ecg in 2007)  in care everywhere in epic ecg dated 2012 shows sinus brady rate 53 no pvc's --per pt asymptomatic and   . Bradycardia   . Coronary artery disease involving native coronary artery 08/2017   Abnormal Myoview 08/2017 (Abnormal EKG - Severe ST depressions, PVCs & nsVT). TID noted.  Moderate LAD territory Ischemic Perfusion defect --> Cath with extensive RCA (RPDA & RPAV), Cx & LAD disease --> CABG x 4  . Family history of premature coronary artery disease    Father - MI 22. Paternal uncle x 2 -1 with MI & other with CABG in 64s  . History of contusion    ED visit 08-04-2013 , 9 days past injury ,  dx concussion w/ LOC, per CT inferior frontal lobes contusion hemarrhage-- residual headache (08-01-2017  per pt residual resolved)  . History of kidney stones   . Hypercholesterolemia   . Hypertension   . Nephrolithiasis    bilateral nonobstructive per CT 07-24-2017  . S/P CABG x 4 09/06/2017  LIMA to LAD, SVG to OM2, Sequential SVG to PDA and RPL2, EVH via right thigh and leg  . Type 2 diabetes mellitus ([facility])    followed by pcp  . Ureteral calculi    left  . Wears glasses   . Wears hearing aid in both ears     Past Surgical History:  Procedure Laterality Date  . CORONARY ARTERY BYPASS GRAFT N/A 09/06/2017   Procedure: CORONARY ARTERY BYPASS GRAFTING (CABG) x 4, using left internal mammary artery and right leg greater saphenous vein harvested endoscopically;  Surgeon:  Rexene Alberts, MD;  Location: [facility];  Service: Open Heart Surgery;; LIMA-LAD, SVG-OM2, SeqSVG-RPDA-RPL2  . CYSTOSCOPY WITH RETROGRADE PYELOGRAM, URETEROSCOPY AND STENT PLACEMENT Left 08/02/2018   Procedure: CYSTOSCOPY WITH RETROGRADE PYELOGRAM, URETEROSCOPY AND STENT PLACEMENT;  Surgeon: Cleon Gustin, MD;  Location: [facility];  Service: Urology;  Laterality: Left;  . CYSTOSCOPY/RETROGRADE/URETEROSCOPY Left 08/06/2017   Procedure: CYSTOSCOPY/RETROGRADE/URETEROSCOPY laser and stone basket extrection;  Surgeon: Cleon Gustin, MD;  Location: [facility];  Service: Urology;  Laterality: Left;  . HOLMIUM LASER APPLICATION Left 05/22/8117   Procedure: HOLMIUM LASER APPLICATION;  Surgeon: Cleon Gustin, MD;  Location: [facility];  Service: Urology;  Laterality: Left;  . LAPAROSCOPIC APPENDECTOMY  11-18-2005    dr wyatt  [facility]  . LEFT HEART CATH AND CORONARY ANGIOGRAPHY N/A 09/03/2017   Procedure: LEFT HEART CATH AND CORONARY ANGIOGRAPHY;  Surgeon: Leonie Man, MD;  Location: [facility];  Service: Cardiovascular;; mRCA 70%, dRCA 99%-95% into RPAV & 90% ostRPDA 95%. RPL1 70%, distal RVAM (tandem PDA) - 85%. Ost-prox LAD 40% -pLAD 60&90% @ D1 ->p-mLAD 95% @ D2 with 70% D2. pCx 80%, p-mCx 90%-> mCx 85%.  EF 45-50%, global HK. -->> referred for CABG  . NASAL ENDOSCOPY WITH EPISTAXIS CONTROL N/A 11/29/2017   Procedure: NASAL ENDOSCOPY WITH EPISTAXIS CONTROL;  Surgeon: Leta Baptist, MD;  Location: [facility];  Service: ENT;  Laterality: N/A;  . NM MYOVIEW LTD  08/31/2017   HIGH RISK: Combination Imaging & EKG:  with  in tmedium sized, moderate severity Reversible perfusion e apdefect in  the mid and apical anterior, apical lateral walls - FINDINGS ARE C/W ISCHEMIA in the LAD territory,  however given significant arrhythmias, severe ST depressions and TID =  MV CAD is highly likely (SDS =7).   . ORCHIECTOMY  1965   unilateral -- per pt ruptured  . TEE WITHOUT  CARDIOVERSION N/A 09/06/2017   Procedure: TRANSESOPHAGEAL ECHOCARDIOGRAM (TEE);  INTRA-OP.  Surgeon: Rexene Alberts, MD;  Location: [facility] OR;  Service: Open Heart Surgery:;; Normal AoV, MV & TV.  Normal RV.  Marland Kitchen TRANSTHORACIC ECHOCARDIOGRAM  09/04/2017   (pre-CABG): EF 40-45%, diffuse HK.  Difficult to assess due to frequent PVCs. Severe LA dilation. Mildly increased PAP.  Marland Kitchen TRANSTHORACIC ECHOCARDIOGRAM  01/01/2018   Post CABG: EF moderately reduced 40 and 45%.  Diffuse HK.  No identifiable R WMA.  GRII DD with severe LA dilation.  (High filling pressures).  Moderate aortic calcification-aortic sclerosis with no stenosis.  Mildly elevated PA pressures.  Marland Kitchen UMBILICAL HERNIA REPAIR  2006  . VARICOSE VEIN SURGERY  2007   left endovascular laser ablation    Family History  Problem Relation Age of Onset  . Stroke Mother   . Atrial fibrillation Mother   . Clotting disorder Father   . Heart attack Father 62       Died from complications  . Diabetes Daughter   .  Heart disease Other   . Stroke Other   . Heart attack Paternal Uncle        In his 18s  . CAD Paternal Uncle        Has had either bypass surgery or stents    Social History   Socioeconomic History  . Marital status: Married    Spouse name: Not on file  . Number of children: Not on file  . Years of education: Not on file  . Highest education level: Not on file  Occupational History  . Not on file  Tobacco Use  . Smoking status: Never Smoker  . Smokeless tobacco: Never Used  Vaping Use  . Vaping Use: Never used  Substance and Sexual Activity  . Alcohol use: No  . Drug use: No  . Sexual activity: Yes  Other Topics Concern  . Not on file  Social History Narrative   married and lives locally in [place] with his family.     He has 2 adult children and several grandchildren.     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    Social Determinants of Health   Financial  Resource Strain: Not on file  Food Insecurity: Not on file  Transportation Needs: Not on file  Physical Activity: Not on file  Stress: Not on file  Social Connections: Not on file  Intimate Partner Violence: Not on file    Review of systems: Review of Systems  Constitutional: Negative for fever and chills.  HENT: Negative.   Eyes: Negative for blurred vision.  Respiratory: Negative for shortness of breath. Cardiovascular: Negative for chest pain and palpitations.  Gastrointestinal: Negative for vomiting, diarrhea, blood per rectum. Genitourinary: Negative for dysuria, urgency, frequency and hematuria.  Musculoskeletal: Negative for myalgias, back pain and joint pain.  Skin: Negative for itching and rash.  Neurological: Negative for dizziness, tremors, focal weakness, seizures and loss of consciousness.  Endo/Heme/Allergies: Negative for environmental allergies.  Psychiatric/Behavioral: Negative for depression, suicidal ideas and hallucinations.  All other systems reviewed and are negative.  Physical Exam: Blood pressure 128/72, pulse 94, temperature (!) 97.2 F (36.2 C), temperature source Skin, height 5\' 11"  (1.803 m), weight 202 lb 12.8 oz (92 kg), SpO2 96 %. Gen:      No acute distress HEENT:  EOMI, sclera anicteric Neck:     No masses; no thyromegaly Lungs:    Clear to auscultation bilaterally; normal respiratory effort CV:         Regular rate and rhythm; no murmurs Abd:      + bowel sounds; soft, non-tender; no palpable masses, no distension Ext:    No edema; adequate peripheral perfusion Skin:      Warm and dry; no rash Neuro: alert and oriented x 3 Psych: normal mood and affect  Data Reviewed: Imaging: CT abdomen 11/18/2005-visualized lung fields show atelectasis at the base CTA 11/23/2019-no PE, peripheral groundglass opacities and consolidation, lesion in the superior pole of right kidney. CT chest high resolution 02/18/2020- demonstrates evolving postinflammatory  fibrosis due to COVID 19 pneumonia, previously visualized regions of consolidation have resolved. I have reviewed the images personally.   PFTs:  Pulmonary Functions Testing Results:  Component Ref Range & Units 09:16 2 yr ago  FVC-Pre L 2.86  3.91   FVC-%Pred-Pre % 61  83   FVC-Post L 2.80  3.91   FVC-%Pred-Post % 60  83   FVC-%Change-Post % -2  0   FEV1-Pre  L 2.51  3.35   FEV1-%Pred-Pre % 73  96   FEV1-Post L 2.47  3.39   FEV1-%Pred-Post % 72  97   FEV1-%Change-Post % -1  1   FEV6-Pre L 2.86  3.90   FEV6-%Pred-Pre % 65  87   FEV6-Post L 2.80  3.91   FEV6-%Pred-Post % 63  87   FEV6-%Change-Post % -2  0   Pre FEV1/FVC ratio % 88  86   FEV1FVC-%Pred-Pre % 119  115   Post FEV1/FVC ratio % 88  87   FEV1FVC-%Change-Post % 0  1   Pre FEV6/FVC Ratio % 100  100   FEV6FVC-%Pred-Pre % 105  105   Post FEV6/FVC ratio % 100  100   FEV6FVC-%Pred-Post % 105  105   FEF 25-75 Pre L/sec 3.85  4.20   FEF2575-%Pred-Pre % 147  155   FEF 25-75 Post L/sec 3.62  4.33   FEF2575-%Pred-Post % 138  160   FEF2575-%Change-Post % -6  3   RV L 1.17  1.43   RV % pred % 47  58   TLC L 4.17  5.55   TLC % pred % 57  76   DLCO unc ml/min/mmHg 16.14  21.45   DLCO unc % pred % 59  63   DLCO cor ml/min/mmHg 16.14  22.31   DLCO cor % pred % 59  66   DL/VA ml/min/mmHg/L 4.29  4.29   DL/VA % pred % 105  92     Labs:  COVID-19 Labs  No results for input(s): DDIMER, FERRITIN, LDH, CRP in the last 72 hours.  Lab Results  Component Value Date   Ethel NEGATIVE 03/04/2020   SARSCOV2NAA NOT DETECTED 07/30/2018    Assessment:  Post Covid pneumonia Weaned off supplemental oxygen about 4-5 weeks ago. Denies any residual dyspnea on exertion. PFT's consistent with restrictive disease due to post-inflammation from COVID 19. High resolution CT showed improvement of previously visualized consolidations with evolving postinflammatory fibrosis due to COVID.   Repeat PFT's in ~6 months and CT in 1  year.   Plan/Recommendations: PFT's in 6 months, high resolution CT in 1 year   Harlow Ohms, DO PGY-2  [facility] 03/08/2020, 2:36 PM  CC: Tisovec, Fransico Him, MD  Attending note: I have seen and examined the patient. History, labs and imaging reviewed. Agree with assessment and plan  Marshell Garfinkel MD  Pulmonary and Critical Care 03/08/2020, 2:33 PM

## 2020-03-08 NOTE — Progress Notes (Signed)
Full PFT completed today ? ?

## 2020-03-08 NOTE — Patient Instructions (Signed)
I am glad you are feeling well and improving You can start an exercise regimen at home and return to work at [employer] and PFTs show some scarring from COVID-19.  We will continue to monitor this  Follow-up in 6 months

## 2020-03-09 NOTE — Progress Notes (Signed)
Attending note: I have seen and examined the patient. History, labs and imaging reviewed. Agree with assessment and plan as recorded by Dr. Letitia Libra MD [facility] 03/09/2020, 5:32 AM

## 2020-03-11 DIAGNOSIS — E1129 Type 2 diabetes mellitus with other diabetic kidney complication: Secondary | ICD-10-CM | POA: Diagnosis not present

## 2020-03-11 DIAGNOSIS — N1832 Chronic kidney disease, stage 3b: Secondary | ICD-10-CM | POA: Diagnosis not present

## 2020-03-11 DIAGNOSIS — I130 Hypertensive heart and chronic kidney disease with heart failure and stage 1 through stage 4 chronic kidney disease, or unspecified chronic kidney disease: Secondary | ICD-10-CM | POA: Diagnosis not present

## 2020-03-16 DIAGNOSIS — L218 Other seborrheic dermatitis: Secondary | ICD-10-CM | POA: Diagnosis not present

## 2020-03-16 DIAGNOSIS — L819 Disorder of pigmentation, unspecified: Secondary | ICD-10-CM | POA: Diagnosis not present

## 2020-03-16 DIAGNOSIS — L570 Actinic keratosis: Secondary | ICD-10-CM | POA: Diagnosis not present

## 2020-03-16 DIAGNOSIS — L578 Other skin changes due to chronic exposure to nonionizing radiation: Secondary | ICD-10-CM | POA: Diagnosis not present

## 2020-03-16 DIAGNOSIS — L821 Other seborrheic keratosis: Secondary | ICD-10-CM | POA: Diagnosis not present

## 2020-03-16 DIAGNOSIS — D229 Melanocytic nevi, unspecified: Secondary | ICD-10-CM | POA: Diagnosis not present

## 2020-03-16 DIAGNOSIS — L814 Other melanin hyperpigmentation: Secondary | ICD-10-CM | POA: Diagnosis not present

## 2020-03-18 ENCOUNTER — Ambulatory Visit: Payer: PPO | Admitting: Cardiology

## 2020-03-18 ENCOUNTER — Encounter: Payer: Self-pay | Admitting: Cardiology

## 2020-03-18 ENCOUNTER — Other Ambulatory Visit: Payer: Self-pay

## 2020-03-18 VITALS — BP 136/76 | HR 77 | Ht 71.0 in | Wt 214.4 lb

## 2020-03-18 DIAGNOSIS — Z951 Presence of aortocoronary bypass graft: Secondary | ICD-10-CM | POA: Diagnosis not present

## 2020-03-18 DIAGNOSIS — I255 Ischemic cardiomyopathy: Secondary | ICD-10-CM | POA: Diagnosis not present

## 2020-03-18 DIAGNOSIS — E785 Hyperlipidemia, unspecified: Secondary | ICD-10-CM | POA: Diagnosis not present

## 2020-03-18 DIAGNOSIS — I471 Supraventricular tachycardia: Secondary | ICD-10-CM | POA: Diagnosis not present

## 2020-03-18 DIAGNOSIS — I493 Ventricular premature depolarization: Secondary | ICD-10-CM

## 2020-03-18 DIAGNOSIS — I10 Essential (primary) hypertension: Secondary | ICD-10-CM

## 2020-03-18 DIAGNOSIS — I498 Other specified cardiac arrhythmias: Secondary | ICD-10-CM | POA: Diagnosis not present

## 2020-03-18 DIAGNOSIS — I2510 Atherosclerotic heart disease of native coronary artery without angina pectoris: Secondary | ICD-10-CM

## 2020-03-18 DIAGNOSIS — E1169 Type 2 diabetes mellitus with other specified complication: Secondary | ICD-10-CM

## 2020-03-18 NOTE — Patient Instructions (Signed)
Medication Instructions:  No chnages  *If you need a refill on your cardiac medications before your next appointment, please call your pharmacy*   Lab Work:  Not needed   Testing/Procedures:  Not needed  Follow-Up: At [facility], you and your health needs are our priority.  As part of our continuing mission to provide you with exceptional heart care, we have created designated Provider Care Teams.  These Care Teams include your primary Cardiologist (physician) and Advanced Practice Providers (APPs -  Physician Assistants and Nurse Practitioners) who all work together to provide you with the care you need, when you need it.     Your next appointment:   5 to 6 month(s)  The format for your next appointment:   In Person  Provider:   [name], MD

## 2020-03-18 NOTE — Progress Notes (Signed)
Primary Care Provider: Haywood Pao, MD Cardiologist: Taylor Hew, MD Electrophysiologist: Taylor Meredith Leeds, MD  Clinic Note: Chief Complaint  Patient presents with  . Follow-up    Doing well. Recovering from COVID-19 infection  . Coronary Artery Disease    No angina  . Atrial Fibrillation    Had 1 episode of fast heart rate 160 bpm lasting 10 minutes.   ===================================  ASSESSMENT/PLAN   Problem List Items Addressed This Visit    Ventricular bigeminy - Primary (Chronic)    Remains asymptomatic. On stable dose of beta-blocker-taking Toprol 25 mg twice daily.      Coronary artery disease involving native coronary artery of native heart without angina pectoris (Chronic)    Taylor [name] had remarkably minimal symptoms with significant CAD. Now status post CABG. Still not having any symptoms. Taylor [name] is recovering slowly but doing okay post Covid. Has reduced EF but no significant heart failure. No recurrent angina..  Plan:  Continue aspirin and high-dose statin  Continue 30 mg twice daily Toprol for now, but low threshold to increase dose based on recent rapid heart rate.  Continue current dose of lisinopril  On combination of Jardiance and Trulicity along with Metformin.      Ischemic cardiomyopathy - pre-CABG ECHO: 40-45% (Chronic)    Compensating well. No CHF symptoms. Euvolemic on exam. No diuretic requirement while on Jardiance. Doing relatively well on combination of lisinopril and current dose of Toprol.      Hyperlipidemia associated with type 2 diabetes mellitus (HCC) (Chronic)    LDL looks great on current dose of atorvastatin. Taylor [name] seems to be tolerating it fine without any major myalgias. Continue for now. If Taylor [name] were to start having symptoms, would probably switch to rosuvastatin.  None excellent cardiovascular regimen for diabetes with Metformin, sitagliptin (SGLT2 inhibitor) and dulaglutide (GLP-1 agonist). His hemoglobin was actually stable  at 7.7 this in September 2021 (had been the same in November 2020) => may require more aggressive management per PCP.      PSVT (paroxysmal supraventricular tachycardia) (HCC) (Chronic)    Difficult to tell whether this is PSVT or potentially some other arrhythmia. The fact that lasted short-lived and was very irregular, fast would suggest probable AVNRT.  Relatively well controlled on twice daily Toprol. I told that Taylor [name] can take an extra dose if Taylor [name] has further episodes. We discussed vagal maneuvers. If you were to have more frequent episodes, may consider further testing or treatment. Try to avoid antiarrhythmic. => Is known to EP. If Taylor [name] were to have more frequent episodes, Taylor refer back.      Essential hypertension (Chronic)    Blood pressure borderline today. Taylor [name] has not yet taken his medications, they can explain part of it. Taylor [name] does have room to increase lisinopril if necessary along with current dose of Toprol.  I am reluctant to titrate Toprol any further because of potential fatigue.      S/P CABG x 4    2 and half years out from his CABG. Based on the fact Taylor [name] was relatively asymptomatic given significant CAD, Taylor plan ischemic evaluation in the fall 2023        ===================================  HPI:    Taylor [name] is a 70 y.o. male with a PMH notable for CAD-CABG (diagnosed because of initial evaluation of frequent PVCs followed by progressive angina), HTN, HLD who presents today for 26-month follow-up.   June 2019:Haroldwas initially seen for ventricular bigeminy/bradycardia and a strong family history of CAD.  -->  Taylor [name] noted some significant exertional dyspnea and chest tightness.->High Risk Myoview with anterior ischemia--Cath w/ MV CAD--> CABGx4 -  LIMA to LAD, SVG to OM2, Sequential SVG to PDA and RPL2 (July 19). ->Postop monitor showed PSVT (initially considered to be A. Fib->EP evaluation suggested AVNRT.) Monitor was ordered for follow-up of preop  ventricular bigeminy  Taylor [name] was last seen on Jun 18, 2019 via telemedicine.  Generally well with no active symptoms.  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 previously going to court house for his wife.  Taylor [name] is walking at least 14,000 steps a day.  No cardiac symptoms of chest tightness pressure dyspnea.  Noted intermittent tachycardia spells with heart rates in the 120s, usually self-limited, and not associated with any dizziness or wooziness.  No syncope or near syncope..  Had just recovered from a GI bug-admitted delayed getting the COVID-19 vaccination is positive.  Was hoping to get [employer]  No med changes  Recent Hospitalizations:   Tested positive for Covid on 11/06/2019.  11/12/2019-11/24/2019: Admitted following MAB infusion  - from infection.  (not vaccinated) Presented with hypoxia with nonproductive cough.    Became hypoxic during the IV infusion.  Also noted continued fevers myalgias and diarrhea.  Converted to IV steroids treated with baricitinib.  Echo showed improved EF.    Reviewed  CV studies:    The following studies were reviewed today: (if available, images/films reviewed: From Epic Chart or Care Everywhere) . Echo 11/17/2019: EF 50 to 55%.  No R WMA.  Mild G1 DD.  Moderate LA dilation.  Mild-moderate aortic valve sclerosis no stenosis.  Interval History:   Taylor [name] returns here today overall doing pretty well.  Taylor [name] has had a pretty slow progression since his hospitalization in October for [place].  Taylor [name] is due to get his PFTs rechecked but the imaging still shows scarring.  Thankfully, Taylor [name] is not having any chest pain or pressure.  Oxygen levels are getting better and his breathing is better.  Taylor [name] had lost about 30 pounds during his Covid hospitalization, but has gained about 20 pounds back  Taylor [name] had an episode about 3 weeks ago that lasted 10 minutes or so where Taylor [name] felt his heart rate go  up in the 160s-his smart watch indicated that it was 167 bpm.  Taylor [name] denied any chest pain or dizziness associated with it.  Taylor [name] just felt funny.  It was like a strange fluttering sensation in his chest that lasted longer than Taylor [name] is used to lasting.  Taylor [name] had not yet taken his beta-blocker that day so Taylor [name] took his dose a little early and it broke relatively quickly.  CV Review of Symptoms (Summary): no chest pain or dyspnea on exertion positive for - rapid heart rate and lasted 10 min - associated with dyspnea - no dizziness / CP negative for - dyspnea on exertion, edema, orthopnea, paroxysmal nocturnal dyspnea, shortness of breath or syncope/near syncope, TIA/ amaurosis fugasx, claudication   Taylor [name] was apprised that his blood pressure today, Taylor [name] says usually they are in the 120s to 130s /70s but this is on the high end.  Taylor [name] has not yet taken his blood pressure medication this morning and thinks that may be the reason.  The patient does not have symptoms concerning for COVID-19 infection (fever, chills, cough, or new shortness of breath).   REVIEWED OF SYSTEMS   Review of Systems  Constitutional:  Negative for malaise/fatigue.  HENT: Negative for congestion and sinus pain.   Respiratory: Negative for cough, sputum production and shortness of breath.   Cardiovascular:       Per HPI  Gastrointestinal: Negative for blood in stool and melena.  Genitourinary: Negative for hematuria.  Musculoskeletal: Negative for falls, joint pain and myalgias.  Neurological: Negative for dizziness, focal weakness and weakness.  Psychiatric/Behavioral: Negative for depression and memory loss. The patient is not nervous/anxious and does not have insomnia.    I have reviewed and (if needed) personally updated the patient's problem list, medications, allergies, past medical and surgical history, social and family history.   PAST MEDICAL HISTORY   Past Medical History:  Diagnosis Date  . Bigeminal rhythm 08/01/2017   per  EKG 07-24-2017 in epic (per confirmation new since last ecg in 2007)  in care everywhere in epic ecg dated 2012 shows sinus brady rate 53 no pvc's --per pt asymptomatic and   . Bradycardia   . Coronary artery disease involving native coronary artery 08/2017   Abnormal Myoview 08/2017 (Abnormal EKG - Severe ST depressions, PVCs & nsVT). TID noted.  Moderate LAD territory Ischemic Perfusion defect --> Cath with extensive RCA (RPDA & RPAV), Cx & LAD disease --> CABG x 4  . Family history of premature coronary artery disease    Father - MI 71. Paternal uncle x 2 -1 with MI & other with CABG in 26s  . History of contusion    ED visit 08-04-2013 , 9 days past injury ,  dx concussion w/ LOC, per CT inferior frontal lobes contusion hemarrhage-- residual headache (08-01-2017  per pt residual resolved)  . History of kidney stones   . Hypercholesterolemia   . Hypertension   . Nephrolithiasis    bilateral nonobstructive per CT 07-24-2017  . S/P CABG x 4 09/06/2017   LIMA to LAD, SVG to OM2, Sequential SVG to PDA and RPL2, EVH via right thigh and leg  . Type 2 diabetes mellitus ([place])    followed by pcp  . Ureteral calculi    left  . Wears glasses   . Wears hearing aid in both ears     PAST SURGICAL HISTORY   Past Surgical History:  Procedure Laterality Date  . CORONARY ARTERY BYPASS GRAFT N/A 09/06/2017   Procedure: CORONARY ARTERY BYPASS GRAFTING (CABG) x 4, using left internal mammary artery and right leg greater saphenous vein harvested endoscopically;  Surgeon: Rexene Alberts, MD;  Location: [facility];  Service: Open Heart Surgery;; LIMA-LAD, SVG-OM2, SeqSVG-RPDA-RPL2  . CYSTOSCOPY WITH RETROGRADE PYELOGRAM, URETEROSCOPY AND STENT PLACEMENT Left 08/02/2018   Procedure: CYSTOSCOPY WITH RETROGRADE PYELOGRAM, URETEROSCOPY AND STENT PLACEMENT;  Surgeon: Cleon Gustin, MD;  Location: [facility];  Service: Urology;  Laterality: Left;  . CYSTOSCOPY/RETROGRADE/URETEROSCOPY Left 08/06/2017   Procedure:  CYSTOSCOPY/RETROGRADE/URETEROSCOPY laser and stone basket extrection;  Surgeon: Cleon Gustin, MD;  Location: [facility];  Service: Urology;  Laterality: Left;  . HOLMIUM LASER APPLICATION Left [id number]   Procedure: HOLMIUM LASER APPLICATION;  Surgeon: Cleon Gustin, MD;  Location: [facility];  Service: Urology;  Laterality: Left;  . LAPAROSCOPIC APPENDECTOMY  11-18-2005    dr wyatt  [facility]  . LEFT HEART CATH AND CORONARY ANGIOGRAPHY N/A 09/03/2017   Procedure: LEFT HEART CATH AND CORONARY ANGIOGRAPHY;  Surgeon: Leonie Man, MD;  Location: [facility];  Service: Cardiovascular;; mRCA 70%, dRCA 99%-95% into RPAV & 90% ostRPDA 95%. RPL1 70%, distal RVAM (tandem PDA) -  85%. Ost-prox LAD 40% -pLAD 60&90% @ D1 ->p-mLAD 95% @ D2 with 70% D2. pCx 80%, p-mCx 90%-> mCx 85%.  EF 45-50%, global HK. -->> referred for CABG  . NASAL ENDOSCOPY WITH EPISTAXIS CONTROL N/A 11/29/2017   Procedure: NASAL ENDOSCOPY WITH EPISTAXIS CONTROL;  Surgeon: Leta Baptist, MD;  Location: [facility];  Service: ENT;  Laterality: N/A;  . NM MYOVIEW LTD  08/31/2017   HIGH RISK: Combination Imaging & EKG:  with  in tmedium sized, moderate severity Reversible perfusion e apdefect in  the mid and apical anterior, apical lateral walls - FINDINGS ARE C/W ISCHEMIA in the LAD territory,  however given significant arrhythmias, severe ST depressions and TID =  MV CAD is highly likely (SDS =7).   . ORCHIECTOMY  1965   unilateral -- per pt ruptured  . TEE WITHOUT CARDIOVERSION N/A 09/06/2017   Procedure: TRANSESOPHAGEAL ECHOCARDIOGRAM (TEE);  INTRA-OP.  Surgeon: Rexene Alberts, MD;  Location: [facility] OR;  Service: Open Heart Surgery:;; Normal AoV, MV & TV.  Normal RV.  Marland Kitchen TRANSTHORACIC ECHOCARDIOGRAM  09/04/2017   (pre-CABG): EF 40-45%, diffuse HK.  Difficult to assess due to frequent PVCs. Severe LA dilation. Mildly increased PAP.  Marland Kitchen TRANSTHORACIC ECHOCARDIOGRAM  01/01/2018   Post CABG: EF moderately  reduced 40 and 45%.  Diffuse HK.  No identifiable R WMA.  GRII DD with severe LA dilation.  (High filling pressures).  Moderate aortic calcification-aortic sclerosis with no stenosis.  Mildly elevated PA pressures.  . TRANSTHORACIC ECHOCARDIOGRAM  11/17/2019   EF 50 to 55%.  No R WMA.  Mild G1 DD.  Moderate LA dilation.  Mild-moderate aortic valve sclerosis no stenosis.  Marland Kitchen UMBILICAL HERNIA REPAIR  2006  . VARICOSE VEIN SURGERY  2007   left endovascular laser ablation    Immunization History  Administered Date(s) Administered  . Tdap 07/16/2018  . Zoster Recombinat (Shingrix) 11/09/2018, 08/16/2019    MEDICATIONS/ALLERGIES   Current Meds  Medication Sig  . ascorbic acid (VITAMIN C) 500 MG tablet Take 1 tablet (500 mg total) by mouth daily.  Marland Kitchen aspirin EC 81 MG tablet Take 1 tablet (81 mg total) by mouth daily.  Marland Kitchen atorvastatin (LIPITOR) 80 MG tablet Take 1 tablet (80 mg total) by mouth daily at 6 PM.  . insulin glargine (LANTUS) 100 UNIT/ML injection Inject 15 Units into the skin daily.  Marland Kitchen JARDIANCE 25 MG TABS tablet Take 25 mg by mouth daily.  Marland Kitchen lisinopril (ZESTRIL) 10 MG tablet Take 10 mg by mouth daily.  . metFORMIN (GLUCOPHAGE) 500 MG tablet Take 500 mg by mouth 2 (two) times daily.  . metoprolol succinate (TOPROL-XL) 25 MG 24 hr tablet TAKE 1 TABLET BY MOUTH TWICE DAILY  . Multiple Vitamin (MULTIVITAMIN) tablet Take 1 tablet by mouth daily.  . TRULICITY 1.5 XH/3.7JI SOPN Inject 1.5 mg into the skin once a week. Take every Sunday    No Known Allergies  SOCIAL HISTORY/FAMILY HISTORY   Reviewed in Epic:  Pertinent findings:  Social History   Tobacco Use  . Smoking status: Never Smoker  . Smokeless tobacco: Never Used  Vaping Use  . Vaping Use: Never used  Substance Use Topics  . Alcohol use: No  . Drug use: No   Social History   Social History Narrative   married and lives locally in [place] with his family.     Taylor [name] has 2 adult children and several  grandchildren.     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     OBJCTIVE -PE, EKG, labs   Wt Readings from Last 3 Encounters:  03/18/20 214 lb 6.4 oz (97.3 kg)  03/08/20 211 lb 9.6 oz (96 kg)  01/07/20 202 lb 12.8 oz (92 kg)  01/14/2019-Taylor [name] weighed 219 pounds.  Physical Exam: BP 136/76 (BP Location: Left Arm, Patient Position: Sitting)   Pulse 77   Ht 5\' 11"  (1.803 m)   Wt 214 lb 6.4 oz (97.3 kg)   SpO2 96%   BMI 29.90 kg/m  Physical Exam Vitals reviewed.  Constitutional:      General: Taylor [name] is not in acute distress.    Appearance: Normal appearance. Taylor [name] is obese. Taylor [name] is not ill-appearing or toxic-appearing.     Comments: borderline  HENT:     Head: Normocephalic and atraumatic.  Neck:     Vascular: No carotid bruit, hepatojugular reflux or JVD.  Cardiovascular:     Rate and Rhythm: Normal rate and regular rhythm.     Pulses: Normal pulses.     Heart sounds: Normal heart sounds. No murmur heard. No friction rub. No gallop.   Pulmonary:     Effort: Pulmonary effort is normal. No respiratory distress.     Comments: Mildly decreased BS in R base - otw CTAB Chest:     Chest wall: No tenderness.  Abdominal:     General: Bowel sounds are normal. There is no distension.     Palpations: Abdomen is soft. There is no mass.  Musculoskeletal:        General: No swelling. Normal range of motion.     Cervical back: Normal range of motion and neck supple.  Skin:    General: Skin is warm and dry.     Coloration: Skin is not pale.  Neurological:     General: No focal deficit present.     Mental Status: Taylor [name] is alert and oriented to person, place, and time.     Cranial Nerves: No cranial nerve deficit.  Psychiatric:        Mood and Affect: Mood normal.        Behavior: Behavior normal.        Thought Content: Thought content normal.        Judgment: Judgment normal.     Adult ECG Report  n/a  Recent Labs:  reviewed  Lab Results   Component Value Date   CHOL 83 11/14/2019   HDL 22 (L) 11/14/2019   LDLCALC 51 11/14/2019   TRIG 51 11/14/2019   CHOLHDL 3.8 11/14/2019   Lab Results  Component Value Date   CREATININE 1.18 11/24/2019   BUN 40 (H) 11/24/2019   NA 137 11/24/2019   K 5.4 (H) 11/24/2019   CL 105 11/24/2019   CO2 24 11/24/2019   CBC Latest Ref Rng & Units 11/24/2019 11/23/2019 11/21/2019  WBC 4.0 - 10.5 K/uL 10.7(H) 13.6(H) 12.8(H)  Hemoglobin 13.0 - 17.0 g/dL 12.2(L) 12.9(L) 12.9(L)  Hematocrit 39.0 - 52.0 % 37.1(L) 38.7(L) 38.3(L)  Platelets 150 - 400 K/uL 364 408(H) 383   Lab Results  Component Value Date   HGBA1C 7.7 (H) 11/12/2019   No results found for: TSH  ==================================================  COVID-19 Education: The signs and symptoms of COVID-19 were discussed with the patient and how to seek care for testing (follow up with PCP or arrange E-visit).   The importance of social distancing and COVID-19 vaccination was discussed today. The patient is practicing social distancing & Masking.  I spent a total of 23 minutes with the patient spent in direct patient consultation.  Additional time spent with chart review  / charting (studies, outside notes, etc): 11 min Total Time: 41 min   Current medicines are reviewed at length with the patient today.  (+/- concerns) n/a  This visit occurred during the SARS-CoV-2 public health emergency.  Safety protocols were in place, including screening questions prior to the visit, additional usage of staff PPE, and extensive cleaning of exam room while observing appropriate contact time as indicated for disinfecting solutions.  Notice: This dictation was prepared with Dragon dictation along with smaller phrase technology. Any transcriptional errors that result from this process are unintentional and may not be corrected upon review.  Patient Instructions / Medication Changes & Studies & Tests Ordered   Patient Instructions   Medication Instructions:  No chnages  *If you need a refill on your cardiac medications before your next appointment, please call your pharmacy*   Lab Work:  Not needed   Testing/Procedures:  Not needed  Follow-Up: At [facility], you and your health needs are our priority.  As part of our continuing mission to provide you with exceptional heart care, we have created designated Provider Care Teams.  These Care Teams include your primary Cardiologist (physician) and Advanced Practice Providers (APPs -  Physician Assistants and Nurse Practitioners) who all work together to provide you with the care you need, when you need it.     Your next appointment:   5 to 6 month(s)  The format for your next appointment:   In Person  Provider:   [name], MD     Studies Ordered:   No orders of the defined types were placed in this encounter.    Taylor [name], M.D., M.S. Interventional Cardiologist   Pager # [phone] Phone # [phone] [address] [place], [place] [address]   Thank you for choosing Heartcare at [facility]!!

## 2020-03-27 ENCOUNTER — Encounter: Payer: Self-pay | Admitting: Cardiology

## 2020-03-27 NOTE — Assessment & Plan Note (Signed)
Compensating well. No CHF symptoms. Euvolemic on exam. No diuretic requirement while on Jardiance. Doing relatively well on combination of lisinopril and current dose of Toprol.

## 2020-03-27 NOTE — Assessment & Plan Note (Signed)
Difficult to tell whether this is PSVT or potentially some other arrhythmia. The fact that lasted short-lived and was very irregular, fast would suggest probable AVNRT.  Relatively well controlled on twice daily Toprol. I told that he can take an extra dose if he has further episodes. We discussed vagal maneuvers. If you were to have more frequent episodes, may consider further testing or treatment. Try to avoid antiarrhythmic. => Is known to EP. If he were to have more frequent episodes, will refer back.

## 2020-03-27 NOTE — Assessment & Plan Note (Signed)
Blood pressure borderline today. He has not yet taken his medications, they can explain part of it. He does have room to increase lisinopril if necessary along with current dose of Toprol.  I am reluctant to titrate Toprol any further because of potential fatigue.

## 2020-03-27 NOTE — Assessment & Plan Note (Addendum)
2 and half years out from his CABG. Based on the fact he was relatively asymptomatic given significant CAD, will plan ischemic evaluation in the fall 2023

## 2020-03-27 NOTE — Assessment & Plan Note (Signed)
Remains asymptomatic. On stable dose of beta-blocker-taking Toprol 25 mg twice daily.

## 2020-03-27 NOTE — Assessment & Plan Note (Signed)
LDL looks great on current dose of atorvastatin. He seems to be tolerating it fine without any major myalgias. Continue for now. If he were to start having symptoms, would probably switch to rosuvastatin.  None excellent cardiovascular regimen for diabetes with Metformin, sitagliptin (SGLT2 inhibitor) and dulaglutide (GLP-1 agonist). His hemoglobin was actually stable at 7.7 this in September 2021 (had been the same in November 2020) => may require more aggressive management per PCP.

## 2020-03-27 NOTE — Assessment & Plan Note (Signed)
He had remarkably minimal symptoms with significant CAD. Now status post CABG. Still not having any symptoms. He is recovering slowly but doing okay post Covid. Has reduced EF but no significant heart failure. No recurrent angina..  Plan:  Continue aspirin and high-dose statin  Continue 30 mg twice daily Toprol for now, but low threshold to increase dose based on recent rapid heart rate.  Continue current dose of lisinopril  On combination of Jardiance and Trulicity along with Metformin.

## 2020-03-31 DIAGNOSIS — E119 Type 2 diabetes mellitus without complications: Secondary | ICD-10-CM | POA: Diagnosis not present

## 2020-03-31 DIAGNOSIS — H2513 Age-related nuclear cataract, bilateral: Secondary | ICD-10-CM | POA: Diagnosis not present

## 2020-03-31 DIAGNOSIS — B0239 Other herpes zoster eye disease: Secondary | ICD-10-CM | POA: Diagnosis not present

## 2020-03-31 DIAGNOSIS — H5203 Hypermetropia, bilateral: Secondary | ICD-10-CM | POA: Diagnosis not present

## 2020-04-22 DIAGNOSIS — E1129 Type 2 diabetes mellitus with other diabetic kidney complication: Secondary | ICD-10-CM | POA: Diagnosis not present

## 2020-04-22 DIAGNOSIS — I130 Hypertensive heart and chronic kidney disease with heart failure and stage 1 through stage 4 chronic kidney disease, or unspecified chronic kidney disease: Secondary | ICD-10-CM | POA: Diagnosis not present

## 2020-04-22 DIAGNOSIS — I25810 Atherosclerosis of coronary artery bypass graft(s) without angina pectoris: Secondary | ICD-10-CM | POA: Diagnosis not present

## 2020-04-22 DIAGNOSIS — Z794 Long term (current) use of insulin: Secondary | ICD-10-CM | POA: Diagnosis not present

## 2020-04-22 DIAGNOSIS — N1832 Chronic kidney disease, stage 3b: Secondary | ICD-10-CM | POA: Diagnosis not present

## 2020-04-29 DIAGNOSIS — L578 Other skin changes due to chronic exposure to nonionizing radiation: Secondary | ICD-10-CM | POA: Diagnosis not present

## 2020-04-29 DIAGNOSIS — L570 Actinic keratosis: Secondary | ICD-10-CM | POA: Diagnosis not present

## 2020-06-29 DIAGNOSIS — E7800 Pure hypercholesterolemia, unspecified: Secondary | ICD-10-CM | POA: Diagnosis not present

## 2020-06-29 DIAGNOSIS — Z125 Encounter for screening for malignant neoplasm of prostate: Secondary | ICD-10-CM | POA: Diagnosis not present

## 2020-06-29 DIAGNOSIS — E1165 Type 2 diabetes mellitus with hyperglycemia: Secondary | ICD-10-CM | POA: Diagnosis not present

## 2020-07-01 DIAGNOSIS — R0981 Nasal congestion: Secondary | ICD-10-CM | POA: Diagnosis not present

## 2020-07-01 DIAGNOSIS — J069 Acute upper respiratory infection, unspecified: Secondary | ICD-10-CM | POA: Diagnosis not present

## 2020-07-01 DIAGNOSIS — Z1152 Encounter for screening for COVID-19: Secondary | ICD-10-CM | POA: Diagnosis not present

## 2020-07-01 DIAGNOSIS — L2082 Flexural eczema: Secondary | ICD-10-CM | POA: Diagnosis not present

## 2020-07-06 DIAGNOSIS — E1165 Type 2 diabetes mellitus with hyperglycemia: Secondary | ICD-10-CM | POA: Diagnosis not present

## 2020-07-06 DIAGNOSIS — I25810 Atherosclerosis of coronary artery bypass graft(s) without angina pectoris: Secondary | ICD-10-CM | POA: Diagnosis not present

## 2020-07-06 DIAGNOSIS — D692 Other nonthrombocytopenic purpura: Secondary | ICD-10-CM | POA: Diagnosis not present

## 2020-07-06 DIAGNOSIS — R82998 Other abnormal findings in urine: Secondary | ICD-10-CM | POA: Diagnosis not present

## 2020-07-06 DIAGNOSIS — Z0000 Encounter for general adult medical examination without abnormal findings: Secondary | ICD-10-CM | POA: Diagnosis not present

## 2020-07-06 DIAGNOSIS — E7800 Pure hypercholesterolemia, unspecified: Secondary | ICD-10-CM | POA: Diagnosis not present

## 2020-07-06 DIAGNOSIS — Z1389 Encounter for screening for other disorder: Secondary | ICD-10-CM | POA: Diagnosis not present

## 2020-07-06 DIAGNOSIS — N1832 Chronic kidney disease, stage 3b: Secondary | ICD-10-CM | POA: Diagnosis not present

## 2020-07-06 DIAGNOSIS — E1129 Type 2 diabetes mellitus with other diabetic kidney complication: Secondary | ICD-10-CM | POA: Diagnosis not present

## 2020-07-06 DIAGNOSIS — R809 Proteinuria, unspecified: Secondary | ICD-10-CM | POA: Diagnosis not present

## 2020-07-06 DIAGNOSIS — Z1331 Encounter for screening for depression: Secondary | ICD-10-CM | POA: Diagnosis not present

## 2020-07-06 DIAGNOSIS — E6609 Other obesity due to excess calories: Secondary | ICD-10-CM | POA: Diagnosis not present

## 2020-07-06 DIAGNOSIS — I130 Hypertensive heart and chronic kidney disease with heart failure and stage 1 through stage 4 chronic kidney disease, or unspecified chronic kidney disease: Secondary | ICD-10-CM | POA: Diagnosis not present

## 2020-08-17 DIAGNOSIS — Z794 Long term (current) use of insulin: Secondary | ICD-10-CM | POA: Diagnosis not present

## 2020-08-17 DIAGNOSIS — N1832 Chronic kidney disease, stage 3b: Secondary | ICD-10-CM | POA: Diagnosis not present

## 2020-08-17 DIAGNOSIS — I130 Hypertensive heart and chronic kidney disease with heart failure and stage 1 through stage 4 chronic kidney disease, or unspecified chronic kidney disease: Secondary | ICD-10-CM | POA: Diagnosis not present

## 2020-08-17 DIAGNOSIS — I25810 Atherosclerosis of coronary artery bypass graft(s) without angina pectoris: Secondary | ICD-10-CM | POA: Diagnosis not present

## 2020-08-17 DIAGNOSIS — E1129 Type 2 diabetes mellitus with other diabetic kidney complication: Secondary | ICD-10-CM | POA: Diagnosis not present

## 2020-08-28 ENCOUNTER — Other Ambulatory Visit: Payer: Self-pay | Admitting: Cardiology

## 2020-09-02 ENCOUNTER — Other Ambulatory Visit: Payer: Self-pay | Admitting: Cardiology

## 2020-11-10 DIAGNOSIS — L814 Other melanin hyperpigmentation: Secondary | ICD-10-CM | POA: Diagnosis not present

## 2020-11-10 DIAGNOSIS — D225 Melanocytic nevi of trunk: Secondary | ICD-10-CM | POA: Diagnosis not present

## 2020-11-10 DIAGNOSIS — L570 Actinic keratosis: Secondary | ICD-10-CM | POA: Diagnosis not present

## 2020-11-10 DIAGNOSIS — L821 Other seborrheic keratosis: Secondary | ICD-10-CM | POA: Diagnosis not present

## 2020-11-10 DIAGNOSIS — L819 Disorder of pigmentation, unspecified: Secondary | ICD-10-CM | POA: Diagnosis not present

## 2020-11-18 DIAGNOSIS — I130 Hypertensive heart and chronic kidney disease with heart failure and stage 1 through stage 4 chronic kidney disease, or unspecified chronic kidney disease: Secondary | ICD-10-CM | POA: Diagnosis not present

## 2020-11-18 DIAGNOSIS — I25810 Atherosclerosis of coronary artery bypass graft(s) without angina pectoris: Secondary | ICD-10-CM | POA: Diagnosis not present

## 2020-11-18 DIAGNOSIS — E1129 Type 2 diabetes mellitus with other diabetic kidney complication: Secondary | ICD-10-CM | POA: Diagnosis not present

## 2020-11-18 DIAGNOSIS — Z794 Long term (current) use of insulin: Secondary | ICD-10-CM | POA: Diagnosis not present

## 2020-11-18 DIAGNOSIS — Z23 Encounter for immunization: Secondary | ICD-10-CM | POA: Diagnosis not present

## 2020-11-18 DIAGNOSIS — N1832 Chronic kidney disease, stage 3b: Secondary | ICD-10-CM | POA: Diagnosis not present

## 2021-01-14 DIAGNOSIS — I130 Hypertensive heart and chronic kidney disease with heart failure and stage 1 through stage 4 chronic kidney disease, or unspecified chronic kidney disease: Secondary | ICD-10-CM | POA: Diagnosis not present

## 2021-01-14 DIAGNOSIS — E1129 Type 2 diabetes mellitus with other diabetic kidney complication: Secondary | ICD-10-CM | POA: Diagnosis not present

## 2021-01-14 DIAGNOSIS — E6609 Other obesity due to excess calories: Secondary | ICD-10-CM | POA: Diagnosis not present

## 2021-01-14 DIAGNOSIS — N1832 Chronic kidney disease, stage 3b: Secondary | ICD-10-CM | POA: Diagnosis not present

## 2021-01-14 DIAGNOSIS — Z9981 Dependence on supplemental oxygen: Secondary | ICD-10-CM | POA: Diagnosis not present

## 2021-01-14 DIAGNOSIS — I255 Ischemic cardiomyopathy: Secondary | ICD-10-CM | POA: Diagnosis not present

## 2021-01-14 DIAGNOSIS — I8390 Asymptomatic varicose veins of unspecified lower extremity: Secondary | ICD-10-CM | POA: Diagnosis not present

## 2021-01-14 DIAGNOSIS — E1165 Type 2 diabetes mellitus with hyperglycemia: Secondary | ICD-10-CM | POA: Diagnosis not present

## 2021-01-14 DIAGNOSIS — D692 Other nonthrombocytopenic purpura: Secondary | ICD-10-CM | POA: Diagnosis not present

## 2021-01-14 DIAGNOSIS — E7800 Pure hypercholesterolemia, unspecified: Secondary | ICD-10-CM | POA: Diagnosis not present

## 2021-01-14 DIAGNOSIS — I25810 Atherosclerosis of coronary artery bypass graft(s) without angina pectoris: Secondary | ICD-10-CM | POA: Diagnosis not present

## 2021-01-18 DIAGNOSIS — Z20822 Contact with and (suspected) exposure to covid-19: Secondary | ICD-10-CM | POA: Diagnosis not present

## 2021-01-18 DIAGNOSIS — E119 Type 2 diabetes mellitus without complications: Secondary | ICD-10-CM | POA: Diagnosis not present

## 2021-01-18 DIAGNOSIS — I10 Essential (primary) hypertension: Secondary | ICD-10-CM | POA: Diagnosis not present

## 2021-01-18 DIAGNOSIS — J069 Acute upper respiratory infection, unspecified: Secondary | ICD-10-CM | POA: Diagnosis not present

## 2021-03-08 DIAGNOSIS — I25810 Atherosclerosis of coronary artery bypass graft(s) without angina pectoris: Secondary | ICD-10-CM | POA: Diagnosis not present

## 2021-03-08 DIAGNOSIS — Z794 Long term (current) use of insulin: Secondary | ICD-10-CM | POA: Diagnosis not present

## 2021-03-08 DIAGNOSIS — E1129 Type 2 diabetes mellitus with other diabetic kidney complication: Secondary | ICD-10-CM | POA: Diagnosis not present

## 2021-03-08 DIAGNOSIS — N1832 Chronic kidney disease, stage 3b: Secondary | ICD-10-CM | POA: Diagnosis not present

## 2021-03-08 DIAGNOSIS — I130 Hypertensive heart and chronic kidney disease with heart failure and stage 1 through stage 4 chronic kidney disease, or unspecified chronic kidney disease: Secondary | ICD-10-CM | POA: Diagnosis not present

## 2021-03-29 DIAGNOSIS — R0981 Nasal congestion: Secondary | ICD-10-CM | POA: Diagnosis not present

## 2021-03-29 DIAGNOSIS — U071 COVID-19: Secondary | ICD-10-CM | POA: Diagnosis not present

## 2021-03-29 DIAGNOSIS — J029 Acute pharyngitis, unspecified: Secondary | ICD-10-CM | POA: Diagnosis not present

## 2021-03-29 DIAGNOSIS — R110 Nausea: Secondary | ICD-10-CM | POA: Diagnosis not present

## 2021-03-29 DIAGNOSIS — Z1152 Encounter for screening for COVID-19: Secondary | ICD-10-CM | POA: Diagnosis not present

## 2021-03-29 DIAGNOSIS — R519 Headache, unspecified: Secondary | ICD-10-CM | POA: Diagnosis not present

## 2021-03-29 DIAGNOSIS — E1129 Type 2 diabetes mellitus with other diabetic kidney complication: Secondary | ICD-10-CM | POA: Diagnosis not present

## 2021-05-17 DIAGNOSIS — I130 Hypertensive heart and chronic kidney disease with heart failure and stage 1 through stage 4 chronic kidney disease, or unspecified chronic kidney disease: Secondary | ICD-10-CM | POA: Diagnosis not present

## 2021-05-17 DIAGNOSIS — Z794 Long term (current) use of insulin: Secondary | ICD-10-CM | POA: Diagnosis not present

## 2021-05-17 DIAGNOSIS — E1129 Type 2 diabetes mellitus with other diabetic kidney complication: Secondary | ICD-10-CM | POA: Diagnosis not present

## 2021-05-17 DIAGNOSIS — N1832 Chronic kidney disease, stage 3b: Secondary | ICD-10-CM | POA: Diagnosis not present

## 2021-05-17 DIAGNOSIS — I25810 Atherosclerosis of coronary artery bypass graft(s) without angina pectoris: Secondary | ICD-10-CM | POA: Diagnosis not present

## 2021-05-30 ENCOUNTER — Other Ambulatory Visit: Payer: Self-pay | Admitting: Cardiology

## 2021-07-15 DIAGNOSIS — E7800 Pure hypercholesterolemia, unspecified: Secondary | ICD-10-CM | POA: Diagnosis not present

## 2021-07-15 DIAGNOSIS — E1129 Type 2 diabetes mellitus with other diabetic kidney complication: Secondary | ICD-10-CM | POA: Diagnosis not present

## 2021-07-15 DIAGNOSIS — Z125 Encounter for screening for malignant neoplasm of prostate: Secondary | ICD-10-CM | POA: Diagnosis not present

## 2021-07-15 DIAGNOSIS — Z0000 Encounter for general adult medical examination without abnormal findings: Secondary | ICD-10-CM | POA: Diagnosis not present

## 2021-07-15 DIAGNOSIS — R7989 Other specified abnormal findings of blood chemistry: Secondary | ICD-10-CM | POA: Diagnosis not present

## 2021-07-22 DIAGNOSIS — R82998 Other abnormal findings in urine: Secondary | ICD-10-CM | POA: Diagnosis not present

## 2021-07-22 DIAGNOSIS — I25810 Atherosclerosis of coronary artery bypass graft(s) without angina pectoris: Secondary | ICD-10-CM | POA: Diagnosis not present

## 2021-07-22 DIAGNOSIS — Z1339 Encounter for screening examination for other mental health and behavioral disorders: Secondary | ICD-10-CM | POA: Diagnosis not present

## 2021-07-22 DIAGNOSIS — I255 Ischemic cardiomyopathy: Secondary | ICD-10-CM | POA: Diagnosis not present

## 2021-07-22 DIAGNOSIS — Z1331 Encounter for screening for depression: Secondary | ICD-10-CM | POA: Diagnosis not present

## 2021-07-22 DIAGNOSIS — E663 Overweight: Secondary | ICD-10-CM | POA: Diagnosis not present

## 2021-07-22 DIAGNOSIS — N1832 Chronic kidney disease, stage 3b: Secondary | ICD-10-CM | POA: Diagnosis not present

## 2021-07-22 DIAGNOSIS — E1129 Type 2 diabetes mellitus with other diabetic kidney complication: Secondary | ICD-10-CM | POA: Diagnosis not present

## 2021-07-22 DIAGNOSIS — I8390 Asymptomatic varicose veins of unspecified lower extremity: Secondary | ICD-10-CM | POA: Diagnosis not present

## 2021-07-22 DIAGNOSIS — Z0000 Encounter for general adult medical examination without abnormal findings: Secondary | ICD-10-CM | POA: Diagnosis not present

## 2021-07-22 DIAGNOSIS — Z794 Long term (current) use of insulin: Secondary | ICD-10-CM | POA: Diagnosis not present

## 2021-07-22 DIAGNOSIS — E1165 Type 2 diabetes mellitus with hyperglycemia: Secondary | ICD-10-CM | POA: Diagnosis not present

## 2021-07-22 DIAGNOSIS — E7800 Pure hypercholesterolemia, unspecified: Secondary | ICD-10-CM | POA: Diagnosis not present

## 2021-07-22 DIAGNOSIS — D692 Other nonthrombocytopenic purpura: Secondary | ICD-10-CM | POA: Diagnosis not present

## 2021-07-22 DIAGNOSIS — I130 Hypertensive heart and chronic kidney disease with heart failure and stage 1 through stage 4 chronic kidney disease, or unspecified chronic kidney disease: Secondary | ICD-10-CM | POA: Diagnosis not present

## 2021-08-05 ENCOUNTER — Other Ambulatory Visit: Payer: Self-pay | Admitting: Cardiology

## 2021-08-25 DIAGNOSIS — H5203 Hypermetropia, bilateral: Secondary | ICD-10-CM | POA: Diagnosis not present

## 2021-08-25 DIAGNOSIS — H524 Presbyopia: Secondary | ICD-10-CM | POA: Diagnosis not present

## 2021-08-25 DIAGNOSIS — H2513 Age-related nuclear cataract, bilateral: Secondary | ICD-10-CM | POA: Diagnosis not present

## 2021-08-25 DIAGNOSIS — E119 Type 2 diabetes mellitus without complications: Secondary | ICD-10-CM | POA: Diagnosis not present

## 2021-08-26 ENCOUNTER — Other Ambulatory Visit: Payer: Self-pay | Admitting: Cardiology

## 2021-08-31 DIAGNOSIS — D225 Melanocytic nevi of trunk: Secondary | ICD-10-CM | POA: Diagnosis not present

## 2021-08-31 DIAGNOSIS — L578 Other skin changes due to chronic exposure to nonionizing radiation: Secondary | ICD-10-CM | POA: Diagnosis not present

## 2021-08-31 DIAGNOSIS — L570 Actinic keratosis: Secondary | ICD-10-CM | POA: Diagnosis not present

## 2021-08-31 DIAGNOSIS — L2089 Other atopic dermatitis: Secondary | ICD-10-CM | POA: Diagnosis not present

## 2021-08-31 DIAGNOSIS — L821 Other seborrheic keratosis: Secondary | ICD-10-CM | POA: Diagnosis not present

## 2021-08-31 DIAGNOSIS — L814 Other melanin hyperpigmentation: Secondary | ICD-10-CM | POA: Diagnosis not present

## 2021-09-02 ENCOUNTER — Other Ambulatory Visit: Payer: Self-pay | Admitting: Cardiology

## 2021-10-26 DIAGNOSIS — N1832 Chronic kidney disease, stage 3b: Secondary | ICD-10-CM | POA: Diagnosis not present

## 2021-10-26 DIAGNOSIS — Z794 Long term (current) use of insulin: Secondary | ICD-10-CM | POA: Diagnosis not present

## 2021-10-26 DIAGNOSIS — I25810 Atherosclerosis of coronary artery bypass graft(s) without angina pectoris: Secondary | ICD-10-CM | POA: Diagnosis not present

## 2021-10-26 DIAGNOSIS — E1129 Type 2 diabetes mellitus with other diabetic kidney complication: Secondary | ICD-10-CM | POA: Diagnosis not present

## 2021-10-26 DIAGNOSIS — I130 Hypertensive heart and chronic kidney disease with heart failure and stage 1 through stage 4 chronic kidney disease, or unspecified chronic kidney disease: Secondary | ICD-10-CM | POA: Diagnosis not present

## 2021-11-07 DIAGNOSIS — L578 Other skin changes due to chronic exposure to nonionizing radiation: Secondary | ICD-10-CM | POA: Diagnosis not present

## 2021-11-07 DIAGNOSIS — L570 Actinic keratosis: Secondary | ICD-10-CM | POA: Diagnosis not present

## 2021-11-08 DIAGNOSIS — E1165 Type 2 diabetes mellitus with hyperglycemia: Secondary | ICD-10-CM | POA: Diagnosis not present

## 2021-11-18 ENCOUNTER — Other Ambulatory Visit: Payer: Self-pay | Admitting: Cardiology

## 2021-11-26 ENCOUNTER — Other Ambulatory Visit: Payer: Self-pay | Admitting: Cardiology

## 2021-12-08 DIAGNOSIS — E1165 Type 2 diabetes mellitus with hyperglycemia: Secondary | ICD-10-CM | POA: Diagnosis not present

## 2021-12-23 ENCOUNTER — Ambulatory Visit: Payer: PPO | Attending: Cardiology | Admitting: Cardiology

## 2021-12-23 ENCOUNTER — Encounter: Payer: Self-pay | Admitting: Cardiology

## 2021-12-23 VITALS — BP 142/78 | HR 57 | Ht 71.0 in | Wt 214.6 lb

## 2021-12-23 DIAGNOSIS — I10 Essential (primary) hypertension: Secondary | ICD-10-CM | POA: Diagnosis not present

## 2021-12-23 DIAGNOSIS — E785 Hyperlipidemia, unspecified: Secondary | ICD-10-CM

## 2021-12-23 DIAGNOSIS — I493 Ventricular premature depolarization: Secondary | ICD-10-CM | POA: Diagnosis not present

## 2021-12-23 DIAGNOSIS — I471 Supraventricular tachycardia, unspecified: Secondary | ICD-10-CM

## 2021-12-23 DIAGNOSIS — I2510 Atherosclerotic heart disease of native coronary artery without angina pectoris: Secondary | ICD-10-CM

## 2021-12-23 DIAGNOSIS — I255 Ischemic cardiomyopathy: Secondary | ICD-10-CM | POA: Diagnosis not present

## 2021-12-23 DIAGNOSIS — E1169 Type 2 diabetes mellitus with other specified complication: Secondary | ICD-10-CM | POA: Diagnosis not present

## 2021-12-23 DIAGNOSIS — Z951 Presence of aortocoronary bypass graft: Secondary | ICD-10-CM | POA: Diagnosis not present

## 2021-12-23 MED ORDER — METOPROLOL SUCCINATE ER 25 MG PO TB24: 25.0000 mg | ORAL_TABLET | Freq: Two times a day (BID) | ORAL | 3 refills | Status: DC

## 2021-12-23 MED ORDER — ATORVASTATIN CALCIUM 80 MG PO TABS: 80.0000 mg | ORAL_TABLET | Freq: Every day | ORAL | 3 refills | Status: DC

## 2021-12-23 NOTE — Patient Instructions (Addendum)
Medication Instructions:  No changes Medication refilled Atorvastatin , Metoprolol   *If you need a refill on your cardiac medications before your next appointment, please call your pharmacy*   Lab Work:  Not needed   Testing/Procedures:  Not needed  Follow-Up: At [facility], you and your health needs are our priority.  As part of our continuing mission to provide you with exceptional heart care, we have created designated Provider Care Teams.  These Care Teams include your primary Cardiologist (physician) and Advanced Practice Providers (APPs -  Physician Assistants and Nurse Practitioners) who all work together to provide you with the care you need, when you need it.     Your next appointment:   12 month(s)  The format for your next appointment:   In Person  Provider:   [name], MD

## 2021-12-23 NOTE — Progress Notes (Addendum)
Primary Care Provider: Haywood Pao, MD [place] Cardiologist: [name], MD Electrophysiologist: Will Meredith Leeds, MD  Clinic Note: No chief complaint on file.   ===================================  ASSESSMENT/PLAN   Problem List Items Addressed This Visit       Cardiology Problems   Ischemic cardiomyopathy - pre-CABG ECHO: 40-45% (Chronic)    Doing well.  Decompensating without any issues.  Not requiring diuretic other than diuretic effect Jardiance (at diabetes dose).  On stable dose of Toprol and lisinopril.      Relevant Medications   atorvastatin (LIPITOR) 80 MG tablet   metoprolol succinate (TOPROL-XL) 25 MG 24 hr tablet   Frequent unifocal PVCs (Chronic)    Remains on 45 mg twice daily Toprol appears stable.  Does not really notice unless he has short bursts or runs      Relevant Medications   atorvastatin (LIPITOR) 80 MG tablet   metoprolol succinate (TOPROL-XL) 25 MG 24 hr tablet   Other Relevant Orders   EKG 12-Lead (Completed)   Coronary artery disease involving native coronary artery of native heart without angina pectoris - Primary (Chronic)    Is doing remarkably well now-[place] post-COVID.  And to look back to normal.  No active anginal symptoms.  Plan ->  Continue current meds as prescribed: 81 mg aspirin-okay to hold for procedures or surgeries. Toprol 25 mg twice daily (and occasionally additional dose to treat tachycardia) Lisinopril 10 mg daily. Atorvastatin 80 mg daily. Jardiance 25 mg daily along with metformin 500 mg twice daily. No longer on Trulicity, it is now on Lantus.      Relevant Medications   atorvastatin (LIPITOR) 80 MG tablet   metoprolol succinate (TOPROL-XL) 25 MG 24 hr tablet   Other Relevant Orders   EKG 12-Lead (Completed)   PSVT (paroxysmal supraventricular tachycardia) (Chronic)    On beta-blocker.  No recurrent symptoms at last block.  Discussed vagal relievers.  Also discussed either earlier  dose of Toprol or additional dose of for breakthrough spells      Relevant Medications   atorvastatin (LIPITOR) 80 MG tablet   metoprolol succinate (TOPROL-XL) 25 MG 24 hr tablet   Other Relevant Orders   EKG 12-Lead (Completed)   Hyperlipidemia associated with type 2 diabetes mellitus ([place]) (Chronic)    His LDL was 70 last visit.  Stable.  1 to make sure to stay in the same direction.  Because would need to be more aggressive if not can continue on current meds.  not having any myalgias despite 80 mg atorvastatin      Relevant Medications   atorvastatin (LIPITOR) 80 MG tablet   metoprolol succinate (TOPROL-XL) 25 MG 24 hr tablet   Essential hypertension (Chronic)    This morning's blood pressure 120/76.  Advised to keep track of his blood pressures and if he does start having pressures that are more like they are here or at home, then we need to probably titrate up his lisinopril dose.  Otherwise with his pressure remained stable at home, will adjust further.  Continue current meds. Lisinopril 10 mg daily and Toprol (metoprolol succinate 25 mg twice daily (he is on Toprol TID and metoprolol tartrate to ensure full coverage with his history of PSVT and short burst of NSVT previously documented..      Relevant Medications   atorvastatin (LIPITOR) 80 MG tablet   metoprolol succinate (TOPROL-XL) 25 MG 24 hr tablet     Other   S/P CABG x 4 (Chronic)  We talked about the evaluation this fall traumatic with him doing so well I think we will hold off for another year. Plan will be mildly for stress PET after next annual follow-up      ===================================  HPI:    Taylor [name] is a 71 y.o. male with a PMH below who presents today for 28-monthfollow-up.  June 2019:Jeran was initially seen for ventricular bigeminy/bradycardia and a strong family history of CAD.   --> He noted some significant exertional dyspnea and chest tightness. ->  High Risk Myoview with  anterior ischemia--Cath w/ MV CAD--> CABGx4 -  LIMA to LAD, SVG to OM2, Sequential SVG to PDA and RPL2 (July 19).  -> Postop monitor showed PSVT (initially considered to be A. 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  Just having a low slow progression from COVID.  Does not back to baseline.  No more chest pain or pressure.  Oxygen levels getting better.  He lost about 30 lb during his bout & had regained ~20lb. he had 1 episode of tachycardia but walks with rate went up to 167.  No chest pain or dizziness.  Felt funny.  Did not last very long he relates he has not yet taken his beta-blocker, so he took the dose a little early and the episode broke and lasts maybe 10 minutes.  Noted his blood pressure at home usually ranges 120s to 130/70 on the high end.  Was concerned about his pressures being elevated in clinic.  Recent Hospitalizations: None  Reviewed  CV studies:    The following studies were reviewed today: (if available, images/films reviewed: From Epic Chart or Care Everywhere) None:  Interval History:   HLINDA BIEHNreturns here today saying that he is finally over COVID symptoms.  Energy level getting better.  Unfortunately during all this process, he was fired from his long-term job at [employer] to go back to work as early as he wanted.  He actually went back in for a day and after being there for the better part of the day, his manage notified him that because he was no longer on the payroll.  After trying to fight this little while, he decided that it was not worth it.  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 Keeps him busy -- he seems happy - one of his friend's son. Also doing odd jobs for friends.   He is still pretty active  with his job but he would like to hold off more because he can work on his own timeline, and will often do jobs for friends even though his bosses family while still on the clock which is of that he is not able to do before.  He is able mobility much more active and feels much more helpful.  He really feels well with no major issues.  No further tachycardia spells no chest pain or pressure.  No CHF symptoms no syncope or near syncope.  Still has a mild hacking cough from the [place] rare.  CV Review of Symptoms (Summary): Cardiovascular ROS: no chest pain or dyspnea on exertion negative for - edema, irregular heartbeat, orthopnea, palpitations, paroxysmal nocturnal dyspnea, rapid heart rate, shortness of breath, or  syncope/near syncope; TIA/amaurosis fugax; claudication  After 9 months of having his mother-in-law living with them, his wife finally decided that they needed to put her in a facility.  Ever since that happened, has been much less stress in the house.  REVIEWED OF SYSTEMS   Review of Systems  Constitutional:  Negative for malaise/fatigue and weight loss.  HENT:  Negative for congestion.   Respiratory:  Positive for cough (Occasional). Negative for shortness of breath.   Cardiovascular:  Negative for claudication and leg swelling.  Gastrointestinal:  Negative for blood in stool and melena.  Genitourinary:  Positive for frequency. Negative for dysuria.  Musculoskeletal:  Negative for back pain, falls, joint pain and myalgias.   I have reviewed and (if needed) personally updated the patient's problem list, medications, allergies, past medical and surgical history, social and family history.   PAST MEDICAL HISTORY   Past Medical History:  Diagnosis Date   Bigeminal rhythm 08/01/2017   per EKG 07-24-2017 in epic (per confirmation new since last ecg in 2007)  in care everywhere in epic ecg dated 2012 shows sinus brady rate 53 no pvc's --per pt asymptomatic and     Bradycardia    Coronary artery disease involving native coronary artery 08/2017   Abnormal Myoview 08/2017 (Abnormal EKG - Severe ST depressions, PVCs & nsVT). TID noted.  Moderate LAD territory Ischemic Perfusion defect --> Cath with extensive RCA (RPDA & RPAV), Cx & LAD disease --> CABG x 4   Family history of premature coronary artery disease    Father - MI 83. Paternal uncle x 2 -1 with MI & other with CABG in 97s   History of contusion    ED visit 08-04-2013 , 9 days past injury ,  dx concussion w/ LOC, per CT inferior frontal lobes contusion hemarrhage-- residual headache (08-01-2017  per pt residual resolved)   History of kidney stones    Hypercholesterolemia    Hypertension    Nephrolithiasis    bilateral nonobstructive per CT 07-24-2017   S/P CABG x 4 09/06/2017   LIMA to LAD, SVG to OM2, Sequential SVG to PDA and RPL2, EVH via right thigh and leg   Type 2 diabetes mellitus ([place])    followed by pcp   Ureteral calculi    left   Wears glasses    Wears hearing aid in both ears     PAST SURGICAL HISTORY   Past Surgical History:  Procedure Laterality Date   CORONARY ARTERY BYPASS GRAFT N/A 09/06/2017   Procedure: CORONARY ARTERY BYPASS GRAFTING (CABG) x 4, using left internal mammary artery and right leg greater saphenous vein harvested endoscopically;  Surgeon: Rexene Alberts, MD;  Location: [facility];  Service: Open Heart Surgery;; LIMA-LAD, SVG-OM2, SeqSVG-RPDA-RPL2   CYSTOSCOPY WITH RETROGRADE PYELOGRAM, URETEROSCOPY AND STENT PLACEMENT Left 08/02/2018   Procedure: CYSTOSCOPY WITH RETROGRADE PYELOGRAM, URETEROSCOPY AND STENT PLACEMENT;  Surgeon: Cleon Gustin, MD;  Location: [facility];  Service: Urology;  Laterality: Left;   CYSTOSCOPY/RETROGRADE/URETEROSCOPY Left 08/06/2017   Procedure: CYSTOSCOPY/RETROGRADE/URETEROSCOPY laser and stone basket extrection;  Surgeon: Cleon Gustin, MD;  Location: [facility];  Service: Urology;  Laterality: Left;   HOLMIUM  LASER APPLICATION Left 3/50/0938   Procedure: HOLMIUM LASER APPLICATION;  Surgeon: Cleon Gustin, MD;  Location: [facility];  Service: Urology;  Laterality: Left;   LAPAROSCOPIC APPENDECTOMY  11-18-2005    dr wyatt  [facility]   LEFT HEART CATH AND CORONARY ANGIOGRAPHY N/A 09/03/2017   Procedure: LEFT HEART  CATH AND CORONARY ANGIOGRAPHY;  Surgeon: Leonie Man, MD;  Location: [facility];  Service: Cardiovascular;; mRCA 70%, dRCA 99%-95% into RPAV & 90% ostRPDA 95%. RPL1 70%, distal RVAM (tandem PDA) - 85%. Ost-prox LAD 40% -pLAD 60&90% @ D1 ->p-mLAD 95% @ D2 with 70% D2. pCx 80%, p-mCx 90%-> mCx 85%.  EF 45-50%, global HK. -->> referred for CABG   NASAL ENDOSCOPY WITH EPISTAXIS CONTROL N/A 11/29/2017   Procedure: NASAL ENDOSCOPY WITH EPISTAXIS CONTROL;  Surgeon: Leta Baptist, MD;  Location: [facility];  Service: ENT;  Laterality: N/A;   NM MYOVIEW LTD  08/31/2017   HIGH RISK: Combination Imaging & EKG:  with  in tmedium sized, moderate severity Reversible perfusion e apdefect in  the mid and apical anterior, apical lateral walls - FINDINGS ARE C/W ISCHEMIA in the LAD territory,  however given significant arrhythmias, severe ST depressions and TID =  MV CAD is highly likely (SDS =7).    ORCHIECTOMY  1965   unilateral -- per pt ruptured   TEE WITHOUT CARDIOVERSION N/A 09/06/2017   Procedure: TRANSESOPHAGEAL ECHOCARDIOGRAM (TEE);  INTRA-OP.  Surgeon: Rexene Alberts, MD;  Location: [facility];  Service: Open Heart Surgery:;; Normal AoV, MV & TV.  Normal RV.   TRANSTHORACIC ECHOCARDIOGRAM  09/04/2017   (pre-CABG): EF 40-45%, diffuse HK.  Difficult to assess due to frequent PVCs. Severe LA dilation. Mildly increased PAP.   TRANSTHORACIC ECHOCARDIOGRAM  01/01/2018   Post CABG: EF moderately reduced 40 and 45%.  Diffuse HK.  No identifiable R WMA.  GRII DD with severe LA dilation.  (High filling pressures).  Moderate aortic calcification-aortic sclerosis with no stenosis.  Mildly elevated PA  pressures.   TRANSTHORACIC ECHOCARDIOGRAM  11/17/2019   EF 50 to 55%.  No R WMA.  Mild G1 DD.  Moderate LA dilation.  Mild-moderate aortic valve sclerosis no stenosis.   UMBILICAL HERNIA REPAIR  2006   VARICOSE VEIN SURGERY  2007   left endovascular laser ablation    Immunization History  Administered Date(s) Administered   Tdap 07/16/2018   Zoster Recombinat (Shingrix) 11/09/2018, 08/16/2019    MEDICATIONS/ALLERGIES   Current Meds  Medication Sig   aspirin EC 81 MG tablet Take 1 tablet (81 mg total) by mouth daily.   atorvastatin (LIPITOR) 80 MG tablet TAKE 1 TABLET (80 MG TOTAL) BY MOUTH DAILY. PLEASE KEEP OFFICE VISIT WITH DR. Melisse Caetano   insulin glargine (LANTUS) 100 UNIT/ML injection Inject 15 Units into the skin daily.   JARDIANCE 25 MG TABS tablet Take 25 mg by mouth daily.   lisinopril (ZESTRIL) 10 MG tablet Take 10 mg by mouth daily.   metFORMIN (GLUCOPHAGE) 500 MG tablet Take 500 mg by mouth 2 (two) times daily.   metoprolol succinate (TOPROL-XL) 25 MG 24 hr tablet Take 1 tablet (25 mg total) by mouth 2 (two) times daily. KEEP UPCOMING APPOINTMENT FOR FUTURE REFILLS.   Multiple Vitamin (MULTIVITAMIN) tablet Take 1 tablet by mouth daily.    No Known Allergies  SOCIAL HISTORY/FAMILY HISTORY   Reviewed in Epic:  Pertinent findings:  Social History   Tobacco Use   Smoking status: Never   Smokeless tobacco: Never  Vaping Use   Vaping Use: Never used  Substance Use Topics   Alcohol use: No   Drug use: No   Social History   Social History Narrative   married and lives locally in [place] with his family.     He has 2 adult children and several  grandchildren.     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     OBJCTIVE -PE, EKG, labs   Wt Readings from Last 3 Encounters:  12/23/21 214 lb 9.6 oz (97.3 kg)  03/18/20 214 lb 6.4 oz (97.3 kg)  03/08/20 211 lb 9.6 oz (96 kg)    Physical Exam: BP (!) 142/78    Pulse (!) 57   Ht '5\' 11"'$  (1.803 m)   Wt 214 lb 9.6 oz (97.3 kg)   SpO2 96%   BMI 29.93 kg/m  Physical Exam Vitals reviewed.  Constitutional:      General: He is not in acute distress.    Appearance: Normal appearance. He is not toxic-appearing.     Comments: Well-nourished, well-groomed.  Borderline obese.  Healthy-appearing.  HENT:     Head: Normocephalic and atraumatic.  Neck:     Vascular: No carotid bruit or JVD.  Cardiovascular:     Rate and Rhythm: Normal rate and regular rhythm. Occasional Extrasystoles are present.    Chest Wall: PMI is not displaced.     Pulses: Intact distal pulses.     Heart sounds: S1 normal and S2 normal. Heart sounds are distant. No murmur heard.    No friction rub. No gallop.  Pulmonary:     Effort: Pulmonary effort is normal. No respiratory distress.     Breath sounds: Normal breath sounds. No wheezing, rhonchi or rales.  Chest:     Chest wall: No tenderness.  Musculoskeletal:        General: No swelling. Normal range of motion.     Cervical back: Normal range of motion and neck supple.  Skin:    General: Skin is warm and dry.  Neurological:     General: No focal deficit present.     Mental Status: He is alert and oriented to person, place, and time. Mental status is at baseline.     Motor: No weakness.     Gait: Gait normal.  Psychiatric:        Mood and Affect: Mood normal.        Behavior: Behavior normal.        Thought Content: Thought content normal.        Judgment: Judgment normal.     Adult ECG Report  Rate: 57;  Rhythm: sinus bradycardia; nonspecific ST-T wave changes.  Cannot exclude inferior MI, age-indeterminate.  Otherwise normal axis}.  Narrative Interpretation: Stable  Recent Labs:   07/15/2021: TC 170, TG 79, HDL 31, LDL 70.   10/26/2021 A1c 6.7 (PCP recently increased his glargine dose to 20 units. Lab Results  Component Value Date   CHOL 83 11/14/2019   HDL 22 (L) 11/14/2019   LDLCALC 51 11/14/2019   TRIG 51  11/14/2019   CHOLHDL 3.8 11/14/2019   Lab Results  Component Value Date   CREATININE 1.18 11/24/2019   BUN 40 (H) 11/24/2019   NA 137 11/24/2019   K 5.4 (H) 11/24/2019   CL 105 11/24/2019   CO2 24 11/24/2019      Latest Ref Rng & Units 11/24/2019    4:47 AM 11/23/2019    4:37 AM 11/21/2019    4:27 AM  CBC  WBC 4.0 - 10.5 K/uL 10.7  13.6  12.8   Hemoglobin 13.0 - 17.0 g/dL 12.2  12.9  12.9   Hematocrit 39.0 - 52.0 % 37.1  38.7  38.3   Platelets 150 - 400 K/uL 364  408  383     Lab Results  Component Value Date   HGBA1C 7.7 (H) 11/12/2019   No results found for: "TSH"  ================================================== I spent a total of 18 minutes with the patient spent in direct patient consultation.  Additional time spent with chart review  / charting (studies, outside notes, etc): 14 min Total Time: 32 min  Current medicines are reviewed at length with the patient today.  (+/- concerns) none  Notice: This dictation was prepared with Dragon dictation along with smart phrase technology. Any transcriptional errors that result from this process are unintentional and may not be corrected upon review.  Studies Ordered:   Orders Placed This Encounter  Procedures   EKG 12-Lead   Meds ordered this encounter  Medications   atorvastatin (LIPITOR) 80 MG tablet    Sig: Take 1 tablet (80 mg total) by mouth daily.    Dispense:  90 tablet    Refill:  3   metoprolol succinate (TOPROL-XL) 25 MG 24 hr tablet    Sig: Take 1 tablet (25 mg total) by mouth 2 (two) times daily.    Dispense:  180 tablet    Refill:  3    Patient Instructions / Medication Changes & Studies & Tests Ordered   Patient Instructions  Medication Instructions:  No changes Medication refilled Atorvastatin , Metoprolol   *If you need a refill on your cardiac medications before your next appointment, please call your pharmacy*   Lab Work:  Not needed   Testing/Procedures:  Not  needed  Follow-Up: At [facility], you and your health needs are our priority.  As part of our continuing mission to provide you with exceptional heart care, we have created designated Provider Care Teams.  These Care Teams include your primary Cardiologist (physician) and Advanced Practice Providers (APPs -  Physician Assistants and Nurse Practitioners) who all work together to provide you with the care you need, when you need it.     Your next appointment:   12 month(s)  The format for your next appointment:   In Person  Provider:   [name], MD       Leonie Man, MD, MS [name], M.D., M.S. Interventional Cardiologist  [place]  Pager # [phone] Phone # [phone] [address] [place],  [address]   Thank you for choosing [facility]!!

## 2022-01-08 DIAGNOSIS — E1165 Type 2 diabetes mellitus with hyperglycemia: Secondary | ICD-10-CM | POA: Diagnosis not present

## 2022-01-20 NOTE — Assessment & Plan Note (Signed)
We talked about the evaluation this fall traumatic with him doing so well I think we will hold off for another year. Plan will be mildly for stress PET after next annual follow-up

## 2022-01-20 NOTE — Assessment & Plan Note (Signed)
Is doing remarkably well now-[place] post-COVID.  And to look back to normal.  No active anginal symptoms.  Plan ->  Continue current meds as prescribed: 81 mg aspirin-okay to hold for procedures or surgeries. Toprol 25 mg twice daily (and occasionally additional dose to treat tachycardia) Lisinopril 10 mg daily. Atorvastatin 80 mg daily. Jardiance 25 mg daily along with metformin 500 mg twice daily. No longer on Trulicity, it is now on Lantus.

## 2022-01-20 NOTE — Assessment & Plan Note (Signed)
Remains on 45 mg twice daily Toprol appears stable.  Does not really notice unless he has short bursts or runs

## 2022-01-20 NOTE — Assessment & Plan Note (Signed)
This morning's blood pressure 120/76.  Advised to keep track of his blood pressures and if he does start having pressures that are more like they are here or at home, then we need to probably titrate up his lisinopril dose.  Otherwise with his pressure remained stable at home, will adjust further.  Continue current meds. Lisinopril 10 mg daily and Toprol (metoprolol succinate 25 mg twice daily (he is on Toprol TID and metoprolol tartrate to ensure full coverage with his history of PSVT and short burst of NSVT previously documented.Marland Kitchen

## 2022-01-20 NOTE — Assessment & Plan Note (Signed)
Doing well.  Decompensating without any issues.  Not requiring diuretic other than diuretic effect Jardiance (at diabetes dose).  On stable dose of Toprol and lisinopril.

## 2022-01-20 NOTE — Assessment & Plan Note (Signed)
On beta-blocker.  No recurrent symptoms at last block.  Discussed vagal relievers.  Also discussed either earlier dose of Toprol or additional dose of for breakthrough spells

## 2022-01-20 NOTE — Assessment & Plan Note (Signed)
His LDL was 70 last visit.  Stable.  1 to make sure to stay in the same direction.  Because would need to be more aggressive if not can continue on current meds.  not having any myalgias despite 80 mg atorvastatin

## 2022-02-07 DIAGNOSIS — E1165 Type 2 diabetes mellitus with hyperglycemia: Secondary | ICD-10-CM | POA: Diagnosis not present

## 2022-02-14 DIAGNOSIS — D692 Other nonthrombocytopenic purpura: Secondary | ICD-10-CM | POA: Diagnosis not present

## 2022-02-14 DIAGNOSIS — R809 Proteinuria, unspecified: Secondary | ICD-10-CM | POA: Diagnosis not present

## 2022-02-14 DIAGNOSIS — N1832 Chronic kidney disease, stage 3b: Secondary | ICD-10-CM | POA: Diagnosis not present

## 2022-02-14 DIAGNOSIS — I8390 Asymptomatic varicose veins of unspecified lower extremity: Secondary | ICD-10-CM | POA: Diagnosis not present

## 2022-02-14 DIAGNOSIS — E1165 Type 2 diabetes mellitus with hyperglycemia: Secondary | ICD-10-CM | POA: Diagnosis not present

## 2022-02-14 DIAGNOSIS — I130 Hypertensive heart and chronic kidney disease with heart failure and stage 1 through stage 4 chronic kidney disease, or unspecified chronic kidney disease: Secondary | ICD-10-CM | POA: Diagnosis not present

## 2022-02-14 DIAGNOSIS — E7800 Pure hypercholesterolemia, unspecified: Secondary | ICD-10-CM | POA: Diagnosis not present

## 2022-02-14 DIAGNOSIS — I255 Ischemic cardiomyopathy: Secondary | ICD-10-CM | POA: Diagnosis not present

## 2022-02-14 DIAGNOSIS — I25810 Atherosclerosis of coronary artery bypass graft(s) without angina pectoris: Secondary | ICD-10-CM | POA: Diagnosis not present

## 2022-02-14 DIAGNOSIS — E1129 Type 2 diabetes mellitus with other diabetic kidney complication: Secondary | ICD-10-CM | POA: Diagnosis not present

## 2022-02-14 DIAGNOSIS — Z794 Long term (current) use of insulin: Secondary | ICD-10-CM | POA: Diagnosis not present

## 2022-02-14 DIAGNOSIS — E663 Overweight: Secondary | ICD-10-CM | POA: Diagnosis not present

## 2022-03-06 DIAGNOSIS — R5383 Other fatigue: Secondary | ICD-10-CM | POA: Diagnosis not present

## 2022-03-06 DIAGNOSIS — R059 Cough, unspecified: Secondary | ICD-10-CM | POA: Diagnosis not present

## 2022-03-06 DIAGNOSIS — J0100 Acute maxillary sinusitis, unspecified: Secondary | ICD-10-CM | POA: Diagnosis not present

## 2022-03-06 DIAGNOSIS — Z1152 Encounter for screening for COVID-19: Secondary | ICD-10-CM | POA: Diagnosis not present

## 2022-03-09 DIAGNOSIS — L821 Other seborrheic keratosis: Secondary | ICD-10-CM | POA: Diagnosis not present

## 2022-03-09 DIAGNOSIS — L814 Other melanin hyperpigmentation: Secondary | ICD-10-CM | POA: Diagnosis not present

## 2022-03-09 DIAGNOSIS — D225 Melanocytic nevi of trunk: Secondary | ICD-10-CM | POA: Diagnosis not present

## 2022-03-09 DIAGNOSIS — L570 Actinic keratosis: Secondary | ICD-10-CM | POA: Diagnosis not present

## 2022-03-10 DIAGNOSIS — E1165 Type 2 diabetes mellitus with hyperglycemia: Secondary | ICD-10-CM | POA: Diagnosis not present

## 2022-04-10 DIAGNOSIS — E1165 Type 2 diabetes mellitus with hyperglycemia: Secondary | ICD-10-CM | POA: Diagnosis not present

## 2022-05-09 DIAGNOSIS — E1165 Type 2 diabetes mellitus with hyperglycemia: Secondary | ICD-10-CM | POA: Diagnosis not present

## 2022-05-16 DIAGNOSIS — E1129 Type 2 diabetes mellitus with other diabetic kidney complication: Secondary | ICD-10-CM | POA: Diagnosis not present

## 2022-05-16 DIAGNOSIS — I25810 Atherosclerosis of coronary artery bypass graft(s) without angina pectoris: Secondary | ICD-10-CM | POA: Diagnosis not present

## 2022-05-16 DIAGNOSIS — I130 Hypertensive heart and chronic kidney disease with heart failure and stage 1 through stage 4 chronic kidney disease, or unspecified chronic kidney disease: Secondary | ICD-10-CM | POA: Diagnosis not present

## 2022-05-16 DIAGNOSIS — Z794 Long term (current) use of insulin: Secondary | ICD-10-CM | POA: Diagnosis not present

## 2022-05-16 DIAGNOSIS — N1832 Chronic kidney disease, stage 3b: Secondary | ICD-10-CM | POA: Diagnosis not present

## 2022-05-18 DIAGNOSIS — L239 Allergic contact dermatitis, unspecified cause: Secondary | ICD-10-CM | POA: Diagnosis not present

## 2022-05-18 DIAGNOSIS — L298 Other pruritus: Secondary | ICD-10-CM | POA: Diagnosis not present

## 2022-05-18 DIAGNOSIS — L309 Dermatitis, unspecified: Secondary | ICD-10-CM | POA: Diagnosis not present

## 2022-05-18 DIAGNOSIS — L570 Actinic keratosis: Secondary | ICD-10-CM | POA: Diagnosis not present

## 2022-06-09 DIAGNOSIS — E1165 Type 2 diabetes mellitus with hyperglycemia: Secondary | ICD-10-CM | POA: Diagnosis not present

## 2022-06-12 DIAGNOSIS — L821 Other seborrheic keratosis: Secondary | ICD-10-CM | POA: Diagnosis not present

## 2022-06-12 DIAGNOSIS — L570 Actinic keratosis: Secondary | ICD-10-CM | POA: Diagnosis not present

## 2022-07-06 ENCOUNTER — Encounter: Payer: Self-pay | Admitting: Podiatry

## 2022-07-06 ENCOUNTER — Ambulatory Visit (INDEPENDENT_AMBULATORY_CARE_PROVIDER_SITE_OTHER): Payer: PPO | Admitting: Podiatry

## 2022-07-06 DIAGNOSIS — B353 Tinea pedis: Secondary | ICD-10-CM

## 2022-07-06 MED ORDER — TERBINAFINE HCL 250 MG PO TABS
250.0000 mg | ORAL_TABLET | Freq: Every day | ORAL | 0 refills | Status: DC
Start: 2022-07-06 — End: 2022-08-03

## 2022-07-06 NOTE — Progress Notes (Signed)
Subjective:  Patient ID: Taylor [name], male    DOB: April 30, 1950,  MRN: [id number] HPI Chief Complaint  Patient presents with   Skin Problem    Plantar bilateral - skin peeling, splotchy red, used lotions and got better, but now has been back for a month, developed after he got out of the hospital for pneumonia   New Patient (Initial Visit)    72 y.o. male presents with the above complaint.   ROS: Denies fever chills nausea vomit muscle aches pains calf pain back pain chest pain shortness of breath.  Past Medical History:  Diagnosis Date   Bigeminal rhythm 08/01/2017   per EKG 07-24-2017 in epic (per confirmation new since last ecg in 2007)  in care everywhere in epic ecg dated 2012 shows sinus brady rate 53 no pvc's --per pt asymptomatic and    Bradycardia    Coronary artery disease involving native coronary artery 08/2017   Abnormal Myoview 08/2017 (Abnormal EKG - Severe ST depressions, PVCs & nsVT). TID noted.  Moderate LAD territory Ischemic Perfusion defect --> Cath with extensive RCA (RPDA & RPAV), Cx & LAD disease --> CABG x 4   Family history of premature coronary artery disease    Father - MI 28. Paternal uncle x 2 -1 with MI & other with CABG in 51s   History of contusion    ED visit 08-04-2013 , 9 days past injury ,  dx concussion w/ LOC, per CT inferior frontal lobes contusion hemarrhage-- residual headache (08-01-2017  per pt residual resolved)   History of kidney stones    Hypercholesterolemia    Hypertension    Nephrolithiasis    bilateral nonobstructive per CT 07-24-2017   S/P CABG x 4 09/06/2017   LIMA to LAD, SVG to OM2, Sequential SVG to PDA and RPL2, EVH via right thigh and leg   Type 2 diabetes mellitus (HCC)    followed by pcp   Ureteral calculi    left   Wears glasses    Wears hearing aid in both ears    Past Surgical History:  Procedure Laterality Date   CORONARY ARTERY BYPASS GRAFT N/A 09/06/2017   Procedure: CORONARY ARTERY BYPASS GRAFTING (CABG) x  4, using left internal mammary artery and right leg greater saphenous vein harvested endoscopically;  Surgeon: Purcell Nails, MD;  Location: [facility];  Service: Open Heart Surgery;; LIMA-LAD, SVG-OM2, SeqSVG-RPDA-RPL2   CYSTOSCOPY WITH RETROGRADE PYELOGRAM, URETEROSCOPY AND STENT PLACEMENT Left 08/02/2018   Procedure: CYSTOSCOPY WITH RETROGRADE PYELOGRAM, URETEROSCOPY AND STENT PLACEMENT;  Surgeon: Malen Gauze, MD;  Location: [facility];  Service: Urology;  Laterality: Left;   CYSTOSCOPY/RETROGRADE/URETEROSCOPY Left 08/06/2017   Procedure: CYSTOSCOPY/RETROGRADE/URETEROSCOPY laser and stone basket extrection;  Surgeon: Malen Gauze, MD;  Location: [facility];  Service: Urology;  Laterality: Left;   HOLMIUM LASER APPLICATION Left 08/02/2018   Procedure: HOLMIUM LASER APPLICATION;  Surgeon: Malen Gauze, MD;  Location: [facility];  Service: Urology;  Laterality: Left;   LAPAROSCOPIC APPENDECTOMY  11-18-2005    dr wyatt  [facility]   LEFT HEART CATH AND CORONARY ANGIOGRAPHY N/A 09/03/2017   Procedure: LEFT HEART CATH AND CORONARY ANGIOGRAPHY;  Surgeon: Marykay Lex, MD;  Location: [facility] INVASIVE CV LAB;  Service: Cardiovascular;; mRCA 70%, dRCA 99%-95% into RPAV & 90% ostRPDA 95%. RPL1 70%, distal RVAM (tandem PDA) - 85%. Ost-prox LAD 40% -pLAD 60&90% @ D1 ->p-mLAD 95% @ D2 with 70% D2. pCx 80%, p-mCx 90%-> mCx 85%.  EF 45-50%, global  HK. -->> referred for CABG   NASAL ENDOSCOPY WITH EPISTAXIS CONTROL N/A 11/29/2017   Procedure: NASAL ENDOSCOPY WITH EPISTAXIS CONTROL;  Surgeon: Newman Pies, MD;  Location: [facility];  Service: ENT;  Laterality: N/A;   NM MYOVIEW LTD  08/31/2017   HIGH RISK: Combination Imaging & EKG:  with  in tmedium sized, moderate severity Reversible perfusion e apdefect in  the mid and apical anterior, apical lateral walls - FINDINGS ARE C/W ISCHEMIA in the LAD territory,  however given significant arrhythmias, severe ST depressions and TID =  MV CAD is  highly likely (SDS =7).    ORCHIECTOMY  1965   unilateral -- per pt ruptured   TEE WITHOUT CARDIOVERSION N/A 09/06/2017   Procedure: TRANSESOPHAGEAL ECHOCARDIOGRAM (TEE);  INTRA-OP.  Surgeon: Purcell Nails, MD;  Location: [facility] OR;  Service: Open Heart Surgery:;; Normal AoV, MV & TV.  Normal RV.   TRANSTHORACIC ECHOCARDIOGRAM  09/04/2017   (pre-CABG): EF 40-45%, diffuse HK.  Difficult to assess due to frequent PVCs. Severe LA dilation. Mildly increased PAP.   TRANSTHORACIC ECHOCARDIOGRAM  01/01/2018   Post CABG: EF moderately reduced 40 and 45%.  Diffuse HK.  No identifiable R WMA.  GRII DD with severe LA dilation.  (High filling pressures).  Moderate aortic calcification-aortic sclerosis with no stenosis.  Mildly elevated PA pressures.   TRANSTHORACIC ECHOCARDIOGRAM  11/17/2019   EF 50 to 55%.  No R WMA.  Mild G1 DD.  Moderate LA dilation.  Mild-moderate aortic valve sclerosis no stenosis.   UMBILICAL HERNIA REPAIR  2006   VARICOSE VEIN SURGERY  2007   left endovascular laser ablation    Current Outpatient Medications:    BD PEN NEEDLE NANO 2ND GEN 32G X 4 MM MISC, AS DIRECTED . WITH LANTUS DAILY 90 DAYS, Disp: , Rfl:    Lancets (ONETOUCH DELICA PLUS LANCET30G) MISC, USE TO TEST BLOOD SUGARS TWICE DAILY 90, Disp: , Rfl:    ONETOUCH VERIO test strip, 2 (two) times daily., Disp: , Rfl:    OZEMPIC, 0.25 OR 0.5 MG/DOSE, 2 MG/3ML SOPN, Inject 0.5 mg into the skin once a week., Disp: , Rfl:    OZEMPIC, 1 MG/DOSE, 4 MG/3ML SOPN, Inject 1 mg into the skin once a week., Disp: , Rfl:    terbinafine (LAMISIL) 250 MG tablet, Take 1 tablet (250 mg total) by mouth daily., Disp: 30 tablet, Rfl: 0   triamcinolone ointment (KENALOG) 0.1 %, Apply topically 2 (two) times daily as needed., Disp: , Rfl:    aspirin EC 81 MG tablet, Take 1 tablet (81 mg total) by mouth daily., Disp: 90 tablet, Rfl: 3   atorvastatin (LIPITOR) 80 MG tablet, Take 1 tablet (80 mg total) by mouth daily., Disp: 90 tablet, Rfl: 3    insulin glargine (LANTUS) 100 UNIT/ML injection, Inject 15 Units into the skin daily., Disp: , Rfl:    JARDIANCE 25 MG TABS tablet, Take 25 mg by mouth daily., Disp: , Rfl:    lisinopril (ZESTRIL) 10 MG tablet, Take 10 mg by mouth daily., Disp: , Rfl:    metFORMIN (GLUCOPHAGE) 500 MG tablet, Take 500 mg by mouth 2 (two) times daily., Disp: , Rfl:    metoprolol succinate (TOPROL-XL) 25 MG 24 hr tablet, Take 1 tablet (25 mg total) by mouth 2 (two) times daily., Disp: 180 tablet, Rfl: 3   Multiple Vitamin (MULTIVITAMIN) tablet, Take 1 tablet by mouth daily., Disp: , Rfl:   No Known Allergies Review of Systems Objective:  There were no vitals filed for this visit.  General: Well developed, nourished, in no acute distress, alert and oriented x3   Dermatological: Skin is warm, dry and supple bilateral. Nails x 10 are well maintained; remaining integument appears unremarkable at this time. There are no open sores, no preulcerative lesions, no rash or signs of infection present.  Moccasin distribution what appears to be tinea pedis with the scalloped margins along the medial external arch and actually sitting up the posterior aspect of the Achilles.  Vascular: Dorsalis Pedis artery and Posterior Tibial artery pedal pulses are 2/4 bilateral with immedate capillary fill time. Pedal hair growth present. No varicosities and no lower extremity edema present bilateral.   Neruologic: Grossly intact via light touch bilateral. Vibratory intact via tuning fork bilateral. Protective threshold with Semmes Wienstein monofilament intact to all pedal sites bilateral. Patellar and Achilles deep tendon reflexes 2+ bilateral. No Babinski or clonus noted bilateral.   Musculoskeletal: No gross boney pedal deformities bilateral. No pain, crepitus, or limitation noted with foot and ankle range of motion bilateral. Muscular strength 5/5 in all groups tested bilateral.  Gait: Unassisted, Nonantalgic.     Radiographs:  None taken  Assessment & Plan:   Assessment: Tinea pedis  Plan: Started him on Lamisil 250 mg tablets.  He will take 1 tablet daily.  Follow-up with him in 1 months  Rheba Diamond T. [place], [place]

## 2022-07-09 DIAGNOSIS — E1165 Type 2 diabetes mellitus with hyperglycemia: Secondary | ICD-10-CM | POA: Diagnosis not present

## 2022-08-03 ENCOUNTER — Ambulatory Visit (INDEPENDENT_AMBULATORY_CARE_PROVIDER_SITE_OTHER): Payer: PPO | Admitting: Podiatry

## 2022-08-03 ENCOUNTER — Encounter: Payer: Self-pay | Admitting: Podiatry

## 2022-08-03 DIAGNOSIS — B353 Tinea pedis: Secondary | ICD-10-CM | POA: Diagnosis not present

## 2022-08-03 MED ORDER — TERBINAFINE HCL 250 MG PO TABS: 250.0000 mg | ORAL_TABLET | Freq: Every day | ORAL | 1 refills | Status: AC

## 2022-08-03 NOTE — Progress Notes (Signed)
He presents today for follow-up of his athlete's foot he denies some problems taking the medication.  States is doing much better just has not completely gone away yet denies fever chills nausea vomit muscle aches pains.  Check: Vital signs are stable he is alert oriented x 3 pulses are palpable.  There is no erythema edema cellulitis drainage or odor tinea pedis appears to be resolving onychomycosis still present.  Assessment: Pain limb secondary to onychomycosis and tinea pedis.  Plan: Another 30 days of Lamisil at this point.  Questions or concerns he will notify us immediately we did once again discussed possible side effects associated with this he is aware of and I will follow-up with him in 1 month

## 2022-08-08 DIAGNOSIS — Z125 Encounter for screening for malignant neoplasm of prostate: Secondary | ICD-10-CM | POA: Diagnosis not present

## 2022-08-08 DIAGNOSIS — E1129 Type 2 diabetes mellitus with other diabetic kidney complication: Secondary | ICD-10-CM | POA: Diagnosis not present

## 2022-08-08 DIAGNOSIS — E7800 Pure hypercholesterolemia, unspecified: Secondary | ICD-10-CM | POA: Diagnosis not present

## 2022-08-09 DIAGNOSIS — E1165 Type 2 diabetes mellitus with hyperglycemia: Secondary | ICD-10-CM | POA: Diagnosis not present

## 2022-08-15 DIAGNOSIS — I25810 Atherosclerosis of coronary artery bypass graft(s) without angina pectoris: Secondary | ICD-10-CM | POA: Diagnosis not present

## 2022-08-15 DIAGNOSIS — R82998 Other abnormal findings in urine: Secondary | ICD-10-CM | POA: Diagnosis not present

## 2022-08-15 DIAGNOSIS — I255 Ischemic cardiomyopathy: Secondary | ICD-10-CM | POA: Diagnosis not present

## 2022-08-15 DIAGNOSIS — E1165 Type 2 diabetes mellitus with hyperglycemia: Secondary | ICD-10-CM | POA: Diagnosis not present

## 2022-08-15 DIAGNOSIS — N1832 Chronic kidney disease, stage 3b: Secondary | ICD-10-CM | POA: Diagnosis not present

## 2022-08-15 DIAGNOSIS — Z0000 Encounter for general adult medical examination without abnormal findings: Secondary | ICD-10-CM | POA: Diagnosis not present

## 2022-08-15 DIAGNOSIS — Z1212 Encounter for screening for malignant neoplasm of rectum: Secondary | ICD-10-CM | POA: Diagnosis not present

## 2022-08-15 DIAGNOSIS — E663 Overweight: Secondary | ICD-10-CM | POA: Diagnosis not present

## 2022-08-15 DIAGNOSIS — D692 Other nonthrombocytopenic purpura: Secondary | ICD-10-CM | POA: Diagnosis not present

## 2022-08-15 DIAGNOSIS — Z794 Long term (current) use of insulin: Secondary | ICD-10-CM | POA: Diagnosis not present

## 2022-08-15 DIAGNOSIS — Z1339 Encounter for screening examination for other mental health and behavioral disorders: Secondary | ICD-10-CM | POA: Diagnosis not present

## 2022-08-15 DIAGNOSIS — E1129 Type 2 diabetes mellitus with other diabetic kidney complication: Secondary | ICD-10-CM | POA: Diagnosis not present

## 2022-08-15 DIAGNOSIS — I130 Hypertensive heart and chronic kidney disease with heart failure and stage 1 through stage 4 chronic kidney disease, or unspecified chronic kidney disease: Secondary | ICD-10-CM | POA: Diagnosis not present

## 2022-08-15 DIAGNOSIS — E7800 Pure hypercholesterolemia, unspecified: Secondary | ICD-10-CM | POA: Diagnosis not present

## 2022-08-15 DIAGNOSIS — Z1331 Encounter for screening for depression: Secondary | ICD-10-CM | POA: Diagnosis not present

## 2022-09-07 DIAGNOSIS — L570 Actinic keratosis: Secondary | ICD-10-CM | POA: Diagnosis not present

## 2022-09-08 DIAGNOSIS — E1165 Type 2 diabetes mellitus with hyperglycemia: Secondary | ICD-10-CM | POA: Diagnosis not present

## 2022-09-27 DIAGNOSIS — H5203 Hypermetropia, bilateral: Secondary | ICD-10-CM | POA: Diagnosis not present

## 2022-09-27 DIAGNOSIS — H25012 Cortical age-related cataract, left eye: Secondary | ICD-10-CM | POA: Diagnosis not present

## 2022-09-27 DIAGNOSIS — E119 Type 2 diabetes mellitus without complications: Secondary | ICD-10-CM | POA: Diagnosis not present

## 2022-09-27 DIAGNOSIS — H2513 Age-related nuclear cataract, bilateral: Secondary | ICD-10-CM | POA: Diagnosis not present

## 2022-10-05 ENCOUNTER — Ambulatory Visit: Payer: PPO | Admitting: Podiatrist

## 2022-10-05 ENCOUNTER — Encounter: Payer: Self-pay | Admitting: Podiatrist

## 2022-10-05 DIAGNOSIS — B353 Tinea pedis: Secondary | ICD-10-CM

## 2022-10-05 MED ORDER — KETOCONAZOLE 2 % EX CREA: TOPICAL_CREAM | CUTANEOUS | 2 refills | Status: AC

## 2022-10-05 NOTE — Progress Notes (Signed)
Chief Complaint  Patient presents with   Skin Problem    Follow up tinea bilateral   "I took the medication he gave me and think its pretty much cleared up"     HPI: Patient is 72 y.o. male who presents today for recheck of tinea pedis bilateral feet.  Patient relates that he took the medication/Lamisil in the skin infection is resolved.  Relates no peeling of the skin.  No recurrence is noted.   No Known Allergies  Review of systems is negative except as noted in the HPI.  Denies nausea/ vomiting/ fevers/ chills or night sweats.   Denies difficulty breathing, denies calf pain or tenderness  Physical Exam  Patient is awake, alert, and oriented x 3.  In no acute distress.    Vascular status is intact with palpable pedal pulses DP and PT bilateral and capillary refill time less than 3 seconds bilateral.  No edema or erythema noted.   Neurological exam reveals epicritic and protective sensation grossly intact bilateral.   Dermatological exam reveals skin is supple and dry to bilateral feet.  No sign of moccasin like tinea pedis is noted on exam.  No interdigital maceration is noted.  Musculoskeletal exam: Musculature intact with dorsiflexion, plantarflexion, inversion, eversion. Ankle and First MPJ joint range of motion normal.    Assessment:   ICD-10-CM   1. Tinea pedis of both feet  B35.3        Plan: Discussed exam findings with the patient at this point it seems like his tinea infection is fully cleared.  Recommended he keep an eye on his feet and if any recurrence of scaling or itchiness return he will apply ketoconazole.  If this fails to improve the condition he will call to be seen again.  Also gave recommendations for general foot creams that he can use on his feet.  He will return in the future on as-needed basis.

## 2022-10-09 DIAGNOSIS — E1165 Type 2 diabetes mellitus with hyperglycemia: Secondary | ICD-10-CM | POA: Diagnosis not present

## 2022-10-20 ENCOUNTER — Other Ambulatory Visit: Payer: Self-pay | Admitting: Cardiology

## 2022-11-09 DIAGNOSIS — E1165 Type 2 diabetes mellitus with hyperglycemia: Secondary | ICD-10-CM | POA: Diagnosis not present

## 2022-11-22 DIAGNOSIS — Z23 Encounter for immunization: Secondary | ICD-10-CM | POA: Diagnosis not present

## 2022-11-22 DIAGNOSIS — N1832 Chronic kidney disease, stage 3b: Secondary | ICD-10-CM | POA: Diagnosis not present

## 2022-11-22 DIAGNOSIS — E1129 Type 2 diabetes mellitus with other diabetic kidney complication: Secondary | ICD-10-CM | POA: Diagnosis not present

## 2022-11-22 DIAGNOSIS — I130 Hypertensive heart and chronic kidney disease with heart failure and stage 1 through stage 4 chronic kidney disease, or unspecified chronic kidney disease: Secondary | ICD-10-CM | POA: Diagnosis not present

## 2022-11-22 DIAGNOSIS — I25810 Atherosclerosis of coronary artery bypass graft(s) without angina pectoris: Secondary | ICD-10-CM | POA: Diagnosis not present

## 2022-11-22 DIAGNOSIS — Z794 Long term (current) use of insulin: Secondary | ICD-10-CM | POA: Diagnosis not present

## 2022-12-09 DIAGNOSIS — E1165 Type 2 diabetes mellitus with hyperglycemia: Secondary | ICD-10-CM | POA: Diagnosis not present

## 2023-01-09 DIAGNOSIS — E1165 Type 2 diabetes mellitus with hyperglycemia: Secondary | ICD-10-CM | POA: Diagnosis not present

## 2023-01-19 ENCOUNTER — Other Ambulatory Visit: Payer: Self-pay | Admitting: Cardiology

## 2023-02-08 DIAGNOSIS — E1165 Type 2 diabetes mellitus with hyperglycemia: Secondary | ICD-10-CM | POA: Diagnosis not present

## 2023-02-14 ENCOUNTER — Other Ambulatory Visit: Payer: Self-pay | Admitting: Cardiology

## 2023-02-15 NOTE — Telephone Encounter (Signed)
 Please contact pt for future appointment. Pt due for 12 month f/u.

## 2023-02-23 ENCOUNTER — Other Ambulatory Visit: Payer: Self-pay | Admitting: Cardiology

## 2023-02-23 DIAGNOSIS — E7800 Pure hypercholesterolemia, unspecified: Secondary | ICD-10-CM | POA: Diagnosis not present

## 2023-02-23 DIAGNOSIS — N1832 Chronic kidney disease, stage 3b: Secondary | ICD-10-CM | POA: Diagnosis not present

## 2023-02-23 DIAGNOSIS — I129 Hypertensive chronic kidney disease with stage 1 through stage 4 chronic kidney disease, or unspecified chronic kidney disease: Secondary | ICD-10-CM | POA: Diagnosis not present

## 2023-02-23 DIAGNOSIS — I255 Ischemic cardiomyopathy: Secondary | ICD-10-CM | POA: Diagnosis not present

## 2023-02-23 DIAGNOSIS — E1129 Type 2 diabetes mellitus with other diabetic kidney complication: Secondary | ICD-10-CM | POA: Diagnosis not present

## 2023-02-23 DIAGNOSIS — D692 Other nonthrombocytopenic purpura: Secondary | ICD-10-CM | POA: Diagnosis not present

## 2023-02-23 DIAGNOSIS — Z9981 Dependence on supplemental oxygen: Secondary | ICD-10-CM | POA: Diagnosis not present

## 2023-02-23 DIAGNOSIS — I25810 Atherosclerosis of coronary artery bypass graft(s) without angina pectoris: Secondary | ICD-10-CM | POA: Diagnosis not present

## 2023-02-23 DIAGNOSIS — E1165 Type 2 diabetes mellitus with hyperglycemia: Secondary | ICD-10-CM | POA: Diagnosis not present

## 2023-02-23 DIAGNOSIS — Z794 Long term (current) use of insulin: Secondary | ICD-10-CM | POA: Diagnosis not present

## 2023-02-23 DIAGNOSIS — E66811 Obesity, class 1: Secondary | ICD-10-CM | POA: Diagnosis not present

## 2023-03-11 DIAGNOSIS — E1165 Type 2 diabetes mellitus with hyperglycemia: Secondary | ICD-10-CM | POA: Diagnosis not present

## 2023-03-14 DIAGNOSIS — I872 Venous insufficiency (chronic) (peripheral): Secondary | ICD-10-CM | POA: Diagnosis not present

## 2023-03-14 DIAGNOSIS — D225 Melanocytic nevi of trunk: Secondary | ICD-10-CM | POA: Diagnosis not present

## 2023-03-14 DIAGNOSIS — L814 Other melanin hyperpigmentation: Secondary | ICD-10-CM | POA: Diagnosis not present

## 2023-03-14 DIAGNOSIS — D492 Neoplasm of unspecified behavior of bone, soft tissue, and skin: Secondary | ICD-10-CM | POA: Diagnosis not present

## 2023-03-14 DIAGNOSIS — L570 Actinic keratosis: Secondary | ICD-10-CM | POA: Diagnosis not present

## 2023-03-14 DIAGNOSIS — L821 Other seborrheic keratosis: Secondary | ICD-10-CM | POA: Diagnosis not present

## 2023-03-19 ENCOUNTER — Other Ambulatory Visit: Payer: Self-pay | Admitting: Cardiology

## 2023-04-11 DIAGNOSIS — E1165 Type 2 diabetes mellitus with hyperglycemia: Secondary | ICD-10-CM | POA: Diagnosis not present

## 2023-04-21 ENCOUNTER — Other Ambulatory Visit: Payer: Self-pay | Admitting: Cardiology

## 2023-04-24 ENCOUNTER — Encounter: Payer: Self-pay | Admitting: Podiatry

## 2023-04-24 ENCOUNTER — Ambulatory Visit (INDEPENDENT_AMBULATORY_CARE_PROVIDER_SITE_OTHER)

## 2023-04-24 ENCOUNTER — Ambulatory Visit: Payer: PPO | Admitting: Podiatry

## 2023-04-24 DIAGNOSIS — M2042 Other hammer toe(s) (acquired), left foot: Secondary | ICD-10-CM | POA: Diagnosis not present

## 2023-04-24 DIAGNOSIS — D2372 Other benign neoplasm of skin of left lower limb, including hip: Secondary | ICD-10-CM

## 2023-04-24 NOTE — Progress Notes (Signed)
 He presents today after having not seen him for a year or so with a chief concern of a painful fifth digit of the left foot.  States is got a callus on the medial side of the fifth toe left foot and states that is really only sore at nighttime when he is laying in bed on his left side at this pressure on the foot.  He says it really does not hurt with shoes and there is no treatment for this and his hemoglobin A1c is 7.7%.  Objective: Vital signs are stable alert oriented x 3.  Pulses are palpable.  Neurologic sensorium is intact Deetjen reflexes are intact muscle strength is normal symmetrical.  Orthopedic evaluation demonstrates all joints distal to the ankle, full range of motion without crepitation.  He does have a thickened PIPJ with prominence on the medial aspect of the head of the proximal phalanx this is resulted in a reactive hyperkeratotic lesion.  I debrided that today does not demonstrate any type of dysplasia.  Radiographs taken today demonstrate an osseously mature individual good bone mineralization fifth toe does demonstrate some deformity at the level of the PIPJ including what appears to be a displaced head that has healed and malposition as well as osteoarthritic changes at the PIPJ particularly the base of the middle phalanx.  Most likely this abnormality is resulting in his callus formation.  Assessment: Digital deformity fifth digit left foot secondary to old fracture resulting in his reactive hyperkeratotic lesion.  Plan: Debridement of reactive hyperkeratotic lesion discussed the need for a derotational arthroplasty fifth digit left foot.  He is will follow-up with me once his wife has finished up with her surgery.

## 2023-05-04 ENCOUNTER — Other Ambulatory Visit: Payer: Self-pay | Admitting: Cardiology

## 2023-05-09 DIAGNOSIS — E1165 Type 2 diabetes mellitus with hyperglycemia: Secondary | ICD-10-CM | POA: Diagnosis not present

## 2023-05-13 ENCOUNTER — Other Ambulatory Visit: Payer: Self-pay | Admitting: Cardiology

## 2023-05-14 ENCOUNTER — Telehealth: Payer: Self-pay | Admitting: Cardiology

## 2023-05-14 ENCOUNTER — Other Ambulatory Visit: Payer: Self-pay

## 2023-05-14 MED ORDER — JARDIANCE 25 MG PO TABS: 25.0000 mg | ORAL_TABLET | Freq: Every day | ORAL | 0 refills | Status: AC

## 2023-05-14 NOTE — Telephone Encounter (Signed)
 Pt's medication was sent to pt's pharmacy as requested. Confirmation received.

## 2023-05-14 NOTE — Telephone Encounter (Signed)
*  STAT* If patient is at the pharmacy, call can be transferred to refill team.   1. Which medications need to be refilled? (please list name of each medication and dose if known)  JARDIANCE 25 MG TABS tablet   2. Which pharmacy/location (including street and city if local pharmacy) is medication to be sent to? [id number] - , [place] - [address]   3. Do they need a 30 day or 90 day supply? 90 day supply  Patient is scheduled for 4/07 with Dr. Herbie Baltimore.

## 2023-05-21 ENCOUNTER — Ambulatory Visit: Attending: Cardiology | Admitting: Cardiology

## 2023-05-21 ENCOUNTER — Encounter: Payer: Self-pay | Admitting: Cardiology

## 2023-05-21 VITALS — BP 130/70 | HR 75 | Ht 71.0 in | Wt 217.0 lb

## 2023-05-21 DIAGNOSIS — I2510 Atherosclerotic heart disease of native coronary artery without angina pectoris: Secondary | ICD-10-CM | POA: Diagnosis not present

## 2023-05-21 DIAGNOSIS — I498 Other specified cardiac arrhythmias: Secondary | ICD-10-CM | POA: Diagnosis not present

## 2023-05-21 DIAGNOSIS — I255 Ischemic cardiomyopathy: Secondary | ICD-10-CM | POA: Diagnosis not present

## 2023-05-21 DIAGNOSIS — I10 Essential (primary) hypertension: Secondary | ICD-10-CM | POA: Diagnosis not present

## 2023-05-21 DIAGNOSIS — E785 Hyperlipidemia, unspecified: Secondary | ICD-10-CM

## 2023-05-21 DIAGNOSIS — Z951 Presence of aortocoronary bypass graft: Secondary | ICD-10-CM

## 2023-05-21 DIAGNOSIS — E1169 Type 2 diabetes mellitus with other specified complication: Secondary | ICD-10-CM | POA: Diagnosis not present

## 2023-05-21 MED ORDER — ATORVASTATIN CALCIUM 80 MG PO TABS: 80.0000 mg | ORAL_TABLET | Freq: Every day | ORAL | 3 refills | Status: AC

## 2023-05-21 NOTE — Patient Instructions (Signed)
 Medication Instructions:   Not needed *If you need a refill on your cardiac medications before your next appointment, please call your pharmacy*   Lab Work: Not needed    Testing/Procedures:  Not needed  Follow-Up: At [facility], you and your health needs are our priority.  As part of our continuing mission to provide you with exceptional heart care, we have created designated Provider Care Teams.  These Care Teams include your primary Cardiologist (physician) and Advanced Practice Providers (APPs -  Physician Assistants and Nurse Practitioners) who all work together to provide you with the care you need, when you need it.     Your next appointment:   7 month(s)  The format for your next appointment:   In Person  Provider:   Bryan Lemma, MD  or Edd Fabian, FNP, Bernadene Person, NP, or Reather Littler, NP    Then, Bryan Lemma, MD will plan to see you again Nov 2026.   Other Instructions  s

## 2023-05-21 NOTE — Progress Notes (Signed)
 Cardiology Office Note:  .   Date:  05/26/2023  ID:  Georgian Kirk, DOB 1950-12-08, MRN [id number] PCP: Suzzanne Estrin, MD  [facility] Providers Cardiologist:  Randene Bustard, MD Electrophysiologist:  Will Cortland Ding, MD     Chief Complaint  Patient presents with   Follow-up    Doing well.  No major issues   Coronary Artery Disease    No angina   Referred at the request of [place], Kristina Pfeiffer, MD.  Patient Profile: .     LARRON ARMOR is a  73 y.o. male  with a PMH notable for MV- CAD (CABG), ~ NICM, PVCs, HTN. HLD, DM-2 who presents here for 18 month f/u   June 2019:[place] was initially seen for ventricular bigeminy/bradycardia and a strong family history of CAD.   --> Sx: DOE/Angina (waling up hill) ->  High Risk Myoview with anterior ischemia--Cath w/ MV CAD--> CABGx4 -  LIMA-LAD, SVG-OM2, SeqSVG-PDA-RPL2 (July 2019).  -> Postop monitor showed PSVT (initially considered to be A. Fib -> EP evaluation suggested AVNRT.)  Monitor was ordered for follow-up of preop ventricular bigeminy HTN & HLD    BAYLER GEHRIG was last seen in Nov 2023: Was finally recovering from COVID symptoms and ended up.  Was improving.  Probably as result of his long-term dealing with COVID he lost his job at [employer].  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  He was happy with this and keeps him busy.  He was able to remain active and denied any cardiac symptoms.  No medication changes made, we just refilled metoprolol and atorvastatin.  Subjective  Discussed the use of AI scribe software for clinical note transcription with the patient, who gave verbal consent to proceed.  History of Present Illness History of Present Illness SAKAI WOLFORD is a 73 year old male with coronary artery disease and a history of  CABG who presents for an 29-month follow-up.  No recent episodes of shortness of breath, chest pain, or palpitations. Current medications include Toprol 25 mg twice a day, lisinopril 10 mg, aspirin 81 mg, and atorvastatin 80 mg. He ran out of atorvastatin as of today but has been compliant with his medication regimen until now.  He has a history of frequent premature ventricular contractions (PVCs) and short bursts of paroxysmal supraventricular tachycardia (PSVT). These symptoms have subsided, with no recent palpitations, dizziness, or syncope.  Hypertension is well-controlled with lisinopril 10 mg. No recent changes in blood pressure readings.  Hyperlipidemia is managed with atorvastatin 80 mg. Previous cholesterol levels were well-controlled, but he ran out of atorvastatin today.  Type 2 diabetes mellitus is managed with Lantus at night, Jardiance 25 mg, metformin 500 mg twice a day, and Ozempic. His last A1c was 6.9 in January. He has not experienced weight loss with Ozempic and mentions a slight weight gain.  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 He works four days a week from 7 AM to 3:30 PM and remains active outside of work. No recent illnesses, fevers, chills, or changes in vision. No joint pain, swelling, or fatigue. Cardiovascular ROS: no chest pain or dyspnea on exertion negative for - edema, irregular heartbeat, orthopnea, palpitations, paroxysmal nocturnal dyspnea, rapid  heart rate, shortness of breath, or syncope/near syncope, TIA/CVA/amaurosis fugax, claudication    Objective   Medications - Toprol 25 mg twice a day; - Lisinopril 10 mg - Aspirin 81 mg; - Atorvastatin 80 mg - Lantus 15 Units at bedtime;- Jardiance 25 mg daily; - Glucophage 500 mg twice a day; - Ozempic 4mg /13ml - 1mg  weekly  Studies Reviewed: Marland Kitchen   EKG Interpretation Date/Time:  Monday May 21 2023 11:39:16 EDT Ventricular Rate:  75 PR  Interval:  158 QRS Duration:  98 QT Interval:  396 QTC Calculation: 442 R Axis:   21  Text Interpretation: Sinus rhythm with occasional Premature ventricular complexes Low voltage QRS Nonspecific T wave abnormality When compared with ECG of 16-Nov-2019 01:00, Premature ventricular complexes are now Present Confirmed by Bryan Lemma ([id number]) on 05/21/2023 11:43:35 AM    LABS Cholesterol: Total: 109 mg/dL, Triglycerides: 68 mg/dL, HDL: 31 mg/dL, LDL: 64 mg/dL (43/3295) J8A: 4.1% (66/0630)  ECHO 11/17/2019: EF estimated 50 to 55% with abnormal septal motion but otherwise no RWMA.  Mild LVH, indeterminate diastolic parameters.  Moderate LA dilation.  Normal RV.  Normal RVP RAP CATH 09/03/2017 (for Abnormal Myoview/Exertional Angina): Ost-prox LAD 40%, prox LAD 60% @ D1 w/ ost 90%, prox-mid LAD 95% @ D2 w/ ost 70%; Prox LCX 80%, prox-mid LCx 90% & mid LCx 85%; Mid RCA 70%, dist RCA 99%-< ostRPDA 90% & 95% ost PAV, RPL1 70%   Risk Assessment/Calculations:    Physical Exam:   VS:  BP 130/70   Pulse 75   Ht 5\' 11"  (1.803 m)   Wt 217 lb (98.4 kg)   SpO2 97%   BMI 30.27 kg/m    Wt Readings from Last 3 Encounters:  05/21/23 217 lb (98.4 kg)  12/23/21 214 lb 9.6 oz (97.3 kg)  03/18/20 214 lb 6.4 oz (97.3 kg)    GEN: Well nourished, well developed in no acute distress; borderline obese  NECK: No JVD; No carotid bruits CARDIAC: Normal S1, S2; RRR, no murmurs, rubs, gallops RESPIRATORY:  Clear to auscultation without rales, wheezing or rhonchi ; nonlabored, good air movement. ABDOMEN: Soft, non-tender, non-distended EXTREMITIES:  No edema; No deformity      ASSESSMENT AND PLAN: .    Problem List Items Addressed This Visit       Cardiology Problems   Coronary artery disease involving native coronary artery of native heart without angina pectoris (Chronic)   Coronary Artery Disease (CAD) status post Coronary Artery Bypass Grafting (CABG) CAD stable post-CABG with no recent symptoms.  Medication regimen effective. No ongoing anginal symptoms.  Doing well.   Should be time to consider surveillance stress testing prior to his annual follow-up.  Can be discussed in November follow-up - Refill atorvastatin 80 mg prescription. - Continue aspirin 81 mg daily. - Continue metoprolol 25 mg twice daily, and lisinopril 10 Miller daily.       Relevant Medications   atorvastatin (LIPITOR) 80 MG tablet   Essential hypertension - Primary (Chronic)   Hypertension well-controlled with current regimen. - Continue lisinopril 10 mg daily, and Toprol-XL 25 mg twice daily      Relevant Medications   atorvastatin (LIPITOR) 80 MG tablet   Other Relevant Orders   EKG 12-Lead (Completed)   Hyperlipidemia associated with type 2 diabetes mellitus (HCC) (Chronic)   Hyperlipidemia managed with atorvastatin. LDL previously well-controlled. - Refill atorvastatin 80 mg prescription. - Ensure cholesterol levels are checked in June with Dr. Wylene Simmer.  Type 2 diabetes well-controlled with current medications.  A1c at 6.9%. - Continue Lantus at night. - Continue Jardiance 25 mg daily. - Continue metformin 500 mg twice daily. - Continue Ozempic as prescribed.      Relevant Medications   atorvastatin (LIPITOR) 80 MG tablet   Ischemic cardiomyopathy - pre-CABG ECHO: 40-45% (Chronic)   EF back in the low normal range after CABG revascularization.  -On stable dose of ACE inhibitor-lisinopril 10 Miller daily, and beta-blocker Toprol 25 mg twice daily along with Jardiance 25 mg daily.  -NYHA class I symptoms/asymptomatic and euvolemic on exam: No diuretic requirement      Relevant Medications   atorvastatin (LIPITOR) 80 MG tablet   Ventricular bigeminy (Chronic)   He has a history of frequent premature ventricular contractions (PVCs) and short bursts of paroxysmal supraventricular tachycardia (PSVT). These symptoms have subsided, with no recent palpitations, dizziness, or syncope. - Well-controlled  with current dose of Toprol Succinate 25 mg twice daily      Relevant Medications   atorvastatin (LIPITOR) 80 MG tablet     Other   S/P CABG x 4 (Chronic)   He is completely asymptomatic and doing well since CABG, however should be due for surveillance stress testing prior to annual follow-up.  Can be discussed in November at 63-month follow-up.   - Consider Stress PET versus Myoview for next spring prior to annual follow-up        Follow-Up: Return in about 7 months (around 12/21/2023) for Alternate 6 month follow-up with APP & MD. - Schedule follow-up appointment in November. - If unable to see primary cardiologist, schedule with PA or nurse practitioner.     Signed, Arleen Lacer, MD, MS Randene Bustard, M.D., M.S. Interventional Cardiologist  [facility]  Pager # [phone] Phone # [phone] [address] [place], [place] [address]

## 2023-05-26 ENCOUNTER — Encounter: Payer: Self-pay | Admitting: Cardiology

## 2023-05-26 NOTE — Assessment & Plan Note (Signed)
 Coronary Artery Disease (CAD) status post Coronary Artery Bypass Grafting (CABG) CAD stable post-CABG with no recent symptoms. Medication regimen effective. No ongoing anginal symptoms.  Doing well.   Should be time to consider surveillance stress testing prior to his annual follow-up.  Can be discussed in November follow-up - Refill atorvastatin 80 mg prescription. - Continue aspirin 81 mg daily. - Continue metoprolol 25 mg twice daily, and lisinopril 10 Miller daily.

## 2023-05-26 NOTE — Assessment & Plan Note (Signed)
 He has a history of frequent premature ventricular contractions (PVCs) and short bursts of paroxysmal supraventricular tachycardia (PSVT). These symptoms have subsided, with no recent palpitations, dizziness, or syncope. - Well-controlled with current dose of Toprol Succinate 25 mg twice daily

## 2023-05-26 NOTE — Assessment & Plan Note (Signed)
 EF back in the low normal range after CABG revascularization.  -On stable dose of ACE inhibitor-lisinopril 10 Miller daily, and beta-blocker Toprol 25 mg twice daily along with Jardiance 25 mg daily.  -NYHA class I symptoms/asymptomatic and euvolemic on exam: No diuretic requirement

## 2023-05-26 NOTE — Assessment & Plan Note (Addendum)
 Hypertension well-controlled with current regimen. - Continue lisinopril 10 mg daily, and Toprol-XL 25 mg twice daily

## 2023-05-26 NOTE — Assessment & Plan Note (Signed)
 He is completely asymptomatic and doing well since CABG, however should be due for surveillance stress testing prior to annual follow-up.  Can be discussed in November at 87-month follow-up.   - Consider Stress PET versus Myoview for next spring prior to annual follow-up

## 2023-05-26 NOTE — Assessment & Plan Note (Signed)
 Hyperlipidemia managed with atorvastatin. LDL previously well-controlled. - Refill atorvastatin 80 mg prescription. - Ensure cholesterol levels are checked in June with Dr. Tisovec.  Type 2 diabetes well-controlled with current medications. A1c at 6.9%. - Continue Lantus at night. - Continue Jardiance 25 mg daily. - Continue metformin 500 mg twice daily. - Continue Ozempic as prescribed.

## 2023-05-29 DIAGNOSIS — Z794 Long term (current) use of insulin: Secondary | ICD-10-CM | POA: Diagnosis not present

## 2023-05-29 DIAGNOSIS — I130 Hypertensive heart and chronic kidney disease with heart failure and stage 1 through stage 4 chronic kidney disease, or unspecified chronic kidney disease: Secondary | ICD-10-CM | POA: Diagnosis not present

## 2023-05-29 DIAGNOSIS — I25810 Atherosclerosis of coronary artery bypass graft(s) without angina pectoris: Secondary | ICD-10-CM | POA: Diagnosis not present

## 2023-05-29 DIAGNOSIS — N1832 Chronic kidney disease, stage 3b: Secondary | ICD-10-CM | POA: Diagnosis not present

## 2023-05-29 DIAGNOSIS — E1129 Type 2 diabetes mellitus with other diabetic kidney complication: Secondary | ICD-10-CM | POA: Diagnosis not present

## 2023-06-09 DIAGNOSIS — E1165 Type 2 diabetes mellitus with hyperglycemia: Secondary | ICD-10-CM | POA: Diagnosis not present

## 2023-06-17 ENCOUNTER — Other Ambulatory Visit: Payer: Self-pay | Admitting: Cardiology

## 2023-07-09 DIAGNOSIS — E1165 Type 2 diabetes mellitus with hyperglycemia: Secondary | ICD-10-CM | POA: Diagnosis not present

## 2023-08-09 DIAGNOSIS — E1165 Type 2 diabetes mellitus with hyperglycemia: Secondary | ICD-10-CM | POA: Diagnosis not present

## 2023-08-15 DIAGNOSIS — N1832 Chronic kidney disease, stage 3b: Secondary | ICD-10-CM | POA: Diagnosis not present

## 2023-08-15 DIAGNOSIS — Z1212 Encounter for screening for malignant neoplasm of rectum: Secondary | ICD-10-CM | POA: Diagnosis not present

## 2023-08-15 DIAGNOSIS — Z125 Encounter for screening for malignant neoplasm of prostate: Secondary | ICD-10-CM | POA: Diagnosis not present

## 2023-08-15 DIAGNOSIS — I129 Hypertensive chronic kidney disease with stage 1 through stage 4 chronic kidney disease, or unspecified chronic kidney disease: Secondary | ICD-10-CM | POA: Diagnosis not present

## 2023-08-15 DIAGNOSIS — E7800 Pure hypercholesterolemia, unspecified: Secondary | ICD-10-CM | POA: Diagnosis not present

## 2023-08-15 DIAGNOSIS — I25810 Atherosclerosis of coronary artery bypass graft(s) without angina pectoris: Secondary | ICD-10-CM | POA: Diagnosis not present

## 2023-08-15 DIAGNOSIS — E1165 Type 2 diabetes mellitus with hyperglycemia: Secondary | ICD-10-CM | POA: Diagnosis not present

## 2023-08-15 DIAGNOSIS — E1129 Type 2 diabetes mellitus with other diabetic kidney complication: Secondary | ICD-10-CM | POA: Diagnosis not present

## 2023-08-15 DIAGNOSIS — R972 Elevated prostate specific antigen [PSA]: Secondary | ICD-10-CM | POA: Diagnosis not present

## 2023-08-22 DIAGNOSIS — R82998 Other abnormal findings in urine: Secondary | ICD-10-CM | POA: Diagnosis not present

## 2023-08-22 DIAGNOSIS — Z794 Long term (current) use of insulin: Secondary | ICD-10-CM | POA: Diagnosis not present

## 2023-08-22 DIAGNOSIS — E1165 Type 2 diabetes mellitus with hyperglycemia: Secondary | ICD-10-CM | POA: Diagnosis not present

## 2023-08-22 DIAGNOSIS — Z0000 Encounter for general adult medical examination without abnormal findings: Secondary | ICD-10-CM | POA: Diagnosis not present

## 2023-08-22 DIAGNOSIS — I255 Ischemic cardiomyopathy: Secondary | ICD-10-CM | POA: Diagnosis not present

## 2023-08-22 DIAGNOSIS — E1122 Type 2 diabetes mellitus with diabetic chronic kidney disease: Secondary | ICD-10-CM | POA: Diagnosis not present

## 2023-08-22 DIAGNOSIS — D126 Benign neoplasm of colon, unspecified: Secondary | ICD-10-CM | POA: Diagnosis not present

## 2023-08-22 DIAGNOSIS — Z1339 Encounter for screening examination for other mental health and behavioral disorders: Secondary | ICD-10-CM | POA: Diagnosis not present

## 2023-08-22 DIAGNOSIS — E7800 Pure hypercholesterolemia, unspecified: Secondary | ICD-10-CM | POA: Diagnosis not present

## 2023-08-22 DIAGNOSIS — N1832 Chronic kidney disease, stage 3b: Secondary | ICD-10-CM | POA: Diagnosis not present

## 2023-08-22 DIAGNOSIS — Z87442 Personal history of urinary calculi: Secondary | ICD-10-CM | POA: Diagnosis not present

## 2023-08-22 DIAGNOSIS — Z9981 Dependence on supplemental oxygen: Secondary | ICD-10-CM | POA: Diagnosis not present

## 2023-08-22 DIAGNOSIS — Z1331 Encounter for screening for depression: Secondary | ICD-10-CM | POA: Diagnosis not present

## 2023-08-22 DIAGNOSIS — E66811 Obesity, class 1: Secondary | ICD-10-CM | POA: Diagnosis not present

## 2023-08-22 DIAGNOSIS — I25810 Atherosclerosis of coronary artery bypass graft(s) without angina pectoris: Secondary | ICD-10-CM | POA: Diagnosis not present

## 2023-08-22 DIAGNOSIS — R972 Elevated prostate specific antigen [PSA]: Secondary | ICD-10-CM | POA: Diagnosis not present

## 2023-08-22 DIAGNOSIS — I129 Hypertensive chronic kidney disease with stage 1 through stage 4 chronic kidney disease, or unspecified chronic kidney disease: Secondary | ICD-10-CM | POA: Diagnosis not present

## 2023-08-22 DIAGNOSIS — I8390 Asymptomatic varicose veins of unspecified lower extremity: Secondary | ICD-10-CM | POA: Diagnosis not present

## 2023-09-08 DIAGNOSIS — E1165 Type 2 diabetes mellitus with hyperglycemia: Secondary | ICD-10-CM | POA: Diagnosis not present

## 2023-09-28 DIAGNOSIS — H25013 Cortical age-related cataract, bilateral: Secondary | ICD-10-CM | POA: Diagnosis not present

## 2023-09-28 DIAGNOSIS — E113291 Type 2 diabetes mellitus with mild nonproliferative diabetic retinopathy without macular edema, right eye: Secondary | ICD-10-CM | POA: Diagnosis not present

## 2023-09-28 DIAGNOSIS — H5203 Hypermetropia, bilateral: Secondary | ICD-10-CM | POA: Diagnosis not present

## 2023-09-28 DIAGNOSIS — H2513 Age-related nuclear cataract, bilateral: Secondary | ICD-10-CM | POA: Diagnosis not present

## 2023-10-09 DIAGNOSIS — E1165 Type 2 diabetes mellitus with hyperglycemia: Secondary | ICD-10-CM | POA: Diagnosis not present

## 2023-10-25 DIAGNOSIS — L578 Other skin changes due to chronic exposure to nonionizing radiation: Secondary | ICD-10-CM | POA: Diagnosis not present

## 2023-10-25 DIAGNOSIS — Z872 Personal history of diseases of the skin and subcutaneous tissue: Secondary | ICD-10-CM | POA: Diagnosis not present

## 2023-10-25 DIAGNOSIS — L570 Actinic keratosis: Secondary | ICD-10-CM | POA: Diagnosis not present

## 2023-11-09 DIAGNOSIS — E1165 Type 2 diabetes mellitus with hyperglycemia: Secondary | ICD-10-CM | POA: Diagnosis not present

## 2023-12-09 DIAGNOSIS — E1165 Type 2 diabetes mellitus with hyperglycemia: Secondary | ICD-10-CM | POA: Diagnosis not present

## 2023-12-11 DIAGNOSIS — I130 Hypertensive heart and chronic kidney disease with heart failure and stage 1 through stage 4 chronic kidney disease, or unspecified chronic kidney disease: Secondary | ICD-10-CM | POA: Diagnosis not present

## 2023-12-11 DIAGNOSIS — Z23 Encounter for immunization: Secondary | ICD-10-CM | POA: Diagnosis not present

## 2023-12-11 DIAGNOSIS — E1129 Type 2 diabetes mellitus with other diabetic kidney complication: Secondary | ICD-10-CM | POA: Diagnosis not present

## 2023-12-11 DIAGNOSIS — Z794 Long term (current) use of insulin: Secondary | ICD-10-CM | POA: Diagnosis not present

## 2023-12-11 DIAGNOSIS — I25810 Atherosclerosis of coronary artery bypass graft(s) without angina pectoris: Secondary | ICD-10-CM | POA: Diagnosis not present

## 2023-12-11 DIAGNOSIS — N1832 Chronic kidney disease, stage 3b: Secondary | ICD-10-CM | POA: Diagnosis not present

## 2023-12-24 DIAGNOSIS — L570 Actinic keratosis: Secondary | ICD-10-CM | POA: Diagnosis not present

## 2024-01-09 DIAGNOSIS — E1165 Type 2 diabetes mellitus with hyperglycemia: Secondary | ICD-10-CM | POA: Diagnosis not present

## 2024-01-21 DIAGNOSIS — L570 Actinic keratosis: Secondary | ICD-10-CM | POA: Diagnosis not present

## 2024-03-06 ENCOUNTER — Other Ambulatory Visit: Payer: Self-pay | Admitting: Urology

## 2024-03-06 DIAGNOSIS — R972 Elevated prostate specific antigen [PSA]: Secondary | ICD-10-CM

## 2024-03-10 NOTE — Progress Notes (Unsigned)
 " Cardiology Office Note:  .   Date:  03/10/2024  ID:  [name], DOB [birth year], MRN [id number] PCP: Vernadine Charlie ORN, MD  [facility] Providers Cardiologist:  Alm Clay, MD Electrophysiologist:  Will Gladis Norton, MD { Click to update primary MD,subspecialty MD or APP then REFRESH:1}    No chief complaint on file.  No problems updated.   Patient Profile: .     Taylor [name] is a 74 y.o. male with a PMH notable for  MV- CAD (CABG), ~ ICM, PVCs, HTN. HLD, DM-2 who presents here for 18-month follow-up.  He was originally referred at the request of Tisovec, Richard W, MD in June 2019 with complaints of ventricular bigeminy but noted exertional dyspnea concerning for anginal equivalent.  Increased concern due to strong family history of CAD.  MV CAD: June 2019:--> Sx: DOE/Angina (waling up hill) ->  High Risk Myoview  with anterior ischemia--Cath w/ MV CAD--> CABGx4 -  LIMA-LAD, SVG-OM2, SeqSVG-PDA-RPL2 (July 2019).  -> Postop monitor showed PSVT (initially considered to be A. 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  No active symptoms.  BPN lipids controlled.  Atorvastatin  refilled.  A1c was 6.9 on combination of Lantus, Jardiance  25 mg, metformin  500 twice daily and Ozempic.  Negative CV ROS.  No sensation of palpitations      Subjective  Discussed the use of AI scribe software for clinical note transcription with the patient, who gave verbal consent to proceed.  History of Present Illness      Cardiovascular ROS: {roscv:310661}  ROS:  Review of Systems - {ros master:310782}    Objective   Medications - Toprol  25 mg twice a day; - Lisinopril  10 mg - Aspirin  81 mg; - Atorvastatin  80 mg - Lantus 15 Units at bedtime;- Jardiance  25 mg  daily; -metformin  500 mg twice a day; - Ozempic 4mg /55ml - 1mg  weekly  Social History: He had transferred jobs to [employer], which he describes as a high-stress environment. He works four days a week from 7 AM to 3:30 PM and remains active outside of work.   Studies Reviewed: .         Results  Echocardiogram (11/17/2019): EF 50 to 55%.  Low normal function with no RWMA.  Moderate LA dilation.  Normal mitral valve.  Mild to moderate AoV sclerosis/calcification with no stenosis.  Normal RV size and function with normal RVSP and RAP Cardiac Cath (09/10/2017): Dominant RCA: mRCA 70%, dRCA 99%-> 95% PAV, RPL1 70% & ost PDA 90%; LCA: Ost-pLAD 40%, pLAD 60% @ Ost D1 90% & p-mLAD 95%@ 70% ost D2 (p D1 70%); p LCx 80%,  p-m LCx 90% & mLCx 85%. LVEF ~45-50%.  Referred to CVTS for CABG x 4 (LIMA-LAD, SVG-OM 2, SeqSVG-PDA-RPL 2)   30-day MONITOR (09/15/2017): Sinus rhythm frequent (12%) PVCs and rare PACs.  Short burst of NSVT (4-5 beats).  Several short runs of atrial tachycardia and 3-4 runs of SVT (reviewed with EP-agree with AVNRT)  Risk Assessment/Calculations:     No BP recorded.  {Refresh Note OR Click here to enter BP  :1}***         Physical Exam:   VS:  There were no vitals taken for this visit.   Wt Readings  from Last 3 Encounters:  05/21/23 217 lb (98.4 kg)  12/23/21 214 lb 9.6 oz (97.3 kg)  03/18/20 214 lb 6.4 oz (97.3 kg)    Physical Exam    GEN: Well nourished, well groomed; in no acute distress; borderline obese but otherwise healthy-appearing NECK: No JVD; No carotid bruits CARDIAC: Normal S1, S2; RRR, no murmurs, rubs, gallops RESPIRATORY:  Clear to auscultation without rales, wheezing or rhonchi ; nonlabored, good air movement. ABDOMEN: Soft, non-tender, non-distended EXTREMITIES:  No edema; No deformity      ASSESSMENT AND PLAN: .   No problem-specific Assessment & Plan notes found for this encounter.   No orders of the defined types were placed in  this encounter.   No orders of the defined types were placed in this encounter.  Assessment and Plan Assessment & Plan         {Are you ordering a CV Procedure (e.g. stress test, cath, DCCV, TEE, etc)?   Press F2        :[id number]}   Follow-Up: No follow-ups on file.  I spent *** minutes in the care of CARTEZ MOGLE today including {CHL AMB CAR Time Based Billing Options STW (Optional):[phone]::documenting in the encounter.}   Portions of this note were dictated using DRAGON voice recognition software. Please disregard any errors in transcription. This record has been created using Conservation officer, historic buildings. Errors have been sought and corrected, but may not always be located. Such creation errors do not reflect on the standard of medical care.    Signed, Alm MICAEL Clay, MD, MS Alm Clay, M.D., M.S. Interventional Cardiologist  [facility] Pager # [phone]      "

## 2024-03-11 ENCOUNTER — Ambulatory Visit: Admitting: Cardiology

## 2024-03-11 ENCOUNTER — Ambulatory Visit: Attending: Cardiology | Admitting: Cardiology

## 2024-03-11 ENCOUNTER — Encounter: Payer: Self-pay | Admitting: Cardiology

## 2024-03-11 VITALS — BP 134/74 | HR 70 | Ht 71.0 in | Wt 206.6 lb

## 2024-03-11 DIAGNOSIS — Z951 Presence of aortocoronary bypass graft: Secondary | ICD-10-CM

## 2024-03-11 DIAGNOSIS — E785 Hyperlipidemia, unspecified: Secondary | ICD-10-CM | POA: Diagnosis not present

## 2024-03-11 DIAGNOSIS — I2510 Atherosclerotic heart disease of native coronary artery without angina pectoris: Secondary | ICD-10-CM | POA: Diagnosis not present

## 2024-03-11 DIAGNOSIS — I255 Ischemic cardiomyopathy: Secondary | ICD-10-CM

## 2024-03-11 DIAGNOSIS — I471 Supraventricular tachycardia, unspecified: Secondary | ICD-10-CM

## 2024-03-11 DIAGNOSIS — I10 Essential (primary) hypertension: Secondary | ICD-10-CM

## 2024-03-11 DIAGNOSIS — I498 Other specified cardiac arrhythmias: Secondary | ICD-10-CM

## 2024-03-11 DIAGNOSIS — E1169 Type 2 diabetes mellitus with other specified complication: Secondary | ICD-10-CM | POA: Diagnosis not present

## 2024-03-11 NOTE — Assessment & Plan Note (Signed)
 Resolved cardiomyopathy with follow-up echocardiogram showing EF 50 to 55% with no RWMA's and postop septal wall motion.

## 2024-03-11 NOTE — Assessment & Plan Note (Signed)
 Hyperlipidemia-most recent lipid panel satisfactory.-LDL 55. Well-controlled with atorvastatin .  - Continue atorvastatin  80 mg.   Type 2 diabetes mellitus - Well-controlled with A1c of 6.5%. - Continue Lantus 20 units, Jardiance  25 mg, metformin  500 mg twice daily, Ozempic 1 mg weekly.

## 2024-03-11 NOTE — Assessment & Plan Note (Signed)
 Premature ventricular contractions with bigeminy Well-controlled with Toprol . - Continue Toprol  25 mg.

## 2024-03-11 NOTE — Patient Instructions (Signed)
 Medication Instructions:  NO CHANGES   *If you need a refill on your cardiac medications before your next appointment, please call your pharmacy*   Lab Work: NOT NEEDED   Testing/Procedures: NOT NEEDED   Follow-Up: At [facility], you and your health needs are our priority.  As part of our continuing mission to provide you with exceptional heart care, we have created designated Provider Care Teams.  These Care Teams include your primary Cardiologist (physician) and Advanced Practice Providers (APPs -  Physician Assistants and Nurse Practitioners) who all work together to provide you with the care you need, when you need it.     Your next appointment:   1 year(s)  The format for your next appointment:   In Person  Provider:   Alm Clay, MD

## 2024-03-11 NOTE — Assessment & Plan Note (Addendum)
 6-1/2 years out from CABG.  Doing very well.  He is very active physically and has not had any symptoms.  Echocardiogram satisfactory. No stress test due to lack of symptoms => will discuss surveillance stress testing at next visit. - Consider surveillance stress test 8-10 years post-bypass if symptoms develop

## 2024-03-11 NOTE — Assessment & Plan Note (Signed)
 Coronary artery disease well-managed without angina or heart failure symptoms.  Echocardiogram satisfactory. No stress test due to lack of symptoms => will discuss surveillance stress testing at next visit. - Continue aspirin  81 mg, atorvastatin  80 mg, lisinopril  10 mg, Toprol  25 mg. - Consider surveillance stress test 8-10 years post-bypass if symptoms develop.

## 2024-03-11 NOTE — Assessment & Plan Note (Signed)
 Well-controlled with current regimen. Blood pressure satisfactory. - Continue lisinopril  10 mg, Toprol  25 mg.

## 2024-03-11 NOTE — Assessment & Plan Note (Signed)
 No further episodes of heart rates or palpitations. - Continue low-dose Toprol  XL 25 mg daily

## 2024-03-11 NOTE — Progress Notes (Signed)
 " Cardiology Office Note:  .   Date:  03/11/2024  ID:  Taylor [name], DOB 04/06/1950, MRN [id number] PCP: Vernadine Charlie ORN, MD  [facility] Providers Cardiologist:  Alm Clay, MD Electrophysiologist:  Will Gladis Norton, MD     Chief Complaint  Patient presents with   Follow-up    Annual follow-up.  Doing well.  No palpitations   Coronary Artery Disease    No angina    Patient Profile: .     Taylor [name] is a very pleasant 74 y.o. male with a PMH notable for MV- CAD (CABG), ~ ICM, PVCs, HTN. HLD, DM-2  who presents here for annual f/u at the request of Tisovec, Charlie ORN, MD.  Taylor [name] is a 74 y.o. male with a PMH notable for    He was originally referred at the request of Tisovec, Charlie ORN, MD in June 2019 with complaints of ventricular bigeminy but noted exertional dyspnea concerning for anginal equivalent.  Increased concern due to strong family history of CAD.  MV CAD: June 2019:--> Sx: DOE/Angina (waling up hill) ->  High Risk Myoview  with anterior ischemia--Cath w/ MV CAD--> CABGx4 -  LIMA-LAD, SVG-OM2, SeqSVG-PDA-RPL2 (July 2019).  -> Postop monitor showed PSVT (initially considered to be A. 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  No active symptoms.  BPN lipids controlled.  Atorvastatin  refilled.  A1c was 6.9 on combination of Lantus, Jardiance  25 mg, metformin  500 twice daily and Ozempic.  Negative CV ROS.  No sensation of palpitations    Subjective  Discussed the use of AI scribe software for clinical note transcription with the patient, who gave verbal consent to proceed. History of Present Illness Taylor [name] is a 74 year old male with coronary artery disease and diabetes who presents for a routine follow-up visit.  No  current issues with chest pain, pressure, or tightness. No shortness of breath, heart racing, or palpitations. No episodes of skipping or racing heartbeats in the past two to three years. He is taking aspirin  81 mg, atorvastatin  80 mg, lisinopril  10 mg, and Toprol  25 mg for coronary artery disease.  For diabetes management, he is on Lantus 20 units, Jardiance  25 mg, metformin  500 mg twice a day, and Ozempic 1 mg once a week. His hemoglobin A1c is 6.5. He reports stable weight with no significant weight loss.  His prostate-specific antigen level was slightly elevated at 4.3, and he has undergone an MRI. He is awaiting further evaluation post-MRI. No issues with urine stream.  No swelling in legs, blood in stool, or blood in urine. No muscle cramping or pain in calves or thighs when walking.  He recalls a past hospitalization for COVID pneumonia in 2019, during which he was treated with oxygen  therapy and insulin  injections.  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   Cardiovascular ROS: no chest pain or dyspnea on exertion negative for - edema, irregular heartbeat, orthopnea, palpitations, paroxysmal nocturnal dyspnea, rapid heart rate, shortness of breath, or syncope/near syncope, TIA/amaurosis fugax; melena, hematochezia, hematuria, epistaxis; claudication    Objective   Active Medications[1]   Studies Reviewed: SABRA   EKG Interpretation Date/Time:  Tuesday March 11 2024 09:04:38 EST Ventricular Rate:  69 PR Interval:  162 QRS Duration:  94 QT Interval:  422 QTC Calculation: 452 R Axis:   9  Text Interpretation: Sinus rhythm with sinus arrhythmia with occasional Premature ventricular complexes Cannot rule out Anterior infarct , age undetermined When compared with ECG of 21-May-2023 11:39, No significant change was found Confirmed by Anner Lenis ([id number]) on 03/11/2024 9:16:11 AM    Results   ECHO 11/17/2019: EF estimated 50 to 55% with abnormal  septal motion but otherwise no RWMA.  Mild LVH, indeterminate diastolic parameters.  Moderate LA dilation.  Normal RV.  Normal RVP RAP CATH 09/03/2017 (for Abnormal Myoview /Exertional Angina): Ost-prox LAD 40%, prox LAD 60% @ D1 w/ ost 90%, prox-mid LAD 95% @ D2 w/ ost 70%; Prox LCX 80%, prox-mid LCx 90% & mid LCx 85%; Mid RCA 70%, dist RCA 99%-< ostRPDA 90% & 95% ost PAV, RPL1 70% Referred for CABG x 4: LIMA-LAD, SVG-OM2, SeqSVG-PDA-RPL2 (    Labs Total cholesterol (08/15/2023): 100 HDL (08/15/2023): 36 LDL (92/97/7974): 55 Triglycerides (08/15/2023): 45 A1c (08/15/2023): 6.5  Diagnostic EKG (03/11/2024): Isolated premature ventricular contraction; otherwise normal tracing (Independently interpreted)   Risk Assessment/Calculations:               Physical Exam:   VS:  BP 134/74   Pulse 70   Ht 5' 11 (1.803 m)   Wt 206 lb 9.6 oz (93.7 kg)   SpO2 96%   BMI 28.81 kg/m    Wt Readings from Last 3 Encounters:  03/11/24 206 lb 9.6 oz (93.7 kg)  05/21/23 217 lb (98.4 kg)  12/23/21 214 lb 9.6 oz (97.3 kg)      GEN: Healthy appearing.  Well nourished, well groomed. In no acute distress;  NECK: No JVD; No carotid bruits CARDIAC: Normal S1, S2; RRR, no murmurs, rubs, gallops RESPIRATORY:  Clear to auscultation without rales, wheezing or rhonchi ; nonlabored, good air movement. ABDOMEN: Soft, non-tender, non-distended EXTREMITIES:  No edema; No deformity      ASSESSMENT AND PLAN: .   Coronary artery disease involving native coronary artery of native heart without angina pectoris Coronary artery disease well-managed without angina or heart failure symptoms.  Echocardiogram satisfactory. No stress test due to lack of symptoms => will discuss surveillance stress testing at next visit. - Continue aspirin  81 mg, atorvastatin  80 mg, lisinopril  10 mg, Toprol  25 mg. - Consider surveillance stress test 8-10 years post-bypass if symptoms develop.  Ventricular bigeminy Premature  ventricular contractions with bigeminy Well-controlled with Toprol . - Continue Toprol  25 mg.  Ischemic cardiomyopathy - pre-CABG ECHO: 40-45% Resolved cardiomyopathy with follow-up echocardiogram showing EF 50 to 55% with no RWMA's and postop septal wall motion.  PSVT (paroxysmal supraventricular tachycardia) No further episodes of heart rates or palpitations. - Continue low-dose Toprol  XL 25 mg daily  Essential hypertension Well-controlled with current regimen. Blood pressure satisfactory. - Continue lisinopril  10 mg, Toprol  25 mg.  Hyperlipidemia associated with type 2 diabetes mellitus (HCC) Hyperlipidemia-most recent lipid panel satisfactory.-LDL 55. Well-controlled with atorvastatin .  - Continue atorvastatin  80 mg.   Type 2 diabetes mellitus - Well-controlled with A1c of 6.5%. - Continue Lantus 20 units, Jardiance  25 mg, metformin  500 mg twice daily, Ozempic 1 mg weekly.  S/P CABG x 4 6-1/2 years out from CABG.  Doing very well.  He is very active physically and has not had any symptoms.  Echocardiogram satisfactory. No stress test due to lack of symptoms => will discuss surveillance stress testing at next visit. - Consider surveillance stress  test 8-10 years post-bypass if symptoms develop   Orders Placed This Encounter  Procedures   EKG 12-Lead    No orders of the defined types were placed in this encounter.       Follow-Up: Return in about 1 year (around 03/11/2025) for Routine follow up with me, [employer].  Portions of this note were dictated using DRAGON voice recognition software. Please disregard any errors in transcription. This record has been created using Conservation officer, historic buildings. Errors have been sought and corrected, but may not always be located. Such creation errors do not reflect on the standard of medical care.      Signed, Alm MICAEL Clay, MD, MS Alm Clay, M.D., M.S. Interventional Cardiologist  [facility] Pager # [phone]          [1]  Current Meds  Medication Sig   aspirin  EC 81 MG tablet Take 1 tablet (81 mg total) by mouth daily.   atorvastatin  (LIPITOR ) 80 MG tablet Take 1 tablet (80 mg total) by mouth daily.   BD PEN NEEDLE NANO 2ND GEN 32G X 4 MM MISC AS DIRECTED . WITH LANTUS DAILY 90 DAYS   Continuous Glucose Sensor (FREESTYLE LIBRE 3 PLUS SENSOR) MISC as directed to monitor blood glucose continously   insulin  glargine (LANTUS) 100 UNIT/ML injection Inject 15 Units into the skin daily. (Patient taking differently: Inject 20 Units into the skin daily.)   JARDIANCE  25 MG TABS tablet Take 1 tablet (25 mg total) by mouth daily.   ketoconazole  (NIZORAL ) 2 % cream Apply to skin of feet for athletes foot   Lancets (ONETOUCH DELICA PLUS LANCET30G) MISC USE TO TEST BLOOD SUGARS TWICE DAILY 90   lisinopril  (ZESTRIL ) 10 MG tablet Take 10 mg by mouth daily.   metFORMIN  (GLUCOPHAGE ) 500 MG tablet Take 500 mg by mouth 2 (two) times daily.   metoprolol  succinate (TOPROL -XL) 25 MG 24 hr tablet TAKE 1 TABLET BY MOUTH TWICE A DAY   Multiple Vitamin (MULTIVITAMIN) tablet Take 1 tablet by mouth daily.   ONETOUCH VERIO test strip 2 (two) times daily.   OZEMPIC, 1 MG/DOSE, 4 MG/3ML SOPN Inject 1 mg into the skin once a week.   terbinafine  (LAMISIL ) 250 MG tablet Take 1 tablet (250 mg total) by mouth daily.   triamcinolone  ointment (KENALOG) 0.1 % Apply topically 2 (two) times daily as needed.   "

## 2024-04-15 ENCOUNTER — Other Ambulatory Visit
